# Patient Record
Sex: Female | Born: 1935
Health system: Southern US, Community
[De-identification: ages and names within clinical notes are randomized; demographics above are authoritative.]

## PROBLEM LIST (undated history)

## (undated) DIAGNOSIS — I10 Essential (primary) hypertension: Secondary | ICD-10-CM

## (undated) DIAGNOSIS — H269 Unspecified cataract: Secondary | ICD-10-CM

## (undated) DIAGNOSIS — J449 Chronic obstructive pulmonary disease, unspecified: Secondary | ICD-10-CM

## (undated) DIAGNOSIS — N904 Leukoplakia of vulva: Secondary | ICD-10-CM

## (undated) DIAGNOSIS — I82409 Acute embolism and thrombosis of unspecified deep veins of unspecified lower extremity: Secondary | ICD-10-CM

## (undated) DIAGNOSIS — C801 Malignant (primary) neoplasm, unspecified: Secondary | ICD-10-CM

## (undated) DIAGNOSIS — N183 Chronic kidney disease, stage 3 unspecified: Secondary | ICD-10-CM

## (undated) DIAGNOSIS — M1990 Unspecified osteoarthritis, unspecified site: Secondary | ICD-10-CM

## (undated) DIAGNOSIS — M353 Polymyalgia rheumatica: Secondary | ICD-10-CM

## (undated) DIAGNOSIS — C649 Malignant neoplasm of unspecified kidney, except renal pelvis: Secondary | ICD-10-CM

## (undated) DIAGNOSIS — F32A Depression, unspecified: Secondary | ICD-10-CM

## (undated) DIAGNOSIS — E559 Vitamin D deficiency, unspecified: Secondary | ICD-10-CM

## (undated) DIAGNOSIS — G629 Polyneuropathy, unspecified: Secondary | ICD-10-CM

## (undated) DIAGNOSIS — M8580 Other specified disorders of bone density and structure, unspecified site: Secondary | ICD-10-CM

## (undated) DIAGNOSIS — E785 Hyperlipidemia, unspecified: Secondary | ICD-10-CM

## (undated) DIAGNOSIS — G2581 Restless legs syndrome: Secondary | ICD-10-CM

## (undated) DIAGNOSIS — E119 Type 2 diabetes mellitus without complications: Secondary | ICD-10-CM

## (undated) DIAGNOSIS — F329 Major depressive disorder, single episode, unspecified: Secondary | ICD-10-CM

## (undated) DIAGNOSIS — L508 Other urticaria: Secondary | ICD-10-CM

## (undated) DIAGNOSIS — N903 Dysplasia of vulva, unspecified: Secondary | ICD-10-CM

## (undated) DIAGNOSIS — N289 Disorder of kidney and ureter, unspecified: Secondary | ICD-10-CM

## (undated) HISTORY — PX: NEPHRECTOMY: SHX65

## (undated) HISTORY — PX: ABDOMINAL HYSTERECTOMY: SHX81

## (undated) HISTORY — PX: JOINT REPLACEMENT: SHX530

## (undated) HISTORY — PX: CATARACT EXTRACTION: SUR2

## (undated) HISTORY — PX: LAMINOTOMY: SHX998

## (undated) HISTORY — PX: COLONOSCOPY: SHX174

## (undated) HISTORY — PX: TONSILLECTOMY: SUR1361

## (undated) HISTORY — PX: TEMPORAL ARTERY BIOPSY / LIGATION: SUR132

## (undated) HISTORY — PX: APPENDECTOMY: SHX54

---

## 2004-07-22 ENCOUNTER — Other Ambulatory Visit: Payer: Self-pay

## 2004-09-10 ENCOUNTER — Ambulatory Visit: Payer: Self-pay | Admitting: Oncology

## 2004-09-12 ENCOUNTER — Ambulatory Visit: Payer: Self-pay | Admitting: Oncology

## 2005-01-10 ENCOUNTER — Ambulatory Visit: Payer: Self-pay | Admitting: Internal Medicine

## 2005-03-13 ENCOUNTER — Ambulatory Visit: Payer: Self-pay | Admitting: Oncology

## 2005-06-04 ENCOUNTER — Emergency Department: Payer: Self-pay | Admitting: Internal Medicine

## 2005-09-11 ENCOUNTER — Ambulatory Visit: Payer: Self-pay | Admitting: Oncology

## 2005-09-25 ENCOUNTER — Ambulatory Visit: Payer: Self-pay | Admitting: Oncology

## 2006-09-08 ENCOUNTER — Ambulatory Visit: Payer: Self-pay | Admitting: Oncology

## 2006-09-16 ENCOUNTER — Ambulatory Visit: Payer: Self-pay | Admitting: Unknown Physician Specialty

## 2006-09-25 ENCOUNTER — Ambulatory Visit: Payer: Self-pay | Admitting: Oncology

## 2006-11-27 ENCOUNTER — Ambulatory Visit: Payer: Self-pay | Admitting: Internal Medicine

## 2007-02-17 ENCOUNTER — Ambulatory Visit: Payer: Self-pay | Admitting: Unknown Physician Specialty

## 2007-02-24 ENCOUNTER — Inpatient Hospital Stay: Payer: Self-pay | Admitting: Unknown Physician Specialty

## 2008-01-07 ENCOUNTER — Ambulatory Visit: Payer: Self-pay | Admitting: Internal Medicine

## 2009-01-09 ENCOUNTER — Ambulatory Visit: Payer: Self-pay | Admitting: Internal Medicine

## 2009-07-03 ENCOUNTER — Encounter: Payer: Self-pay | Admitting: Rheumatology

## 2009-07-26 ENCOUNTER — Encounter: Payer: Self-pay | Admitting: Rheumatology

## 2009-10-09 ENCOUNTER — Ambulatory Visit: Payer: Self-pay | Admitting: Unknown Physician Specialty

## 2009-10-12 ENCOUNTER — Ambulatory Visit: Payer: Self-pay | Admitting: Unknown Physician Specialty

## 2010-01-03 ENCOUNTER — Ambulatory Visit: Payer: Self-pay | Admitting: Anesthesiology

## 2010-01-12 ENCOUNTER — Ambulatory Visit: Payer: Self-pay | Admitting: Anesthesiology

## 2010-02-16 ENCOUNTER — Ambulatory Visit: Payer: Self-pay | Admitting: Anesthesiology

## 2010-03-28 ENCOUNTER — Ambulatory Visit: Payer: Self-pay | Admitting: Anesthesiology

## 2010-04-03 ENCOUNTER — Ambulatory Visit: Payer: Self-pay | Admitting: Internal Medicine

## 2010-08-31 ENCOUNTER — Ambulatory Visit: Payer: Self-pay | Admitting: Neurology

## 2010-09-19 ENCOUNTER — Inpatient Hospital Stay: Payer: Self-pay | Admitting: Internal Medicine

## 2010-10-02 ENCOUNTER — Ambulatory Visit: Payer: Self-pay | Admitting: Internal Medicine

## 2010-10-31 ENCOUNTER — Ambulatory Visit: Payer: Self-pay | Admitting: Specialist

## 2010-11-23 ENCOUNTER — Emergency Department: Payer: Self-pay | Admitting: Emergency Medicine

## 2010-11-27 ENCOUNTER — Ambulatory Visit: Payer: Self-pay | Admitting: Internal Medicine

## 2011-01-31 ENCOUNTER — Ambulatory Visit: Payer: Self-pay | Admitting: Internal Medicine

## 2011-06-26 ENCOUNTER — Ambulatory Visit: Payer: Self-pay | Admitting: Oncology

## 2011-07-17 ENCOUNTER — Ambulatory Visit: Payer: Self-pay | Admitting: Oncology

## 2011-07-27 ENCOUNTER — Ambulatory Visit: Payer: Self-pay | Admitting: Oncology

## 2011-08-05 ENCOUNTER — Ambulatory Visit (INDEPENDENT_AMBULATORY_CARE_PROVIDER_SITE_OTHER): Payer: Medicare Other | Admitting: Ophthalmology

## 2011-08-05 DIAGNOSIS — H3530 Unspecified macular degeneration: Secondary | ICD-10-CM

## 2011-08-05 DIAGNOSIS — H43819 Vitreous degeneration, unspecified eye: Secondary | ICD-10-CM

## 2011-08-05 DIAGNOSIS — H2510 Age-related nuclear cataract, unspecified eye: Secondary | ICD-10-CM

## 2011-08-05 DIAGNOSIS — H348392 Tributary (branch) retinal vein occlusion, unspecified eye, stable: Secondary | ICD-10-CM

## 2011-11-25 ENCOUNTER — Ambulatory Visit: Payer: Self-pay | Admitting: Unknown Physician Specialty

## 2011-12-09 ENCOUNTER — Inpatient Hospital Stay: Payer: Self-pay | Admitting: Unknown Physician Specialty

## 2011-12-09 LAB — HEMOGLOBIN: HGB: 11 g/dL — ABNORMAL LOW (ref 12.0–16.0)

## 2011-12-10 LAB — BASIC METABOLIC PANEL
Anion Gap: 9 (ref 7–16)
BUN: 18 mg/dL (ref 7–18)
Chloride: 106 mmol/L (ref 98–107)
Co2: 26 mmol/L (ref 21–32)
Creatinine: 1.23 mg/dL (ref 0.60–1.30)
EGFR (Non-African Amer.): 45 — ABNORMAL LOW
Glucose: 147 mg/dL — ABNORMAL HIGH (ref 65–99)
Potassium: 4.2 mmol/L (ref 3.5–5.1)
Sodium: 141 mmol/L (ref 136–145)

## 2011-12-10 LAB — HEMOGLOBIN: HGB: 9.3 g/dL — ABNORMAL LOW (ref 12.0–16.0)

## 2011-12-11 LAB — PATHOLOGY REPORT

## 2011-12-11 LAB — HEMOGLOBIN: HGB: 8.3 g/dL — ABNORMAL LOW (ref 12.0–16.0)

## 2011-12-11 LAB — PROTIME-INR
INR: 1.1
Prothrombin Time: 15 secs — ABNORMAL HIGH (ref 11.5–14.7)

## 2011-12-12 LAB — CBC WITH DIFFERENTIAL/PLATELET
Basophil #: 0.2 10*3/uL — ABNORMAL HIGH (ref 0.0–0.1)
Eosinophil #: 0.1 10*3/uL (ref 0.0–0.7)
HGB: 8.3 g/dL — ABNORMAL LOW (ref 12.0–16.0)
Lymphocyte #: 2.9 10*3/uL (ref 1.0–3.6)
Lymphocyte %: 20.1 %
MCH: 29.7 pg (ref 26.0–34.0)
MCV: 92 fL (ref 80–100)
Monocyte #: 1.3 10*3/uL — ABNORMAL HIGH (ref 0.0–0.7)
Monocyte %: 8.6 %
Neutrophil #: 10.1 10*3/uL — ABNORMAL HIGH (ref 1.4–6.5)
Neutrophil %: 69.2 %
Platelet: 179 10*3/uL (ref 150–440)
RBC: 2.78 10*6/uL — ABNORMAL LOW (ref 3.80–5.20)
WBC: 14.6 10*3/uL — ABNORMAL HIGH (ref 3.6–11.0)

## 2011-12-12 LAB — URINALYSIS, COMPLETE
Bilirubin,UR: NEGATIVE
Ketone: NEGATIVE
Ph: 5 (ref 4.5–8.0)
RBC,UR: 10 /HPF (ref 0–5)
Specific Gravity: 1.009 (ref 1.003–1.030)
Squamous Epithelial: 3
WBC UR: 1 /HPF (ref 0–5)

## 2011-12-12 LAB — BASIC METABOLIC PANEL
BUN: 23 mg/dL — ABNORMAL HIGH (ref 7–18)
Calcium, Total: 8.7 mg/dL (ref 8.5–10.1)
Chloride: 98 mmol/L (ref 98–107)
Creatinine: 1.24 mg/dL (ref 0.60–1.30)
EGFR (African American): 54 — ABNORMAL LOW
EGFR (Non-African Amer.): 44 — ABNORMAL LOW
Glucose: 120 mg/dL — ABNORMAL HIGH (ref 65–99)
Osmolality: 279 (ref 275–301)
Potassium: 4.5 mmol/L (ref 3.5–5.1)
Sodium: 137 mmol/L (ref 136–145)

## 2011-12-12 LAB — PROTIME-INR: Prothrombin Time: 14 secs (ref 11.5–14.7)

## 2011-12-13 LAB — URINE CULTURE

## 2011-12-13 LAB — PROTIME-INR: Prothrombin Time: 14.1 secs (ref 11.5–14.7)

## 2011-12-14 LAB — BASIC METABOLIC PANEL
Calcium, Total: 9.5 mg/dL (ref 8.5–10.1)
Chloride: 95 mmol/L — ABNORMAL LOW (ref 98–107)
Co2: 35 mmol/L — ABNORMAL HIGH (ref 21–32)
EGFR (Non-African Amer.): 45 — ABNORMAL LOW
Glucose: 125 mg/dL — ABNORMAL HIGH (ref 65–99)
Osmolality: 287 (ref 275–301)
Potassium: 4.1 mmol/L (ref 3.5–5.1)
Sodium: 140 mmol/L (ref 136–145)

## 2011-12-14 LAB — CBC WITH DIFFERENTIAL/PLATELET
Basophil #: 0.1 10*3/uL (ref 0.0–0.1)
Eosinophil #: 0.2 10*3/uL (ref 0.0–0.7)
HCT: 23.8 % — ABNORMAL LOW (ref 35.0–47.0)
HGB: 7.8 g/dL — ABNORMAL LOW (ref 12.0–16.0)
Lymphocyte #: 1.8 10*3/uL (ref 1.0–3.6)
Lymphocyte %: 22.4 %
MCV: 91 fL (ref 80–100)
Monocyte %: 12 %
Neutrophil #: 4.9 10*3/uL (ref 1.4–6.5)
RBC: 2.61 10*6/uL — ABNORMAL LOW (ref 3.80–5.20)
RDW: 15.5 % — ABNORMAL HIGH (ref 11.5–14.5)
WBC: 7.9 10*3/uL (ref 3.6–11.0)

## 2011-12-14 LAB — PROTIME-INR
INR: 1.1
Prothrombin Time: 15 secs — ABNORMAL HIGH (ref 11.5–14.7)

## 2011-12-14 LAB — HEMOGLOBIN: HGB: 9.4 g/dL — ABNORMAL LOW (ref 12.0–16.0)

## 2011-12-16 LAB — BASIC METABOLIC PANEL
Anion Gap: 7 (ref 7–16)
Calcium, Total: 10.4 mg/dL — ABNORMAL HIGH (ref 8.5–10.1)
Co2: 36 mmol/L — ABNORMAL HIGH (ref 21–32)
EGFR (African American): 55 — ABNORMAL LOW
Potassium: 4 mmol/L (ref 3.5–5.1)
Sodium: 139 mmol/L (ref 136–145)

## 2011-12-16 LAB — PROTIME-INR
INR: 1.8
Prothrombin Time: 20.9 secs — ABNORMAL HIGH (ref 11.5–14.7)

## 2011-12-17 LAB — PROTIME-INR
INR: 2
Prothrombin Time: 23 secs — ABNORMAL HIGH (ref 11.5–14.7)

## 2012-01-13 ENCOUNTER — Ambulatory Visit: Payer: Self-pay | Admitting: Internal Medicine

## 2012-02-03 ENCOUNTER — Ambulatory Visit (INDEPENDENT_AMBULATORY_CARE_PROVIDER_SITE_OTHER): Payer: Medicare Other | Admitting: Ophthalmology

## 2013-07-31 ENCOUNTER — Emergency Department: Payer: Self-pay | Admitting: Emergency Medicine

## 2013-07-31 LAB — CBC
MCH: 29.6 pg (ref 26.0–34.0)
MCHC: 32.6 g/dL (ref 32.0–36.0)
MCV: 91 fL (ref 80–100)
Platelet: 267 10*3/uL (ref 150–440)
RDW: 16.4 % — ABNORMAL HIGH (ref 11.5–14.5)
WBC: 12.1 10*3/uL — ABNORMAL HIGH (ref 3.6–11.0)

## 2013-07-31 LAB — URINALYSIS, COMPLETE
Glucose,UR: 50 mg/dL (ref 0–75)
Ketone: NEGATIVE
Leukocyte Esterase: NEGATIVE
Ph: 7 (ref 4.5–8.0)
Protein: 100
RBC,UR: 7854 /HPF (ref 0–5)
Specific Gravity: 1.012 (ref 1.003–1.030)
Squamous Epithelial: NONE SEEN

## 2013-07-31 LAB — COMPREHENSIVE METABOLIC PANEL
Albumin: 3.3 g/dL — ABNORMAL LOW (ref 3.4–5.0)
Alkaline Phosphatase: 93 U/L (ref 50–136)
Anion Gap: 3 — ABNORMAL LOW (ref 7–16)
BUN: 22 mg/dL — ABNORMAL HIGH (ref 7–18)
Calcium, Total: 9.2 mg/dL (ref 8.5–10.1)
Chloride: 98 mmol/L (ref 98–107)
Creatinine: 1.26 mg/dL (ref 0.60–1.30)
EGFR (Non-African Amer.): 40 — ABNORMAL LOW
Glucose: 125 mg/dL — ABNORMAL HIGH (ref 65–99)
Total Protein: 7.3 g/dL (ref 6.4–8.2)

## 2013-08-21 ENCOUNTER — Ambulatory Visit: Payer: Self-pay | Admitting: Neurology

## 2013-10-18 ENCOUNTER — Ambulatory Visit: Payer: Self-pay | Admitting: Internal Medicine

## 2014-02-13 ENCOUNTER — Emergency Department: Payer: Self-pay | Admitting: Emergency Medicine

## 2014-03-24 ENCOUNTER — Ambulatory Visit: Payer: Self-pay | Admitting: Urology

## 2014-09-10 ENCOUNTER — Emergency Department: Payer: Self-pay | Admitting: Emergency Medicine

## 2014-09-10 LAB — BASIC METABOLIC PANEL
Anion Gap: 8 (ref 7–16)
BUN: 23 mg/dL — ABNORMAL HIGH (ref 7–18)
Calcium, Total: 9.2 mg/dL (ref 8.5–10.1)
Chloride: 99 mmol/L (ref 98–107)
Co2: 36 mmol/L — ABNORMAL HIGH (ref 21–32)
Creatinine: 1.38 mg/dL — ABNORMAL HIGH (ref 0.60–1.30)
EGFR (African American): 47 — ABNORMAL LOW
EGFR (Non-African Amer.): 39 — ABNORMAL LOW
GLUCOSE: 101 mg/dL — AB (ref 65–99)
Osmolality: 289 (ref 275–301)
Potassium: 3.6 mmol/L (ref 3.5–5.1)
Sodium: 143 mmol/L (ref 136–145)

## 2014-09-10 LAB — URINALYSIS, COMPLETE
Bilirubin,UR: NEGATIVE
Glucose,UR: NEGATIVE mg/dL (ref 0–75)
KETONE: NEGATIVE
Nitrite: POSITIVE
PH: 5 (ref 4.5–8.0)
PROTEIN: NEGATIVE
RBC,UR: 255 /HPF (ref 0–5)
SPECIFIC GRAVITY: 1.013 (ref 1.003–1.030)

## 2014-09-10 LAB — CBC WITH DIFFERENTIAL/PLATELET
BASOS PCT: 0.3 %
Basophil #: 0.1 10*3/uL (ref 0.0–0.1)
EOS ABS: 0.1 10*3/uL (ref 0.0–0.7)
Eosinophil %: 0.6 %
HCT: 36.7 % (ref 35.0–47.0)
HGB: 11.4 g/dL — AB (ref 12.0–16.0)
Lymphocyte #: 1.2 10*3/uL (ref 1.0–3.6)
Lymphocyte %: 7.8 %
MCH: 28.8 pg (ref 26.0–34.0)
MCHC: 31.1 g/dL — ABNORMAL LOW (ref 32.0–36.0)
MCV: 93 fL (ref 80–100)
MONO ABS: 0.9 x10 3/mm (ref 0.2–0.9)
Monocyte %: 6 %
Neutrophil #: 13.4 10*3/uL — ABNORMAL HIGH (ref 1.4–6.5)
Neutrophil %: 85.3 %
PLATELETS: 331 10*3/uL (ref 150–440)
RBC: 3.97 10*6/uL (ref 3.80–5.20)
RDW: 17.3 % — AB (ref 11.5–14.5)
WBC: 15.7 10*3/uL — AB (ref 3.6–11.0)

## 2014-09-12 LAB — URINE CULTURE

## 2014-12-08 DIAGNOSIS — R700 Elevated erythrocyte sedimentation rate: Secondary | ICD-10-CM | POA: Diagnosis not present

## 2015-01-02 DIAGNOSIS — N183 Chronic kidney disease, stage 3 (moderate): Secondary | ICD-10-CM | POA: Diagnosis not present

## 2015-01-02 DIAGNOSIS — E1122 Type 2 diabetes mellitus with diabetic chronic kidney disease: Secondary | ICD-10-CM | POA: Diagnosis not present

## 2015-01-02 DIAGNOSIS — F341 Dysthymic disorder: Secondary | ICD-10-CM | POA: Diagnosis not present

## 2015-01-02 DIAGNOSIS — I10 Essential (primary) hypertension: Secondary | ICD-10-CM | POA: Diagnosis not present

## 2015-01-12 DIAGNOSIS — Z79899 Other long term (current) drug therapy: Secondary | ICD-10-CM | POA: Diagnosis not present

## 2015-01-12 DIAGNOSIS — M25512 Pain in left shoulder: Secondary | ICD-10-CM | POA: Diagnosis not present

## 2015-01-12 DIAGNOSIS — N183 Chronic kidney disease, stage 3 (moderate): Secondary | ICD-10-CM | POA: Diagnosis not present

## 2015-01-12 DIAGNOSIS — E782 Mixed hyperlipidemia: Secondary | ICD-10-CM | POA: Diagnosis not present

## 2015-01-12 DIAGNOSIS — R700 Elevated erythrocyte sedimentation rate: Secondary | ICD-10-CM | POA: Diagnosis not present

## 2015-01-12 DIAGNOSIS — E559 Vitamin D deficiency, unspecified: Secondary | ICD-10-CM | POA: Diagnosis not present

## 2015-01-12 DIAGNOSIS — M1990 Unspecified osteoarthritis, unspecified site: Secondary | ICD-10-CM | POA: Diagnosis not present

## 2015-01-12 DIAGNOSIS — G8929 Other chronic pain: Secondary | ICD-10-CM | POA: Diagnosis not present

## 2015-01-12 DIAGNOSIS — E1122 Type 2 diabetes mellitus with diabetic chronic kidney disease: Secondary | ICD-10-CM | POA: Diagnosis not present

## 2015-01-17 DIAGNOSIS — M810 Age-related osteoporosis without current pathological fracture: Secondary | ICD-10-CM | POA: Diagnosis not present

## 2015-01-30 DIAGNOSIS — H3561 Retinal hemorrhage, right eye: Secondary | ICD-10-CM | POA: Diagnosis not present

## 2015-01-30 DIAGNOSIS — H3532 Exudative age-related macular degeneration: Secondary | ICD-10-CM | POA: Diagnosis not present

## 2015-01-30 DIAGNOSIS — E119 Type 2 diabetes mellitus without complications: Secondary | ICD-10-CM | POA: Diagnosis not present

## 2015-02-06 DIAGNOSIS — H3532 Exudative age-related macular degeneration: Secondary | ICD-10-CM | POA: Diagnosis not present

## 2015-02-14 DIAGNOSIS — R700 Elevated erythrocyte sedimentation rate: Secondary | ICD-10-CM | POA: Diagnosis not present

## 2015-03-19 NOTE — Consult Note (Signed)
PATIENT NAME:  Tracy Mcmahon, Tracy Mcmahon MR#:  [id number] DATE OF BIRTH:  1929/08/06  DATE OF CONSULTATION:  12/12/2011  REFERRING PHYSICIAN:  Kathrene Alu., MD   CONSULTING PHYSICIAN:  Cashtyn Pouliot A. Posey Pronto, MD  PRIMARY CARE PHYSICIAN: Christena Flake. Raechel Ache, MD    REASON FOR CONSULTATION: Fever and lethargy.   HISTORY OF PRESENT ILLNESS: Tracy Mcmahon is an 81 year old Caucasian female who was admitted on the Orthopedic Service on 12/09/2011 for an elective right hip replacement secondary to severe degenerative joint disease. Today is the patient's postoperative day three. She started having low-grade to high-grade fever since yesterday evening. She has been a little lethargic given her fever. Her chest x-ray is negative. She has been having some cough which is nonproductive. The patient denies any abdominal pain. The patient denies any abdominal pain or chest pain, denies any shortness of breath. She usually wears oxygen at home, suspected from her sleep apnea.   PAST MEDICAL/SURGICAL HISTORY:  1. History of polymyalgia rheumatica.  2. History of kidney cancer, status post left nephrectomy.  3. Hypertension.  4. Left total knee replacement in October 2011.  5. Left hip replacement.  6. Tonsillectomy.  7. Appendectomy. 8. Neck surgery  9. Total hysterectomy in 1974.  10. Hemorrhoidectomy.  11. History of depression.   ALLERGIES: Codeine, morphine, and novocaine.   FAMILY HISTORY: Positive for hypertension.    CURRENT MEDICATIONS:  1. Norco 5/325, 1 p.o. every 3 hours p.r.n.  2. Atenolol 25 mg b.i.d.  3. Citalopram 10 mg daily.  4. Docusate 240 mg daily.  5. Furosemide 40 mg daily.  6. Gabapentin 300 mg t.i.d.  7. Hydrochlorothiazide 12.5 mg p.o. daily.  8. Dilaudid injection 1 to 2 mg IM every 3 hours p.r.n.  9. Zofran 4 mg IV every 6 hours p.r.n.  10. Prednisone 5 mg daily.  11. Calcium vitamin D 200 mg p.o. b.i.d.  12. Warfarin 5 mg at 5:00 p.m.  13. Alprazolam 1 mg t.i.d. p.r.n.   14. Lovenox 30 mg daily.  15. Tylenol Extra Strength 1000 mg every 4 hours p.r.n.  16. Levaquin   .   REVIEW OF SYSTEMS:  CONSTITUTIONAL: Positive for fever, fatigue, and lethargy. EYES: No blurred or double vision. ENT: No tinnitus, ear pain, hearing loss. RESPIRATORY: Positive for cough, nonproductive, with some shortness of breath. CARDIOVASCULAR: No chest pain, no orthopnea or dyspnea on exertion. GI: No nausea, vomiting, diarrhea, or abdominal pain. GU: No dysuria or hematuria. ENDOCRINE: No polyuria or nocturia. HEMATOLOGY: No anemia or easy bruising. SKIN: No acne or rash. MUSCULOSKELETAL: No arthritis. NEUROLOGICAL: No cerebrovascular accident or transient ischemic attack. PSYCHIATRIC: No anxiety or depression. All other systems reviewed and negative.   PHYSICAL EXAMINATION:  GENERAL: The patient is somewhat lethargic although awakens and answers all questions appropriately, morbidly obese.   VITAL SIGNS: She has fever of 102, pulse is 106 to 108, blood pressure is 115/57, saturations are 91% to 96% on 2 liters.   HEENT: Head: Atraumatic, normocephalic. Eyes: Pupils are equal, round, and reactive to light and accommodation. Extraocular movements intact. Oral mucosa is moist.   NECK: Supple. No JVD. No carotid bruit.   RESPIRATORY: Decreased breath sounds in the bases. Coarse breath sounds in the upper airway around the upper part of the chest. No wheezing or use of accessory muscles.   ABDOMEN: Soft, benign, and nontender. No organomegaly. Positive bowel sounds.   CARDIOVASCULAR: Both the heart sounds are normal. Tachycardia present. No murmur heard. PMI is not lateralized.  Chest nontender.   EXTREMITIES: Good pedal pulses, good femoral pulses. I was not able to obtain femoral pulses given the patient's habitus. She is morbidly obese.   LABORATORY, DIAGNOSTIC AND RADIOLOGICAL DATA:   Chest x-ray on 12/12/2011: No acute disease of the chest.  PT-INR is 14 and 1.0.  White count is  14.6, hemoglobin and hematocrit is 8.3 and 25.6, platelet count is 179, MCV is 92.  Glucose is 120, BUN is 23, creatinine is 1.20, sodium is 137, potassium is 4.5, chloride is 98, bicarbonate is 32, calcium is 8.7.  Pathology results of the right femoral head showed degenerative arthritis.   ASSESSMENT/PLAN: Tracy Mcmahon is an 81 year old with:  1. High-grade fever, postoperative day three: The patient's chest x-ray is negative. She has some cough with possible acute bronchitis. There is also the possibility of a urinary tract infection. The patient is prone and has had history of recurrent UTIs in the past. There was no initial urinalysis sent; hence, we will send a urinalysis and urine culture. I will start the patient on empiric Levaquin for now which will cover both respiratory and urinary tract infection bugs, bacterial organisms. Check blood cultures and urine cultures. Tylenol as needed for fever.  2. Shortness of breath: The patient appears stable at present, saturations are 91% to 96% on 2 liters. 3. CT of the chest as ordered by primary team: I agree with it. Saturations appears stable at present. She wars oxygen at home.  4. History of deep venous thrombosis, chronic: Resumed warfarin since yesterday.  5. Right hip elective replacement due to severe degenerative joint disease: This is postoperative day three.   The above plan was discussed with the patient and the patient's daughter, who was present in the room.  We will follow the patient along with you.    Thank you for the consult.   TIME SPENT: 40 minutes.  ____________________________ Hart Rochester Posey Pronto, MD sap:cbb D: 12/12/2011 11:40:49 ET T: 12/12/2011 11:59:08 ET JOB#: [id number]  cc: Jason Frisbee A. Posey Pronto, MD, <Dictator> Christena Flake. Raechel Ache, MD Kathrene Alu., MD Ilda Basset MD ELECTRONICALLY SIGNED 12/20/2011 7:29

## 2015-03-19 NOTE — Op Note (Signed)
PATIENT NAME:  Tracy Mcmahon, Tracy Mcmahon MR#:  [id number] DATE OF BIRTH:  [birth year]  DATE OF PROCEDURE:  12/09/2011  PREOPERATIVE DIAGNOSIS: Severe degenerative arthritis, right hip.   POSTOPERATIVE DIAGNOSIS: Severe degenerative arthritis, right hip.   PROCEDURE: Stryker total hip replacement on the right.   SURGEON: Kathrene Alu., MD  FIRST ASSISTANT: Dr. Skip Estimable  SECOND ASSISTANT: Dorthula Matas, PA-C.   ANESTHESIA: Spinal.   HISTORY: Patient had a long history of severe degenerative arthritis of her right hip. It progressed to the point that she was not functional due to her pain. She was brought in for a total hip replacement on the right. She had had a remote left total replacement which had performed well.   DESCRIPTION OF PROCEDURE: Patient taken to the Operating Room where satisfactory spinal anesthesia was achieved. Following this the patient was turned to the lateral decubitus position with the right hip up. The right hip was prepped and draped in the usual fashion for a procedure about the hip. Patient incidentally was given 2 grams Kefzol IV prior to the start of the procedure.   Next, a slightly curved posterolateral incision made. It was centered over the posterior aspect of the greater trochanter. I went ahead and dissected down through the subcutaneous tissue onto the gluteus maximus fascia and fascia lata. They were divided in line with the incision. A Charnley retractor was inserted into the depths of the wound. Care was taken to protect the sciatic nerve. The piriformis was divided at its attachment to the greater trochanter and the remaining external rotators were divided at their attachment to the greater trochanter and reflected over the sciatic nerve.   The capsule was divided in a T-shaped fashion. A posterosuperior capsulectomy was performed. Next I inserted a Steinmann pin through a puncture wound into the ilium. It was bent at right angles so it could be used  as a leg length measurement tool.   I then dislocated the hip. The femoral head was somewhat foreshortened and ovoid in nature. It was devoid of any normal articular cartilage.   I went ahead and osteotomized the femoral neck a little less than a fingerbreadth above the lesser trochanter. The correct angle for the neck cut was determined by the use of the Accolade 2 neck cutting guide.   Next, I went ahead and exposed the acetabulum. The acetabulum was reamed to accommodate a 54 mm acetabular shell.   The shell was placed into the acetabulum at about 45 degrees of abduction and about 10 to 15 degrees of forward flexion. Two cancellus screws were inserted through the shell to help stabilize it. Trial liner was inserted.   Next, I went ahead and reamed and broached the proximal femur to accommodate a 3.5 Accolade proximal femoral component. With the 3.5 trial in place a trial reduction was performed. We used a 35 mm neck length and a femoral head that was 40 mm in diameter with a -4 mm offset. Once the trial head was reduced in the acetabulum it seemed to move well and seemed to be quite stable.   I did detect some motion in the cup. I dissected out the posterior wall of the acetabulum. It was obvious that a fracture had occurred in the acetabulum. Consequently, Dr. Marry Guan, one of my partners, was notified. He assisted me in placing a 10-hole recon plate over the posterior aspect of the acetabulum. It was anchored into the ilium and the ischial tuberosity. It gave good stabilizing  support to the cup.   Some cancellus bone graft and bone paste was then applied under and around the recon plate.   Next, I went ahead and removed the trial liner and removed the trial femoral component. The permanent 40 mm 0 degree polyethylene insert was impacted into the acetabular shell and then I impacted the 3.5 Accolade proximal femoral component into the proximal femur. The neck angle on the Accolade was 127 degrees.    Next, I impacted the 40 mm femoral head with a -4 mm offset onto the proximal femoral component and reduced it into the acetabulum. Again it moved well and was quite stable.   The wound was irrigated with GU irrigant at this time.   I went ahead and reattached the piriformis back to the greater trochanter through drill holes. I used #2 Orthocord suture. Two Hemovacs were inserted in the depths of the wound. The gluteus maximus fascia and fascia lata were then closed with #1 Ethibond and #1 Vicryl sutures and the subcutaneous was closed with #1, 0 and 2-0 Vicryl and the skin with skin staples. The smooth Steinmann pin was removed and the puncture wound was closed with a 3-0 nylon suture. I then applied Betadine to the puncture wound and skin incision and then a sterile dressing was applied along with four TENS pads. An abduction pillow was placed between the patient's legs and then she was turned supine and awakened. She was transferred to her hospital bed. She was taken to the recovery room in satisfactory condition. Blood loss was about 1600 mL. Two units of blood was transfused during the course of the procedure. Patient also received a gram of Kefzol towards the end of the procedure.   SUMMARY OF IMPLANTS USED: 54 mm acetabular shell, a 40 mm 0 degree polyethylene insert, a 3.5 Accolade stem with 127 degrees neck angle. I also used two 6.5 mm cancellus bone screws along with a 10 hole reconstruction plate. Four cortical screws were used with the recon plate.   ____________________________ Kathrene Alu., MD hbk:cms D: 12/09/2011 22:19:50 ET T: 12/10/2011 11:23:30 ET JOB#: [id number]  cc: Kathrene Alu., MD, <Dictator> Vilinda Flake, JR MD ELECTRONICALLY SIGNED 12/22/2011 15:30

## 2015-03-19 NOTE — Discharge Summary (Signed)
PATIENT NAME:  Tracy Mcmahon, Tracy Mcmahon MR#:  [id number] DATE OF BIRTH:  [birth year]  DATE OF ADMISSION:  12/09/2011 DATE OF DISCHARGE:  12/17/2011  ADMITTING DIAGNOSIS: Severe degenerative arthritis of the right hip.   DISCHARGE DIAGNOSIS: Severe degenerative arthritis of the right hip.  OPERATION: On 12/09/2011 the patient had a Stryker total hip replacement on the right.   SURGEON: Kathrene Alu., MD   FIRST ASSISTANT: Skip Estimable, MD   SECOND ASSISTANT: Dorthula Matas, PA-C    ANESTHESIA: Spinal.   IMPLANTS USED: 54 mm acetabular shell, 40 mm 0 degree polyethylene insert, 3.5 Accolade stem with 127 degree neck angle, also was used two 6.5 mm cancellus bone screws along with a 10 hole reconstruction plate with four cortical screws used on the reconstructive plate.  COMPLICATIONS: There was a posterior wall acetabular fracture that occurred with application of a 10 hole reconstruction plate within the posterior acetabulum anchored with four screws.   ESTIMATED BLOOD LOSS: Blood loss was 1600 mL with 2 units of blood transfused during the course of the procedure.   The patient was given 1 gram of Kefzol prior to the end of the procedure. The patient was stabilized and brought to the recovery room.   HISTORY: The patient is an 81 year old female who presented for persistent right hip discomfort. The patient had severe degenerative arthritis and had increasing difficulty with ambulation activities. The patient was set up for surgery. She was on Coumadin but this was stopped five days before surgery.   PHYSICAL EXAMINATION: GENERAL: Well developed, well nourished female in no apparent distress. Normal affect. Difficulty with transfers and ambulation. HEART: Regular rate and rhythm. LUNGS: Clear to auscultation. MUSCULOSKELETAL: In regard to the right lower extremity, the patient had groin tenderness with discomfort with hip motion. The patient had 90 degrees of flexion with extension very  limited. The patient had abduction and rotation with pain that was limited. X-rays revealed severe degenerative joint space loss involving superior aspect of the hip and acetabulum.   HOSPITAL COURSE: After initial admission on 12/09/2011, the patient was brought to the orthopedic floor. On January 17th,  the patient was evaluated by Medicine because of persistent high fever on postop day three. A chest x-ray was negative. She was placed on p.o. Levaquin which stabilized her fever and she was followed and cleared medically by 12/16/2011. The patient after surgery had a hemoglobin of 9.3 on postop day one that dropped down to 8.3 on postop day two and remained stable there. Then on postop day five, hemoglobin on January 19th was 7.8 where she received 1 unit of transfused blood and increased her hemoglobin to 9.4 and remained stable there before discharge. The patient did have a normal urine culture and urinalysis on January 17th, although she was placed on Levaquin because of fever. The patient also had her prothrombin times evaluated and it increased to 1.8 the day before discharge.   CONDITION AT DISCHARGE: Stable.   DISPOSITION: The patient was sent to skilled nursing.   DISCHARGE INSTRUCTIONS:  1. The patient will follow-up with [facility] on 12/24/2011 with Dr. Jefm Bryant.  2. The patient will work with physical therapy doing toe-touch weight-bear with a walker.  3. The patient will use oxygen per nasal cannula if her sats drop below 92%.  4. Diet is Ensure 240 mL p.o. b.i.d. and a regular diet.  5. The patient will have nursing use a TENS unit to control pain and spasms. Will use total hip  protocol and do a dressing change on a p.r.n. basis.   DISCHARGE MEDICATIONS:  1. Tylenol Extra-Strength 1000 mg p.o. q.4 hours p.r.n. for pain or fever greater than 100.4.  2. Norco 5/325 1 to 2 tablets every three hours p.r.n. for severe pain.  3. Alprazolam 1 mg p.o. q.8 hours p.r.n. for  anxiety. 4. Maalox suspension 50 mL p.o. q.6 hours p.r.n.  5. Atenolol 25 mg p.o. b.i.d.  6. Dulcolax suppository 10 mg PR on a p.r.n. basis for constipation. 7. Os-Cal 500 with Vitamin D 1 tablet p.o. b.i.d.  8. Celexa 10 mg p.o. daily.  9. Surfak 240 mg p.o. daily.  10. Lovenox 30 mg sub-Q daily x21 days and discontinue  11. Lasix 40 mg p.o. daily. 12. Neurontin 300 mg p.o. t.i.d.  13. Hydrochlorothiazide 12.5 mg p.o. daily  14. Levaquin 500 mg p.o. daily for five days and discontinue. 15. Prednisone 5 mg p.o. daily  16. Coumadin 7.5 mg p.o. q.5 p.m.    ____________________________ J. Reche Dixon, [place] jtm:drc D: 12/17/2011 07:15:59 ET T: 12/17/2011 07:36:18 ET JOB#: [id number]  cc: J. Reche Dixon, [place], <Dictator> J Daphyne Miguez [facility] PA ELECTRONICALLY SIGNED 12/18/2011 7:23

## 2015-03-19 NOTE — Consult Note (Signed)
PCp Dr Evette Doffing Fever , high grade, post op day 3negativesome cough possible acute bronchitis vs UTI (ua to be sent)levaquin for nowBC,uc SOBchest ordered by primary teamappears stab;le-wears oxygen at home DVT, chornicwarfarin since y'day right hip elecative repalcement due to severe DJD pt and dtr     Electronic Signatures: Ilda Basset (MD) (Signed on 17-Jan-13 11:31)  Authored   Last Updated: 17-Jan-13 11:40 by Ilda Basset (MD)

## 2015-03-21 DIAGNOSIS — H3532 Exudative age-related macular degeneration: Secondary | ICD-10-CM | POA: Diagnosis not present

## 2015-03-29 ENCOUNTER — Inpatient Hospital Stay
Admission: EM | Admit: 2015-03-29 | Discharge: 2015-03-30 | DRG: 189 | Disposition: A | Discharge status: Home / Self Care | Payer: Commercial Managed Care - HMO | Attending: Internal Medicine | Admitting: Internal Medicine

## 2015-03-29 ENCOUNTER — Encounter: Payer: Self-pay | Admitting: *Deleted

## 2015-03-29 ENCOUNTER — Emergency Department: Payer: Commercial Managed Care - HMO

## 2015-03-29 DIAGNOSIS — Z7952 Long term (current) use of systemic steroids: Secondary | ICD-10-CM

## 2015-03-29 DIAGNOSIS — Z7982 Long term (current) use of aspirin: Secondary | ICD-10-CM

## 2015-03-29 DIAGNOSIS — Z8553 Personal history of malignant neoplasm of renal pelvis: Secondary | ICD-10-CM

## 2015-03-29 DIAGNOSIS — I248 Other forms of acute ischemic heart disease: Secondary | ICD-10-CM | POA: Diagnosis not present

## 2015-03-29 DIAGNOSIS — N183 Chronic kidney disease, stage 3 unspecified: Secondary | ICD-10-CM

## 2015-03-29 DIAGNOSIS — J441 Chronic obstructive pulmonary disease with (acute) exacerbation: Secondary | ICD-10-CM | POA: Diagnosis not present

## 2015-03-29 DIAGNOSIS — N904 Leukoplakia of vulva: Secondary | ICD-10-CM | POA: Diagnosis present

## 2015-03-29 DIAGNOSIS — E559 Vitamin D deficiency, unspecified: Secondary | ICD-10-CM | POA: Diagnosis not present

## 2015-03-29 DIAGNOSIS — G2581 Restless legs syndrome: Secondary | ICD-10-CM | POA: Diagnosis present

## 2015-03-29 DIAGNOSIS — J9601 Acute respiratory failure with hypoxia: Principal | ICD-10-CM | POA: Diagnosis present

## 2015-03-29 DIAGNOSIS — Z515 Encounter for palliative care: Secondary | ICD-10-CM | POA: Diagnosis not present

## 2015-03-29 DIAGNOSIS — Z79899 Other long term (current) drug therapy: Secondary | ICD-10-CM | POA: Diagnosis not present

## 2015-03-29 DIAGNOSIS — G629 Polyneuropathy, unspecified: Secondary | ICD-10-CM | POA: Diagnosis present

## 2015-03-29 DIAGNOSIS — I82409 Acute embolism and thrombosis of unspecified deep veins of unspecified lower extremity: Secondary | ICD-10-CM

## 2015-03-29 DIAGNOSIS — R0602 Shortness of breath: Secondary | ICD-10-CM

## 2015-03-29 DIAGNOSIS — R778 Other specified abnormalities of plasma proteins: Secondary | ICD-10-CM

## 2015-03-29 DIAGNOSIS — M1990 Unspecified osteoarthritis, unspecified site: Secondary | ICD-10-CM | POA: Diagnosis present

## 2015-03-29 DIAGNOSIS — Z794 Long term (current) use of insulin: Secondary | ICD-10-CM | POA: Diagnosis not present

## 2015-03-29 DIAGNOSIS — L509 Urticaria, unspecified: Secondary | ICD-10-CM | POA: Diagnosis present

## 2015-03-29 DIAGNOSIS — E119 Type 2 diabetes mellitus without complications: Secondary | ICD-10-CM

## 2015-03-29 DIAGNOSIS — E785 Hyperlipidemia, unspecified: Secondary | ICD-10-CM

## 2015-03-29 DIAGNOSIS — Z888 Allergy status to other drugs, medicaments and biological substances status: Secondary | ICD-10-CM | POA: Diagnosis not present

## 2015-03-29 DIAGNOSIS — I129 Hypertensive chronic kidney disease with stage 1 through stage 4 chronic kidney disease, or unspecified chronic kidney disease: Secondary | ICD-10-CM | POA: Diagnosis present

## 2015-03-29 DIAGNOSIS — F329 Major depressive disorder, single episode, unspecified: Secondary | ICD-10-CM | POA: Diagnosis present

## 2015-03-29 DIAGNOSIS — R748 Abnormal levels of other serum enzymes: Secondary | ICD-10-CM | POA: Diagnosis not present

## 2015-03-29 DIAGNOSIS — R7989 Other specified abnormal findings of blood chemistry: Secondary | ICD-10-CM

## 2015-03-29 DIAGNOSIS — M353 Polymyalgia rheumatica: Secondary | ICD-10-CM | POA: Diagnosis present

## 2015-03-29 DIAGNOSIS — Z86718 Personal history of other venous thrombosis and embolism: Secondary | ICD-10-CM

## 2015-03-29 DIAGNOSIS — I10 Essential (primary) hypertension: Secondary | ICD-10-CM | POA: Diagnosis not present

## 2015-03-29 DIAGNOSIS — M8580 Other specified disorders of bone density and structure, unspecified site: Secondary | ICD-10-CM | POA: Diagnosis present

## 2015-03-29 DIAGNOSIS — D495 Neoplasm of unspecified behavior of other genitourinary organs: Secondary | ICD-10-CM | POA: Diagnosis present

## 2015-03-29 DIAGNOSIS — J449 Chronic obstructive pulmonary disease, unspecified: Secondary | ICD-10-CM | POA: Diagnosis not present

## 2015-03-29 DIAGNOSIS — F32A Depression, unspecified: Secondary | ICD-10-CM

## 2015-03-29 HISTORY — DX: Vitamin D deficiency, unspecified: E55.9

## 2015-03-29 HISTORY — DX: Chronic kidney disease, stage 3 (moderate): N18.3

## 2015-03-29 HISTORY — DX: Major depressive disorder, single episode, unspecified: F32.9

## 2015-03-29 HISTORY — DX: Other specified disorders of bone density and structure, unspecified site: M85.80

## 2015-03-29 HISTORY — DX: Polyneuropathy, unspecified: G62.9

## 2015-03-29 HISTORY — DX: Acute embolism and thrombosis of unspecified deep veins of unspecified lower extremity: I82.409

## 2015-03-29 HISTORY — DX: Other urticaria: L50.8

## 2015-03-29 HISTORY — DX: Type 2 diabetes mellitus without complications: E11.9

## 2015-03-29 HISTORY — DX: Polymyalgia rheumatica: M35.3

## 2015-03-29 HISTORY — DX: Malignant neoplasm of unspecified kidney, except renal pelvis: C64.9

## 2015-03-29 HISTORY — DX: Essential (primary) hypertension: I10

## 2015-03-29 HISTORY — DX: Unspecified cataract: H26.9

## 2015-03-29 HISTORY — DX: Unspecified osteoarthritis, unspecified site: M19.90

## 2015-03-29 HISTORY — DX: Restless legs syndrome: G25.81

## 2015-03-29 HISTORY — DX: Depression, unspecified: F32.A

## 2015-03-29 HISTORY — DX: Chronic obstructive pulmonary disease, unspecified: J44.9

## 2015-03-29 HISTORY — DX: Leukoplakia of vulva: N90.4

## 2015-03-29 HISTORY — DX: Dysplasia of vulva, unspecified: N90.3

## 2015-03-29 HISTORY — DX: Chronic kidney disease, stage 3 unspecified: N18.30

## 2015-03-29 HISTORY — DX: Hyperlipidemia, unspecified: E78.5

## 2015-03-29 HISTORY — DX: Malignant (primary) neoplasm, unspecified: C80.1

## 2015-03-29 LAB — CBC
HCT: 39.9 % (ref 35.0–47.0)
HCT: 40.6 % (ref 35.0–47.0)
HEMOGLOBIN: 13 g/dL (ref 12.0–16.0)
HEMOGLOBIN: 13.2 g/dL (ref 12.0–16.0)
MCH: 31.8 pg (ref 26.0–34.0)
MCH: 31.5 pg (ref 26.0–34.0)
MCHC: 32.5 g/dL (ref 32.0–36.0)
MCHC: 32.6 g/dL (ref 32.0–36.0)
MCV: 97.6 fL (ref 80.0–100.0)
MCV: 96.5 fL (ref 80.0–100.0)
PLATELETS: 139 10*3/uL — AB (ref 150–440)
Platelets: 124 10*3/uL — ABNORMAL LOW (ref 150–440)
RBC: 4.08 MIL/uL (ref 3.80–5.20)
RBC: 4.21 MIL/uL (ref 3.80–5.20)
RDW: 16.3 % — AB (ref 11.5–14.5)
RDW: 16.4 % — ABNORMAL HIGH (ref 11.5–14.5)
WBC: 10.8 10*3/uL (ref 3.6–11.0)
WBC: 10.7 10*3/uL (ref 3.6–11.0)

## 2015-03-29 LAB — BASIC METABOLIC PANEL
Anion gap: 10 (ref 5–15)
BUN: 24 mg/dL — AB (ref 6–20)
CALCIUM: 9.4 mg/dL (ref 8.9–10.3)
CHLORIDE: 100 mmol/L — AB (ref 101–111)
CO2: 32 mmol/L (ref 22–32)
Creatinine, Ser: 1.35 mg/dL — ABNORMAL HIGH (ref 0.44–1.00)
GFR calc Af Amer: 41 mL/min — ABNORMAL LOW (ref >60)
GFR calc non Af Amer: 35 mL/min — ABNORMAL LOW (ref >60)
GLUCOSE: 171 mg/dL — AB (ref 65–99)
Potassium: 4.7 mmol/L (ref 3.5–5.1)
Sodium: 142 mmol/L (ref 135–145)

## 2015-03-29 LAB — TROPONIN I
Troponin I: 0.07 ng/mL — ABNORMAL HIGH (ref <0.031)
Troponin I: 0.07 ng/mL — ABNORMAL HIGH (ref <0.031)

## 2015-03-29 LAB — GLUCOSE, CAPILLARY
GLUCOSE-CAPILLARY: 192 mg/dL — AB (ref 70–99)
Glucose-Capillary: 171 mg/dL — ABNORMAL HIGH (ref 70–99)

## 2015-03-29 LAB — CREATININE, SERUM
Creatinine, Ser: 1.41 mg/dL — ABNORMAL HIGH (ref 0.44–1.00)
GFR calc non Af Amer: 33 mL/min — ABNORMAL LOW (ref >60)
GFR, EST AFRICAN AMERICAN: 39 mL/min — AB (ref >60)

## 2015-03-29 MED ORDER — METHYLPREDNISOLONE SODIUM SUCC 125 MG IJ SOLR
INTRAMUSCULAR | Status: AC
  Administered 2015-03-29: 125 mg via INTRAVENOUS
  Filled 2015-03-29: qty 2

## 2015-03-29 MED ORDER — IPRATROPIUM-ALBUTEROL 0.5-2.5 (3) MG/3ML IN SOLN
RESPIRATORY_TRACT | Status: AC
  Administered 2015-03-29: 3 mL via RESPIRATORY_TRACT
  Filled 2015-03-29: qty 3

## 2015-03-29 MED ORDER — ASPIRIN EC 325 MG PO TBEC
325.0000 mg | DELAYED_RELEASE_TABLET | Freq: Every day | ORAL | Status: DC
  Administered 2015-03-30: 325 mg via ORAL
  Filled 2015-03-29: qty 1

## 2015-03-29 MED ORDER — ACETAMINOPHEN 650 MG RE SUPP: 650.0000 mg | Freq: Four times a day (QID) | RECTAL | Status: DC | PRN

## 2015-03-29 MED ORDER — GABAPENTIN 300 MG PO CAPS
300.0000 mg | ORAL_CAPSULE | Freq: Two times a day (BID) | ORAL | Status: DC
  Administered 2015-03-30: 300 mg via ORAL
  Filled 2015-03-29: qty 1

## 2015-03-29 MED ORDER — HEPARIN SODIUM (PORCINE) 5000 UNIT/ML IJ SOLN
5000.0000 [IU] | Freq: Three times a day (TID) | INTRAMUSCULAR | Status: DC
  Administered 2015-03-29 – 2015-03-30 (×2): 5000 [IU] via SUBCUTANEOUS
  Filled 2015-03-29 (×2): qty 1

## 2015-03-29 MED ORDER — ALBUTEROL SULFATE (2.5 MG/3ML) 0.083% IN NEBU: 2.5000 mg | INHALATION_SOLUTION | RESPIRATORY_TRACT | Status: DC | PRN

## 2015-03-29 MED ORDER — IPRATROPIUM-ALBUTEROL 0.5-2.5 (3) MG/3ML IN SOLN
3.0000 mL | RESPIRATORY_TRACT | Status: DC
Start: 2015-03-29 — End: 2015-03-30
  Administered 2015-03-30 (×4): 3 mL via RESPIRATORY_TRACT
  Filled 2015-03-29 (×5): qty 3

## 2015-03-29 MED ORDER — SODIUM CHLORIDE 0.9 % IJ SOLN
3.0000 mL | Freq: Two times a day (BID) | INTRAMUSCULAR | Status: DC
  Administered 2015-03-29: 3 mL via INTRAVENOUS

## 2015-03-29 MED ORDER — IPRATROPIUM-ALBUTEROL 0.5-2.5 (3) MG/3ML IN SOLN
3.0000 mL | Freq: Once | RESPIRATORY_TRACT | Status: AC
  Administered 2015-03-29: 3 mL via RESPIRATORY_TRACT

## 2015-03-29 MED ORDER — HYDROCHLOROTHIAZIDE 25 MG PO TABS
12.5000 mg | ORAL_TABLET | Freq: Every day | ORAL | Status: DC
  Administered 2015-03-29: 23:00:00 via ORAL
  Administered 2015-03-30: 12.5 mg via ORAL
  Filled 2015-03-29 (×2): qty 1

## 2015-03-29 MED ORDER — METHYLPREDNISOLONE SODIUM SUCC 125 MG IJ SOLR
125.0000 mg | Freq: Once | INTRAMUSCULAR | Status: AC
  Administered 2015-03-29: 125 mg via INTRAVENOUS

## 2015-03-29 MED ORDER — INSULIN GLARGINE 100 UNIT/ML ~~LOC~~ SOLN
70.0000 [IU] | Freq: Every day | SUBCUTANEOUS | Status: DC
  Administered 2015-03-29: 70 [IU] via SUBCUTANEOUS
  Filled 2015-03-29 (×2): qty 0.7

## 2015-03-29 MED ORDER — LEVOFLOXACIN IN D5W 250 MG/50ML IV SOLN
250.0000 mg | INTRAVENOUS | Status: DC
  Filled 2015-03-29: qty 50

## 2015-03-29 MED ORDER — ASPIRIN 81 MG PO CHEW
CHEWABLE_TABLET | ORAL | Status: AC
  Administered 2015-03-29: 324 mg via ORAL
  Filled 2015-03-29: qty 4

## 2015-03-29 MED ORDER — ATENOLOL 25 MG PO TABS
25.0000 mg | ORAL_TABLET | Freq: Two times a day (BID) | ORAL | Status: DC
  Administered 2015-03-29 – 2015-03-30 (×2): 25 mg via ORAL
  Filled 2015-03-29 (×2): qty 1

## 2015-03-29 MED ORDER — ACETAMINOPHEN 325 MG PO TABS: 650.0000 mg | ORAL_TABLET | Freq: Four times a day (QID) | ORAL | Status: DC | PRN

## 2015-03-29 MED ORDER — GABAPENTIN 300 MG PO CAPS
600.0000 mg | ORAL_CAPSULE | Freq: Every day | ORAL | Status: DC
  Administered 2015-03-29: 600 mg via ORAL
  Filled 2015-03-29: qty 2

## 2015-03-29 MED ORDER — INSULIN ASPART 100 UNIT/ML ~~LOC~~ SOLN
0.0000 [IU] | Freq: Three times a day (TID) | SUBCUTANEOUS | Status: DC
  Administered 2015-03-30: 3 [IU] via SUBCUTANEOUS
  Filled 2015-03-29: qty 3

## 2015-03-29 MED ORDER — ONDANSETRON HCL 4 MG PO TABS: 4.0000 mg | ORAL_TABLET | Freq: Four times a day (QID) | ORAL | Status: DC | PRN

## 2015-03-29 MED ORDER — FLUOXETINE HCL 20 MG PO CAPS
20.0000 mg | ORAL_CAPSULE | Freq: Every day | ORAL | Status: DC
  Administered 2015-03-29 – 2015-03-30 (×2): 20 mg via ORAL
  Filled 2015-03-29 (×2): qty 1

## 2015-03-29 MED ORDER — PRAVASTATIN SODIUM 20 MG PO TABS
20.0000 mg | ORAL_TABLET | Freq: Every day | ORAL | Status: DC
  Administered 2015-03-29 – 2015-03-30 (×2): 20 mg via ORAL
  Filled 2015-03-29 (×2): qty 1

## 2015-03-29 MED ORDER — VITAMIN B-12 1000 MCG PO TABS
1000.0000 ug | ORAL_TABLET | Freq: Every day | ORAL | Status: DC
  Administered 2015-03-29 – 2015-03-30 (×2): 1000 ug via ORAL
  Filled 2015-03-29 (×2): qty 1

## 2015-03-29 MED ORDER — ASPIRIN 81 MG PO CHEW
324.0000 mg | CHEWABLE_TABLET | Freq: Once | ORAL | Status: AC
  Administered 2015-03-29: 324 mg via ORAL

## 2015-03-29 MED ORDER — ONDANSETRON HCL 4 MG/2ML IJ SOLN: 4.0000 mg | Freq: Four times a day (QID) | INTRAMUSCULAR | Status: DC | PRN

## 2015-03-29 MED ORDER — LEVOFLOXACIN IN D5W 500 MG/100ML IV SOLN
500.0000 mg | Freq: Once | INTRAVENOUS | Status: AC
Start: 2015-03-29 — End: 2015-03-30
  Administered 2015-03-30: 500 mg via INTRAVENOUS
  Filled 2015-03-29 (×2): qty 100

## 2015-03-29 MED ORDER — METHYLPREDNISOLONE SODIUM SUCC 125 MG IJ SOLR
60.0000 mg | Freq: Three times a day (TID) | INTRAMUSCULAR | Status: DC
  Administered 2015-03-29: 60 mg via INTRAVENOUS
  Administered 2015-03-30: 05:00:00 via INTRAVENOUS
  Filled 2015-03-29 (×2): qty 2

## 2015-03-29 NOTE — Progress Notes (Signed)
ANTIBIOTIC CONSULT NOTE - INITIAL  Pharmacy Consult for levofloxacin  Indication: COPD Rxacerbation  Allergies  Allergen Reactions  . Fosamax [Alendronate Sodium] Other (See Comments)    Reaction:  Made pt "very sick"  . Wellbutrin [Bupropion] Other (See Comments)    Reaction:  Unknown    Patient Measurements: Height: 5' (152.4 cm) Weight: 202 lb 1.6 oz (91.672 kg) IBW/kg (Calculated) : 45.5 Adjusted Body Weight: 64 kg  Vital Signs: Temp: 98 F (36.7 C) (05/04 2131) Temp Source: Oral (05/04 2131) BP: 143/71 mmHg (05/04 2131) Pulse Rate: 90 (05/04 2131)  Labs:  Recent Labs  03/29/15 1607 03/29/15 2128  WBC 10.8 10.7  HGB 13.0 13.2  PLT 124* 139*  CREATININE 1.35* 1.41*   Estimated Creatinine Clearance: 30.5 mL/min (by C-G formula based on Cr of 1.41).    Microbiology: No results found for this or any previous visit (from the past 720 hour(s)).  Medical History: Past Medical History  Diagnosis Date  . Hypertension   . Cancer   . Diabetes mellitus without complication   . Cataract   . COPD (chronic obstructive pulmonary disease)   . Chronic kidney disease (CKD), stage III (moderate)   . Vitamin D deficiency   . Hyperlipemia   . Osteopenia   . Osteoarthritis   . Polyneuropathy   . Restless leg syndrome   . Renal cancer   . DVT (deep venous thrombosis)     left leg  . Depression   . Polymyalgia rheumatica   . Vulvar intraepithelial neoplasia with lichen sclerosus   . Chronic urticaria       Goal of Therapy:  Appropriate antibiotic dosing for renal function; eradication of infection  Plan:  Will dose levofloxacin 500 mg IV x1 then transition patient to levofloxacin 250 mg IV q24h.   Royetta Asal, PharmD  03/29/2015,11:32 PM

## 2015-03-29 NOTE — H&P (Signed)
[facility] [name]    MR#:  [id number]  DATE OF BIRTH:  11/13/1929  DATE OF ADMISSION:  03/29/2015  PRIMARY CARE PHYSICIAN: No primary care provider on file.   REQUESTING/REFERRING PHYSICIAN: Dr.Goodman  CHIEF COMPLAINT:  Shortness of breath  HISTORY OF PRESENT ILLNESS:  Tracy Mcmahon  is a 81 y.o. female with a known history of COPD, hypertension, diabetes mellitus, chronic kidney disease and hyperlipidemia is presenting to the ED with a chief complaint of shortness of breath since this a.m. Patient was in her usual state of health until last night. Patient uses oxygen at home as needed basis. Denies any chest pain or cough. Denies any fever either. Patient's shortness of breath has been progressively getting worse and patient went to the clinic and received a 1 breathing treatment still has she was hypoxemic with oxygen sats at 70 and she  is brought into the ED. Patient was given more debilitated treatments and salmeterol 1.5 mg IV was given in the ED. She denies any chest pain but initial troponin is elevated to 0.07. She sees Dr. Ubaldo Glassing as an outpatient. Patient's shortness of breath was better after breathing treatments and IV Solu-Medrol  PAST MEDICAL HISTORY:   Past Medical History  Diagnosis Date  . Hypertension   . Cancer   . Diabetes mellitus without complication   . Cataract   . COPD (chronic obstructive pulmonary disease)   . Chronic kidney disease (CKD), stage III (moderate)   . Vitamin D deficiency   . Hyperlipemia   . Osteopenia   . Osteoarthritis   . Polyneuropathy   . Restless leg syndrome   . Renal cancer   . DVT (deep venous thrombosis)     left leg  . Depression   . Polymyalgia rheumatica   . Vulvar intraepithelial neoplasia with lichen sclerosus   . Chronic urticaria     PAST SURGICAL HISTOIRY:   Past Surgical History  Procedure Laterality Date  . Appendectomy    . Tonsillectomy     . Abdominal hysterectomy    . Cataract extraction    . Nephrectomy    . Temporal artery biopsy / ligation    . Joint replacement Bilateral     hip  . Laminotomy    . Colonoscopy      SOCIAL HISTORY:   History  Substance Use Topics  . Smoking status: Former Research scientist (life sciences)  . Smokeless tobacco: Not on file  . Alcohol Use: 0.0 oz/week    0 Standard drinks or equivalent per week    FAMILY HISTORY:   Family History  Problem Relation Age of Onset  . Hypertension    . Diabetes      DRUG ALLERGIES:   Allergies  Allergen Reactions  . Fosamax [Alendronate Sodium] Other (See Comments)    Reaction:  Made pt "very sick"  . Wellbutrin [Bupropion] Other (See Comments)    Reaction:  Unknown    REVIEW OF SYSTEMS:  CONSTITUTIONAL: No fever, fatigue or weakness.  EYES: No blurred or double vision.  EARS, NOSE, AND THROAT: No tinnitus or ear pain.  RESPIRATORY: No cough,  positive shortness of breath and  wheezing , denies hemoptysis.  CARDIOVASCULAR: No chest pain, orthopnea, edema.  GASTROINTESTINAL: No nausea, vomiting, diarrhea or abdominal pain.  GENITOURINARY: No dysuria, hematuria.  ENDOCRINE: No polyuria, nocturia,  HEMATOLOGY: No anemia, easy bruising or bleeding SKIN: No rash or lesion. MUSCULOSKELETAL: No joint pain or arthritis.  NEUROLOGIC: No tingling, numbness, weakness.  PSYCHIATRY: No anxiety or depression.   MEDICATIONS AT HOME:   Prior to Admission medications   Medication Sig Start Date End Date Taking? Authorizing Provider  aspirin EC 81 MG tablet Take 81 mg by mouth daily.   Yes Historical Provider, MD  atenolol (TENORMIN) 25 MG tablet Take 25 mg by mouth 2 (two) times daily.   Yes Historical Provider, MD  Calcium Carb-Cholecalciferol (CALCIUM 600 + D PO) Take 1 tablet by mouth daily.   Yes Historical Provider, MD  FLUoxetine (PROZAC) 20 MG capsule Take 20 mg by mouth daily.   Yes Historical Provider, MD  gabapentin (NEURONTIN) 300 MG capsule Take 300-600 mg  by mouth 3 (three) times daily. Pt takes one in the morning, one in the evening, and two at bedtime.   Yes Historical Provider, MD  hydrochlorothiazide (HYDRODIURIL) 25 MG tablet Take 12.5 mg by mouth daily.   Yes Historical Provider, MD  insulin aspart (NOVOLOG) 100 UNIT/ML injection Inject 0-10 Units into the skin See admin instructions. Pt uses as needed per sliding scale.   Yes Historical Provider, MD  insulin glargine (LANTUS) 100 UNIT/ML injection Inject 70 Units into the skin daily.   Yes Historical Provider, MD  pravastatin (PRAVACHOL) 20 MG tablet Take 20 mg by mouth daily.   Yes Historical Provider, MD  predniSONE (DELTASONE) 5 MG tablet Take 10 mg by mouth daily.   Yes Historical Provider, MD  vitamin B-12 (CYANOCOBALAMIN) 1000 MCG tablet Take 1,000 mcg by mouth daily.   Yes Historical Provider, MD  Vitamin D, Cholecalciferol, 1000 UNITS TABS Take 1 tablet by mouth daily.   Yes Historical Provider, MD  Vitamin D, Ergocalciferol, (DRISDOL) 50000 UNITS CAPS capsule Take 50,000 Units by mouth every 7 (seven) days.   Yes Historical Provider, MD      VITAL SIGNS:  Blood pressure 154/77, pulse 82, temperature 98.4 F (36.9 C), temperature source Oral, resp. rate 20, height 5' (1.524 m), weight 92.9 kg (204 lb 12.9 oz), SpO2 94 %.  PHYSICAL EXAMINATION:  GENERAL:  81 y.o.-year-old patient lying in the bed with no acute distress.  EYES: Pupils equal, round, reactive to light and accommodation. No scleral icterus. Extraocular muscles intact.  HEENT: Head atraumatic, normocephalic. Oropharynx and nasopharynx clear.  NECK:  Supple, no jugular venous distention. No thyroid enlargement, no tenderness.  LUNGS: Moderate eating tea with decreased breath sounds at the bases, end expiratory  wheezing, rales,rhonchi or crepitation. No use of accessory muscles of respiration.  CARDIOVASCULAR: S1, S2 normal. No murmurs, rubs, or gallops.  ABDOMEN: Soft, nontender, nondistended. Bowel sounds present. No  organomegaly or mass.  EXTREMITIES: No pedal edema, cyanosis, or clubbing.  NEUROLOGIC: Cranial nerves II through XII are intact. Muscle strength 5/5 in all extremities. Sensation intact. Gait not checked.  PSYCHIATRIC: The patient is alert and oriented x 3.  SKIN: No obvious rash, lesion, or ulcer.   LABORATORY PANEL:   CBC  Recent Labs Lab 03/29/15 1607  WBC 10.8  HGB 13.0  HCT 39.9  PLT 124*   ------------------------------------------------------------------------------------------------------------------  Chemistries   Recent Labs Lab 03/29/15 1607  NA 142  K 4.7  CL 100*  CO2 32  GLUCOSE 171*  BUN 24*  CREATININE 1.35*  CALCIUM 9.4   ------------------------------------------------------------------------------------------------------------------  Cardiac Enzymes  Recent Labs Lab 03/29/15 1607  TROPONINI 0.07*   12-lead EKG with normal sinus rhythm, left axis deviation. Nonspecific intraventricular block ------------------------------------------------------------------------------------------------------------------  RADIOLOGY:  Dg Chest 2 View  03/29/2015  CLINICAL DATA:  Shortness of breath for 1 day  EXAM: CHEST  2 VIEW  COMPARISON:  09/10/2014  FINDINGS: Cardiac shadow is mildly enlarged. The lungs are well aerated bilaterally. Minimal scarring is noted in the bases bilaterally but stable. Mild hyperinflation is again seen also stable from the prior exam.  IMPRESSION: COPD without acute abnormality.  Mild bibasilar scarring is noted.   Electronically Signed   By: Inez Catalina M.D.   On: 03/29/2015 18:15    EKG:   Orders placed or performed during the hospital encounter of 03/29/15  . ED EKG  (if patient has PMH of COPD)  . ED EKG  (if patient has PMH of COPD)    IMPRESSION AND PLAN:   #1 acute hypoxic respiratory failure secondary to acute exacerbation of COPD-related with the patient to telemetry. Will continue IV Solu-Medrol 60 mg IV every 6  hours. We will provide her with nebulizer treatments with duo nebs and we will provide albuterol neb l treatments 4 hours as needed basis. Will consult Dr. Raul Del as patient sees him as an outpatient. We will provide her IV levofloxacin   #2 elevated troponin probably from demand ischemia. We will cycle cardiac biomarkers. We will put a consult to Dr. Ubaldo Glassing, cardiology. No acute ST-T wave changes were noticed on the EKG   #3 history of chronic kidney disease stage II-will continue close monitoring of the renal function. Avoid nephrotoxins. If the renal function is getting worse will consider nephrology consult. #4 hypertension -continue home medications atenolol and hydrochlorothiazide and up titrate as needed basis #5 insulin requiring diabetes mellitus-continue patient on home medications Lantus 70 units subcutaneously every at bedtime and provide moderate dose sliding scale to cover steroid  induced hyperglycemia #6 history of DVT of left leg-currently not on any anticoagulation #7 History of depression denies any active depression right now   Will provide DVT prophylaxis with heparin subcutaneous The diagnoses and plan of care was discussed in detail with the patient and her 3 daughters at bedside and they all verbalized understanding of the plan     All the records are reviewed and case discussed with ED provider. Management plans discussed with the patient, family and they are in agreement.  CODE STATUS: Full code, younger daughter Ms. Tracy Mcmahon is the healthcare power of attorney  TOTAL TIME TAKING CARE OF THIS PATIENT: 50  minutes.    Nicholes Mango M.D on 03/29/2015 at 8:39 PM  Between 7am to 6pm - Pager - [phone]  After 6pm go to [email or web address] - password [email or web address] [facility]  [facility]  Office  [phone]  CC: Primary care physician; No primary care provider on file.

## 2015-03-29 NOTE — ED Notes (Signed)
Per EMS report, patient was transported here from [facility] in [place]. Pt was seen at the [facility] [place] for shortness of breath. Staff there obtained O2 sats in the 70's which improved to the mid-80's after an Albuterol neb treatment. EMS administered one Duo neb treatment and placed patient on 2L O2 for sats in the 80's. Pt. Is on prn O2 at home and states she did not wear any last night or today. Pt has a hx of COPD.

## 2015-03-29 NOTE — ED Provider Notes (Signed)
[facility] Emergency Department Provider Note    ____________________________________________  Time seen: 1650  I have reviewed the triage vital signs and the nursing notes.   HISTORY  Chief Complaint Shortness of Breath   History limited by: Not Limited   HPI Tracy Mcmahon is a 81 y.o. female Who presents to the emergency department today from [place] [facility] because of shortness of breath. The patient states she has a history of COPD. She has been having increasing gradually worsening shortness of breath that started yesterday. Patient has some associated cough. She states she also has some associated chest tightness. She denies any fevers. She denies any positional change to her shortness of breath. She does have home oxygen that she uses when necessary and started trying to use it right before her appointment today. At the clinic she received 1 DuoNeb treatment. Initial O2 sats were in the 70s at the clinic.     Past Medical History  Diagnosis Date  . Hypertension   . Cancer   . Diabetes mellitus without complication   . Cataract   . COPD (chronic obstructive pulmonary disease)   . Chronic kidney disease (CKD), stage III (moderate)   . Vitamin D deficiency   . Hyperlipemia   . Osteopenia   . Osteoarthritis   . Polyneuropathy   . Restless leg syndrome   . Renal cancer   . DVT (deep venous thrombosis)     left leg  . Depression   . Polymyalgia rheumatica   . Vulvar intraepithelial neoplasia with lichen sclerosus   . Chronic urticaria     There are no active problems to display for this patient.   Past Surgical History  Procedure Laterality Date  . Appendectomy    . Tonsillectomy    . Abdominal hysterectomy    . Cataract extraction    . Nephrectomy    . Temporal artery biopsy / ligation    . Joint replacement Bilateral     hip  . Laminotomy    . Colonoscopy      No current outpatient prescriptions on  file.  Allergies Fosamax and Wellbutrin  No family history on file.  Social History History  Substance Use Topics  . Smoking status: Never Smoker   . Smokeless tobacco: Not on file  . Alcohol Use: Yes    Review of Systems  Constitutional: Negative for fever. Cardiovascular: Negative for chest pain.positive for chest tightness Respiratory: positive for shortness of breath. Gastrointestinal: Negative for abdominal pain, vomiting and diarrhea. Genitourinary: Negative for dysuria. Musculoskeletal: Negative for back pain. Skin: Negative for rash. Neurological: Negative for headaches, focal weakness or numbness.   10-point ROS otherwise negative.  ____________________________________________   PHYSICAL EXAM:  VITAL SIGNS: ED Triage Vitals  Enc Vitals Group     BP 03/29/15 1537 160/78 mmHg     Pulse Rate 03/29/15 1537 88     Resp --      Temp 03/29/15 1537 97.8 F (36.6 C)     Temp Source 03/29/15 1537 Oral     SpO2 03/29/15 1537 94 %     Weight 03/29/15 1537 204 lb 12.9 oz (92.9 kg)     Height 03/29/15 1537 5' (1.524 m)     Head Cir --      Peak Flow --      Pain Score 03/29/15 1540 0     Pain Loc --      Pain Edu? --      Excl. in  GC? --    Constitutional: Alert and oriented. Well appearing and in no distress. Eyes: Conjunctivae are normal. PERRL. Normal extraocular movements. ENT   Head: Normocephalic and atraumatic.   Nose: No congestion/rhinnorhea.   Mouth/Throat: Mucous membranes are moist.   Neck: No stridor. Hematological/Lymphatic/Immunilogical: No cervical lymphadenopathy. Cardiovascular: Normal rate, regular rhythm.  No murmurs, rubs, or gallops. Respiratory: Normal respiratory effort without tachypnea nor retractions.mild diffuse bilateral wheezing. Mild crackles bilateral bases. Gastrointestinal: Soft and nontender. No distention.  Genitourinary: Deferred Musculoskeletal: Normal range of motion in all extremities. No joint effusions.   No lower extremity tenderness nor edema. Neurologic:  Normal speech and language. No gross focal neurologic deficits are appreciated. Speech is normal. No gait instability. Skin:  Skin is warm, dry and intact. No rash noted. Psychiatric: Mood and affect are normal. Speech and behavior are normal. Patient exhibits appropriate insight and judgment.  ____________________________________________    LABS (pertinent positives/negatives)  Labs Reviewed  GLUCOSE, CAPILLARY - Abnormal; Notable for the following:    Glucose-Capillary 171 (*)    All other components within normal limits  BASIC METABOLIC PANEL - Abnormal; Notable for the following:    Chloride 100 (*)    Glucose, Bld 171 (*)    BUN 24 (*)    Creatinine, Ser 1.35 (*)    GFR calc non Af Amer 35 (*)    GFR calc Af Amer 41 (*)    All other components within normal limits  CBC - Abnormal; Notable for the following:    RDW 16.3 (*)    Platelets 124 (*)    All other components within normal limits  TROPONIN I - Abnormal; Notable for the following:    Troponin I 0.07 (*)    All other components within normal limits     ____________________________________________   EKG  EKG Time: 1542 Rate: 86 Rhythm: normal sinus rhythm Axis: left axis deviation Intervals: QTC 490 QRS: nonspecific intraventricular block ST changes: no ST elevation    ____________________________________________    RADIOLOGY   IMPRESSION: COPD without acute abnormality. Mild bibasilar scarring is noted.  ____________________________________________   PROCEDURES  Procedure(s) performed: None  Critical Care performed: No  ____________________________________________   INITIAL IMPRESSION / ASSESSMENT AND PLAN / ED COURSE  Pertinent labs & imaging results that were available during my care of the patient were reviewed by me and considered in my medical decision making (see chart for details).  Asian presented with shortness of  breath for 2 days. On exam she did have some mild wheezing as well as some crackles. Initial O2 sats were in the 70s per report however has improved after neb treatment. Will give more neb treatments and Solu-Medrol.  ----------------------------------------- 6:53 PM on 03/29/2015 -----------------------------------------  Troponin was elevated. Patient denies any history of heart disease. Unfortunately no previous strips are available in the system that I can find. Have ordered aspirin. Will admit for further cardiac workup and continued respiratory treatment.  ____________________________________________   FINAL CLINICAL IMPRESSION(S) / ED DIAGNOSES  Final diagnoses:  None     Nance Pear, MD 03/29/15 [phone]

## 2015-03-30 ENCOUNTER — Encounter: Payer: Self-pay | Admitting: Internal Medicine

## 2015-03-30 LAB — HEMOGLOBIN A1C: Hgb A1c MFr Bld: 7 % — ABNORMAL HIGH (ref 4.0–6.0)

## 2015-03-30 LAB — BASIC METABOLIC PANEL
Anion gap: 11 (ref 5–15)
BUN: 28 mg/dL — AB (ref 6–20)
CO2: 32 mmol/L (ref 22–32)
Calcium: 8.9 mg/dL (ref 8.9–10.3)
Chloride: 98 mmol/L — ABNORMAL LOW (ref 101–111)
Creatinine, Ser: 1.38 mg/dL — ABNORMAL HIGH (ref 0.44–1.00)
GFR calc Af Amer: 40 mL/min — ABNORMAL LOW (ref >60)
GFR, EST NON AFRICAN AMERICAN: 34 mL/min — AB (ref >60)
GLUCOSE: 254 mg/dL — AB (ref 65–99)
POTASSIUM: 4.4 mmol/L (ref 3.5–5.1)
Sodium: 141 mmol/L (ref 135–145)

## 2015-03-30 LAB — CBC
HEMATOCRIT: 38.5 % (ref 35.0–47.0)
HEMOGLOBIN: 12.3 g/dL (ref 12.0–16.0)
MCH: 30.9 pg (ref 26.0–34.0)
MCHC: 32 g/dL (ref 32.0–36.0)
MCV: 96.6 fL (ref 80.0–100.0)
Platelets: 135 10*3/uL — ABNORMAL LOW (ref 150–440)
RBC: 3.98 MIL/uL (ref 3.80–5.20)
RDW: 16.4 % — ABNORMAL HIGH (ref 11.5–14.5)
WBC: 7.5 10*3/uL (ref 3.6–11.0)

## 2015-03-30 LAB — TROPONIN I: Troponin I: 0.06 ng/mL — ABNORMAL HIGH (ref <0.031)

## 2015-03-30 LAB — GLUCOSE, CAPILLARY: Glucose-Capillary: 193 mg/dL — ABNORMAL HIGH (ref 70–99)

## 2015-03-30 MED ORDER — LEVOFLOXACIN 250 MG PO TABS: 250.0000 mg | ORAL_TABLET | Freq: Every day | ORAL | Status: DC

## 2015-03-30 MED ORDER — PREDNISONE 5 MG PO TABS: ORAL_TABLET | ORAL | Status: DC

## 2015-03-30 NOTE — Progress Notes (Signed)
Pt was admitted with c/o SOB, treated in ED with solu-medrol, and breathing treatment.Pt lives at home with children, alert and verbally responsive, denies pain or discomfort. Iv abt therapy initiated for COPD Exacerbation,oxygen via [place] remain in place. Pt resting quietly in bed at this time ,no visible sign of distress noted, call light with reach.

## 2015-03-30 NOTE — Progress Notes (Signed)
patinet seen and examined and note is to follow

## 2015-03-30 NOTE — Discharge Summary (Signed)
Tracy Mcmahon at [place] [name]    MR#:  [id number]  DATE OF BIRTH:  04/09/1929  DATE OF ADMISSION:  03/29/2015 ADMITTING PHYSICIAN: Nicholes Mango, MD  DATE OF DISCHARGE: 03/30/2015  PRIMARY CARE PHYSICIAN: Dr. Dorthula Perfect   ADMISSION DIAGNOSIS:  Shortness of breath [R06.02] Troponin level elevated [R79.89]  DISCHARGE DIAGNOSIS:  Principal Problem:   COPD with acute exacerbation Active Problems:   Hypertension   Diabetes   Hyperlipidemia   Depression   Restless leg syndrome   DVT (deep venous thrombosis)   Chronic kidney disease   SECONDARY DIAGNOSIS:   Past Medical History  Diagnosis Date  . Hypertension   . Cancer   . Diabetes mellitus without complication   . Cataract   . COPD (chronic obstructive pulmonary disease)   . Chronic kidney disease (CKD), stage III (moderate)   . Vitamin D deficiency   . Hyperlipemia   . Osteopenia   . Osteoarthritis   . Polyneuropathy   . Restless leg syndrome   . Renal cancer   . DVT (deep venous thrombosis)     left leg  . Depression   . Polymyalgia rheumatica   . Vulvar intraepithelial neoplasia with lichen sclerosus   . Chronic urticaria     HOSPITAL COURSE:  This is an 81 year old female with a history of COPD, hypertension, diabetes who presents with shortness of breath. For further details please refer to H&P.  #1 acute hypoxic respiratory failure secondary to acute exacerbation of COPD: Patient was admitted to the hospice service and treated for COPD exacerbation with IV Solu-Medrol as well DuoNeb's. Patient is actually doing quite well. On examination at discharge her lungs are clear to AUSCULTATION without any wheezing or rales. She is on her baseline oxygen of 3 L. 2. Elevated troponin in the setting of problem #1 this is demand ischemia and not acute coronary syndrome. Her troponins were flat. She did not require cardiology consultation. 3. History of chronic kidney  disease stage II: Patient's cranial remained stable. 4. Hypertension patient will continue on her outpatient medications including atenolol and HCTZ. 5. Insulin requiring diabetes patient continue on her outpatient medications. She'll need close follow-up with her primary care physician in regards her diabetes. 6. History of DVT of left leg patient's currently not on anti-coagulation. Apparently this was more than 1 year ago.      DISCHARGE CONDITIONS AND DIET:   Stable ADA diet and low sodium  CONSULTS OBTAINED:    DRUG ALLERGIES:   Allergies  Allergen Reactions  . Fosamax [Alendronate Sodium] Other (See Comments)    Reaction:  Made pt "very sick"  . Wellbutrin [Bupropion] Other (See Comments)    Reaction:  Unknown    DISCHARGE MEDICATIONS:   Current Discharge Medication List    START taking these medications   Details  levofloxacin (LEVAQUIN) 250 MG tablet Take 1 tablet (250 mg total) by mouth daily. Qty: 5 tablet, Refills: 0    !! predniSONE (DELTASONE) 5 MG tablet Label  & dispense according to the schedule below. 10 Pills PO for 3 days then, 8 Pills PO for 3 days, 6 Pills PO for 3 days, 4 Pills PO for 3 days, 2 Pills PO daily as previously prescribed Qty: 30 tablet, Refills: 0     !! - Potential duplicate medications found. Please discuss with provider.    CONTINUE these medications which have NOT CHANGED   Details  aspirin EC 81 MG tablet Take  81 mg by mouth daily.    atenolol (TENORMIN) 25 MG tablet Take 25 mg by mouth 2 (two) times daily.    Calcium Carb-Cholecalciferol (CALCIUM 600 + D PO) Take 1 tablet by mouth daily.    FLUoxetine (PROZAC) 20 MG capsule Take 20 mg by mouth daily.    gabapentin (NEURONTIN) 300 MG capsule Take 300-600 mg by mouth 3 (three) times daily. Pt takes one in the morning, one in the evening, and two at bedtime.    hydrochlorothiazide (HYDRODIURIL) 25 MG tablet Take 12.5 mg by mouth daily.    insulin aspart (NOVOLOG) 100 UNIT/ML  injection Inject 0-10 Units into the skin See admin instructions. Pt uses as needed per sliding scale.    insulin glargine (LANTUS) 100 UNIT/ML injection Inject 70 Units into the skin daily.    pravastatin (PRAVACHOL) 20 MG tablet Take 20 mg by mouth daily.    !! predniSONE (DELTASONE) 5 MG tablet Take 10 mg by mouth daily.    vitamin B-12 (CYANOCOBALAMIN) 1000 MCG tablet Take 1,000 mcg by mouth daily.    Vitamin D, Cholecalciferol, 1000 UNITS TABS Take 1 tablet by mouth daily.    Vitamin D, Ergocalciferol, (DRISDOL) 50000 UNITS CAPS capsule Take 50,000 Units by mouth every 7 (seven) days.     !! - Potential duplicate medications found. Please discuss with provider.       DISCHARGE INSTRUCTIONS:    If you experience worsening of your admission symptoms, develop shortness of breath, life threatening emergency, suicidal or homicidal thoughts you must seek medical attention immediately by calling 911 or calling your MD immediately  if symptoms less severe.  You Must read complete instructions/literature along with all the possible adverse reactions/side effects for all the Medicines you take and that have been prescribed to you. Take any new Medicines after you have completely understood and accept all the possible adverse reactions/side effects.   Please note  You were cared for by a hospitalist during your hospital stay. If you have any questions about your discharge medications or the care you received while you were in the hospital after you are discharged, you can call the unit and asked to speak with the hospitalist on call if the hospitalist that took care of you is not available. Once you are discharged, your primary care physician will handle any further medical issues. Please note that NO REFILLS for any discharge medications will be authorized once you are discharged, as it is imperative that you return to your primary care physician (or establish a relationship with a primary  care physician if you do not have one) for your aftercare needs so that they can reassess your need for medications and monitor your lab values.    Today   CHIEF COMPLAINT:  Patient is feeling well today. She is on her baseline oxygen. She is not complaining shortness of breath.   VITAL SIGNS:  Blood pressure 136/84, pulse 78, temperature 98.1 F (36.7 C), temperature source Oral, resp. rate 18, height 5' (1.524 m), weight 91.944 kg (202 lb 11.2 oz), SpO2 91 %.   PHYSICAL EXAMINATION:  GENERAL:  81 y.o.-year-old patient lying in the bed with no acute distress.  NECK:  Supple, no jugular venous distention. No thyroid enlargement, no tenderness.  LUNGS: Normal breath sounds bilaterally, no wheezing, rales,rhonchi  No use of accessory muscles of respiration.  CARDIOVASCULAR: S1, S2 normal. 2/6 systolic ejection murmur ABDOMEN: Soft, non-tender, non-distended. Bowel sounds present. No organomegaly or mass.  EXTREMITIES: No pedal  edema, cyanosis, or clubbing.  PSYCHIATRIC: The patient is alert and oriented x 3.  SKIN: No obvious rash, lesion, or ulcer.   DATA REVIEW:   CBC  Recent Labs Lab 03/30/15 0300  WBC 7.5  HGB 12.3  HCT 38.5  PLT 135*    Chemistries   Recent Labs Lab 03/30/15 0300  NA 141  K 4.4  CL 98*  CO2 32  GLUCOSE 254*  BUN 28*  CREATININE 1.38*  CALCIUM 8.9    Cardiac Enzymes  Recent Labs Lab 03/29/15 1607 03/29/15 2128 03/30/15 0300  TROPONINI 0.07* 0.07* 0.06*    Microbiology Results                          RADIOLOGY:  Dg Chest 2 View  03/29/2015   CLINICAL DATA:  Shortness of breath for 1 day  EXAM: CHEST  2 VIEW  COMPARISON:  09/10/2014  FINDINGS: Cardiac shadow is mildly enlarged. The lungs are well aerated bilaterally. Minimal scarring is noted in the bases bilaterally but stable. Mild hyperinflation is again seen also stable from the prior exam.  IMPRESSION: COPD without acute abnormality.  Mild bibasilar scarring is noted.    Electronically Signed   By: Inez Catalina M.D.   On: 03/29/2015 18:15      Management plans discussed with the patient and she is in agreement. Stable for discharge to home with home health care  CODE STATUS:     Code Status Orders        Start     Ordered   03/29/15 2116  Full code   Continuous     03/29/15 2115    Advance Directive Documentation        Most Recent Value   Type of Advance Directive  Living will   Pre-existing out of facility DNR order (yellow form or pink MOST form)     "MOST" Form in Place?        TOTAL TIME TAKING CARE OF THIS PATIENT: 35 minutes.    Ardit Danh M.D on 03/30/2015 at 12:46 PM  Between 7am to 6pm - Pager - [phone] After 6pm go to [email or web address] - password [email or web address] [email or web address]  [facility]  Office  [phone]  CC: Primary care physician; No primary care provider on file.

## 2015-03-30 NOTE — Evaluation (Signed)
Physical Therapy Evaluation Patient Details [name] MRN: [id number] DOB: 03/12/1929 Today's Date: 03/30/2015   History of Present Illness  presented to ER with progressive SOB with hypoxia; admitted for acute hospitalization secondary to acute hypoxic respiratory failure secondary to COPD exacerbation.  Clinical Impression  Upon evaluation, patient alert and oriented to all information.  Follows all commands and demonstrates fair/good insight and safety awareness.  Strength and ROM grossly WFL for basic transfers and mobility (chronic weakness of L shoulder).  Requires min/mod assist for bed mobility (does not sleep in bed at home); sit/stand, basic transfers and gait (40') with RW, cga.  Additional distance limited by fatigue. Patient noted at 87-88% on 2L at rest beginning of session; required cuing for pursed lip breathing and increase to 3L (per RN) for recovery to >92%.  Brief desat to 88% with activity, but quickly (within 10-15 seconds) recovers to >92% with seated rest period. Educated in [place], energy conservation and activity pacing; patient/family voiced understanding. Would benefit from skilled PT to address above deficits and promote optimal return to PLOF; Recommend transition to [place] upon discharge from acute hospitalization.; Patient/family aware of recommendations and in agreement with plan.     Follow Up Recommendations Home health PT    Equipment Recommendations  Rolling walker with 5" wheels    Recommendations for Other Services       Precautions / Restrictions Precautions Precautions: Fall Precaution Comments: O2 at 2L Restrictions Weight Bearing Restrictions: No      Mobility  Bed Mobility Overal bed mobility: Needs Assistance Bed Mobility: Supine to Sit     Supine to sit: Mod assist;Min assist     General bed mobility comments: assist for truncal elevation.  States she does not sleep in bed at home; stays in recliner/lift chair to avoid  supine  Transfers Overall transfer level: Needs assistance Equipment used: Rolling walker (2 wheeled) Transfers: Sit to/from Stand Sit to Stand: Min guard;Supervision         General transfer comment: cuing for hand placement  Ambulation/Gait Ambulation/Gait assistance: Min guard Ambulation Distance (Feet): 40 Feet Assistive device: Rolling walker (2 wheeled)       General Gait Details: reciprocal stepping with fair step height/length, forward trunk flexion with increased WBing bilat UEs.  Tends to pick up vs. push walker--educated in pushing to maximize energy conservation.  Stairs            Wheelchair Mobility    Modified Rankin (Stroke Patients Only)       Balance Overall balance assessment: Needs assistance Sitting-balance support: No upper extremity supported Sitting balance-Leahy Scale: Normal     Standing balance support: Single extremity supported;No upper extremity supported Standing balance-Leahy Scale: Fair                               Pertinent Vitals/Pain Pain Assessment: No/denies pain    Home Living Family/patient expects to be discharged to:: Private residence Living Arrangements: Children Available Help at Discharge: Family Type of Home: House Home Access: Stairs to enter Entrance Stairs-Rails: Right Entrance Stairs-Number of Steps: 6 Home Layout: One level Home Equipment: Walker - 2 wheels      Prior Function Level of Independence: Needs assistance   Gait / Transfers Assistance Needed: sup/mod indep with limited household mobility using RW.  Sleeps in recliner/lift chair.  ADL's / Homemaking Assistance Needed: assist from daughter for ADLs and household activities.  Comments: Denies fall  history in previous six months     Hand Dominance        Extremity/Trunk Assessment   Upper Extremity Assessment: Generalized weakness (R UE ROM grossly WFL, L UE limited to shoulder height (after previous cervical surgery);  strength at least 3- to 3+/5)           Lower Extremity Assessment: Generalized weakness (hip ROM generally limited by abdominal adiposition, otherwise ROM grossly WFL; strength at least 4+/5 throughout)      Cervical / Trunk Assessment:  (forward head, rounded shoulders)  Communication   Communication: No difficulties  Cognition Arousal/Alertness: Awake/alert Behavior During Therapy: WFL for tasks assessed/performed Overall Cognitive Status: Within Functional Limits for tasks assessed                      General Comments      Exercises Other Exercises Other Exercises: Educated in benefit of RW vs. SW, activity pacing and energy conservation, oxygen management (awareness of tubing)--patient/family voiced understanding of all information.  Seated UE/LE therex, 1x10, for use as HEP: ankle pumps and LAQs; chair press-ups; horiz UE abduct/adduct (10 minutes)      Assessment/Plan    PT Assessment Patient needs continued PT services  PT Diagnosis Generalized weakness;Difficulty walking   PT Problem List Decreased strength;Decreased activity tolerance;Decreased balance;Decreased mobility;Decreased safety awareness;Cardiopulmonary status limiting activity  PT Treatment Interventions DME instruction;Gait training;Stair training;Functional mobility training;Therapeutic activities;Therapeutic exercise;Patient/family education;Cognitive remediation;Balance training   PT Goals (Current goals can be found in the Care Plan section) Acute Rehab PT Goals Patient Stated Goal: to get out of here and go home PT Goal Formulation: With patient/family Time For Goal Achievement: 04/13/15 Potential to Achieve Goals: Good    Frequency Min 2X/week   Barriers to discharge        Co-evaluation               End of Session Equipment Utilized During Treatment: Gait belt Activity Tolerance: Patient limited by fatigue Patient left: in chair;with call bell/phone within reach;with  chair alarm set;with family/visitor present Nurse Communication: Mobility status (O2 response, discharge recs)         Time: [id number] PT Time Calculation (min) (ACUTE ONLY): 35 min   Charges:   PT Evaluation $Initial PT Evaluation Tier I: 1 Procedure PT Treatments $Therapeutic Activity: 8-22 mins   PT G Codes:       Christana Angelica H. Owens Shark, PT, DPT 03/30/2015 10:30 AM [phone]   Ellison Hughs 03/30/2015, 10:26 AM

## 2015-03-30 NOTE — Progress Notes (Signed)
Patient given discharge teaching and paperwork regarding medications, diet, follow-up appointments and activity. Patient understanding verbalized. No complaints at this time. IV and telemetry discontinued. Skin assessment as previously charted and vitals are stable. Patient being discharged to home with home health. Caregiver/family present during discharge teaching. Home O2 portable tank set up by Home Health agency.  Toniann Ket

## 2015-03-30 NOTE — Care Management Note (Signed)
Case Management Note  Patient Details  [name] MRN: [id number] Date of Birth: 11-May-1929  Subjective/Objective:                 Patient presented to ED from PCP office for exac of COPD.  She has home 02 through [facility].  Physical therapy is recommending a rolling walker.  Attending has ordered home health SN PT and aide.  Patient is in agreement  Action/Plan:   Expected Discharge Date:   (unknown)        03/30/2015       Expected Discharge Plan:    Home health  In-House Referral:     Discharge planning Services     Post Acute Care Choice:    Choice offered to:     DME Arranged:   walker DME Agency:   [facility]  HH Arranged:   SN PT Aide [place] Agency:   [facility]  Status of Service:     Medicare Important Message Given:   yes Date Medicare IM Given:   03/30/2015 Medicare IM give by:    Date Additional Medicare IM Given:    Additional Medicare Important Message give by:     If discussed at [place] of Stay Meetings, dates discussed:    Additional Comments:  Katrina Stack, RN 03/30/2015, 11:48 AM

## 2015-04-01 DIAGNOSIS — M353 Polymyalgia rheumatica: Secondary | ICD-10-CM | POA: Diagnosis not present

## 2015-04-01 DIAGNOSIS — M1990 Unspecified osteoarthritis, unspecified site: Secondary | ICD-10-CM | POA: Diagnosis not present

## 2015-04-01 DIAGNOSIS — G2581 Restless legs syndrome: Secondary | ICD-10-CM | POA: Diagnosis not present

## 2015-04-01 DIAGNOSIS — E119 Type 2 diabetes mellitus without complications: Secondary | ICD-10-CM | POA: Diagnosis not present

## 2015-04-01 DIAGNOSIS — J441 Chronic obstructive pulmonary disease with (acute) exacerbation: Secondary | ICD-10-CM | POA: Diagnosis not present

## 2015-04-01 DIAGNOSIS — G629 Polyneuropathy, unspecified: Secondary | ICD-10-CM | POA: Diagnosis not present

## 2015-04-01 DIAGNOSIS — F329 Major depressive disorder, single episode, unspecified: Secondary | ICD-10-CM | POA: Diagnosis not present

## 2015-04-01 DIAGNOSIS — N183 Chronic kidney disease, stage 3 (moderate): Secondary | ICD-10-CM | POA: Diagnosis not present

## 2015-04-01 DIAGNOSIS — I129 Hypertensive chronic kidney disease with stage 1 through stage 4 chronic kidney disease, or unspecified chronic kidney disease: Secondary | ICD-10-CM | POA: Diagnosis not present

## 2015-04-05 DIAGNOSIS — J441 Chronic obstructive pulmonary disease with (acute) exacerbation: Secondary | ICD-10-CM | POA: Diagnosis not present

## 2015-04-05 DIAGNOSIS — E1122 Type 2 diabetes mellitus with diabetic chronic kidney disease: Secondary | ICD-10-CM | POA: Diagnosis not present

## 2015-04-05 DIAGNOSIS — E119 Type 2 diabetes mellitus without complications: Secondary | ICD-10-CM | POA: Diagnosis not present

## 2015-04-05 DIAGNOSIS — G629 Polyneuropathy, unspecified: Secondary | ICD-10-CM | POA: Diagnosis not present

## 2015-04-05 DIAGNOSIS — J410 Simple chronic bronchitis: Secondary | ICD-10-CM | POA: Diagnosis not present

## 2015-04-05 DIAGNOSIS — G2581 Restless legs syndrome: Secondary | ICD-10-CM | POA: Diagnosis not present

## 2015-04-05 DIAGNOSIS — M1990 Unspecified osteoarthritis, unspecified site: Secondary | ICD-10-CM | POA: Diagnosis not present

## 2015-04-05 DIAGNOSIS — N183 Chronic kidney disease, stage 3 (moderate): Secondary | ICD-10-CM | POA: Diagnosis not present

## 2015-04-05 DIAGNOSIS — I129 Hypertensive chronic kidney disease with stage 1 through stage 4 chronic kidney disease, or unspecified chronic kidney disease: Secondary | ICD-10-CM | POA: Diagnosis not present

## 2015-04-05 DIAGNOSIS — R700 Elevated erythrocyte sedimentation rate: Secondary | ICD-10-CM | POA: Diagnosis not present

## 2015-04-05 DIAGNOSIS — F329 Major depressive disorder, single episode, unspecified: Secondary | ICD-10-CM | POA: Diagnosis not present

## 2015-04-05 DIAGNOSIS — M353 Polymyalgia rheumatica: Secondary | ICD-10-CM | POA: Diagnosis not present

## 2015-04-07 DIAGNOSIS — J441 Chronic obstructive pulmonary disease with (acute) exacerbation: Secondary | ICD-10-CM | POA: Diagnosis not present

## 2015-04-07 DIAGNOSIS — G2581 Restless legs syndrome: Secondary | ICD-10-CM | POA: Diagnosis not present

## 2015-04-07 DIAGNOSIS — G629 Polyneuropathy, unspecified: Secondary | ICD-10-CM | POA: Diagnosis not present

## 2015-04-07 DIAGNOSIS — I129 Hypertensive chronic kidney disease with stage 1 through stage 4 chronic kidney disease, or unspecified chronic kidney disease: Secondary | ICD-10-CM | POA: Diagnosis not present

## 2015-04-07 DIAGNOSIS — N183 Chronic kidney disease, stage 3 (moderate): Secondary | ICD-10-CM | POA: Diagnosis not present

## 2015-04-07 DIAGNOSIS — F329 Major depressive disorder, single episode, unspecified: Secondary | ICD-10-CM | POA: Diagnosis not present

## 2015-04-07 DIAGNOSIS — E119 Type 2 diabetes mellitus without complications: Secondary | ICD-10-CM | POA: Diagnosis not present

## 2015-04-07 DIAGNOSIS — M353 Polymyalgia rheumatica: Secondary | ICD-10-CM | POA: Diagnosis not present

## 2015-04-07 DIAGNOSIS — M1990 Unspecified osteoarthritis, unspecified site: Secondary | ICD-10-CM | POA: Diagnosis not present

## 2015-04-11 DIAGNOSIS — E1122 Type 2 diabetes mellitus with diabetic chronic kidney disease: Secondary | ICD-10-CM | POA: Diagnosis not present

## 2015-04-11 DIAGNOSIS — J441 Chronic obstructive pulmonary disease with (acute) exacerbation: Secondary | ICD-10-CM | POA: Diagnosis not present

## 2015-04-11 DIAGNOSIS — I129 Hypertensive chronic kidney disease with stage 1 through stage 4 chronic kidney disease, or unspecified chronic kidney disease: Secondary | ICD-10-CM | POA: Diagnosis not present

## 2015-04-11 DIAGNOSIS — L03113 Cellulitis of right upper limb: Secondary | ICD-10-CM | POA: Diagnosis not present

## 2015-04-11 DIAGNOSIS — M1990 Unspecified osteoarthritis, unspecified site: Secondary | ICD-10-CM | POA: Diagnosis not present

## 2015-04-11 DIAGNOSIS — E119 Type 2 diabetes mellitus without complications: Secondary | ICD-10-CM | POA: Diagnosis not present

## 2015-04-11 DIAGNOSIS — N183 Chronic kidney disease, stage 3 (moderate): Secondary | ICD-10-CM | POA: Diagnosis not present

## 2015-04-11 DIAGNOSIS — F329 Major depressive disorder, single episode, unspecified: Secondary | ICD-10-CM | POA: Diagnosis not present

## 2015-04-11 DIAGNOSIS — G629 Polyneuropathy, unspecified: Secondary | ICD-10-CM | POA: Diagnosis not present

## 2015-04-11 DIAGNOSIS — M353 Polymyalgia rheumatica: Secondary | ICD-10-CM | POA: Diagnosis not present

## 2015-04-11 DIAGNOSIS — G2581 Restless legs syndrome: Secondary | ICD-10-CM | POA: Diagnosis not present

## 2015-04-12 DIAGNOSIS — N183 Chronic kidney disease, stage 3 (moderate): Secondary | ICD-10-CM | POA: Diagnosis not present

## 2015-04-12 DIAGNOSIS — J441 Chronic obstructive pulmonary disease with (acute) exacerbation: Secondary | ICD-10-CM | POA: Diagnosis not present

## 2015-04-12 DIAGNOSIS — E119 Type 2 diabetes mellitus without complications: Secondary | ICD-10-CM | POA: Diagnosis not present

## 2015-04-13 DIAGNOSIS — M353 Polymyalgia rheumatica: Secondary | ICD-10-CM | POA: Diagnosis not present

## 2015-04-13 DIAGNOSIS — J441 Chronic obstructive pulmonary disease with (acute) exacerbation: Secondary | ICD-10-CM | POA: Diagnosis not present

## 2015-04-13 DIAGNOSIS — F329 Major depressive disorder, single episode, unspecified: Secondary | ICD-10-CM | POA: Diagnosis not present

## 2015-04-13 DIAGNOSIS — E119 Type 2 diabetes mellitus without complications: Secondary | ICD-10-CM | POA: Diagnosis not present

## 2015-04-13 DIAGNOSIS — N183 Chronic kidney disease, stage 3 (moderate): Secondary | ICD-10-CM | POA: Diagnosis not present

## 2015-04-13 DIAGNOSIS — G629 Polyneuropathy, unspecified: Secondary | ICD-10-CM | POA: Diagnosis not present

## 2015-04-13 DIAGNOSIS — G2581 Restless legs syndrome: Secondary | ICD-10-CM | POA: Diagnosis not present

## 2015-04-13 DIAGNOSIS — M1990 Unspecified osteoarthritis, unspecified site: Secondary | ICD-10-CM | POA: Diagnosis not present

## 2015-04-13 DIAGNOSIS — I129 Hypertensive chronic kidney disease with stage 1 through stage 4 chronic kidney disease, or unspecified chronic kidney disease: Secondary | ICD-10-CM | POA: Diagnosis not present

## 2015-04-14 DIAGNOSIS — M1990 Unspecified osteoarthritis, unspecified site: Secondary | ICD-10-CM | POA: Diagnosis not present

## 2015-04-14 DIAGNOSIS — J441 Chronic obstructive pulmonary disease with (acute) exacerbation: Secondary | ICD-10-CM | POA: Diagnosis not present

## 2015-04-14 DIAGNOSIS — M353 Polymyalgia rheumatica: Secondary | ICD-10-CM | POA: Diagnosis not present

## 2015-04-14 DIAGNOSIS — F329 Major depressive disorder, single episode, unspecified: Secondary | ICD-10-CM | POA: Diagnosis not present

## 2015-04-14 DIAGNOSIS — I519 Heart disease, unspecified: Secondary | ICD-10-CM | POA: Diagnosis not present

## 2015-04-14 DIAGNOSIS — I129 Hypertensive chronic kidney disease with stage 1 through stage 4 chronic kidney disease, or unspecified chronic kidney disease: Secondary | ICD-10-CM | POA: Diagnosis not present

## 2015-04-14 DIAGNOSIS — J439 Emphysema, unspecified: Secondary | ICD-10-CM | POA: Diagnosis not present

## 2015-04-14 DIAGNOSIS — G2581 Restless legs syndrome: Secondary | ICD-10-CM | POA: Diagnosis not present

## 2015-04-14 DIAGNOSIS — G629 Polyneuropathy, unspecified: Secondary | ICD-10-CM | POA: Diagnosis not present

## 2015-04-14 DIAGNOSIS — E6609 Other obesity due to excess calories: Secondary | ICD-10-CM | POA: Diagnosis not present

## 2015-04-14 DIAGNOSIS — E119 Type 2 diabetes mellitus without complications: Secondary | ICD-10-CM | POA: Diagnosis not present

## 2015-04-14 DIAGNOSIS — N183 Chronic kidney disease, stage 3 (moderate): Secondary | ICD-10-CM | POA: Diagnosis not present

## 2015-04-18 DIAGNOSIS — I129 Hypertensive chronic kidney disease with stage 1 through stage 4 chronic kidney disease, or unspecified chronic kidney disease: Secondary | ICD-10-CM | POA: Diagnosis not present

## 2015-04-18 DIAGNOSIS — M1990 Unspecified osteoarthritis, unspecified site: Secondary | ICD-10-CM | POA: Diagnosis not present

## 2015-04-18 DIAGNOSIS — M353 Polymyalgia rheumatica: Secondary | ICD-10-CM | POA: Diagnosis not present

## 2015-04-18 DIAGNOSIS — E119 Type 2 diabetes mellitus without complications: Secondary | ICD-10-CM | POA: Diagnosis not present

## 2015-04-18 DIAGNOSIS — G2581 Restless legs syndrome: Secondary | ICD-10-CM | POA: Diagnosis not present

## 2015-04-18 DIAGNOSIS — J441 Chronic obstructive pulmonary disease with (acute) exacerbation: Secondary | ICD-10-CM | POA: Diagnosis not present

## 2015-04-18 DIAGNOSIS — N183 Chronic kidney disease, stage 3 (moderate): Secondary | ICD-10-CM | POA: Diagnosis not present

## 2015-04-18 DIAGNOSIS — G629 Polyneuropathy, unspecified: Secondary | ICD-10-CM | POA: Diagnosis not present

## 2015-04-18 DIAGNOSIS — F329 Major depressive disorder, single episode, unspecified: Secondary | ICD-10-CM | POA: Diagnosis not present

## 2015-04-19 DIAGNOSIS — J441 Chronic obstructive pulmonary disease with (acute) exacerbation: Secondary | ICD-10-CM | POA: Diagnosis not present

## 2015-04-19 DIAGNOSIS — G2581 Restless legs syndrome: Secondary | ICD-10-CM | POA: Diagnosis not present

## 2015-04-19 DIAGNOSIS — M1990 Unspecified osteoarthritis, unspecified site: Secondary | ICD-10-CM | POA: Diagnosis not present

## 2015-04-19 DIAGNOSIS — F329 Major depressive disorder, single episode, unspecified: Secondary | ICD-10-CM | POA: Diagnosis not present

## 2015-04-19 DIAGNOSIS — I129 Hypertensive chronic kidney disease with stage 1 through stage 4 chronic kidney disease, or unspecified chronic kidney disease: Secondary | ICD-10-CM | POA: Diagnosis not present

## 2015-04-19 DIAGNOSIS — G629 Polyneuropathy, unspecified: Secondary | ICD-10-CM | POA: Diagnosis not present

## 2015-04-19 DIAGNOSIS — E119 Type 2 diabetes mellitus without complications: Secondary | ICD-10-CM | POA: Diagnosis not present

## 2015-04-19 DIAGNOSIS — N183 Chronic kidney disease, stage 3 (moderate): Secondary | ICD-10-CM | POA: Diagnosis not present

## 2015-04-19 DIAGNOSIS — M353 Polymyalgia rheumatica: Secondary | ICD-10-CM | POA: Diagnosis not present

## 2015-04-21 DIAGNOSIS — G2581 Restless legs syndrome: Secondary | ICD-10-CM | POA: Diagnosis not present

## 2015-04-21 DIAGNOSIS — I129 Hypertensive chronic kidney disease with stage 1 through stage 4 chronic kidney disease, or unspecified chronic kidney disease: Secondary | ICD-10-CM | POA: Diagnosis not present

## 2015-04-21 DIAGNOSIS — F329 Major depressive disorder, single episode, unspecified: Secondary | ICD-10-CM | POA: Diagnosis not present

## 2015-04-21 DIAGNOSIS — E119 Type 2 diabetes mellitus without complications: Secondary | ICD-10-CM | POA: Diagnosis not present

## 2015-04-21 DIAGNOSIS — N183 Chronic kidney disease, stage 3 (moderate): Secondary | ICD-10-CM | POA: Diagnosis not present

## 2015-04-21 DIAGNOSIS — M353 Polymyalgia rheumatica: Secondary | ICD-10-CM | POA: Diagnosis not present

## 2015-04-21 DIAGNOSIS — M1990 Unspecified osteoarthritis, unspecified site: Secondary | ICD-10-CM | POA: Diagnosis not present

## 2015-04-21 DIAGNOSIS — G629 Polyneuropathy, unspecified: Secondary | ICD-10-CM | POA: Diagnosis not present

## 2015-04-21 DIAGNOSIS — J441 Chronic obstructive pulmonary disease with (acute) exacerbation: Secondary | ICD-10-CM | POA: Diagnosis not present

## 2015-04-26 DIAGNOSIS — M353 Polymyalgia rheumatica: Secondary | ICD-10-CM | POA: Diagnosis not present

## 2015-04-26 DIAGNOSIS — N183 Chronic kidney disease, stage 3 (moderate): Secondary | ICD-10-CM | POA: Diagnosis not present

## 2015-04-26 DIAGNOSIS — G629 Polyneuropathy, unspecified: Secondary | ICD-10-CM | POA: Diagnosis not present

## 2015-04-26 DIAGNOSIS — F329 Major depressive disorder, single episode, unspecified: Secondary | ICD-10-CM | POA: Diagnosis not present

## 2015-04-26 DIAGNOSIS — E119 Type 2 diabetes mellitus without complications: Secondary | ICD-10-CM | POA: Diagnosis not present

## 2015-04-26 DIAGNOSIS — M1990 Unspecified osteoarthritis, unspecified site: Secondary | ICD-10-CM | POA: Diagnosis not present

## 2015-04-26 DIAGNOSIS — J441 Chronic obstructive pulmonary disease with (acute) exacerbation: Secondary | ICD-10-CM | POA: Diagnosis not present

## 2015-04-26 DIAGNOSIS — G2581 Restless legs syndrome: Secondary | ICD-10-CM | POA: Diagnosis not present

## 2015-04-26 DIAGNOSIS — I129 Hypertensive chronic kidney disease with stage 1 through stage 4 chronic kidney disease, or unspecified chronic kidney disease: Secondary | ICD-10-CM | POA: Diagnosis not present

## 2015-04-27 DIAGNOSIS — E119 Type 2 diabetes mellitus without complications: Secondary | ICD-10-CM | POA: Diagnosis not present

## 2015-04-27 DIAGNOSIS — G2581 Restless legs syndrome: Secondary | ICD-10-CM | POA: Diagnosis not present

## 2015-04-27 DIAGNOSIS — N183 Chronic kidney disease, stage 3 (moderate): Secondary | ICD-10-CM | POA: Diagnosis not present

## 2015-04-27 DIAGNOSIS — J441 Chronic obstructive pulmonary disease with (acute) exacerbation: Secondary | ICD-10-CM | POA: Diagnosis not present

## 2015-04-27 DIAGNOSIS — M1990 Unspecified osteoarthritis, unspecified site: Secondary | ICD-10-CM | POA: Diagnosis not present

## 2015-04-27 DIAGNOSIS — M353 Polymyalgia rheumatica: Secondary | ICD-10-CM | POA: Diagnosis not present

## 2015-04-27 DIAGNOSIS — G629 Polyneuropathy, unspecified: Secondary | ICD-10-CM | POA: Diagnosis not present

## 2015-04-27 DIAGNOSIS — I129 Hypertensive chronic kidney disease with stage 1 through stage 4 chronic kidney disease, or unspecified chronic kidney disease: Secondary | ICD-10-CM | POA: Diagnosis not present

## 2015-04-27 DIAGNOSIS — F329 Major depressive disorder, single episode, unspecified: Secondary | ICD-10-CM | POA: Diagnosis not present

## 2015-04-28 DIAGNOSIS — H3532 Exudative age-related macular degeneration: Secondary | ICD-10-CM | POA: Diagnosis not present

## 2015-05-01 DIAGNOSIS — E119 Type 2 diabetes mellitus without complications: Secondary | ICD-10-CM | POA: Diagnosis not present

## 2015-05-01 DIAGNOSIS — M353 Polymyalgia rheumatica: Secondary | ICD-10-CM | POA: Diagnosis not present

## 2015-05-01 DIAGNOSIS — G629 Polyneuropathy, unspecified: Secondary | ICD-10-CM | POA: Diagnosis not present

## 2015-05-01 DIAGNOSIS — F329 Major depressive disorder, single episode, unspecified: Secondary | ICD-10-CM | POA: Diagnosis not present

## 2015-05-01 DIAGNOSIS — M1990 Unspecified osteoarthritis, unspecified site: Secondary | ICD-10-CM | POA: Diagnosis not present

## 2015-05-01 DIAGNOSIS — G2581 Restless legs syndrome: Secondary | ICD-10-CM | POA: Diagnosis not present

## 2015-05-01 DIAGNOSIS — I129 Hypertensive chronic kidney disease with stage 1 through stage 4 chronic kidney disease, or unspecified chronic kidney disease: Secondary | ICD-10-CM | POA: Diagnosis not present

## 2015-05-01 DIAGNOSIS — N183 Chronic kidney disease, stage 3 (moderate): Secondary | ICD-10-CM | POA: Diagnosis not present

## 2015-05-01 DIAGNOSIS — J441 Chronic obstructive pulmonary disease with (acute) exacerbation: Secondary | ICD-10-CM | POA: Diagnosis not present

## 2015-05-03 DIAGNOSIS — G2581 Restless legs syndrome: Secondary | ICD-10-CM | POA: Diagnosis not present

## 2015-05-03 DIAGNOSIS — I129 Hypertensive chronic kidney disease with stage 1 through stage 4 chronic kidney disease, or unspecified chronic kidney disease: Secondary | ICD-10-CM | POA: Diagnosis not present

## 2015-05-03 DIAGNOSIS — M1990 Unspecified osteoarthritis, unspecified site: Secondary | ICD-10-CM | POA: Diagnosis not present

## 2015-05-03 DIAGNOSIS — N183 Chronic kidney disease, stage 3 (moderate): Secondary | ICD-10-CM | POA: Diagnosis not present

## 2015-05-03 DIAGNOSIS — M353 Polymyalgia rheumatica: Secondary | ICD-10-CM | POA: Diagnosis not present

## 2015-05-03 DIAGNOSIS — F329 Major depressive disorder, single episode, unspecified: Secondary | ICD-10-CM | POA: Diagnosis not present

## 2015-05-03 DIAGNOSIS — G629 Polyneuropathy, unspecified: Secondary | ICD-10-CM | POA: Diagnosis not present

## 2015-05-03 DIAGNOSIS — E119 Type 2 diabetes mellitus without complications: Secondary | ICD-10-CM | POA: Diagnosis not present

## 2015-05-03 DIAGNOSIS — J441 Chronic obstructive pulmonary disease with (acute) exacerbation: Secondary | ICD-10-CM | POA: Diagnosis not present

## 2015-05-04 DIAGNOSIS — M1990 Unspecified osteoarthritis, unspecified site: Secondary | ICD-10-CM | POA: Diagnosis not present

## 2015-05-04 DIAGNOSIS — G629 Polyneuropathy, unspecified: Secondary | ICD-10-CM | POA: Diagnosis not present

## 2015-05-04 DIAGNOSIS — M353 Polymyalgia rheumatica: Secondary | ICD-10-CM | POA: Diagnosis not present

## 2015-05-04 DIAGNOSIS — J441 Chronic obstructive pulmonary disease with (acute) exacerbation: Secondary | ICD-10-CM | POA: Diagnosis not present

## 2015-05-04 DIAGNOSIS — I129 Hypertensive chronic kidney disease with stage 1 through stage 4 chronic kidney disease, or unspecified chronic kidney disease: Secondary | ICD-10-CM | POA: Diagnosis not present

## 2015-05-04 DIAGNOSIS — N183 Chronic kidney disease, stage 3 (moderate): Secondary | ICD-10-CM | POA: Diagnosis not present

## 2015-05-04 DIAGNOSIS — E119 Type 2 diabetes mellitus without complications: Secondary | ICD-10-CM | POA: Diagnosis not present

## 2015-05-04 DIAGNOSIS — F329 Major depressive disorder, single episode, unspecified: Secondary | ICD-10-CM | POA: Diagnosis not present

## 2015-05-04 DIAGNOSIS — G2581 Restless legs syndrome: Secondary | ICD-10-CM | POA: Diagnosis not present

## 2015-05-08 DIAGNOSIS — G4734 Idiopathic sleep related nonobstructive alveolar hypoventilation: Secondary | ICD-10-CM | POA: Diagnosis not present

## 2015-05-08 DIAGNOSIS — G2581 Restless legs syndrome: Secondary | ICD-10-CM | POA: Diagnosis not present

## 2015-05-08 DIAGNOSIS — L03116 Cellulitis of left lower limb: Secondary | ICD-10-CM | POA: Diagnosis not present

## 2015-05-08 DIAGNOSIS — R700 Elevated erythrocyte sedimentation rate: Secondary | ICD-10-CM | POA: Diagnosis not present

## 2015-05-08 DIAGNOSIS — J439 Emphysema, unspecified: Secondary | ICD-10-CM | POA: Diagnosis not present

## 2015-05-10 DIAGNOSIS — F329 Major depressive disorder, single episode, unspecified: Secondary | ICD-10-CM | POA: Diagnosis not present

## 2015-05-10 DIAGNOSIS — G2581 Restless legs syndrome: Secondary | ICD-10-CM | POA: Diagnosis not present

## 2015-05-10 DIAGNOSIS — M1990 Unspecified osteoarthritis, unspecified site: Secondary | ICD-10-CM | POA: Diagnosis not present

## 2015-05-10 DIAGNOSIS — J441 Chronic obstructive pulmonary disease with (acute) exacerbation: Secondary | ICD-10-CM | POA: Diagnosis not present

## 2015-05-10 DIAGNOSIS — E119 Type 2 diabetes mellitus without complications: Secondary | ICD-10-CM | POA: Diagnosis not present

## 2015-05-10 DIAGNOSIS — I129 Hypertensive chronic kidney disease with stage 1 through stage 4 chronic kidney disease, or unspecified chronic kidney disease: Secondary | ICD-10-CM | POA: Diagnosis not present

## 2015-05-10 DIAGNOSIS — G629 Polyneuropathy, unspecified: Secondary | ICD-10-CM | POA: Diagnosis not present

## 2015-05-10 DIAGNOSIS — N183 Chronic kidney disease, stage 3 (moderate): Secondary | ICD-10-CM | POA: Diagnosis not present

## 2015-05-10 DIAGNOSIS — M353 Polymyalgia rheumatica: Secondary | ICD-10-CM | POA: Diagnosis not present

## 2015-05-11 DIAGNOSIS — F329 Major depressive disorder, single episode, unspecified: Secondary | ICD-10-CM | POA: Diagnosis not present

## 2015-05-11 DIAGNOSIS — M353 Polymyalgia rheumatica: Secondary | ICD-10-CM | POA: Diagnosis not present

## 2015-05-11 DIAGNOSIS — I129 Hypertensive chronic kidney disease with stage 1 through stage 4 chronic kidney disease, or unspecified chronic kidney disease: Secondary | ICD-10-CM | POA: Diagnosis not present

## 2015-05-11 DIAGNOSIS — J441 Chronic obstructive pulmonary disease with (acute) exacerbation: Secondary | ICD-10-CM | POA: Diagnosis not present

## 2015-05-11 DIAGNOSIS — E119 Type 2 diabetes mellitus without complications: Secondary | ICD-10-CM | POA: Diagnosis not present

## 2015-05-11 DIAGNOSIS — G629 Polyneuropathy, unspecified: Secondary | ICD-10-CM | POA: Diagnosis not present

## 2015-05-11 DIAGNOSIS — N183 Chronic kidney disease, stage 3 (moderate): Secondary | ICD-10-CM | POA: Diagnosis not present

## 2015-05-11 DIAGNOSIS — G2581 Restless legs syndrome: Secondary | ICD-10-CM | POA: Diagnosis not present

## 2015-05-11 DIAGNOSIS — M1990 Unspecified osteoarthritis, unspecified site: Secondary | ICD-10-CM | POA: Diagnosis not present

## 2015-05-26 DIAGNOSIS — F329 Major depressive disorder, single episode, unspecified: Secondary | ICD-10-CM | POA: Diagnosis not present

## 2015-05-26 DIAGNOSIS — G2581 Restless legs syndrome: Secondary | ICD-10-CM | POA: Diagnosis not present

## 2015-05-26 DIAGNOSIS — G629 Polyneuropathy, unspecified: Secondary | ICD-10-CM | POA: Diagnosis not present

## 2015-05-26 DIAGNOSIS — J441 Chronic obstructive pulmonary disease with (acute) exacerbation: Secondary | ICD-10-CM | POA: Diagnosis not present

## 2015-05-26 DIAGNOSIS — N183 Chronic kidney disease, stage 3 (moderate): Secondary | ICD-10-CM | POA: Diagnosis not present

## 2015-05-26 DIAGNOSIS — E119 Type 2 diabetes mellitus without complications: Secondary | ICD-10-CM | POA: Diagnosis not present

## 2015-05-26 DIAGNOSIS — M353 Polymyalgia rheumatica: Secondary | ICD-10-CM | POA: Diagnosis not present

## 2015-05-26 DIAGNOSIS — M1990 Unspecified osteoarthritis, unspecified site: Secondary | ICD-10-CM | POA: Diagnosis not present

## 2015-05-26 DIAGNOSIS — I129 Hypertensive chronic kidney disease with stage 1 through stage 4 chronic kidney disease, or unspecified chronic kidney disease: Secondary | ICD-10-CM | POA: Diagnosis not present

## 2015-06-07 DIAGNOSIS — H3532 Exudative age-related macular degeneration: Secondary | ICD-10-CM | POA: Diagnosis not present

## 2015-06-14 DIAGNOSIS — M6080 Other myositis, unspecified site: Secondary | ICD-10-CM | POA: Diagnosis not present

## 2015-06-14 DIAGNOSIS — R0902 Hypoxemia: Secondary | ICD-10-CM | POA: Diagnosis not present

## 2015-06-14 DIAGNOSIS — I10 Essential (primary) hypertension: Secondary | ICD-10-CM | POA: Diagnosis not present

## 2015-07-03 ENCOUNTER — Other Ambulatory Visit: Payer: Self-pay | Admitting: Internal Medicine

## 2015-07-03 DIAGNOSIS — E1122 Type 2 diabetes mellitus with diabetic chronic kidney disease: Secondary | ICD-10-CM | POA: Diagnosis not present

## 2015-07-03 DIAGNOSIS — N183 Chronic kidney disease, stage 3 (moderate): Secondary | ICD-10-CM | POA: Diagnosis not present

## 2015-07-03 DIAGNOSIS — E782 Mixed hyperlipidemia: Secondary | ICD-10-CM | POA: Diagnosis not present

## 2015-07-03 DIAGNOSIS — Z79899 Other long term (current) drug therapy: Secondary | ICD-10-CM | POA: Diagnosis not present

## 2015-07-03 DIAGNOSIS — E559 Vitamin D deficiency, unspecified: Secondary | ICD-10-CM | POA: Diagnosis not present

## 2015-07-03 DIAGNOSIS — R700 Elevated erythrocyte sedimentation rate: Secondary | ICD-10-CM | POA: Diagnosis not present

## 2015-07-03 DIAGNOSIS — Z1231 Encounter for screening mammogram for malignant neoplasm of breast: Secondary | ICD-10-CM

## 2015-07-07 DIAGNOSIS — E559 Vitamin D deficiency, unspecified: Secondary | ICD-10-CM | POA: Diagnosis not present

## 2015-07-07 DIAGNOSIS — E1122 Type 2 diabetes mellitus with diabetic chronic kidney disease: Secondary | ICD-10-CM | POA: Diagnosis not present

## 2015-07-07 DIAGNOSIS — N183 Chronic kidney disease, stage 3 (moderate): Secondary | ICD-10-CM | POA: Diagnosis not present

## 2015-07-07 DIAGNOSIS — Z79899 Other long term (current) drug therapy: Secondary | ICD-10-CM | POA: Diagnosis not present

## 2015-07-07 DIAGNOSIS — E782 Mixed hyperlipidemia: Secondary | ICD-10-CM | POA: Diagnosis not present

## 2015-07-12 DIAGNOSIS — H3532 Exudative age-related macular degeneration: Secondary | ICD-10-CM | POA: Diagnosis not present

## 2015-07-15 DIAGNOSIS — R0902 Hypoxemia: Secondary | ICD-10-CM | POA: Diagnosis not present

## 2015-07-15 DIAGNOSIS — M6080 Other myositis, unspecified site: Secondary | ICD-10-CM | POA: Diagnosis not present

## 2015-07-15 DIAGNOSIS — I10 Essential (primary) hypertension: Secondary | ICD-10-CM | POA: Diagnosis not present

## 2015-07-24 DIAGNOSIS — I8312 Varicose veins of left lower extremity with inflammation: Secondary | ICD-10-CM | POA: Diagnosis not present

## 2015-07-24 DIAGNOSIS — I8311 Varicose veins of right lower extremity with inflammation: Secondary | ICD-10-CM | POA: Diagnosis not present

## 2015-07-24 DIAGNOSIS — I83029 Varicose veins of left lower extremity with ulcer of unspecified site: Secondary | ICD-10-CM | POA: Diagnosis not present

## 2015-07-24 DIAGNOSIS — N183 Chronic kidney disease, stage 3 (moderate): Secondary | ICD-10-CM | POA: Diagnosis not present

## 2015-07-28 DIAGNOSIS — I878 Other specified disorders of veins: Secondary | ICD-10-CM | POA: Diagnosis not present

## 2015-08-10 DIAGNOSIS — I890 Lymphedema, not elsewhere classified: Secondary | ICD-10-CM | POA: Diagnosis not present

## 2015-08-10 DIAGNOSIS — I872 Venous insufficiency (chronic) (peripheral): Secondary | ICD-10-CM | POA: Diagnosis not present

## 2015-08-10 DIAGNOSIS — J449 Chronic obstructive pulmonary disease, unspecified: Secondary | ICD-10-CM | POA: Diagnosis not present

## 2015-08-10 DIAGNOSIS — M79609 Pain in unspecified limb: Secondary | ICD-10-CM | POA: Diagnosis not present

## 2015-08-10 DIAGNOSIS — R269 Unspecified abnormalities of gait and mobility: Secondary | ICD-10-CM | POA: Diagnosis not present

## 2015-08-10 DIAGNOSIS — M1990 Unspecified osteoarthritis, unspecified site: Secondary | ICD-10-CM | POA: Diagnosis not present

## 2015-08-10 DIAGNOSIS — M7989 Other specified soft tissue disorders: Secondary | ICD-10-CM | POA: Diagnosis not present

## 2015-08-15 DIAGNOSIS — R0902 Hypoxemia: Secondary | ICD-10-CM | POA: Diagnosis not present

## 2015-08-15 DIAGNOSIS — I10 Essential (primary) hypertension: Secondary | ICD-10-CM | POA: Diagnosis not present

## 2015-08-15 DIAGNOSIS — M6080 Other myositis, unspecified site: Secondary | ICD-10-CM | POA: Diagnosis not present

## 2015-09-04 DIAGNOSIS — H353211 Exudative age-related macular degeneration, right eye, with active choroidal neovascularization: Secondary | ICD-10-CM | POA: Diagnosis not present

## 2015-09-14 DIAGNOSIS — R0902 Hypoxemia: Secondary | ICD-10-CM | POA: Diagnosis not present

## 2015-09-14 DIAGNOSIS — M6080 Other myositis, unspecified site: Secondary | ICD-10-CM | POA: Diagnosis not present

## 2015-09-14 DIAGNOSIS — I10 Essential (primary) hypertension: Secondary | ICD-10-CM | POA: Diagnosis not present

## 2015-09-18 DIAGNOSIS — R700 Elevated erythrocyte sedimentation rate: Secondary | ICD-10-CM | POA: Diagnosis not present

## 2015-09-18 DIAGNOSIS — M1990 Unspecified osteoarthritis, unspecified site: Secondary | ICD-10-CM | POA: Diagnosis not present

## 2015-09-22 DIAGNOSIS — Z8679 Personal history of other diseases of the circulatory system: Secondary | ICD-10-CM | POA: Diagnosis not present

## 2015-09-22 DIAGNOSIS — J439 Emphysema, unspecified: Secondary | ICD-10-CM | POA: Diagnosis not present

## 2015-09-22 DIAGNOSIS — G4734 Idiopathic sleep related nonobstructive alveolar hypoventilation: Secondary | ICD-10-CM | POA: Diagnosis not present

## 2015-10-15 DIAGNOSIS — M6080 Other myositis, unspecified site: Secondary | ICD-10-CM | POA: Diagnosis not present

## 2015-10-15 DIAGNOSIS — R0902 Hypoxemia: Secondary | ICD-10-CM | POA: Diagnosis not present

## 2015-10-15 DIAGNOSIS — I10 Essential (primary) hypertension: Secondary | ICD-10-CM | POA: Diagnosis not present

## 2015-10-27 ENCOUNTER — Ambulatory Visit
Admission: RE | Admit: 2015-10-27 | Discharge: 2015-10-27 | Disposition: A | Discharge status: Home / Self Care | Payer: Commercial Managed Care - HMO | Source: Ambulatory Visit | Attending: Internal Medicine | Admitting: Internal Medicine

## 2015-10-27 ENCOUNTER — Other Ambulatory Visit: Payer: Self-pay | Admitting: Internal Medicine

## 2015-10-27 DIAGNOSIS — N183 Chronic kidney disease, stage 3 (moderate): Secondary | ICD-10-CM | POA: Diagnosis not present

## 2015-10-27 DIAGNOSIS — Z79899 Other long term (current) drug therapy: Secondary | ICD-10-CM | POA: Diagnosis not present

## 2015-10-27 DIAGNOSIS — J439 Emphysema, unspecified: Secondary | ICD-10-CM | POA: Diagnosis not present

## 2015-10-27 DIAGNOSIS — R600 Localized edema: Secondary | ICD-10-CM | POA: Diagnosis not present

## 2015-10-27 DIAGNOSIS — Z794 Long term (current) use of insulin: Secondary | ICD-10-CM | POA: Diagnosis not present

## 2015-10-27 DIAGNOSIS — E1122 Type 2 diabetes mellitus with diabetic chronic kidney disease: Secondary | ICD-10-CM | POA: Diagnosis not present

## 2015-10-27 DIAGNOSIS — I10 Essential (primary) hypertension: Secondary | ICD-10-CM | POA: Diagnosis not present

## 2015-10-27 DIAGNOSIS — S8992XA Unspecified injury of left lower leg, initial encounter: Secondary | ICD-10-CM | POA: Diagnosis not present

## 2015-10-27 DIAGNOSIS — L539 Erythematous condition, unspecified: Secondary | ICD-10-CM | POA: Diagnosis not present

## 2015-11-08 DIAGNOSIS — H35721 Serous detachment of retinal pigment epithelium, right eye: Secondary | ICD-10-CM | POA: Diagnosis not present

## 2015-11-08 DIAGNOSIS — H353211 Exudative age-related macular degeneration, right eye, with active choroidal neovascularization: Secondary | ICD-10-CM | POA: Diagnosis not present

## 2015-11-08 DIAGNOSIS — H353134 Nonexudative age-related macular degeneration, bilateral, advanced atrophic with subfoveal involvement: Secondary | ICD-10-CM | POA: Diagnosis not present

## 2015-11-14 DIAGNOSIS — I10 Essential (primary) hypertension: Secondary | ICD-10-CM | POA: Diagnosis not present

## 2015-11-14 DIAGNOSIS — M6080 Other myositis, unspecified site: Secondary | ICD-10-CM | POA: Diagnosis not present

## 2015-11-14 DIAGNOSIS — R0902 Hypoxemia: Secondary | ICD-10-CM | POA: Diagnosis not present

## 2015-12-15 DIAGNOSIS — M6080 Other myositis, unspecified site: Secondary | ICD-10-CM | POA: Diagnosis not present

## 2015-12-15 DIAGNOSIS — I10 Essential (primary) hypertension: Secondary | ICD-10-CM | POA: Diagnosis not present

## 2015-12-15 DIAGNOSIS — R0902 Hypoxemia: Secondary | ICD-10-CM | POA: Diagnosis not present

## 2016-01-04 DIAGNOSIS — G629 Polyneuropathy, unspecified: Secondary | ICD-10-CM | POA: Diagnosis not present

## 2016-01-04 DIAGNOSIS — E559 Vitamin D deficiency, unspecified: Secondary | ICD-10-CM | POA: Diagnosis not present

## 2016-01-04 DIAGNOSIS — Z79899 Other long term (current) drug therapy: Secondary | ICD-10-CM | POA: Diagnosis not present

## 2016-01-04 DIAGNOSIS — E1122 Type 2 diabetes mellitus with diabetic chronic kidney disease: Secondary | ICD-10-CM | POA: Diagnosis not present

## 2016-01-04 DIAGNOSIS — Z794 Long term (current) use of insulin: Secondary | ICD-10-CM | POA: Diagnosis not present

## 2016-01-04 DIAGNOSIS — J439 Emphysema, unspecified: Secondary | ICD-10-CM | POA: Diagnosis not present

## 2016-01-04 DIAGNOSIS — D649 Anemia, unspecified: Secondary | ICD-10-CM | POA: Diagnosis not present

## 2016-01-04 DIAGNOSIS — E782 Mixed hyperlipidemia: Secondary | ICD-10-CM | POA: Diagnosis not present

## 2016-01-04 DIAGNOSIS — R700 Elevated erythrocyte sedimentation rate: Secondary | ICD-10-CM | POA: Diagnosis not present

## 2016-01-04 DIAGNOSIS — N183 Chronic kidney disease, stage 3 (moderate): Secondary | ICD-10-CM | POA: Diagnosis not present

## 2016-01-11 DIAGNOSIS — M25512 Pain in left shoulder: Secondary | ICD-10-CM | POA: Diagnosis not present

## 2016-01-11 DIAGNOSIS — E119 Type 2 diabetes mellitus without complications: Secondary | ICD-10-CM | POA: Diagnosis not present

## 2016-01-11 DIAGNOSIS — Z794 Long term (current) use of insulin: Secondary | ICD-10-CM | POA: Diagnosis not present

## 2016-01-11 DIAGNOSIS — R700 Elevated erythrocyte sedimentation rate: Secondary | ICD-10-CM | POA: Diagnosis not present

## 2016-01-11 DIAGNOSIS — G8929 Other chronic pain: Secondary | ICD-10-CM | POA: Diagnosis not present

## 2016-01-11 DIAGNOSIS — R600 Localized edema: Secondary | ICD-10-CM | POA: Diagnosis not present

## 2016-01-11 DIAGNOSIS — J449 Chronic obstructive pulmonary disease, unspecified: Secondary | ICD-10-CM | POA: Diagnosis not present

## 2016-01-15 DIAGNOSIS — Z794 Long term (current) use of insulin: Secondary | ICD-10-CM | POA: Diagnosis not present

## 2016-01-15 DIAGNOSIS — M6080 Other myositis, unspecified site: Secondary | ICD-10-CM | POA: Diagnosis not present

## 2016-01-15 DIAGNOSIS — E1122 Type 2 diabetes mellitus with diabetic chronic kidney disease: Secondary | ICD-10-CM | POA: Diagnosis not present

## 2016-01-15 DIAGNOSIS — N183 Chronic kidney disease, stage 3 (moderate): Secondary | ICD-10-CM | POA: Diagnosis not present

## 2016-01-15 DIAGNOSIS — R0902 Hypoxemia: Secondary | ICD-10-CM | POA: Diagnosis not present

## 2016-01-15 DIAGNOSIS — I10 Essential (primary) hypertension: Secondary | ICD-10-CM | POA: Diagnosis not present

## 2016-01-17 DIAGNOSIS — H353211 Exudative age-related macular degeneration, right eye, with active choroidal neovascularization: Secondary | ICD-10-CM | POA: Diagnosis not present

## 2016-01-17 DIAGNOSIS — H353134 Nonexudative age-related macular degeneration, bilateral, advanced atrophic with subfoveal involvement: Secondary | ICD-10-CM | POA: Diagnosis not present

## 2016-02-12 DIAGNOSIS — M6080 Other myositis, unspecified site: Secondary | ICD-10-CM | POA: Diagnosis not present

## 2016-02-12 DIAGNOSIS — I10 Essential (primary) hypertension: Secondary | ICD-10-CM | POA: Diagnosis not present

## 2016-02-12 DIAGNOSIS — R0902 Hypoxemia: Secondary | ICD-10-CM | POA: Diagnosis not present

## 2016-02-15 DIAGNOSIS — M1902 Primary osteoarthritis, elbow: Secondary | ICD-10-CM | POA: Diagnosis not present

## 2016-02-15 DIAGNOSIS — M7552 Bursitis of left shoulder: Secondary | ICD-10-CM | POA: Diagnosis not present

## 2016-02-15 DIAGNOSIS — M19012 Primary osteoarthritis, left shoulder: Secondary | ICD-10-CM | POA: Diagnosis not present

## 2016-02-15 DIAGNOSIS — M1990 Unspecified osteoarthritis, unspecified site: Secondary | ICD-10-CM | POA: Diagnosis not present

## 2016-03-14 DIAGNOSIS — I10 Essential (primary) hypertension: Secondary | ICD-10-CM | POA: Diagnosis not present

## 2016-03-14 DIAGNOSIS — M6080 Other myositis, unspecified site: Secondary | ICD-10-CM | POA: Diagnosis not present

## 2016-03-14 DIAGNOSIS — R0902 Hypoxemia: Secondary | ICD-10-CM | POA: Diagnosis not present

## 2016-03-27 DIAGNOSIS — R0902 Hypoxemia: Secondary | ICD-10-CM | POA: Diagnosis not present

## 2016-03-27 DIAGNOSIS — R700 Elevated erythrocyte sedimentation rate: Secondary | ICD-10-CM | POA: Diagnosis not present

## 2016-03-27 DIAGNOSIS — J439 Emphysema, unspecified: Secondary | ICD-10-CM | POA: Diagnosis not present

## 2016-04-13 DIAGNOSIS — M6080 Other myositis, unspecified site: Secondary | ICD-10-CM | POA: Diagnosis not present

## 2016-04-13 DIAGNOSIS — I10 Essential (primary) hypertension: Secondary | ICD-10-CM | POA: Diagnosis not present

## 2016-04-13 DIAGNOSIS — R0902 Hypoxemia: Secondary | ICD-10-CM | POA: Diagnosis not present

## 2016-04-17 DIAGNOSIS — H353212 Exudative age-related macular degeneration, right eye, with inactive choroidal neovascularization: Secondary | ICD-10-CM | POA: Diagnosis not present

## 2016-04-17 DIAGNOSIS — H353134 Nonexudative age-related macular degeneration, bilateral, advanced atrophic with subfoveal involvement: Secondary | ICD-10-CM | POA: Diagnosis not present

## 2016-04-17 DIAGNOSIS — E119 Type 2 diabetes mellitus without complications: Secondary | ICD-10-CM | POA: Diagnosis not present

## 2016-04-17 DIAGNOSIS — H35721 Serous detachment of retinal pigment epithelium, right eye: Secondary | ICD-10-CM | POA: Diagnosis not present

## 2016-04-18 DIAGNOSIS — Z794 Long term (current) use of insulin: Secondary | ICD-10-CM | POA: Diagnosis not present

## 2016-04-18 DIAGNOSIS — N183 Chronic kidney disease, stage 3 (moderate): Secondary | ICD-10-CM | POA: Diagnosis not present

## 2016-04-18 DIAGNOSIS — E1122 Type 2 diabetes mellitus with diabetic chronic kidney disease: Secondary | ICD-10-CM | POA: Diagnosis not present

## 2016-05-14 DIAGNOSIS — R0902 Hypoxemia: Secondary | ICD-10-CM | POA: Diagnosis not present

## 2016-05-14 DIAGNOSIS — M6080 Other myositis, unspecified site: Secondary | ICD-10-CM | POA: Diagnosis not present

## 2016-05-14 DIAGNOSIS — I10 Essential (primary) hypertension: Secondary | ICD-10-CM | POA: Diagnosis not present

## 2016-06-13 DIAGNOSIS — I10 Essential (primary) hypertension: Secondary | ICD-10-CM | POA: Diagnosis not present

## 2016-06-13 DIAGNOSIS — M6080 Other myositis, unspecified site: Secondary | ICD-10-CM | POA: Diagnosis not present

## 2016-06-13 DIAGNOSIS — R0902 Hypoxemia: Secondary | ICD-10-CM | POA: Diagnosis not present

## 2016-07-03 DIAGNOSIS — N183 Chronic kidney disease, stage 3 (moderate): Secondary | ICD-10-CM | POA: Diagnosis not present

## 2016-07-03 DIAGNOSIS — R700 Elevated erythrocyte sedimentation rate: Secondary | ICD-10-CM | POA: Diagnosis not present

## 2016-07-03 DIAGNOSIS — E1122 Type 2 diabetes mellitus with diabetic chronic kidney disease: Secondary | ICD-10-CM | POA: Diagnosis not present

## 2016-07-03 DIAGNOSIS — F3342 Major depressive disorder, recurrent, in full remission: Secondary | ICD-10-CM | POA: Diagnosis not present

## 2016-07-03 DIAGNOSIS — Z79899 Other long term (current) drug therapy: Secondary | ICD-10-CM | POA: Diagnosis not present

## 2016-07-03 DIAGNOSIS — Z794 Long term (current) use of insulin: Secondary | ICD-10-CM | POA: Diagnosis not present

## 2016-07-03 DIAGNOSIS — J439 Emphysema, unspecified: Secondary | ICD-10-CM | POA: Diagnosis not present

## 2016-07-03 DIAGNOSIS — I10 Essential (primary) hypertension: Secondary | ICD-10-CM | POA: Diagnosis not present

## 2016-07-03 DIAGNOSIS — E782 Mixed hyperlipidemia: Secondary | ICD-10-CM | POA: Diagnosis not present

## 2016-07-03 DIAGNOSIS — G629 Polyneuropathy, unspecified: Secondary | ICD-10-CM | POA: Diagnosis not present

## 2016-07-03 DIAGNOSIS — M353 Polymyalgia rheumatica: Secondary | ICD-10-CM | POA: Diagnosis not present

## 2016-07-03 DIAGNOSIS — D631 Anemia in chronic kidney disease: Secondary | ICD-10-CM | POA: Diagnosis not present

## 2016-07-10 DIAGNOSIS — I10 Essential (primary) hypertension: Secondary | ICD-10-CM | POA: Diagnosis not present

## 2016-07-10 DIAGNOSIS — Z79899 Other long term (current) drug therapy: Secondary | ICD-10-CM | POA: Diagnosis not present

## 2016-07-10 DIAGNOSIS — E782 Mixed hyperlipidemia: Secondary | ICD-10-CM | POA: Diagnosis not present

## 2016-07-10 DIAGNOSIS — E1122 Type 2 diabetes mellitus with diabetic chronic kidney disease: Secondary | ICD-10-CM | POA: Diagnosis not present

## 2016-07-10 DIAGNOSIS — N183 Chronic kidney disease, stage 3 (moderate): Secondary | ICD-10-CM | POA: Diagnosis not present

## 2016-07-10 DIAGNOSIS — D631 Anemia in chronic kidney disease: Secondary | ICD-10-CM | POA: Diagnosis not present

## 2016-07-10 DIAGNOSIS — Z794 Long term (current) use of insulin: Secondary | ICD-10-CM | POA: Diagnosis not present

## 2016-07-10 DIAGNOSIS — M19012 Primary osteoarthritis, left shoulder: Secondary | ICD-10-CM | POA: Diagnosis not present

## 2016-07-14 DIAGNOSIS — I10 Essential (primary) hypertension: Secondary | ICD-10-CM | POA: Diagnosis not present

## 2016-07-14 DIAGNOSIS — R0902 Hypoxemia: Secondary | ICD-10-CM | POA: Diagnosis not present

## 2016-07-14 DIAGNOSIS — M6080 Other myositis, unspecified site: Secondary | ICD-10-CM | POA: Diagnosis not present

## 2016-07-19 DIAGNOSIS — E1122 Type 2 diabetes mellitus with diabetic chronic kidney disease: Secondary | ICD-10-CM | POA: Diagnosis not present

## 2016-07-19 DIAGNOSIS — E1165 Type 2 diabetes mellitus with hyperglycemia: Secondary | ICD-10-CM | POA: Diagnosis not present

## 2016-07-19 DIAGNOSIS — Z794 Long term (current) use of insulin: Secondary | ICD-10-CM | POA: Diagnosis not present

## 2016-07-19 DIAGNOSIS — N183 Chronic kidney disease, stage 3 (moderate): Secondary | ICD-10-CM | POA: Diagnosis not present

## 2016-07-19 DIAGNOSIS — N390 Urinary tract infection, site not specified: Secondary | ICD-10-CM | POA: Diagnosis not present

## 2016-08-10 ENCOUNTER — Inpatient Hospital Stay
Admission: EM | Admit: 2016-08-10 | Discharge: 2016-08-13 | DRG: 176 | Disposition: A | Discharge status: Home Health | Payer: Commercial Managed Care - HMO | Attending: Internal Medicine | Admitting: Internal Medicine

## 2016-08-10 ENCOUNTER — Emergency Department: Payer: Commercial Managed Care - HMO

## 2016-08-10 ENCOUNTER — Encounter: Payer: Self-pay | Admitting: Emergency Medicine

## 2016-08-10 DIAGNOSIS — F329 Major depressive disorder, single episode, unspecified: Secondary | ICD-10-CM | POA: Diagnosis present

## 2016-08-10 DIAGNOSIS — M8580 Other specified disorders of bone density and structure, unspecified site: Secondary | ICD-10-CM | POA: Diagnosis present

## 2016-08-10 DIAGNOSIS — G2581 Restless legs syndrome: Secondary | ICD-10-CM | POA: Diagnosis present

## 2016-08-10 DIAGNOSIS — Z9981 Dependence on supplemental oxygen: Secondary | ICD-10-CM

## 2016-08-10 DIAGNOSIS — Z8249 Family history of ischemic heart disease and other diseases of the circulatory system: Secondary | ICD-10-CM

## 2016-08-10 DIAGNOSIS — Z7952 Long term (current) use of systemic steroids: Secondary | ICD-10-CM | POA: Diagnosis not present

## 2016-08-10 DIAGNOSIS — M1990 Unspecified osteoarthritis, unspecified site: Secondary | ICD-10-CM | POA: Diagnosis present

## 2016-08-10 DIAGNOSIS — I129 Hypertensive chronic kidney disease with stage 1 through stage 4 chronic kidney disease, or unspecified chronic kidney disease: Secondary | ICD-10-CM | POA: Diagnosis present

## 2016-08-10 DIAGNOSIS — Z79899 Other long term (current) drug therapy: Secondary | ICD-10-CM

## 2016-08-10 DIAGNOSIS — Z85528 Personal history of other malignant neoplasm of kidney: Secondary | ICD-10-CM

## 2016-08-10 DIAGNOSIS — N903 Dysplasia of vulva, unspecified: Secondary | ICD-10-CM | POA: Diagnosis present

## 2016-08-10 DIAGNOSIS — I82412 Acute embolism and thrombosis of left femoral vein: Secondary | ICD-10-CM | POA: Diagnosis not present

## 2016-08-10 DIAGNOSIS — N183 Chronic kidney disease, stage 3 unspecified: Secondary | ICD-10-CM | POA: Diagnosis present

## 2016-08-10 DIAGNOSIS — Z7982 Long term (current) use of aspirin: Secondary | ICD-10-CM

## 2016-08-10 DIAGNOSIS — R0602 Shortness of breath: Secondary | ICD-10-CM | POA: Diagnosis not present

## 2016-08-10 DIAGNOSIS — Z87891 Personal history of nicotine dependence: Secondary | ICD-10-CM

## 2016-08-10 DIAGNOSIS — I10 Essential (primary) hypertension: Secondary | ICD-10-CM | POA: Diagnosis present

## 2016-08-10 DIAGNOSIS — E119 Type 2 diabetes mellitus without complications: Secondary | ICD-10-CM | POA: Diagnosis not present

## 2016-08-10 DIAGNOSIS — M353 Polymyalgia rheumatica: Secondary | ICD-10-CM | POA: Diagnosis present

## 2016-08-10 DIAGNOSIS — I2699 Other pulmonary embolism without acute cor pulmonale: Principal | ICD-10-CM | POA: Diagnosis present

## 2016-08-10 DIAGNOSIS — E785 Hyperlipidemia, unspecified: Secondary | ICD-10-CM | POA: Diagnosis present

## 2016-08-10 DIAGNOSIS — E559 Vitamin D deficiency, unspecified: Secondary | ICD-10-CM | POA: Diagnosis present

## 2016-08-10 DIAGNOSIS — F32A Depression, unspecified: Secondary | ICD-10-CM | POA: Diagnosis present

## 2016-08-10 DIAGNOSIS — Z794 Long term (current) use of insulin: Secondary | ICD-10-CM

## 2016-08-10 DIAGNOSIS — I82409 Acute embolism and thrombosis of unspecified deep veins of unspecified lower extremity: Secondary | ICD-10-CM | POA: Diagnosis not present

## 2016-08-10 DIAGNOSIS — Z888 Allergy status to other drugs, medicaments and biological substances status: Secondary | ICD-10-CM

## 2016-08-10 DIAGNOSIS — E1142 Type 2 diabetes mellitus with diabetic polyneuropathy: Secondary | ICD-10-CM | POA: Diagnosis present

## 2016-08-10 DIAGNOSIS — E1122 Type 2 diabetes mellitus with diabetic chronic kidney disease: Secondary | ICD-10-CM | POA: Diagnosis present

## 2016-08-10 DIAGNOSIS — J449 Chronic obstructive pulmonary disease, unspecified: Secondary | ICD-10-CM | POA: Diagnosis present

## 2016-08-10 DIAGNOSIS — Z96643 Presence of artificial hip joint, bilateral: Secondary | ICD-10-CM | POA: Diagnosis present

## 2016-08-10 DIAGNOSIS — L508 Other urticaria: Secondary | ICD-10-CM | POA: Diagnosis present

## 2016-08-10 DIAGNOSIS — L03116 Cellulitis of left lower limb: Secondary | ICD-10-CM | POA: Diagnosis not present

## 2016-08-10 DIAGNOSIS — M79662 Pain in left lower leg: Secondary | ICD-10-CM | POA: Diagnosis not present

## 2016-08-10 DIAGNOSIS — Z90710 Acquired absence of both cervix and uterus: Secondary | ICD-10-CM

## 2016-08-10 DIAGNOSIS — I82492 Acute embolism and thrombosis of other specified deep vein of left lower extremity: Secondary | ICD-10-CM | POA: Diagnosis not present

## 2016-08-10 DIAGNOSIS — Z905 Acquired absence of kidney: Secondary | ICD-10-CM

## 2016-08-10 DIAGNOSIS — N289 Disorder of kidney and ureter, unspecified: Secondary | ICD-10-CM

## 2016-08-10 DIAGNOSIS — Z7983 Long term (current) use of bisphosphonates: Secondary | ICD-10-CM

## 2016-08-10 DIAGNOSIS — Z23 Encounter for immunization: Secondary | ICD-10-CM

## 2016-08-10 DIAGNOSIS — Z86718 Personal history of other venous thrombosis and embolism: Secondary | ICD-10-CM

## 2016-08-10 DIAGNOSIS — Z9849 Cataract extraction status, unspecified eye: Secondary | ICD-10-CM

## 2016-08-10 DIAGNOSIS — Z833 Family history of diabetes mellitus: Secondary | ICD-10-CM

## 2016-08-10 HISTORY — DX: Disorder of kidney and ureter, unspecified: N28.9

## 2016-08-10 LAB — CBC WITH DIFFERENTIAL/PLATELET
Basophils Absolute: 0 10*3/uL (ref 0–0.1)
Basophils Relative: 0 %
Eosinophils Absolute: 0.2 10*3/uL (ref 0–0.7)
Eosinophils Relative: 1 %
HEMATOCRIT: 34.6 % — AB (ref 35.0–47.0)
HEMOGLOBIN: 11.4 g/dL — AB (ref 12.0–16.0)
LYMPHS ABS: 2.3 10*3/uL (ref 1.0–3.6)
LYMPHS PCT: 18 %
MCH: 29.5 pg (ref 26.0–34.0)
MCHC: 33 g/dL (ref 32.0–36.0)
MCV: 89.2 fL (ref 80.0–100.0)
MONOS PCT: 8 %
Monocytes Absolute: 1 10*3/uL — ABNORMAL HIGH (ref 0.2–0.9)
NEUTROS ABS: 9.2 10*3/uL — AB (ref 1.4–6.5)
NEUTROS PCT: 73 %
Platelets: 190 10*3/uL (ref 150–440)
RBC: 3.88 MIL/uL (ref 3.80–5.20)
RDW: 16.8 % — ABNORMAL HIGH (ref 11.5–14.5)
WBC: 12.7 10*3/uL — AB (ref 3.6–11.0)

## 2016-08-10 LAB — BASIC METABOLIC PANEL
Anion gap: 6 (ref 5–15)
BUN: 29 mg/dL — AB (ref 6–20)
CHLORIDE: 103 mmol/L (ref 101–111)
CO2: 29 mmol/L (ref 22–32)
CREATININE: 1.57 mg/dL — AB (ref 0.44–1.00)
Calcium: 9.1 mg/dL (ref 8.9–10.3)
GFR calc Af Amer: 34 mL/min — ABNORMAL LOW (ref >60)
GFR calc non Af Amer: 29 mL/min — ABNORMAL LOW (ref >60)
Glucose, Bld: 188 mg/dL — ABNORMAL HIGH (ref 65–99)
POTASSIUM: 4.2 mmol/L (ref 3.5–5.1)
Sodium: 138 mmol/L (ref 135–145)

## 2016-08-10 LAB — TROPONIN I

## 2016-08-10 LAB — BRAIN NATRIURETIC PEPTIDE: B NATRIURETIC PEPTIDE 5: 45 pg/mL (ref 0.0–100.0)

## 2016-08-10 LAB — GLUCOSE, CAPILLARY: Glucose-Capillary: 183 mg/dL — ABNORMAL HIGH (ref 65–99)

## 2016-08-10 MED ORDER — TECHNETIUM TC 99M DIETHYLENETRIAME-PENTAACETIC ACID
30.0000 | Freq: Once | INTRAVENOUS | Status: AC | PRN
  Administered 2016-08-10: 32.177 via INTRAVENOUS

## 2016-08-10 MED ORDER — ENOXAPARIN SODIUM 100 MG/ML ~~LOC~~ SOLN
1.0000 mg/kg | Freq: Once | SUBCUTANEOUS | Status: AC
  Administered 2016-08-10: 90 mg via SUBCUTANEOUS
  Filled 2016-08-10: qty 1

## 2016-08-10 MED ORDER — INSULIN ASPART 100 UNIT/ML ~~LOC~~ SOLN
2.0000 [IU] | Freq: Once | SUBCUTANEOUS | Status: AC
  Administered 2016-08-10: 2 [IU] via SUBCUTANEOUS
  Filled 2016-08-10: qty 2

## 2016-08-10 MED ORDER — SODIUM CHLORIDE 0.9 % IV BOLUS (SEPSIS): 500.0000 mL | Freq: Once | INTRAVENOUS | Status: DC

## 2016-08-10 MED ORDER — TECHNETIUM TO 99M ALBUMIN AGGREGATED
4.0000 | Freq: Once | INTRAVENOUS | Status: AC | PRN
  Administered 2016-08-10: 3.802 via INTRAVENOUS

## 2016-08-10 NOTE — ED Notes (Signed)
Pt to nm

## 2016-08-10 NOTE — ED Notes (Signed)
Pt eating food her daughter brought in to her with dr. permission

## 2016-08-10 NOTE — ED Triage Notes (Signed)
Pt has left leg swelling that started today. C/o pain to left leg as well. Daughter reports on fluid pills for this. Per daughter has had problems with swelling in only left leg. Denies SHOB.

## 2016-08-10 NOTE — ED Provider Notes (Signed)
[facility] Emergency Department Provider Note  ____________________________________________  Time seen: Approximately 6:15 PM  I have reviewed the triage vital signs and the nursing notes.   HISTORY  Chief Complaint Leg Swelling   HPI Tracy Mcmahon is a 82 y.o. female h/o CKD, DVT (2015) no longer on anticoagulation, COPD, PMR, hypertension, hyperlipidemia, diabetes who presents for evaluation of left lower extremity pain. Patient reports 2 days of progressively worsening swelling and pain of her left lower extremity. She is on 2 L nasal cannula at home and although she has not required more oxygen she complains of worsening shortness of breath as well. She denies chest pain. Patient does not have an IVC filter and is not anticoagulated. Patient reports that she also feels fatigued for the last week. She recently finished antibiotics for urinary tract infection. She reports that her pain is mild, moderate when she tries to bear weight, located in the left lower extremity from her thigh all the way to the back of her knee, constant, and dull. She denies fever or chills, nausea or vomiting.  Past Medical History:  Diagnosis Date  . Cancer ([place])   . Cataract   . Chronic kidney disease (CKD), stage III (moderate)   . Chronic urticaria   . COPD (chronic obstructive pulmonary disease) ([place])   . Depression   . Diabetes mellitus without complication ([place])   . DVT (deep venous thrombosis) ([facility])    left leg  . Hyperlipemia   . Hypertension   . Osteoarthritis   . Osteopenia   . Polymyalgia rheumatica ([place])   . Polyneuropathy ([place])   . Renal cancer ([place])   . Restless leg syndrome   . Vitamin D deficiency   . Vulvar intraepithelial neoplasia with lichen sclerosus     Patient Active Problem List   Diagnosis Date Noted  . COPD with acute exacerbation ([place]) 03/29/2015  . Hypertension 03/29/2015  . Diabetes ([place]) 03/29/2015  . Hyperlipidemia 03/29/2015  .  Depression 03/29/2015  . Restless leg syndrome 03/29/2015  . DVT (deep venous thrombosis) ([place]) 03/29/2015  . Chronic kidney disease 03/29/2015    Past Surgical History:  Procedure Laterality Date  . ABDOMINAL HYSTERECTOMY    . APPENDECTOMY    . CATARACT EXTRACTION    . COLONOSCOPY    . JOINT REPLACEMENT Bilateral    hip  . LAMINOTOMY    . NEPHRECTOMY    . TEMPORAL ARTERY BIOPSY / LIGATION    . TONSILLECTOMY      Prior to Admission medications   Medication Sig Start Date End Date Taking? Authorizing Provider  aspirin EC 81 MG tablet Take 81 mg by mouth daily.    Historical Provider, MD  atenolol (TENORMIN) 25 MG tablet Take 25 mg by mouth 2 (two) times daily.    Historical Provider, MD  Calcium Carb-Cholecalciferol (CALCIUM 600 + D PO) Take 1 tablet by mouth daily.    Historical Provider, MD  FLUoxetine (PROZAC) 20 MG capsule Take 20 mg by mouth daily.    Historical Provider, MD  gabapentin (NEURONTIN) 300 MG capsule Take 300-600 mg by mouth 3 (three) times daily. Pt takes one in the morning, one in the evening, and two at bedtime.    Historical Provider, MD  hydrochlorothiazide (HYDRODIURIL) 25 MG tablet Take 12.5 mg by mouth daily.    Historical Provider, MD  insulin aspart (NOVOLOG) 100 UNIT/ML injection Inject 0-10 Units into the skin See admin instructions. Pt uses as needed per sliding scale.  Historical Provider, MD  insulin glargine (LANTUS) 100 UNIT/ML injection Inject 70 Units into the skin daily.    Historical Provider, MD  levofloxacin (LEVAQUIN) 250 MG tablet Take 1 tablet (250 mg total) by mouth daily. 03/30/15   Bettey Costa, MD  pravastatin (PRAVACHOL) 20 MG tablet Take 20 mg by mouth daily.    Historical Provider, MD  predniSONE (DELTASONE) 5 MG tablet Take 10 mg by mouth daily.    Historical Provider, MD  predniSONE (DELTASONE) 5 MG tablet Label  & dispense according to the schedule below. 10 Pills PO for 3 days then, 8 Pills PO for 3 days, 6 Pills PO for 3 days, 4  Pills PO for 3 days, 2 Pills PO daily as previously prescribed 03/30/15   Bettey Costa, MD  vitamin B-12 (CYANOCOBALAMIN) 1000 MCG tablet Take 1,000 mcg by mouth daily.    Historical Provider, MD  Vitamin D, Cholecalciferol, 1000 UNITS TABS Take 1 tablet by mouth daily.    Historical Provider, MD  Vitamin D, Ergocalciferol, (DRISDOL) 50000 UNITS CAPS capsule Take 50,000 Units by mouth every 7 (seven) days.    Historical Provider, MD    Allergies Fosamax [alendronate sodium] and Wellbutrin [bupropion]  Family History  Problem Relation Age of Onset  . Hypertension    . Diabetes      Social History Social History  Substance Use Topics  . Smoking status: Former Research scientist (life sciences)  . Smokeless tobacco: Not on file  . Alcohol use 0.0 oz/week    Review of Systems  Constitutional: Negative for fever. Eyes: Negative for visual changes. ENT: Negative for sore throat. Cardiovascular: Negative for chest pain. Respiratory: + shortness of breath. Gastrointestinal: Negative for abdominal pain, vomiting or diarrhea. Genitourinary: Negative for dysuria. Musculoskeletal: Negative for back pain. + LLE pain and swelling Skin: Negative for rash. Neurological: Negative for headaches, weakness or numbness.  ____________________________________________   PHYSICAL EXAM:  VITAL SIGNS: ED Triage Vitals  Enc Vitals Group     BP 08/10/16 1623 (!) 106/57     Pulse Rate 08/10/16 1623 94     Resp 08/10/16 1623 20     Temp 08/10/16 1623 98.2 F (36.8 C)     Temp Source 08/10/16 1623 Oral     SpO2 08/10/16 1623 97 %     Weight 08/10/16 1621 200 lb (90.7 kg)     Height 08/10/16 1621 5' (1.524 m)     Head Circumference --      Peak Flow --      Pain Score 08/10/16 1622 5     Pain Loc --      Pain Edu? --      Excl. in [place]? --     Constitutional: Alert and oriented. Well appearing and in no apparent distress. HEENT:      Head: Normocephalic and atraumatic.         Eyes: Conjunctivae are normal. Sclera is  non-icteric. EOMI. PERRL      Mouth/Throat: Mucous membranes are moist.       Neck: Supple with no signs of meningismus. Cardiovascular: Regular rate and rhythm. No murmurs, gallops, or rubs. 2+ symmetrical distal pulses are present in all extremities. No JVD. Respiratory: Normal respiratory effort. Lungs are clear to auscultation bilaterally. No wheezes, crackles, or rhonchi.  Gastrointestinal: Soft, non tender, and non distended with positive bowel sounds. No rebound or guarding. Musculoskeletal: 1+ pitting edema and enlargement of patient's left leg when compared to the right, strong distal pulses, no warmth or erythema.  Neurologic: Normal speech and language. Face is symmetric. Moving all extremities. No gross focal neurologic deficits are appreciated. Skin: Skin is warm, dry and intact. No rash noted. Psychiatric: Mood and affect are normal. Speech and behavior are normal.  ____________________________________________   LABS (all labs ordered are listed, but only abnormal results are displayed)  Labs Reviewed  CBC WITH DIFFERENTIAL/PLATELET - Abnormal; Notable for the following:       Result Value   WBC 12.7 (*)    Hemoglobin 11.4 (*)    HCT 34.6 (*)    RDW 16.8 (*)    Neutro Abs 9.2 (*)    Monocytes Absolute 1.0 (*)    All other components within normal limits  BASIC METABOLIC PANEL - Abnormal; Notable for the following:    Glucose, Bld 188 (*)    BUN 29 (*)    Creatinine, Ser 1.57 (*)    GFR calc non Af Amer 29 (*)    GFR calc Af Amer 34 (*)    All other components within normal limits  TROPONIN I  BRAIN NATRIURETIC PEPTIDE   ____________________________________________  EKG  ED ECG REPORT I, Rudene Re, the attending physician, personally viewed and interpreted this ECG.  Normal sinus rhythm, rate of 66, left bundle branch block, normal QTC, left axis deviation, no ST elevations or depressions. Unchanged from prior.    ____________________________________________  RADIOLOGY  Doppler LLE:  Evidence of acute nonocclusive thrombus from the common femoral vein to the popliteal vein with extension to the greater saphenous and profunda femoral veins. ____________________________________________   PROCEDURES  Procedure(s) performed: None Procedures Critical Care performed:  None ____________________________________________   INITIAL IMPRESSION / ASSESSMENT AND PLAN / ED COURSE  82 y.o. female h/o CKD, DVT (2015) no longer on anticoagulation, COPD, PMR, hypertension, hyperlipidemia, diabetes who presents for evaluation of left lower extremity pain x 2 days and worsening SOB although no new oxygen demand. The patient with swelling and pitting edema of the left lower extremity, negative Homans sign, vital signs within normal limits. Doppler concerning for acute DVT from the common femoral vein to the popliteal vein extending into the greater saphenous and profundus femoral veins. We'll start patient on Lovenox 1 minute per caked subcutaneous. Patient will need evaluation for a PE as she is complaining of worsening shortness of breath however patient has only one kidney as she lost one to cancer and her kidney function is slightly elevated at 1.5 (1.3 is her baseline). Therefore I do believe patient will benefit from a VQ scan instead of a CTA. We'll admit to the hospitalist at this time.  Clinical Course    Pertinent labs & imaging results that were available during my care of the patient were reviewed by me and considered in my medical decision making (see chart for details).    ____________________________________________   FINAL CLINICAL IMPRESSION(S) / ED DIAGNOSES  Final diagnoses:  Acute deep vein thrombosis (DVT) of femoral vein of left lower extremity ([facility])      NEW MEDICATIONS STARTED DURING THIS VISIT:  New Prescriptions   No medications on file     Note:  This document was prepared  using Dragon voice recognition software and may include unintentional dictation errors.    Rudene Re, MD 08/10/16 1840

## 2016-08-10 NOTE — ED Notes (Signed)
Pt remains in vq scan.

## 2016-08-11 ENCOUNTER — Encounter: Payer: Self-pay | Admitting: Internal Medicine

## 2016-08-11 ENCOUNTER — Inpatient Hospital Stay
Admit: 2016-08-11 | Discharge: 2016-08-11 | Disposition: A | Discharge status: Home / Self Care | Payer: Commercial Managed Care - HMO | Attending: Internal Medicine | Admitting: Internal Medicine

## 2016-08-11 DIAGNOSIS — E559 Vitamin D deficiency, unspecified: Secondary | ICD-10-CM | POA: Diagnosis present

## 2016-08-11 DIAGNOSIS — Z9981 Dependence on supplemental oxygen: Secondary | ICD-10-CM | POA: Diagnosis not present

## 2016-08-11 DIAGNOSIS — E785 Hyperlipidemia, unspecified: Secondary | ICD-10-CM | POA: Diagnosis present

## 2016-08-11 DIAGNOSIS — E1122 Type 2 diabetes mellitus with diabetic chronic kidney disease: Secondary | ICD-10-CM | POA: Diagnosis present

## 2016-08-11 DIAGNOSIS — Z96643 Presence of artificial hip joint, bilateral: Secondary | ICD-10-CM | POA: Diagnosis present

## 2016-08-11 DIAGNOSIS — L03116 Cellulitis of left lower limb: Secondary | ICD-10-CM | POA: Diagnosis present

## 2016-08-11 DIAGNOSIS — Z7952 Long term (current) use of systemic steroids: Secondary | ICD-10-CM | POA: Diagnosis not present

## 2016-08-11 DIAGNOSIS — N183 Chronic kidney disease, stage 3 (moderate): Secondary | ICD-10-CM | POA: Diagnosis present

## 2016-08-11 DIAGNOSIS — M8580 Other specified disorders of bone density and structure, unspecified site: Secondary | ICD-10-CM | POA: Diagnosis present

## 2016-08-11 DIAGNOSIS — F329 Major depressive disorder, single episode, unspecified: Secondary | ICD-10-CM | POA: Diagnosis present

## 2016-08-11 DIAGNOSIS — I2699 Other pulmonary embolism without acute cor pulmonale: Secondary | ICD-10-CM | POA: Diagnosis present

## 2016-08-11 DIAGNOSIS — Z7982 Long term (current) use of aspirin: Secondary | ICD-10-CM | POA: Diagnosis not present

## 2016-08-11 DIAGNOSIS — L508 Other urticaria: Secondary | ICD-10-CM | POA: Diagnosis present

## 2016-08-11 DIAGNOSIS — I82412 Acute embolism and thrombosis of left femoral vein: Secondary | ICD-10-CM | POA: Diagnosis present

## 2016-08-11 DIAGNOSIS — M353 Polymyalgia rheumatica: Secondary | ICD-10-CM | POA: Diagnosis present

## 2016-08-11 DIAGNOSIS — N903 Dysplasia of vulva, unspecified: Secondary | ICD-10-CM | POA: Diagnosis present

## 2016-08-11 DIAGNOSIS — I129 Hypertensive chronic kidney disease with stage 1 through stage 4 chronic kidney disease, or unspecified chronic kidney disease: Secondary | ICD-10-CM | POA: Diagnosis present

## 2016-08-11 DIAGNOSIS — Z7983 Long term (current) use of bisphosphonates: Secondary | ICD-10-CM | POA: Diagnosis not present

## 2016-08-11 DIAGNOSIS — M79662 Pain in left lower leg: Secondary | ICD-10-CM | POA: Diagnosis present

## 2016-08-11 DIAGNOSIS — Z794 Long term (current) use of insulin: Secondary | ICD-10-CM | POA: Diagnosis not present

## 2016-08-11 DIAGNOSIS — E1142 Type 2 diabetes mellitus with diabetic polyneuropathy: Secondary | ICD-10-CM | POA: Diagnosis present

## 2016-08-11 DIAGNOSIS — G2581 Restless legs syndrome: Secondary | ICD-10-CM | POA: Diagnosis present

## 2016-08-11 DIAGNOSIS — Z23 Encounter for immunization: Secondary | ICD-10-CM | POA: Diagnosis not present

## 2016-08-11 DIAGNOSIS — M1990 Unspecified osteoarthritis, unspecified site: Secondary | ICD-10-CM | POA: Diagnosis present

## 2016-08-11 DIAGNOSIS — J449 Chronic obstructive pulmonary disease, unspecified: Secondary | ICD-10-CM | POA: Diagnosis present

## 2016-08-11 LAB — PROTIME-INR
INR: 1.11
PROTHROMBIN TIME: 14.3 s (ref 11.4–15.2)

## 2016-08-11 LAB — APTT: aPTT: 40 seconds — ABNORMAL HIGH (ref 24–36)

## 2016-08-11 LAB — GLUCOSE, CAPILLARY
GLUCOSE-CAPILLARY: 166 mg/dL — AB (ref 65–99)
GLUCOSE-CAPILLARY: 204 mg/dL — AB (ref 65–99)
GLUCOSE-CAPILLARY: 222 mg/dL — AB (ref 65–99)
Glucose-Capillary: 239 mg/dL — ABNORMAL HIGH (ref 65–99)

## 2016-08-11 LAB — CBC
HEMATOCRIT: 32.6 % — AB (ref 35.0–47.0)
HEMOGLOBIN: 10.6 g/dL — AB (ref 12.0–16.0)
MCH: 29.3 pg (ref 26.0–34.0)
MCHC: 32.6 g/dL (ref 32.0–36.0)
MCV: 90 fL (ref 80.0–100.0)
Platelets: 171 10*3/uL (ref 150–440)
RBC: 3.63 MIL/uL — ABNORMAL LOW (ref 3.80–5.20)
RDW: 17 % — AB (ref 11.5–14.5)
WBC: 11.2 10*3/uL — ABNORMAL HIGH (ref 3.6–11.0)

## 2016-08-11 LAB — ECHOCARDIOGRAM COMPLETE
Height: 60 in
Weight: 3104 oz

## 2016-08-11 LAB — BASIC METABOLIC PANEL
ANION GAP: 7 (ref 5–15)
BUN: 31 mg/dL — AB (ref 6–20)
CHLORIDE: 101 mmol/L (ref 101–111)
CO2: 32 mmol/L (ref 22–32)
Calcium: 8.7 mg/dL — ABNORMAL LOW (ref 8.9–10.3)
Creatinine, Ser: 1.56 mg/dL — ABNORMAL HIGH (ref 0.44–1.00)
GFR calc Af Amer: 34 mL/min — ABNORMAL LOW (ref >60)
GFR, EST NON AFRICAN AMERICAN: 29 mL/min — AB (ref >60)
GLUCOSE: 231 mg/dL — AB (ref 65–99)
POTASSIUM: 4.1 mmol/L (ref 3.5–5.1)
Sodium: 140 mmol/L (ref 135–145)

## 2016-08-11 LAB — HEPARIN LEVEL (UNFRACTIONATED): HEPARIN UNFRACTIONATED: 0.89 [IU]/mL — AB (ref 0.30–0.70)

## 2016-08-11 MED ORDER — ACETAMINOPHEN 650 MG RE SUPP
650.0000 mg | Freq: Four times a day (QID) | RECTAL | Status: DC | PRN
Start: 2016-08-11 — End: 2016-08-13

## 2016-08-11 MED ORDER — FLUOXETINE HCL 20 MG PO CAPS
20.0000 mg | ORAL_CAPSULE | Freq: Every day | ORAL | Status: DC
  Administered 2016-08-11 – 2016-08-13 (×3): 20 mg via ORAL
  Filled 2016-08-11 (×3): qty 1

## 2016-08-11 MED ORDER — CEFAZOLIN IN D5W 1 GM/50ML IV SOLN
1.0000 g | Freq: Three times a day (TID) | INTRAVENOUS | Status: DC
Start: 2016-08-11 — End: 2016-08-11
  Administered 2016-08-11: 1 g via INTRAVENOUS
  Filled 2016-08-11 (×2): qty 50

## 2016-08-11 MED ORDER — WARFARIN SODIUM 2.5 MG PO TABS
2.5000 mg | ORAL_TABLET | Freq: Every day | ORAL | Status: DC
  Administered 2016-08-11: 2.5 mg via ORAL
  Filled 2016-08-11: qty 1

## 2016-08-11 MED ORDER — WARFARIN - PHARMACIST DOSING INPATIENT: Freq: Every day | Status: DC

## 2016-08-11 MED ORDER — HEPARIN (PORCINE) IN NACL 100-0.45 UNIT/ML-% IJ SOLN
1100.0000 [IU]/h | INTRAMUSCULAR | Status: AC
  Administered 2016-08-11 – 2016-08-12 (×2): 1100 [IU]/h via INTRAVENOUS
  Filled 2016-08-11 (×3): qty 250

## 2016-08-11 MED ORDER — MORPHINE SULFATE (PF) 2 MG/ML IV SOLN
2.0000 mg | Freq: Once | INTRAVENOUS | Status: AC
  Administered 2016-08-11: 2 mg via INTRAVENOUS
  Filled 2016-08-11: qty 1

## 2016-08-11 MED ORDER — SODIUM CHLORIDE 0.9% FLUSH
3.0000 mL | Freq: Two times a day (BID) | INTRAVENOUS | Status: DC
  Administered 2016-08-11 – 2016-08-13 (×4): 3 mL via INTRAVENOUS

## 2016-08-11 MED ORDER — PRAVASTATIN SODIUM 20 MG PO TABS
20.0000 mg | ORAL_TABLET | Freq: Every day | ORAL | Status: DC
  Administered 2016-08-11 – 2016-08-12 (×2): 20 mg via ORAL
  Filled 2016-08-11 (×2): qty 1

## 2016-08-11 MED ORDER — INSULIN ASPART 100 UNIT/ML ~~LOC~~ SOLN
0.0000 [IU] | Freq: Three times a day (TID) | SUBCUTANEOUS | Status: DC
  Administered 2016-08-11: 2 [IU] via SUBCUTANEOUS
  Administered 2016-08-11 – 2016-08-12 (×4): 3 [IU] via SUBCUTANEOUS
  Administered 2016-08-12 – 2016-08-13 (×2): 2 [IU] via SUBCUTANEOUS
  Administered 2016-08-13: 3 [IU] via SUBCUTANEOUS
  Filled 2016-08-11: qty 2
  Filled 2016-08-11: qty 1
  Filled 2016-08-11 (×4): qty 3
  Filled 2016-08-11 (×2): qty 2

## 2016-08-11 MED ORDER — GABAPENTIN 300 MG PO CAPS
600.0000 mg | ORAL_CAPSULE | Freq: Every day | ORAL | Status: DC
  Administered 2016-08-11 – 2016-08-12 (×2): 600 mg via ORAL
  Filled 2016-08-11 (×2): qty 2

## 2016-08-11 MED ORDER — ATENOLOL 25 MG PO TABS
25.0000 mg | ORAL_TABLET | Freq: Two times a day (BID) | ORAL | Status: DC
Start: 2016-08-11 — End: 2016-08-13
  Administered 2016-08-11 – 2016-08-13 (×5): 25 mg via ORAL
  Filled 2016-08-11 (×7): qty 1

## 2016-08-11 MED ORDER — ASPIRIN EC 81 MG PO TBEC
81.0000 mg | DELAYED_RELEASE_TABLET | Freq: Every day | ORAL | Status: DC
  Administered 2016-08-11 – 2016-08-13 (×3): 81 mg via ORAL
  Filled 2016-08-11 (×3): qty 1

## 2016-08-11 MED ORDER — HEPARIN (PORCINE) IN NACL 100-0.45 UNIT/ML-% IJ SOLN: 1100.0000 [IU]/h | INTRAMUSCULAR | Status: DC

## 2016-08-11 MED ORDER — ACETAMINOPHEN 325 MG PO TABS: 650.0000 mg | ORAL_TABLET | Freq: Four times a day (QID) | ORAL | Status: DC | PRN

## 2016-08-11 MED ORDER — FUROSEMIDE 20 MG PO TABS
20.0000 mg | ORAL_TABLET | Freq: Every day | ORAL | Status: DC
  Administered 2016-08-11 – 2016-08-13 (×3): 20 mg via ORAL
  Filled 2016-08-11 (×3): qty 1

## 2016-08-11 MED ORDER — GABAPENTIN 300 MG PO CAPS: 300.0000 mg | ORAL_CAPSULE | Freq: Two times a day (BID) | ORAL | Status: DC

## 2016-08-11 MED ORDER — INSULIN ASPART 100 UNIT/ML ~~LOC~~ SOLN
0.0000 [IU] | Freq: Every day | SUBCUTANEOUS | Status: DC
  Administered 2016-08-11: 2 [IU] via SUBCUTANEOUS
  Administered 2016-08-12: 3 [IU] via SUBCUTANEOUS
  Filled 2016-08-11: qty 3
  Filled 2016-08-11: qty 2

## 2016-08-11 MED ORDER — GABAPENTIN 300 MG PO CAPS
300.0000 mg | ORAL_CAPSULE | Freq: Every day | ORAL | Status: DC
  Administered 2016-08-11 – 2016-08-13 (×3): 300 mg via ORAL
  Filled 2016-08-11 (×3): qty 1

## 2016-08-11 MED ORDER — OXYCODONE HCL 5 MG PO TABS
5.0000 mg | ORAL_TABLET | ORAL | Status: DC | PRN
  Administered 2016-08-11 – 2016-08-12 (×2): 5 mg via ORAL
  Filled 2016-08-11 (×2): qty 1

## 2016-08-11 MED ORDER — CEFAZOLIN IN D5W 1 GM/50ML IV SOLN
1.0000 g | Freq: Two times a day (BID) | INTRAVENOUS | Status: DC
  Administered 2016-08-11 – 2016-08-12 (×3): 1 g via INTRAVENOUS
  Filled 2016-08-11 (×5): qty 50

## 2016-08-11 MED ORDER — INFLUENZA VAC SPLIT QUAD 0.5 ML IM SUSY
0.5000 mL | PREFILLED_SYRINGE | INTRAMUSCULAR | Status: AC
  Administered 2016-08-12: 0.5 mL via INTRAMUSCULAR
  Filled 2016-08-11: qty 0.5

## 2016-08-11 MED ORDER — PREDNISONE 10 MG PO TABS
5.0000 mg | ORAL_TABLET | Freq: Every day | ORAL | Status: DC
  Administered 2016-08-11 – 2016-08-13 (×3): 5 mg via ORAL
  Filled 2016-08-11 (×2): qty 1
  Filled 2016-08-11: qty 2

## 2016-08-11 NOTE — Progress Notes (Signed)
ANTICOAGULATION CONSULT NOTE - Initial Consult  Pharmacy Consult for heparin drip Indication: pulmonary embolus  Allergies  Allergen Reactions  . Fosamax [Alendronate Sodium] Other (See Comments)    Reaction:  Made pt "very sick"  . Wellbutrin [Bupropion] Other (See Comments)    Reaction:  Unknown    Patient Measurements: Height: 5' (152.4 cm) Weight: 194 lb (88 kg) IBW/kg (Calculated) : 45.5 Heparin Dosing Weight: 66 kg  Vital Signs: Temp: 98.7 F (37.1 C) (09/17 0719) Temp Source: Oral (09/17 0719) BP: 125/47 (09/17 0719) Pulse Rate: 75 (09/17 0719)  Labs:  Recent Labs  08/10/16 1634 08/11/16 0439  HGB 11.4* 10.6*  HCT 34.6* 32.6*  PLT 190 171  APTT  --  40*  LABPROT  --  14.3  INR  --  1.11  HEPARINUNFRC  --  0.89*  CREATININE 1.57* 1.56*  TROPONINI <0.03  --     Estimated Creatinine Clearance: 26 mL/min (by C-G formula based on SCr of 1.56 mg/dL (H)).   Medical History: Past Medical History:  Diagnosis Date  . Cancer ([place])   . Cataract   . Chronic kidney disease (CKD), stage III (moderate)   . Chronic urticaria   . COPD (chronic obstructive pulmonary disease) ([place])   . Depression   . Diabetes mellitus without complication ([place])   . DVT (deep venous thrombosis) ([facility])    left leg  . Hyperlipemia   . Hypertension   . Osteoarthritis   . Osteopenia   . Polymyalgia rheumatica ([place])   . Polyneuropathy ([place])   . Renal cancer ([place])   . Renal insufficiency 08/10/2016   Chart indicates CKD stage 3  . Restless leg syndrome   . Vitamin D deficiency   . Vulvar intraepithelial neoplasia with lichen sclerosus     Assessment: Pharmacy consulted to dose and monitor heparin drip in this 82 year old female diagnosed with a PE. Patient received one treatment dose of enoxaparin 90 mg subcutaneously on 9/16 @ 1819 in the ED. Patient was not taking anticoagulants prior to admission.  Baseline labs were obtained but drawn after enoxaparin dose was given.  Goal  of Therapy:  Heparin level 0.3-0.7 units/ml Monitor platelets by anticoagulation protocol: Yes   Plan:  Enoxaparin dose will provide full anticoagulation for 24 hours. Will start heparin drip @ 1800 this evening with no bolus Give NO units bolus Start heparin infusion at 1100 units/hr Check anti-Xa level in 8 hours and daily while on heparin Continue to monitor H&H and platelets  Lenis Noon, PharmD, BCPS Clinical Pharmacist 08/11/2016,10:14 AM

## 2016-08-11 NOTE — ED Notes (Signed)
Pt returned from vq scan. Pt repositioned in bed for comfort.

## 2016-08-11 NOTE — ED Notes (Signed)
Admitting Dr at bedside

## 2016-08-11 NOTE — H&P (Signed)
[facility] [name]    MR#:  [id number]  DATE OF BIRTH:  [birth year]  DATE OF ADMISSION:  08/10/2016  PRIMARY CARE PHYSICIAN: Tracy Kayser, MD   REQUESTING/REFERRING PHYSICIAN: Dahlia Client, MD  CHIEF COMPLAINT:   Chief Complaint  Patient presents with  . Leg Swelling    HISTORY OF PRESENT ILLNESS:  Tracy Mcmahon  is a 82 y.o. female who presents with Lower extremity pain and swelling as well as some mildly increased shortness of breath for the last 2 days. She denies any fever, chills, or other symptoms of infection. She has COPD and wears 2 L of oxygen at home at baseline, and has not had to increase this, but does report some mildly increased shortness of breath with exertion. Here in the ED she was found to have stable labs, but did have a DVT found in her left lower extremity. VQ scan was performed due to the fact that the patient has only one kidney, and she was found he had intermediate risk for PE in the left lower lobe and the right upper lobe. Hospitalists were called for admission  PAST MEDICAL HISTORY:   Past Medical History:  Diagnosis Date  . Cancer ([place])   . Cataract   . Chronic kidney disease (CKD), stage III (moderate)   . Chronic urticaria   . COPD (chronic obstructive pulmonary disease) ([place])   . Depression   . Diabetes mellitus without complication ([place])   . DVT (deep venous thrombosis) (H[facility])    left leg  . Hyperlipemia   . Hypertension   . Osteoarthritis   . Osteopenia   . Polymyalgia rheumatica ([place])   . Polyneuropathy ([place])   . Renal cancer ([place])   . Renal insufficiency 08/10/2016   Chart indicates CKD stage 3  . Restless leg syndrome   . Vitamin D deficiency   . Vulvar intraepithelial neoplasia with lichen sclerosus     PAST SURGICAL HISTORY:   Past Surgical History:  Procedure Laterality Date  . ABDOMINAL HYSTERECTOMY    . APPENDECTOMY    . CATARACT EXTRACTION    . COLONOSCOPY     . JOINT REPLACEMENT Bilateral    hip  . LAMINOTOMY    . NEPHRECTOMY    . TEMPORAL ARTERY BIOPSY / LIGATION    . TONSILLECTOMY      SOCIAL HISTORY:   Social History  Substance Use Topics  . Smoking status: Former Research scientist (life sciences)  . Smokeless tobacco: Not on file  . Alcohol use 0.0 oz/week    FAMILY HISTORY:   Family History  Problem Relation Age of Onset  . Hypertension    . Diabetes      DRUG ALLERGIES:   Allergies  Allergen Reactions  . Fosamax [Alendronate Sodium] Other (See Comments)    Reaction:  Made pt "very sick"  . Wellbutrin [Bupropion] Other (See Comments)    Reaction:  Unknown    MEDICATIONS AT HOME:   Prior to Admission medications   Medication Sig Start Date End Date Taking? Authorizing Provider  alendronate (FOSAMAX) 35 MG tablet Take 35 mg by mouth every 7 (seven) days. Take with a full glass of water on an empty stomach.   Yes Historical Provider, MD  aspirin EC 81 MG tablet Take 81 mg by mouth daily.   Yes Historical Provider, MD  atenolol (TENORMIN) 25 MG tablet Take 25 mg by mouth 2 (two) times daily.   Yes Historical Provider, MD  FLUoxetine (PROZAC) 20 MG capsule Take 20 mg by mouth daily.   Yes Historical Provider, MD  furosemide (LASIX) 20 MG tablet Take 20 mg by mouth daily.   Yes Historical Provider, MD  gabapentin (NEURONTIN) 300 MG capsule Take 300-600 mg by mouth 3 (three) times daily. 1 capsule by mouth in the morning and 2 at bedtime   Yes Historical Provider, MD  insulin aspart (NOVOLOG) 100 UNIT/ML injection Inject 0-20 Units into the skin See admin instructions. Pt uses as needed per sliding scale. Twice daily   Yes Historical Provider, MD  insulin glargine (LANTUS) 100 UNIT/ML injection Inject 80 Units into the skin daily. 80 units at 1300 given as 40 units in two different sites   Yes Historical Provider, MD  Multiple Minerals-Vitamins (GNP CAL MAG ZINC +D3 PO) Take 1 tablet by mouth daily.   Yes Historical Provider, MD  pravastatin  (PRAVACHOL) 20 MG tablet Take 20 mg by mouth daily.   Yes Historical Provider, MD  predniSONE (DELTASONE) 5 MG tablet Take 5 mg by mouth daily.    Yes Historical Provider, MD  vitamin B-12 (CYANOCOBALAMIN) 1000 MCG tablet Take 1,000 mcg by mouth daily.   Yes Historical Provider, MD  Vitamin D, Ergocalciferol, (DRISDOL) 50000 UNITS CAPS capsule Take 50,000 Units by mouth every 7 (seven) days.   Yes Historical Provider, MD    REVIEW OF SYSTEMS:  Review of Systems  Constitutional: Negative for chills, fever, malaise/fatigue and weight loss.  HENT: Negative for ear pain, hearing loss and tinnitus.   Eyes: Negative for blurred vision, double vision, pain and redness.  Respiratory: Positive for shortness of breath. Negative for cough and hemoptysis.   Cardiovascular: Positive for leg swelling. Negative for chest pain, palpitations and orthopnea.  Gastrointestinal: Negative for abdominal pain, constipation, diarrhea, nausea and vomiting.  Genitourinary: Negative for dysuria, frequency and hematuria.  Musculoskeletal: Negative for back pain, joint pain and neck pain.  Skin:       No acne, rash, or lesions  Neurological: Negative for dizziness, tremors, focal weakness and weakness.  Endo/Heme/Allergies: Negative for polydipsia. Does not bruise/bleed easily.  Psychiatric/Behavioral: Negative for depression. The patient is not nervous/anxious and does not have insomnia.      VITAL SIGNS:   Vitals:   08/10/16 2130 08/10/16 2145 08/11/16 0030 08/11/16 0100  BP: (!) 108/51  (!) 127/58 (!) 123/54  Pulse:  68 66 70  Resp:      Temp:      TempSrc:      SpO2:  97% 100% 99%  Weight:      Height:       Wt Readings from Last 3 Encounters:  08/10/16 90.7 kg (200 lb)  03/30/15 91.9 kg (202 lb 11.2 oz)    PHYSICAL EXAMINATION:  Physical Exam  Vitals reviewed. Constitutional: She is oriented to person, place, and time. She appears well-developed and well-nourished. No distress.  HENT:  Mcmahon:  Normocephalic and atraumatic.  Mouth/Throat: Oropharynx is clear and moist.  Eyes: Conjunctivae and EOM are normal. Pupils are equal, round, and reactive to light. No scleral icterus.  Neck: Normal range of motion. Neck supple. No JVD present. No thyromegaly present.  Cardiovascular: Normal rate, regular rhythm and intact distal pulses.  Exam reveals no gallop and no friction rub.   No murmur heard. Respiratory: Effort normal and breath sounds normal. No respiratory distress. She has no wheezes. She has no rales.  GI: Soft. Bowel sounds are normal. She exhibits no distension. There is no  tenderness.  Musculoskeletal: Normal range of motion. She exhibits edema (Left lower extremity) and tenderness (left lower extremity).  No arthritis, no gout  Lymphadenopathy:    She has no cervical adenopathy.  Neurological: She is alert and oriented to person, place, and time. No cranial nerve deficit.  No dysarthria, no aphasia  Skin: Skin is warm and dry. No rash noted. No erythema.  Psychiatric: She has a normal mood and affect. Her behavior is normal. Judgment and thought content normal.    LABORATORY PANEL:   CBC  Recent Labs Lab 08/10/16 1634  WBC 12.7*  HGB 11.4*  HCT 34.6*  PLT 190   ------------------------------------------------------------------------------------------------------------------  Chemistries   Recent Labs Lab 08/10/16 1634  NA 138  K 4.2  CL 103  CO2 29  GLUCOSE 188*  BUN 29*  CREATININE 1.57*  CALCIUM 9.1   ------------------------------------------------------------------------------------------------------------------  Cardiac Enzymes  Recent Labs Lab 08/10/16 1634  TROPONINI <0.03   ------------------------------------------------------------------------------------------------------------------  RADIOLOGY:  Dg Chest 2 View  Result Date: 08/10/2016 CLINICAL DATA:  LEFT leg edema and pain today, shortness of breath fall all of the time,  history hypertension, diabetes mellitus, COPD, chronic kidney disease, renal cancer EXAM: CHEST  2 VIEW COMPARISON:  03/29/2015 FINDINGS: Enlargement of cardiac silhouette. Atherosclerotic calcification aorta. Mediastinal contours and pulmonary vascularity normal. Bibasilar atelectasis and small LEFT pleural effusion, chronic. Lungs otherwise clear. No pneumothorax. Bones demineralized with RIGHT glenohumeral degenerative changes. IMPRESSION: Enlargement of cardiac silhouette. Bibasilar atelectasis and effusions little changed from prior study. Aortic atherosclerosis. Electronically Signed   By: Lavonia Dana M.D.   On: 08/10/2016 19:38   Nm Pulmonary Perf And Vent  Result Date: 08/10/2016 CLINICAL DATA:  Shortness of breath, DVT, history of renal cell carcinoma post LEFT nephrectomy, diabetes mellitus, hypertension, polymyalgia rheumatica, stage III chronic kidney disease, former smoker EXAM: NUCLEAR MEDICINE VENTILATION - PERFUSION LUNG SCAN TECHNIQUE: Ventilation images were obtained in multiple projections using inhaled aerosol Tc-27m DTPA. Perfusion images were obtained in multiple projections after intravenous injection of Tc-24m MAA. RADIOPHARMACEUTICALS:  32.177 mCi Technetium-32m DTPA aerosol inhalation and 3.802 mCi Technetium-53m MAA IV COMPARISON:  None Correlation: Chest radiograph 08/10/2016 FINDINGS: Ventilation: Central airway deposition of tracer. Small amount swallowed tracer within the stomach. Diminished ventilation throughout LEFT lower lobe and minimally at posterior RIGHT lower lobe. Additional diminished ventilation at base of RIGHT middle lobe. Perfusion: Matching diminished perfusion in LEFT lower lobe. Additional large subsegmental perfusion defect in RIGHT upper lobe laterally with normal ventilation at the site. Additional small matching perfusion defect at base of RIGHT middle lobe. Chest radiograph demonstrates enlargement of cardiac silhouette, bibasilar atelectasis and small LEFT  pleural effusion. IMPRESSION: Large matching ventilation, perfusion and radiographic abnormalities in the LEFT lower lobe. Additional large subsegmental perfusion defect with normal ventilation in RIGHT upper lobe. Findings represent an intermediate probability for pulmonary embolism. Electronically Signed   By: Lavonia Dana M.D.   On: 08/10/2016 23:47   US Venous Img Lower Unilateral Left  Result Date: 08/10/2016 CLINICAL DATA:  Left lower extremity swelling 1 day. EXAM: Left LOWER EXTREMITY VENOUS DOPPLER ULTRASOUND TECHNIQUE: Gray-scale sonography with graded compression, as well as color Doppler and duplex ultrasound were performed to evaluate the lower extremity deep venous systems from the level of the common femoral vein and including the common femoral, femoral, profunda femoral, popliteal and calf veins including the posterior tibial, peroneal and gastrocnemius veins when visible. The superficial great saphenous vein was also interrogated. Spectral Doppler was utilized to evaluate flow at rest  and with distal augmentation maneuvers in the common femoral, femoral and popliteal veins. COMPARISON:  10/27/2015 FINDINGS: Contralateral Common Femoral Vein: Respiratory phasicity is normal and symmetric with the symptomatic side. No evidence of thrombus. Normal compressibility. Common Femoral Vein: Noncompressible occlusive thrombus present with loss of augmentation. Saphenofemoral Junction: Noncompressible occlusive thrombus. Profunda Femoral Vein: Noncompressible occlusive thrombus with loss of augmentation. Femoral Vein: Noncompressible occlusive thrombus present over the proximal mid segment with loss augmentation. Distal segment difficult to visualize. Popliteal Vein: Noncompressible occlusive thrombus with loss augmentation. Calf Veins: No evidence of thrombus. Normal compressibility and flow on color Doppler imaging. Superficial Great Saphenous Vein: No evidence of thrombus. Normal compressibility and  flow on color Doppler imaging. Venous Reflux:  None. Other Findings:  None. IMPRESSION: Evidence of acute nonocclusive thrombus from the common femoral vein to the popliteal vein with extension to the greater saphenous and profunda femoral veins. Electronically Signed   By: Marin Olp M.D.   On: 08/10/2016 18:08    EKG:   Orders placed or performed during the hospital encounter of 03/29/15  . EKG 12-Lead  . EKG 12-Lead  . EKG    IMPRESSION AND PLAN:  Principal Problem:   PE (pulmonary embolism) - patient was started on anticoagulation in the ED with Lovenox. Given that she has only one kidney and her GFR is borderline, we will switch this to heparin drip for now until she can be started on oral anticoagulant Active Problems:   DVT (deep venous thrombosis) (HCC) - anticoagulation as above.   Hypertension - currently stable, continue home meds   Diabetes ([place]) - sliding scale insulin with corresponding glucose checks and heart healthy/carb modified diet   Hyperlipidemia - home dose statin   Depression - home dose antidepressant   Restless leg syndrome - home dose gabapentin   Chronic kidney disease - avoid nephrotoxins, monitor closely, patient is around her baseline renal function  All the records are reviewed and case discussed with ED provider. Management plans discussed with the patient and/or family.  DVT PROPHYLAXIS: Systemic anticoagulation  GI PROPHYLAXIS: None  ADMISSION STATUS: Inpatient  CODE STATUS: Full Code Status History    Date Active Date Inactive Code Status Order ID Comments User Context   03/29/2015  9:15 PM 03/30/2015  3:55 PM Full Code [id number]  Nicholes Mango, MD Inpatient      TOTAL TIME TAKING CARE OF THIS PATIENT: 45 minutes.    Norvil Martensen FIELDING 08/11/2016, 2:02 AM  [facility]  Office  [phone]  CC: Primary care physician; Tracy Kayser, MD

## 2016-08-11 NOTE — Progress Notes (Addendum)
[facility] [place] [name]    MR#:  [id number]  DATE OF BIRTH:  [birth year]  SUBJECTIVE:   Came in with left LE swelling for few days. Sob+ REVIEW OF SYSTEMS:   Review of Systems  Cardiovascular: Positive for leg swelling.  Musculoskeletal: Positive for joint pain.  Skin: Positive for rash.   Tolerating Diet:yes Tolerating PT: pending  DRUG ALLERGIES:   Allergies  Allergen Reactions  . Fosamax [Alendronate Sodium] Other (See Comments)    Reaction:  Made pt "very sick"  . Wellbutrin [Bupropion] Other (See Comments)    Reaction:  Unknown    VITALS:  Blood pressure (!) 124/46, pulse 77, temperature 99.4 F (37.4 C), temperature source Oral, resp. rate 20, height 5' (1.524 m), weight 88 kg (194 lb), SpO2 99 %.  PHYSICAL EXAMINATION:   Physical Exam  GENERAL:  82 y.o.-year-old patient lying in the bed with no acute distress. Chronically ill EYES: Pupils equal, round, reactive to light and accommodation. No scleral icterus. Extraocular muscles intact.  HEENT: Head atraumatic, normocephalic. Oropharynx and nasopharynx clear.  NECK:  Supple, no jugular venous distention. No thyroid enlargement, no tenderness.  LUNGS:coarse breath sounds bilaterally, no wheezing, rales, rhonchi. No use of accessory muscles of respiration.  CARDIOVASCULAR: S1, S2 normal. No murmurs, rubs, or gallops.  ABDOMEN: Soft, nontender, nondistended. Bowel sounds present. No organomegaly or mass.  EXTREMITIES: No cyanosis, clubbing  -left LE swelling with cellulitis upto left knee NEUROLOGIC: Cranial nerves II through XII are intact. No focal Motor or sensory deficits b/l.   PSYCHIATRIC:  patient is alert and oriented x 3.  SKIN: No obvious rash, lesion, or ulcer.   LABORATORY PANEL:  CBC  Recent Labs Lab 08/11/16 0439  WBC 11.2*  HGB 10.6*  HCT 32.6*  PLT 171    Chemistries   Recent Labs Lab 08/11/16 0439  NA 140  K 4.1  CL  101  CO2 32  GLUCOSE 231*  BUN 31*  CREATININE 1.56*  CALCIUM 8.7*   Cardiac Enzymes  Recent Labs Lab 08/10/16 1634  TROPONINI <0.03   RADIOLOGY:  Dg Chest 2 View  Result Date: 08/10/2016 CLINICAL DATA:  LEFT leg edema and pain today, shortness of breath fall all of the time, history hypertension, diabetes mellitus, COPD, chronic kidney disease, renal cancer EXAM: CHEST  2 VIEW COMPARISON:  03/29/2015 FINDINGS: Enlargement of cardiac silhouette. Atherosclerotic calcification aorta. Mediastinal contours and pulmonary vascularity normal. Bibasilar atelectasis and small LEFT pleural effusion, chronic. Lungs otherwise clear. No pneumothorax. Bones demineralized with RIGHT glenohumeral degenerative changes. IMPRESSION: Enlargement of cardiac silhouette. Bibasilar atelectasis and effusions little changed from prior study. Aortic atherosclerosis. Electronically Signed   By: Lavonia Dana M.D.   On: 08/10/2016 19:38   Nm Pulmonary Perf And Vent  Result Date: 08/10/2016 CLINICAL DATA:  Shortness of breath, DVT, history of renal cell carcinoma post LEFT nephrectomy, diabetes mellitus, hypertension, polymyalgia rheumatica, stage III chronic kidney disease, former smoker EXAM: NUCLEAR MEDICINE VENTILATION - PERFUSION LUNG SCAN TECHNIQUE: Ventilation images were obtained in multiple projections using inhaled aerosol Tc-8m DTPA. Perfusion images were obtained in multiple projections after intravenous injection of Tc-54m MAA. RADIOPHARMACEUTICALS:  32.177 mCi Technetium-58m DTPA aerosol inhalation and 3.802 mCi Technetium-85m MAA IV COMPARISON:  None Correlation: Chest radiograph 08/10/2016 FINDINGS: Ventilation: Central airway deposition of tracer. Small amount swallowed tracer within the stomach. Diminished ventilation throughout LEFT lower lobe and minimally at posterior RIGHT lower lobe. Additional diminished ventilation at  base of RIGHT middle lobe. Perfusion: Matching diminished perfusion in LEFT lower  lobe. Additional large subsegmental perfusion defect in RIGHT upper lobe laterally with normal ventilation at the site. Additional small matching perfusion defect at base of RIGHT middle lobe. Chest radiograph demonstrates enlargement of cardiac silhouette, bibasilar atelectasis and small LEFT pleural effusion. IMPRESSION: Large matching ventilation, perfusion and radiographic abnormalities in the LEFT lower lobe. Additional large subsegmental perfusion defect with normal ventilation in RIGHT upper lobe. Findings represent an intermediate probability for pulmonary embolism. Electronically Signed   By: Lavonia Dana M.D.   On: 08/10/2016 23:47   US Venous Img Lower Unilateral Left  Result Date: 08/10/2016 CLINICAL DATA:  Left lower extremity swelling 1 day. EXAM: Left LOWER EXTREMITY VENOUS DOPPLER ULTRASOUND TECHNIQUE: Gray-scale sonography with graded compression, as well as color Doppler and duplex ultrasound were performed to evaluate the lower extremity deep venous systems from the level of the common femoral vein and including the common femoral, femoral, profunda femoral, popliteal and calf veins including the posterior tibial, peroneal and gastrocnemius veins when visible. The superficial great saphenous vein was also interrogated. Spectral Doppler was utilized to evaluate flow at rest and with distal augmentation maneuvers in the common femoral, femoral and popliteal veins. COMPARISON:  10/27/2015 FINDINGS: Contralateral Common Femoral Vein: Respiratory phasicity is normal and symmetric with the symptomatic side. No evidence of thrombus. Normal compressibility. Common Femoral Vein: Noncompressible occlusive thrombus present with loss of augmentation. Saphenofemoral Junction: Noncompressible occlusive thrombus. Profunda Femoral Vein: Noncompressible occlusive thrombus with loss of augmentation. Femoral Vein: Noncompressible occlusive thrombus present over the proximal mid segment with loss augmentation.  Distal segment difficult to visualize. Popliteal Vein: Noncompressible occlusive thrombus with loss augmentation. Calf Veins: No evidence of thrombus. Normal compressibility and flow on color Doppler imaging. Superficial Great Saphenous Vein: No evidence of thrombus. Normal compressibility and flow on color Doppler imaging. Venous Reflux:  None. Other Findings:  None. IMPRESSION: Evidence of acute nonocclusive thrombus from the common femoral vein to the popliteal vein with extension to the greater saphenous and profunda femoral veins. Electronically Signed   By: Marin Olp M.D.   On: 08/10/2016 18:08   ASSESSMENT AND PLAN:  Tracy Mcmahon  is a 82 y.o. female who presents with Lower extremity pain and swelling as well as some mildly increased shortness of breath for the last 2 days. She denies any fever, chills, or other symptoms of infection. She has COPD and wears 2 L of oxygen at home at baseline, and has not had to increase this, but does report some mildly increased shortness of breath with exertion  1.acute bilateral PE (pulmonary embolism) -patient was started on anticoagulation in the ED with Lovenox.  -Given that she has only one kidney and her GFR is borderline, we will switch this to heparin drip for now and can be started on oral anticoagulant-warfarin (she has taken it in the past for DVT) -her anticoagulation will wilifelong due to recurrence of DVT  *left LE acute DVT (deep venous thrombosis) (HCC) - anticoagulation as above.  *left LE cellulitis -IV cefazolin  * Hypertension - currently stable, continue home meds  *  Diabetes (HCC) - sliding scale insulin with corresponding glucose checks and heart healthy/carb modified diet  *  Hyperlipidemia - home dose statin  *  Depression - home dose antidepressant  *  Restless leg syndrome - home dose gabapentin   * Chronic kidney disease - avoid nephrotoxins, monitor closely, patient is around her baseline renal function -pt  is s/p  nephrectomy in the past due to renal cancer  PT to see pt-per dter pt does not ambulate much in the house. [facility] to [facility]  Case discussed with Care Management/Social Worker. Management plans discussed with the patient, family and they are in agreement.  CODE STATUS: FULL  DVT Prophylaxis: heparin TOTAL TIME TAKING CARE OF THIS PATIENT: 75minutes.  >50% time spent on counselling and coordination of care dter,pt and RN  POSSIBLE D/C IN 1-2DAYS, DEPENDING ON CLINICAL CONDITION.  Note: This dictation was prepared with Dragon dictation along with smaller phrase technology. Any transcriptional errors that result from this process are unintentional.  Daleysa Kristiansen M.D on 08/11/2016 at 12:36 PM  Between 7am to 6pm - Pager - [phone]  After 6pm go to [email or web address] - password [email or web address] [facility]  Office  [phone]  CC: Primary care physician; [name], MD

## 2016-08-11 NOTE — Progress Notes (Signed)
[facility] for warfarin dosing Indication: pulmonary embolus  Allergies  Allergen Reactions  . Fosamax [Alendronate Sodium] Other (See Comments)    Reaction:  Made pt "very sick"  . Wellbutrin [Bupropion] Other (See Comments)    Reaction:  Unknown    Patient Measurements: Height: 5' (152.4 cm) Weight: 194 lb (88 kg) IBW/kg (Calculated) : 45.5 Heparin Dosing Weight: 66.2  Vital Signs: Temp: 99.4 F (37.4 C) (09/17 1139) Temp Source: Oral (09/17 1139) BP: 124/46 (09/17 1139) Pulse Rate: 77 (09/17 1139)  Labs:  Recent Labs  08/10/16 1634 08/11/16 0439  HGB 11.4* 10.6*  HCT 34.6* 32.6*  PLT 190 171  APTT  --  40*  LABPROT  --  14.3  INR  --  1.11  HEPARINUNFRC  --  0.89*  CREATININE 1.57* 1.56*  TROPONINI <0.03  --     Estimated Creatinine Clearance: 26 mL/min (by C-G formula based on SCr of 1.56 mg/dL (H)).   Medications:  Infusions:  . heparin      Assessment: Pharmacy consulted to dose and monitor warfarin in this 82 year old female diagnosed with a PE. Patient received one treatment dose of enoxaparin 90 mg subcutaneously on 9/16 @ 1819 in the ED. No anticoagulants prior to admission. Per MD, pt now needs lifetime anticoagulation.   Goal of Therapy:  INR 2-3 Monitor platelets by anticoagulation protocol: Yes   Plan:  Pt is to begin bridging this evening with heparin drip and warfarin 2.5 mg @ 1800. CBC and INR have been ordered with am labs.  Date   Dose (mg) AM INR 9/17  2.5  1.1  Darrow Bussing, PharmD Pharmacy Resident 08/11/2016 2:10 PM

## 2016-08-11 NOTE — Progress Notes (Signed)
Pharmacy Antibiotic Note  Tracy Mcmahon is a 82 y.o. female admitted on 08/10/2016 with PE & cellulitis.  Pharmacy has been consulted for cefazolin dosing.  Plan: Cefazolin 1 g IV q12h  Height: 5' (152.4 cm) Weight: 194 lb (88 kg) IBW/kg (Calculated) : 45.5  Temp (24hrs), Avg:98.7 F (37.1 C), Min:98.2 F (36.8 C), Max:99.4 F (37.4 C)   Recent Labs Lab 08/10/16 1634 08/11/16 0439  WBC 12.7* 11.2*  CREATININE 1.57* 1.56*    Estimated Creatinine Clearance: 26 mL/min (by C-G formula based on SCr of 1.56 mg/dL (H)).    Allergies  Allergen Reactions  . Fosamax [Alendronate Sodium] Other (See Comments)    Reaction:  Made pt "very sick"  . Wellbutrin [Bupropion] Other (See Comments)    Reaction:  Unknown    Antimicrobials this admission: cefazolin 9/17 >>   Dose adjustments this admission:  Microbiology results: None  Thank you for allowing pharmacy to be a part of this patient's care.  Lenis Noon, PharmD, BCPS Clinical Pharmacist 08/11/2016 2:05 PM

## 2016-08-11 NOTE — ED Notes (Signed)
Pt updated on treatment plan. Pt and family verbalize understanding. Po fluids and meal tray provided.

## 2016-08-11 NOTE — Progress Notes (Signed)
ANTICOAGULATION CONSULT NOTE - Initial Consult  Pharmacy Consult for heparin Indication: pulmonary embolus  Allergies  Allergen Reactions  . Fosamax [Alendronate Sodium] Other (See Comments)    Reaction:  Made pt "very sick"  . Wellbutrin [Bupropion] Other (See Comments)    Reaction:  Unknown    Patient Measurements: Height: 5' (152.4 cm) Weight: 200 lb (90.7 kg) IBW/kg (Calculated) : 45.5 Heparin Dosing Weight: 67 kg  Vital Signs: Temp: 98.2 F (36.8 C) (09/16 1623) Temp Source: Oral (09/16 1623) BP: 117/38 (09/17 0300) Pulse Rate: 68 (09/17 0300)  Labs:  Recent Labs  08/10/16 1634  HGB 11.4*  HCT 34.6*  PLT 190  CREATININE 1.57*  TROPONINI <0.03    Estimated Creatinine Clearance: 26.3 mL/min (by C-G formula based on SCr of 1.57 mg/dL (H)).   Medical History: Past Medical History:  Diagnosis Date  . Cancer ([place])   . Cataract   . Chronic kidney disease (CKD), stage III (moderate)   . Chronic urticaria   . COPD (chronic obstructive pulmonary disease) ([place])   . Depression   . Diabetes mellitus without complication ([place])   . DVT (deep venous thrombosis) (H[facility])    left leg  . Hyperlipemia   . Hypertension   . Osteoarthritis   . Osteopenia   . Polymyalgia rheumatica ([place])   . Polyneuropathy ([place])   . Renal cancer ([place])   . Renal insufficiency 08/10/2016   Chart indicates CKD stage 3  . Restless leg syndrome   . Vitamin D deficiency   . Vulvar intraepithelial neoplasia with lichen sclerosus     Medications:  Infusions:  . heparin      Assessment: 87 yof cc leg swelling and SOB x 2 days. DVT in LLE present, VQ showed intermediate risk for PE - contrast CI due to single kidney. Pharmacy consulted to dose UFH for DVT and PE. Patient received enoxaparin 90 mg subcutaneous x 1 08/10/16 at 1819 in ED. CrCl <30 mL/min.  Goal of Therapy:  Heparin level 0.3-0.7 units/ml Monitor platelets by anticoagulation protocol: Yes   Plan: LMWH will be effective  anticoagulation for approximately 24 hours based in creatinine clearance. Check APTT, PT/INR, HL now. Heparin infusion planned to start 08/11/16 at 1200. Pharmacy will continue to follow and adjust as needed.  Start heparin infusion at 1100 units/hr Check anti-Xa level in 8 hours and daily while on heparin Continue to monitor H&H and platelets  Laural Benes, Pharm.D., BCPS Clinical Pharmacist 08/11/2016,3:27 AM

## 2016-08-11 NOTE — ED Provider Notes (Signed)
-----------------------------------------   1:55 AM on 08/11/2016 -----------------------------------------   Blood pressure (!) 123/54, pulse 70, temperature 98.2 F (36.8 C), temperature source Oral, resp. rate (!) 23, height 5' (1.524 m), weight 200 lb (90.7 kg), SpO2 99 %.  Assuming care from Dr. Kelli Hope.  In short, Tracy Mcmahon is a 82 y.o. female with a chief complaint of Leg Swelling .  Refer to the original H&P for additional details.  The current plan of care is to follow up the results of the V/Q scan.  VQ scan:  Large matching ventilation, perfusion and radiographic abnormalities  in the LEFT lower lobe.    Additional large subsegmental perfusion defect with normal  ventilation in RIGHT upper lobe.    Findings represent an intermediate probability for pulmonary  embolism.   Given the patient's V/Q mismatch and intermediate probability for PE I will admit the patient to the hospitalist service. I did contact the hospitalist. The patient received a dose of Lovenox prior. The patient has no further questions. I will give the patient some medicine for pain.    Loney Hering, MD 08/11/16 0157

## 2016-08-12 LAB — CREATININE, SERUM
CREATININE: 1.24 mg/dL — AB (ref 0.44–1.00)
GFR, EST AFRICAN AMERICAN: 45 mL/min — AB (ref >60)
GFR, EST NON AFRICAN AMERICAN: 38 mL/min — AB (ref >60)

## 2016-08-12 LAB — GLUCOSE, CAPILLARY
GLUCOSE-CAPILLARY: 151 mg/dL — AB (ref 65–99)
GLUCOSE-CAPILLARY: 242 mg/dL — AB (ref 65–99)
GLUCOSE-CAPILLARY: 245 mg/dL — AB (ref 65–99)
Glucose-Capillary: 259 mg/dL — ABNORMAL HIGH (ref 65–99)

## 2016-08-12 LAB — HEMOGLOBIN A1C
Hgb A1c MFr Bld: 7.6 % — ABNORMAL HIGH (ref 4.8–5.6)
MEAN PLASMA GLUCOSE: 171 mg/dL

## 2016-08-12 LAB — PROTIME-INR
INR: 1.07
Prothrombin Time: 13.9 seconds (ref 11.4–15.2)

## 2016-08-12 LAB — CBC
HCT: 31 % — ABNORMAL LOW (ref 35.0–47.0)
HEMOGLOBIN: 10.2 g/dL — AB (ref 12.0–16.0)
MCH: 29.6 pg (ref 26.0–34.0)
MCHC: 32.9 g/dL (ref 32.0–36.0)
MCV: 90 fL (ref 80.0–100.0)
Platelets: 169 10*3/uL (ref 150–440)
RBC: 3.45 MIL/uL — AB (ref 3.80–5.20)
RDW: 16.6 % — ABNORMAL HIGH (ref 11.5–14.5)
WBC: 12 10*3/uL — ABNORMAL HIGH (ref 3.6–11.0)

## 2016-08-12 LAB — HEPARIN LEVEL (UNFRACTIONATED)
Heparin Unfractionated: 0.5 IU/mL (ref 0.30–0.70)
Heparin Unfractionated: 0.46 IU/mL (ref 0.30–0.70)

## 2016-08-12 MED ORDER — APIXABAN 5 MG PO TABS
10.0000 mg | ORAL_TABLET | Freq: Two times a day (BID) | ORAL | Status: DC
  Administered 2016-08-12 – 2016-08-13 (×2): 10 mg via ORAL
  Filled 2016-08-12 (×2): qty 2

## 2016-08-12 MED ORDER — INSULIN GLARGINE 100 UNIT/ML ~~LOC~~ SOLN
80.0000 [IU] | Freq: Every day | SUBCUTANEOUS | Status: DC
Start: 2016-08-12 — End: 2016-08-13
  Administered 2016-08-12: 80 [IU] via SUBCUTANEOUS
  Filled 2016-08-12 (×3): qty 0.8

## 2016-08-12 MED ORDER — APIXABAN 5 MG PO TABS: 5.0000 mg | ORAL_TABLET | Freq: Two times a day (BID) | ORAL | Status: DC

## 2016-08-12 NOTE — Care Management Note (Addendum)
Case Management Note  Patient Details  [name] MRN: [id number] Date of Birth: 1929/01/29  Subjective/Objective:   Met with daughter, [name] [phone], to discuss discharge planning. Patient lives at home with her daughter and son in law. Someone is with patient 24/7 unless they have to run out to the store. She uses a walker but spends most of her time in her lift chair. Per the dgt, she is unmotivated to work with PT. PT consult pending.  Admitted with acute bilateral PE, LE DVT and LE cellulitis. Daughter does not feel patient will work with PT but is willing to have a nurse come out. Discussed agency preference. Will refer to [facility] for [facility].  She has currently been placed on coumadin which may be switched to Eliquis. Will monitor chart. PCP is Dr. Dorthula Perfect.              Action/Plan: Referral to [facility] for SN.   Expected Discharge Date:                  Expected Discharge Plan:  [place]  In-House Referral:     Discharge planning Services  CM Consult  Post Acute Care Choice:  Home Health Choice offered to:  Adult Children  DME Arranged:    DME Agency:     HH Arranged:  RN [place] Agency:  [place]  Status of Service:  In process, will continue to follow  If discussed at Long Length of Stay Meetings, dates discussed:    Additional Comments:  Jolly Mango, RN 08/12/2016, 11:34 AM

## 2016-08-12 NOTE — Progress Notes (Signed)
Pharmacy Antibiotic Note  Tracy Mcmahon is a 82 y.o. female admitted on 08/10/2016 with PE & cellulitis.  Pharmacy has been consulted for cefazolin dosing.  Abx Day #2   Plan: Cefazolin 1 g IV q12h  Height: 5' (152.4 cm) Weight: 194 lb (88 kg) IBW/kg (Calculated) : 45.5  Temp (24hrs), Avg:98.7 F (37.1 C), Min:98.3 F (36.8 C), Max:99.4 F (37.4 C)   Recent Labs Lab 08/10/16 1634 08/11/16 0439 08/12/16 0229  WBC 12.7* 11.2* 12.0*  CREATININE 1.57* 1.56*  --     Estimated Creatinine Clearance: 26 mL/min (by C-G formula based on SCr of 1.56 mg/dL (H)).    Allergies  Allergen Reactions  . Fosamax [Alendronate Sodium] Other (See Comments)    Reaction:  Made pt "very sick"  . Wellbutrin [Bupropion] Other (See Comments)    Reaction:  Unknown    Antimicrobials this admission: cefazolin 9/17 >>   Dose adjustments this admission: Cefazolin Q12hrs  Microbiology results: None  Thank you for allowing pharmacy to be a part of this patient's care.  Loree Fee, PharmD 08/12/2016 7:39 AM

## 2016-08-12 NOTE — Progress Notes (Signed)
[place] [facility] [place] [name]    MR#:  [id number]  DATE OF BIRTH:  [birth year]  SUBJECTIVE:   Came in with left LE swelling for few days. Sob+ improved  REVIEW OF SYSTEMS:   Review of Systems  Constitutional: Negative for chills, fever and weight loss.  HENT: Negative for ear discharge, ear pain and nosebleeds.   Eyes: Negative for blurred vision, pain and discharge.  Respiratory: Negative for sputum production, shortness of breath, wheezing and stridor.   Cardiovascular: Positive for leg swelling. Negative for chest pain, palpitations, orthopnea and PND.  Gastrointestinal: Negative for abdominal pain, diarrhea, nausea and vomiting.  Genitourinary: Negative for frequency and urgency.  Musculoskeletal: Positive for joint pain. Negative for back pain.  Skin: Positive for rash.  Neurological: Negative for sensory change, speech change, focal weakness and weakness.  Psychiatric/Behavioral: Negative for depression and hallucinations. The patient is not nervous/anxious.    Tolerating Diet:yes Tolerating PT: pending  DRUG ALLERGIES:   Allergies  Allergen Reactions  . Fosamax [Alendronate Sodium] Other (See Comments)    Reaction:  Made pt "very sick"  . Wellbutrin [Bupropion] Other (See Comments)    Reaction:  Unknown    VITALS:  Blood pressure (!) 131/44, pulse 64, temperature 98.7 F (37.1 C), temperature source Oral, resp. rate 16, height 5' (1.524 m), weight 88 kg (194 lb), SpO2 100 %.  PHYSICAL EXAMINATION:   Physical Exam  GENERAL:  82 y.o.-year-old patient lying in the bed with no acute distress. Chronically ill EYES: Pupils equal, round, reactive to light and accommodation. No scleral icterus. Extraocular muscles intact.  HEENT: Head atraumatic, normocephalic. Oropharynx and nasopharynx clear.  NECK:  Supple, no jugular venous distention. No thyroid enlargement, no tenderness.  LUNGS:coarse breath sounds  bilaterally, no wheezing, rales, rhonchi. No use of accessory muscles of respiration.  CARDIOVASCULAR: S1, S2 normal. No murmurs, rubs, or gallops.  ABDOMEN: Soft, nontender, nondistended. Bowel sounds present. No organomegaly or mass.  EXTREMITIES: No cyanosis, clubbing  -left LE swelling with cellulitis upto left knee-mild improvement NEUROLOGIC: Cranial nerves II through XII are intact. No focal Motor or sensory deficits b/l.   PSYCHIATRIC:  patient is alert and oriented x 3.  SKIN: No obvious rash, lesion, or ulcer.   LABORATORY PANEL:  CBC  Recent Labs Lab 08/12/16 0229  WBC 12.0*  HGB 10.2*  HCT 31.0*  PLT 169    Chemistries   Recent Labs Lab 08/11/16 0439 08/12/16 1057  NA 140  --   K 4.1  --   CL 101  --   CO2 32  --   GLUCOSE 231*  --   BUN 31*  --   CREATININE 1.56* 1.24*  CALCIUM 8.7*  --    Cardiac Enzymes  Recent Labs Lab 08/10/16 1634  TROPONINI <0.03   RADIOLOGY:  Dg Chest 2 View  Result Date: 08/10/2016 CLINICAL DATA:  LEFT leg edema and pain today, shortness of breath fall all of the time, history hypertension, diabetes mellitus, COPD, chronic kidney disease, renal cancer EXAM: CHEST  2 VIEW COMPARISON:  03/29/2015 FINDINGS: Enlargement of cardiac silhouette. Atherosclerotic calcification aorta. Mediastinal contours and pulmonary vascularity normal. Bibasilar atelectasis and small LEFT pleural effusion, chronic. Lungs otherwise clear. No pneumothorax. Bones demineralized with RIGHT glenohumeral degenerative changes. IMPRESSION: Enlargement of cardiac silhouette. Bibasilar atelectasis and effusions little changed from prior study. Aortic atherosclerosis. Electronically Signed   By: Lavonia Dana M.D.   On: 08/10/2016 19:38  Nm Pulmonary Perf And Vent  Result Date: 08/10/2016 CLINICAL DATA:  Shortness of breath, DVT, history of renal cell carcinoma post LEFT nephrectomy, diabetes mellitus, hypertension, polymyalgia rheumatica, stage III chronic kidney  disease, former smoker EXAM: NUCLEAR MEDICINE VENTILATION - PERFUSION LUNG SCAN TECHNIQUE: Ventilation images were obtained in multiple projections using inhaled aerosol Tc-62m DTPA. Perfusion images were obtained in multiple projections after intravenous injection of Tc-16m MAA. RADIOPHARMACEUTICALS:  32.177 mCi Technetium-1m DTPA aerosol inhalation and 3.802 mCi Technetium-42m MAA IV COMPARISON:  None Correlation: Chest radiograph 08/10/2016 FINDINGS: Ventilation: Central airway deposition of tracer. Small amount swallowed tracer within the stomach. Diminished ventilation throughout LEFT lower lobe and minimally at posterior RIGHT lower lobe. Additional diminished ventilation at base of RIGHT middle lobe. Perfusion: Matching diminished perfusion in LEFT lower lobe. Additional large subsegmental perfusion defect in RIGHT upper lobe laterally with normal ventilation at the site. Additional small matching perfusion defect at base of RIGHT middle lobe. Chest radiograph demonstrates enlargement of cardiac silhouette, bibasilar atelectasis and small LEFT pleural effusion. IMPRESSION: Large matching ventilation, perfusion and radiographic abnormalities in the LEFT lower lobe. Additional large subsegmental perfusion defect with normal ventilation in RIGHT upper lobe. Findings represent an intermediate probability for pulmonary embolism. Electronically Signed   By: Lavonia Dana M.D.   On: 08/10/2016 23:47   US Venous Img Lower Unilateral Left  Result Date: 08/10/2016 CLINICAL DATA:  Left lower extremity swelling 1 day. EXAM: Left LOWER EXTREMITY VENOUS DOPPLER ULTRASOUND TECHNIQUE: Gray-scale sonography with graded compression, as well as color Doppler and duplex ultrasound were performed to evaluate the lower extremity deep venous systems from the level of the common femoral vein and including the common femoral, femoral, profunda femoral, popliteal and calf veins including the posterior tibial, peroneal and  gastrocnemius veins when visible. The superficial great saphenous vein was also interrogated. Spectral Doppler was utilized to evaluate flow at rest and with distal augmentation maneuvers in the common femoral, femoral and popliteal veins. COMPARISON:  10/27/2015 FINDINGS: Contralateral Common Femoral Vein: Respiratory phasicity is normal and symmetric with the symptomatic side. No evidence of thrombus. Normal compressibility. Common Femoral Vein: Noncompressible occlusive thrombus present with loss of augmentation. Saphenofemoral Junction: Noncompressible occlusive thrombus. Profunda Femoral Vein: Noncompressible occlusive thrombus with loss of augmentation. Femoral Vein: Noncompressible occlusive thrombus present over the proximal mid segment with loss augmentation. Distal segment difficult to visualize. Popliteal Vein: Noncompressible occlusive thrombus with loss augmentation. Calf Veins: No evidence of thrombus. Normal compressibility and flow on color Doppler imaging. Superficial Great Saphenous Vein: No evidence of thrombus. Normal compressibility and flow on color Doppler imaging. Venous Reflux:  None. Other Findings:  None. IMPRESSION: Evidence of acute nonocclusive thrombus from the common femoral vein to the popliteal vein with extension to the greater saphenous and profunda femoral veins. Electronically Signed   By: Marin Olp M.D.   On: 08/10/2016 18:08   ASSESSMENT AND PLAN:  Tracy Mcmahon  is a 82 y.o. female who presents with Lower extremity pain and swelling as well as some mildly increased shortness of breath for the last 2 days. She denies any fever, chills, or other symptoms of infection. She has COPD and wears 2 L of oxygen at home at baseline, and has not had to increase this, but does report some mildly increased shortness of breath with exertion  1.acute bilateral PE (pulmonary embolism) -patient was started on anticoagulation in the ED with Lovenox.  -Given that she has only one  kidney and her GFR is borderline, we will  switch this to heparin drip for now and can be started on oral anticoagulant-warfarin (she has taken it in the past for DVT) -her anticoagulation will wilifelong due to recurrence of DVT -pt's creatinine/GFR has improved and so will place her on eliquis for rx purpose. This was d/w pharmacy  *left LE acute DVT (deep venous thrombosis) (HCC) - anticoagulation as above.  *left LE cellulitis -IV cefazolin  * Hypertension - currently stable, continue home meds  *  Diabetes (HCC) - sliding scale insulin with corresponding glucose checks and heart healthy/carb modified diet  *  Hyperlipidemia - home dose statin  *  Depression - home dose antidepressant  *  Restless leg syndrome - home dose gabapentin   * Chronic kidney disease - avoid nephrotoxins, monitor closely, patient is around her baseline renal function -pt is s/p nephrectomy in the past due to renal cancer  PT to see pt-per dter pt does not ambulate much in the house. She goes to the [facility] only D/c in am if continues to improve  Case discussed with Care Management/Social Worker. Management plans discussed with the patient, family and they are in agreement.  CODE STATUS: FULL  DVT Prophylaxis: heparin TOTAL TIME TAKING CARE OF THIS PATIENT: 2minutes.  >50% time spent on counselling and coordination of care dter,pt and RN  POSSIBLE D/C IN 1-2DAYS, DEPENDING ON CLINICAL CONDITION.  Note: This dictation was prepared with Dragon dictation along with smaller phrase technology. Any transcriptional errors that result from this process are unintentional.  Berdell Nevitt M.D on 08/12/2016 at 5:06 PM  Between 7am to 6pm - Pager - [phone]  After 6pm go to [email or web address] - password [email or web address] [facility]  Office  [phone]  CC: Primary care physician; [name], MD

## 2016-08-12 NOTE — Progress Notes (Signed)
PT Hold Note  Patient Details [name] MRN: [id number] DOB: 1929/04/14   Cancelled Treatment:    Reason Eval/Treat Not Completed: Medical issues which prohibited therapy. PT order received. Chart reviewed. Pt with bilateral PEs. Per protocol PT will be initiated 48 hours after initiation of therapeutic anticoagulants. First therapeutic dose in chart is 08/10/16 at 1819. PT evaluation will be performed when appropriate after 08/12/16 at 1819.   Lyndel Safe Brinsley Wence PT, DPT   Jai Bear 08/12/2016, 10:20 AM

## 2016-08-12 NOTE — Progress Notes (Signed)
Heparin drip continues,iv antibiotics continues,pain management in progress,continues on 2 liters of oxygen via nasal canula,received flu vaccine and well tolerated.

## 2016-08-12 NOTE — Progress Notes (Signed)
ANTICOAGULATION CONSULT NOTE - Initial Consult  Pharmacy Consult for heparin drip Indication: pulmonary embolus  Allergies  Allergen Reactions  . Fosamax [Alendronate Sodium] Other (See Comments)    Reaction:  Made pt "very sick"  . Wellbutrin [Bupropion] Other (See Comments)    Reaction:  Unknown    Patient Measurements: Height: 5' (152.4 cm) Weight: 194 lb (88 kg) IBW/kg (Calculated) : 45.5 Heparin Dosing Weight: 66 kg  Vital Signs: Temp: 98.4 F (36.9 C) (09/17 2332) Temp Source: Oral (09/17 2332) BP: 152/59 (09/17 2332) Pulse Rate: 81 (09/17 2332)  Labs:  Recent Labs  08/10/16 1634 08/11/16 0439 08/12/16 0229  HGB 11.4* 10.6* 10.2*  HCT 34.6* 32.6* 31.0*  PLT 190 171 169  APTT  --  40*  --   LABPROT  --  14.3 13.9  INR  --  1.11 1.07  HEPARINUNFRC  --  0.89* 0.50  CREATININE 1.57* 1.56*  --   TROPONINI <0.03  --   --     Estimated Creatinine Clearance: 26 mL/min (by C-G formula based on SCr of 1.56 mg/dL (H)).   Medical History: Past Medical History:  Diagnosis Date  . Cancer ([place])   . Cataract   . Chronic kidney disease (CKD), stage III (moderate)   . Chronic urticaria   . COPD (chronic obstructive pulmonary disease) ([place])   . Depression   . Diabetes mellitus without complication ([place])   . DVT (deep venous thrombosis) (HCC)    left leg  . Hyperlipemia   . Hypertension   . Osteoarthritis   . Osteopenia   . Polymyalgia rheumatica ([place])   . Polyneuropathy ([place])   . Renal cancer ([place])   . Renal insufficiency 08/10/2016   Chart indicates CKD stage 3  . Restless leg syndrome   . Vitamin D deficiency   . Vulvar intraepithelial neoplasia with lichen sclerosus     Assessment: Pharmacy consulted to dose and monitor heparin drip in this 82 year old female diagnosed with a PE. Patient received one treatment dose of enoxaparin 90 mg subcutaneously on 9/16 @ 1819 in the ED. Patient was not taking anticoagulants prior to admission.  9/18 0230 Heparin  level resulted at 0.5, heparin infusing at 1100 units/hr.  Goal of Therapy:  Heparin level 0.3-0.7 units/ml Monitor platelets by anticoagulation protocol: Yes   Plan:  Will continue Heparin at current rate and check a confirmation level in 8 hours.  Paulina Fusi, PharmD, BCPS 08/12/2016 3:17 AM

## 2016-08-12 NOTE — Progress Notes (Signed)
Inpatient Diabetes Program Recommendations  AACE/ADA: New Consensus Statement on Inpatient Glycemic Control (2015)  Target Ranges:  Prepandial:   less than 140 mg/dL      Peak postprandial:   less than 180 mg/dL (1-2 hours)      Critically ill patients:  140 - 180 mg/dL   Results for Tracy Mcmahon, Tracy Mcmahon (MRN [id number]) as of 08/12/2016 11:25  Ref. Range 08/11/2016 07:12 08/11/2016 11:41 08/11/2016 17:05 08/11/2016 21:19 08/12/2016 08:07  Glucose-Capillary Latest Ref Range: 65 - 99 mg/dL 166 (H) 204 (H) 222 (H) 239 (H) 151 (H)   Review of Glycemic Control  Diabetes history: DM2 Outpatient Diabetes medications: Lantus 80 units daily at 1pm, Novolog 0-20 units BID (if needed based on correction scale) Current orders for Inpatient glycemic control: Novolog 0-9 units TID with meals, Novolog 0-5 units QHS  Inpatient Diabetes Program Recommendations: Insulin - Meal Coverage: Post prandial glucose is consistently elevated. Please consider ordering Novolog 4 units TID with meals for meal coverage.  Thanks, Barnie Alderman, RN, MSN, CDE Diabetes Coordinator Inpatient Diabetes Program [phone] (Team Pager from [place] to [place]) [phone] ([facility] office) [phone] [facility] office) [phone] [facility] office)

## 2016-08-12 NOTE — Progress Notes (Signed)
ANTICOAGULATION CONSULT NOTE - Initial Consult  Pharmacy Consult for heparin drip Indication: pulmonary embolus  Allergies  Allergen Reactions  . Fosamax [Alendronate Sodium] Other (See Comments)    Reaction:  Made pt "very sick"  . Wellbutrin [Bupropion] Other (See Comments)    Reaction:  Unknown    Patient Measurements: Height: 5' (152.4 cm) Weight: 194 lb (88 kg) IBW/kg (Calculated) : 45.5 Heparin Dosing Weight: 66 kg  Vital Signs: Temp: 98.7 F (37.1 C) (09/18 1100) Temp Source: Oral (09/18 1100) BP: 131/44 (09/18 1100) Pulse Rate: 64 (09/18 1100)  Labs:  Recent Labs  08/10/16 1634 08/11/16 0439 08/12/16 0229 08/12/16 1057  HGB 11.4* 10.6* 10.2*  --   HCT 34.6* 32.6* 31.0*  --   PLT 190 171 169  --   APTT  --  40*  --   --   LABPROT  --  14.3 13.9  --   INR  --  1.11 1.07  --   HEPARINUNFRC  --  0.89* 0.50 0.46  CREATININE 1.57* 1.56*  --   --   TROPONINI <0.03  --   --   --     Estimated Creatinine Clearance: 26 mL/min (by C-G formula based on SCr of 1.56 mg/dL (H)).   Medical History: Past Medical History:  Diagnosis Date  . Cancer ([place])   . Cataract   . Chronic kidney disease (CKD), stage III (moderate)   . Chronic urticaria   . COPD (chronic obstructive pulmonary disease) ([place])   . Depression   . Diabetes mellitus without complication ([place])   . DVT (deep venous thrombosis) (HCC)    left leg  . Hyperlipemia   . Hypertension   . Osteoarthritis   . Osteopenia   . Polymyalgia rheumatica ([place])   . Polyneuropathy ([place])   . Renal cancer ([place])   . Renal insufficiency 08/10/2016   Chart indicates CKD stage 3  . Restless leg syndrome   . Vitamin D deficiency   . Vulvar intraepithelial neoplasia with lichen sclerosus     Assessment: Pharmacy consulted to dose and monitor heparin drip in this 82 year old female diagnosed with a PE. Patient received one treatment dose of enoxaparin 90 mg subcutaneously on 9/16 @ 1819 in the ED. Patient was not  taking anticoagulants prior to admission.  9/18 0230 Heparin level resulted at 0.5, heparin infusing at 1100 units/hr. 9/18 1000 HL resulted at 0.46; confirmatory HL  Goal of Therapy:  Heparin level 0.3-0.7 units/ml Monitor platelets by anticoagulation protocol: Yes   Plan:  Will continue Heparin at current rate, as second HL was within range and continue bridging with warfarin 2.5mg  (day 2 of warfarin). Will check HL/CBC with AM labs.   Carmin Richmond, PharmD 08/12/2016 12:14 PM

## 2016-08-12 NOTE — Progress Notes (Signed)
ANTICOAGULATION CONSULT NOTE - Initial Consult  Pharmacy Consult for apixaban Indication: pulmonary embolus  Allergies  Allergen Reactions  . Fosamax [Alendronate Sodium] Other (See Comments)    Reaction:  Made pt "very sick"  . Wellbutrin [Bupropion] Other (See Comments)    Reaction:  Unknown    Patient Measurements: Height: 5' (152.4 cm) Weight: 194 lb (88 kg) IBW/kg (Calculated) : 45.5  Vital Signs: Temp: 98.7 F (37.1 C) (09/18 1100) Temp Source: Oral (09/18 1100) BP: 131/44 (09/18 1100) Pulse Rate: 64 (09/18 1100)  Labs:  Recent Labs  08/10/16 1634 08/11/16 0439 08/12/16 0229 08/12/16 1057  HGB 11.4* 10.6* 10.2*  --   HCT 34.6* 32.6* 31.0*  --   PLT 190 171 169  --   APTT  --  40*  --   --   LABPROT  --  14.3 13.9  --   INR  --  1.11 1.07  --   HEPARINUNFRC  --  0.89* 0.50 0.46  CREATININE 1.57* 1.56*  --  1.24*  TROPONINI <0.03  --   --   --     Estimated Creatinine Clearance: 32.7 mL/min (by C-G formula based on SCr of 1.24 mg/dL (H)).   Medical History: Past Medical History:  Diagnosis Date  . Cancer ([place])   . Cataract   . Chronic kidney disease (CKD), stage III (moderate)   . Chronic urticaria   . COPD (chronic obstructive pulmonary disease) ([place])   . Depression   . Diabetes mellitus without complication ([place])   . DVT (deep venous thrombosis) ([facility])    left leg  . Hyperlipemia   . Hypertension   . Osteoarthritis   . Osteopenia   . Polymyalgia rheumatica ([place])   . Polyneuropathy ([place])   . Renal cancer ([place])   . Renal insufficiency 08/10/2016   Chart indicates CKD stage 3  . Restless leg syndrome   . Vitamin D deficiency   . Vulvar intraepithelial neoplasia with lichen sclerosus     Assessment: Pharmacy consulted to dose apixaban in this 82 year old female diagnosed with PE. Patient has been on warfarin and heparin drip, but only received one dose of warfarin - MD changing therapy to apixaban for treatment of PE today. Patient has only  received one dose of warfarin and INR remains subtherapeutic.  Plan:  Discontinue warfarin orders Schedule heparin drip to stop at 2200 tonight Will begin apixaban at 2200 tonight - apixaban 10 mg PO BID x 7 days followed by apixaban 5 mg PO BID. Will monitor CBC and SCr.  Lenis Noon, PharmD CLinical Pharmacist 08/12/2016,2:40 PM

## 2016-08-13 DIAGNOSIS — Z23 Encounter for immunization: Secondary | ICD-10-CM | POA: Diagnosis not present

## 2016-08-13 LAB — HEPARIN LEVEL (UNFRACTIONATED): Heparin Unfractionated: >3.6 IU/mL — ABNORMAL HIGH (ref 0.30–0.70)

## 2016-08-13 LAB — GLUCOSE, CAPILLARY
GLUCOSE-CAPILLARY: 152 mg/dL — AB (ref 65–99)
GLUCOSE-CAPILLARY: 253 mg/dL — AB (ref 65–99)
GLUCOSE-CAPILLARY: 262 mg/dL — AB (ref 65–99)

## 2016-08-13 LAB — CBC
HEMATOCRIT: 29.6 % — AB (ref 35.0–47.0)
HEMOGLOBIN: 9.8 g/dL — AB (ref 12.0–16.0)
MCH: 29.4 pg (ref 26.0–34.0)
MCHC: 33 g/dL (ref 32.0–36.0)
MCV: 89.1 fL (ref 80.0–100.0)
PLATELETS: 180 10*3/uL (ref 150–440)
RBC: 3.32 MIL/uL — AB (ref 3.80–5.20)
RDW: 16.7 % — ABNORMAL HIGH (ref 11.5–14.5)
WBC: 10.4 10*3/uL (ref 3.6–11.0)

## 2016-08-13 LAB — PROTIME-INR
INR: 1.47
Prothrombin Time: 18 seconds — ABNORMAL HIGH (ref 11.4–15.2)

## 2016-08-13 MED ORDER — HEPARIN (PORCINE) IN NACL 100-0.45 UNIT/ML-% IJ SOLN: 1100.0000 [IU]/h | INTRAMUSCULAR | Status: DC

## 2016-08-13 MED ORDER — CEPHALEXIN 500 MG PO CAPS
500.0000 mg | ORAL_CAPSULE | Freq: Two times a day (BID) | ORAL | Status: DC
  Administered 2016-08-13: 500 mg via ORAL
  Filled 2016-08-13: qty 1

## 2016-08-13 MED ORDER — CEPHALEXIN 500 MG PO CAPS: 500.0000 mg | ORAL_CAPSULE | Freq: Two times a day (BID) | ORAL | 0 refills | Status: DC

## 2016-08-13 MED ORDER — APIXABAN 5 MG PO TABS: 10.0000 mg | ORAL_TABLET | Freq: Two times a day (BID) | ORAL | 1 refills | Status: DC

## 2016-08-13 NOTE — Care Management Important Message (Signed)
Important Message  Patient Details  [name] MRN: [id number] Date of Birth: Jul 08, 1929   Medicare Important Message Given:  Yes    Jolly Mango, RN 08/13/2016, 1:56 PM

## 2016-08-13 NOTE — Progress Notes (Signed)
Heparin gtt order expired. Spoke with DR. Pyreddy may renew the heparin gtt order.

## 2016-08-13 NOTE — Care Management (Signed)
[facility] notified of discharge. Eliquis coupon given. Checked price with [facility] ( $58.26). Daughter updated.

## 2016-08-13 NOTE — Progress Notes (Signed)
Clinical Education officer, museum (CSW) received verbal consult from PT that recommendation is SNF. Per PT patient's family adamantly declined SNF and are open to home health. RN case manager aware of above. Pleaser reconsult if future social work needs arise. CSW signing off.   [employer], LCSW [phone]

## 2016-08-13 NOTE — Progress Notes (Signed)
Inpatient Diabetes Program Recommendations  AACE/ADA: New Consensus Statement on Inpatient Glycemic Control (2015)  Target Ranges:  Prepandial:   less than 140 mg/dL      Peak postprandial:   less than 180 mg/dL (1-2 hours)      Critically ill patients:  140 - 180 mg/dL   Lab Results  Component Value Date   GLUCAP 152 (H) 08/13/2016   HGBA1C 7.6 (H) 08/11/2016    Review of Glycemic Control  Results for Tracy Mcmahon, Tracy Mcmahon (MRN [id number]) as of 08/13/2016 14:55  Ref. Range 08/12/2016 08:07 08/12/2016 11:47 08/12/2016 15:59 08/12/2016 20:37 08/13/2016 07:34  Glucose-Capillary Latest Ref Range: 65 - 99 mg/dL 151 (H) 242 (H) 245 (H) 259 (H) 152 (H)    Diabetes history: DM2 Outpatient Diabetes medications: Lantus 80 units daily at 1pm, Novolog 0-20 units BID (if needed based on correction scale)  Current orders for Inpatient glycemic control: Novolog 0-9 units TID with meals, Novolog 0-5 units QHS, Lantus 80 units qhs  Inpatient Diabetes Program Recommendations: Post prandial glucose is consistently elevated. Please consider ordering Novolog 4 units TID with meals for meal coverage.   Gentry Fitz, RN, BA, MHA, CDE Diabetes Coordinator Inpatient Diabetes Program  [phone] (Team Pager) [phone] ([place]) 08/13/2016 2:58 PM

## 2016-08-13 NOTE — Evaluation (Signed)
Physical Therapy Evaluation Patient Details [name] MRN: [id number] DOB: [birth year] Today's Date: 08/13/2016   History of Present Illness  Pt. presented to ED with LLE swelling and pain in conjunction with mild SOB, admitted with acute B PE's, LLE DVT and LLE cellulitis.  Clinical Impression  Pt. Awake, alert and oriented in bed upon arrival. Pt. Demonstrates decreased strength in RUE 4/5, unable to formally assess L UE strength due to hx. L shoulder arthritis and inability for pt. To perform AROM > 90deg shoulder flex/abduction grossly 3/5. Pt. Demonstrates B LE strength grossly 4-/5 symmetrical. Prior to activity pt. spO2 93%. Pt. Demonstrated ability to stand from EOB and transfer approx. 5 steps to chair with use of RW and min A, following this activity her spO2 85% but recovered with seated rest break. Pt. Able to ambulate with approx. 30 feet with RW CGA demonstrating slow cadence but equal step length and weight bearing symmetrically, no LOB or evidence of buckling, pt. Did report fatigue with this activity spO2 98% . Overall her activity tolerance is limited due to her gross decrease in strength and endurance as well as pulmonary capacity, requiring seated rest breaks throughout session today. Would benefit from skilled PT to address above deficits and promote optimal return to PLOF Recommend SNF placement upon d/c however, pt. And family refuse but are agreeable to home health PT following d/c.   She requires min A for bed mobility, transfers, and ambulation.     Follow Up Recommendations Supervision/Assistance - 24 hour;SNF (Discussed SNF recommendation with pt. and family present. All refused this option but agreeable to home health PT. Case management  informed/aware)    Equipment Recommendations  Rolling walker with 5" wheels    Recommendations for Other Services       Precautions / Restrictions Precautions Precautions: Fall Restrictions Weight Bearing Restrictions:  No      Mobility  Bed Mobility Overal bed mobility: Needs Assistance Bed Mobility: Supine to Sit     Supine to sit: Min guard     General bed mobility comments: requires B UE assist from bedrails and HOB elevated. Required assist to position fully at EOB  Transfers Overall transfer level: Needs assistance Equipment used: Rolling walker (2 wheeled) Transfers: Sit to/from Stand Sit to Stand: Min guard         General transfer comment: Pt. able to push from sitting surface with one UE, other supported on RW  Ambulation/Gait Ambulation/Gait assistance: Min guard Ambulation Distance (Feet): 30 Feet Assistive device: Rolling walker (2 wheeled)       General Gait Details: Pt. demonstrates slow cadence, step through pattern and equal weight bearing B. decreased step length B.   Stairs            Wheelchair Mobility    Modified Rankin (Stroke Patients Only)       Balance Overall balance assessment: Needs assistance Sitting-balance support: Bilateral upper extremity supported;Feet supported Sitting balance-Leahy Scale: Fair     Standing balance support: Bilateral upper extremity supported Standing balance-Leahy Scale: Fair                               Pertinent Vitals/Pain Pain Assessment: 0-10 Pain Score: 5  Pain Location: LLE  Pain Intervention(s): Monitored during session    Home Living Family/patient expects to be discharged to:: Private residence Living Arrangements: Children Available Help at Discharge: Family Type of Home: House Home Access: Stairs to  enter Entrance Stairs-Rails: Right Entrance Stairs-Number of Steps: 3 platform steps  Home Layout: One level Home Equipment: Walker - 2 wheels      Prior Function Level of Independence: Needs assistance   Gait / Transfers Assistance Needed: Pt. sleeps in lift chair, does not perform bed mobility. assist from family for ADL's            Hand Dominance         Extremity/Trunk Assessment   Upper Extremity Assessment: LUE deficits/detail;Generalized weakness;RUE deficits/detail   RUE:  (R UE grossly 4/5 ) RUE Sensation:  (sensation intact ) LUE Deficits / Details: Unable to formally assess due to Hx. arthritis in L shoulder. Pt. able to raise against gravity up to 90deg. shoulder flexion/abduction    Lower Extremity Assessment: Generalized weakness (B LE strength grossly 4-/5 symmetrical)         Communication   Communication: No difficulties  Cognition Arousal/Alertness: Awake/alert Behavior During Therapy: WFL for tasks assessed/performed Overall Cognitive Status: Within Functional Limits for tasks assessed                      General Comments      Exercises     Assessment/Plan    PT Assessment Patient needs continued PT services  PT Problem List Decreased strength;Decreased range of motion;Decreased mobility;Decreased activity tolerance;Decreased balance          PT Treatment Interventions DME instruction;Gait training;Stair training;Functional mobility training;Balance training;Therapeutic activities;Therapeutic exercise;Patient/family education    PT Goals (Current goals can be found in the Care Plan section)  Acute Rehab PT Goals Patient Stated Goal: Pt. would like to return home  PT Goal Formulation: With patient Time For Goal Achievement: 08/27/16 Potential to Achieve Goals: Fair    Frequency Min 2X/week   Barriers to discharge        Co-evaluation               End of Session Equipment Utilized During Treatment: Gait belt;Oxygen (2L) Activity Tolerance: Patient limited by fatigue (pt. demonstrates decreased endurance and fatigue following short bouts of activity including transfer to EOB, transfer to chair and ambulation within the room) Patient left: in chair;with chair alarm set;with call bell/phone within reach;with family/visitor present (nasal cannula reattached 2L)           Time:  9166-0600 PT Time Calculation (min) (ACUTE ONLY): 31 min   Charges:         PT G Codes:         Melanie Crazier, SPT  08/13/16,4:43 PM

## 2016-08-13 NOTE — Progress Notes (Signed)
Spoke with the pharmacist Rodman Key. Heparin gtt discontinued.

## 2016-08-14 DIAGNOSIS — R0902 Hypoxemia: Secondary | ICD-10-CM | POA: Diagnosis not present

## 2016-08-14 DIAGNOSIS — M6080 Other myositis, unspecified site: Secondary | ICD-10-CM | POA: Diagnosis not present

## 2016-08-14 DIAGNOSIS — I10 Essential (primary) hypertension: Secondary | ICD-10-CM | POA: Diagnosis not present

## 2016-08-17 DIAGNOSIS — L03116 Cellulitis of left lower limb: Secondary | ICD-10-CM | POA: Diagnosis not present

## 2016-08-17 DIAGNOSIS — I129 Hypertensive chronic kidney disease with stage 1 through stage 4 chronic kidney disease, or unspecified chronic kidney disease: Secondary | ICD-10-CM | POA: Diagnosis not present

## 2016-08-17 DIAGNOSIS — E1122 Type 2 diabetes mellitus with diabetic chronic kidney disease: Secondary | ICD-10-CM | POA: Diagnosis not present

## 2016-08-17 DIAGNOSIS — E1142 Type 2 diabetes mellitus with diabetic polyneuropathy: Secondary | ICD-10-CM | POA: Diagnosis not present

## 2016-08-17 DIAGNOSIS — J449 Chronic obstructive pulmonary disease, unspecified: Secondary | ICD-10-CM | POA: Diagnosis not present

## 2016-08-17 DIAGNOSIS — I2699 Other pulmonary embolism without acute cor pulmonale: Secondary | ICD-10-CM | POA: Diagnosis not present

## 2016-08-17 DIAGNOSIS — N183 Chronic kidney disease, stage 3 (moderate): Secondary | ICD-10-CM | POA: Diagnosis not present

## 2016-08-17 DIAGNOSIS — G2581 Restless legs syndrome: Secondary | ICD-10-CM | POA: Diagnosis not present

## 2016-08-17 DIAGNOSIS — I82402 Acute embolism and thrombosis of unspecified deep veins of left lower extremity: Secondary | ICD-10-CM | POA: Diagnosis not present

## 2016-08-21 DIAGNOSIS — N183 Chronic kidney disease, stage 3 (moderate): Secondary | ICD-10-CM | POA: Diagnosis not present

## 2016-08-21 DIAGNOSIS — E1142 Type 2 diabetes mellitus with diabetic polyneuropathy: Secondary | ICD-10-CM | POA: Diagnosis not present

## 2016-08-21 DIAGNOSIS — I82402 Acute embolism and thrombosis of unspecified deep veins of left lower extremity: Secondary | ICD-10-CM | POA: Diagnosis not present

## 2016-08-21 DIAGNOSIS — I129 Hypertensive chronic kidney disease with stage 1 through stage 4 chronic kidney disease, or unspecified chronic kidney disease: Secondary | ICD-10-CM | POA: Diagnosis not present

## 2016-08-21 DIAGNOSIS — I2699 Other pulmonary embolism without acute cor pulmonale: Secondary | ICD-10-CM | POA: Diagnosis not present

## 2016-08-21 DIAGNOSIS — E1122 Type 2 diabetes mellitus with diabetic chronic kidney disease: Secondary | ICD-10-CM | POA: Diagnosis not present

## 2016-08-21 DIAGNOSIS — L03116 Cellulitis of left lower limb: Secondary | ICD-10-CM | POA: Diagnosis not present

## 2016-08-21 DIAGNOSIS — G2581 Restless legs syndrome: Secondary | ICD-10-CM | POA: Diagnosis not present

## 2016-08-21 DIAGNOSIS — J449 Chronic obstructive pulmonary disease, unspecified: Secondary | ICD-10-CM | POA: Diagnosis not present

## 2016-08-27 DIAGNOSIS — I2699 Other pulmonary embolism without acute cor pulmonale: Secondary | ICD-10-CM | POA: Diagnosis not present

## 2016-08-27 DIAGNOSIS — E1142 Type 2 diabetes mellitus with diabetic polyneuropathy: Secondary | ICD-10-CM | POA: Diagnosis not present

## 2016-08-27 DIAGNOSIS — G2581 Restless legs syndrome: Secondary | ICD-10-CM | POA: Diagnosis not present

## 2016-08-27 DIAGNOSIS — L03116 Cellulitis of left lower limb: Secondary | ICD-10-CM | POA: Diagnosis not present

## 2016-08-27 DIAGNOSIS — N183 Chronic kidney disease, stage 3 (moderate): Secondary | ICD-10-CM | POA: Diagnosis not present

## 2016-08-27 DIAGNOSIS — J449 Chronic obstructive pulmonary disease, unspecified: Secondary | ICD-10-CM | POA: Diagnosis not present

## 2016-08-27 DIAGNOSIS — I82402 Acute embolism and thrombosis of unspecified deep veins of left lower extremity: Secondary | ICD-10-CM | POA: Diagnosis not present

## 2016-08-27 DIAGNOSIS — E1122 Type 2 diabetes mellitus with diabetic chronic kidney disease: Secondary | ICD-10-CM | POA: Diagnosis not present

## 2016-08-27 DIAGNOSIS — I129 Hypertensive chronic kidney disease with stage 1 through stage 4 chronic kidney disease, or unspecified chronic kidney disease: Secondary | ICD-10-CM | POA: Diagnosis not present

## 2016-09-10 DIAGNOSIS — E1142 Type 2 diabetes mellitus with diabetic polyneuropathy: Secondary | ICD-10-CM | POA: Diagnosis not present

## 2016-09-10 DIAGNOSIS — N183 Chronic kidney disease, stage 3 (moderate): Secondary | ICD-10-CM | POA: Diagnosis not present

## 2016-09-10 DIAGNOSIS — G2581 Restless legs syndrome: Secondary | ICD-10-CM | POA: Diagnosis not present

## 2016-09-10 DIAGNOSIS — I82402 Acute embolism and thrombosis of unspecified deep veins of left lower extremity: Secondary | ICD-10-CM | POA: Diagnosis not present

## 2016-09-10 DIAGNOSIS — I129 Hypertensive chronic kidney disease with stage 1 through stage 4 chronic kidney disease, or unspecified chronic kidney disease: Secondary | ICD-10-CM | POA: Diagnosis not present

## 2016-09-10 DIAGNOSIS — I2699 Other pulmonary embolism without acute cor pulmonale: Secondary | ICD-10-CM | POA: Diagnosis not present

## 2016-09-10 DIAGNOSIS — J449 Chronic obstructive pulmonary disease, unspecified: Secondary | ICD-10-CM | POA: Diagnosis not present

## 2016-09-10 DIAGNOSIS — E1122 Type 2 diabetes mellitus with diabetic chronic kidney disease: Secondary | ICD-10-CM | POA: Diagnosis not present

## 2016-09-10 DIAGNOSIS — L03116 Cellulitis of left lower limb: Secondary | ICD-10-CM | POA: Diagnosis not present

## 2016-09-10 NOTE — Discharge Summary (Signed)
[facility] [name]    MR#:  [id number]  DATE OF BIRTH:  06/12/1929  DATE OF ADMISSION:  08/10/2016 ADMITTING PHYSICIAN: Tracy Coon, MD  DATE OF DISCHARGE:08/14/18  PRIMARY CARE PHYSICIAN: Tracy Mcmahon, DAVID, MD    ADMISSION DIAGNOSIS:  Shortness of breath [R06.02] Acute deep vein thrombosis (DVT) of femoral vein of left lower extremity (HCC) [I82.412] Other acute pulmonary embolism [I26.99]  DISCHARGE DIAGNOSIS:  Acute bilateral PE and acute DVT left leg now on po eliquis  SECONDARY DIAGNOSIS:   Past Medical History:  Diagnosis Date  . Cancer ([place])   . Cataract   . Chronic kidney disease (CKD), stage III (moderate)   . Chronic urticaria   . COPD (chronic obstructive pulmonary disease) ([place])   . Depression   . Diabetes mellitus without complication ([place])   . DVT (deep venous thrombosis) ([facility])    left leg  . Hyperlipemia   . Hypertension   . Osteoarthritis   . Osteopenia   . Polymyalgia rheumatica ([place])   . Polyneuropathy ([place])   . Renal cancer ([place])   . Renal insufficiency 08/10/2016   Chart indicates CKD stage 3  . Restless leg syndrome   . Vitamin D deficiency   . Vulvar intraepithelial neoplasia with lichen sclerosus     HOSPITAL COURSE:   [name] a 82 y.o.femalewho presents with Lower extremity pain and swelling as well as some mildly increased shortness of breath for the last 2 days. She denies any fever, chills, or other symptoms of infection. She has COPD and wears 2 L of oxygen at home at baseline, and has not had to increase this, but does report some mildly increased shortness of breath with exertion  1.acute bilateral PE (pulmonary embolism) -patient was started on anticoagulation in the ED with Lovenox.  -Given that she has only one kidney and her GFR is borderline, we will switch this to heparin drip for now and can be started on oral anticoagulant-warfarin (she has taken it  in the past for DVT) -her anticoagulation will wilifelong due to recurrence of DVT -pt's creatinine/GFR has improved and so will place her on eliquis for rx purpose. This was d/w pharmacy  *left LE acuteDVT (deep venous thrombosis) (HCC) - anticoagulation as above.  *left LE cellulitis -IV cefazolin--change to po abxs  *Hypertension - currently stable, continue home meds  *Diabetes (HCC) - sliding scale insulin with corresponding glucose checks and heart healthy/carb modified diet  *Hyperlipidemia - home dose statin  *Depression - home dose antidepressant  *Restless leg syndrome - home dose gabapentin  *Chronic kidney disease - avoid nephrotoxins, monitor closely, patient is around her baseline renal function -pt is s/p nephrectomy in the past due to renal cancer  Overall imprving D/c todya  CONSULTS OBTAINED:    DRUG ALLERGIES:   Allergies  Allergen Reactions  . Fosamax [Alendronate Sodium] Other (See Comments)    Reaction:  Made pt "very sick"  . Wellbutrin [Bupropion] Other (See Comments)    Reaction:  Unknown    DISCHARGE MEDICATIONS:   Discharge Medication List as of 08/13/2016  3:10 PM    START taking these medications   Details  apixaban (ELIQUIS) 5 MG TABS tablet Take 2 tablets (10 mg total) by mouth 2 (two) times daily. 2 tabs bid for 7 days then 1 tab bid, Starting Tue 08/13/2016, Normal    cephALEXin (KEFLEX) 500 MG capsule Take 1 capsule (500 mg total) by mouth every  12 (twelve) hours., Starting Tue 08/13/2016, Normal      CONTINUE these medications which have NOT CHANGED   Details  alendronate (FOSAMAX) 35 MG tablet Take 35 mg by mouth every 7 (seven) days. Take with a full glass of water on an empty stomach., Historical Med    aspirin EC 81 MG tablet Take 81 mg by mouth daily., Until Discontinued, Historical Med    atenolol (TENORMIN) 25 MG tablet Take 25 mg by mouth 2 (two) times daily., Until Discontinued, Historical Med     FLUoxetine (PROZAC) 20 MG capsule Take 20 mg by mouth daily., Until Discontinued, Historical Med    furosemide (LASIX) 20 MG tablet Take 20 mg by mouth daily., Historical Med    gabapentin (NEURONTIN) 300 MG capsule Take 300-600 mg by mouth 3 (three) times daily. 1 capsule by mouth in the morning and 2 at bedtime, Historical Med    insulin aspart (NOVOLOG) 100 UNIT/ML injection Inject 0-20 Units into the skin See admin instructions. Pt uses as needed per sliding scale. Twice daily, Historical Med    insulin glargine (LANTUS) 100 UNIT/ML injection Inject 80 Units into the skin daily. 80 units at 1300 given as 40 units in two different sites, Historical Med    Multiple Minerals-Vitamins (GNP CAL MAG ZINC +D3 PO) Take 1 tablet by mouth daily., Historical Med    pravastatin (PRAVACHOL) 20 MG tablet Take 20 mg by mouth daily., Until Discontinued, Historical Med    predniSONE (DELTASONE) 5 MG tablet Take 5 mg by mouth daily. , Historical Med    vitamin B-12 (CYANOCOBALAMIN) 1000 MCG tablet Take 1,000 mcg by mouth daily., Until Discontinued, Historical Med    Vitamin D, Ergocalciferol, (DRISDOL) 50000 UNITS CAPS capsule Take 50,000 Units by mouth every 7 (seven) days., Until Discontinued, Historical Med        If you experience worsening of your admission symptoms, develop shortness of breath, life threatening emergency, suicidal or homicidal thoughts you must seek medical attention immediately by calling 911 or calling your MD immediately  if symptoms less severe.  You Must read complete instructions/literature along with all the possible adverse reactions/side effects for all the Medicines you take and that have been prescribed to you. Take any new Medicines after you have completely understood and accept all the possible adverse reactions/side effects.   Please note  You were cared for by a hospitalist during your hospital stay. If you have any questions about your discharge medications or  the care you received while you were in the hospital after you are discharged, you can call the unit and asked to speak with the hospitalist on call if the hospitalist that took care of you is not available. Once you are discharged, your primary care physician will handle any further medical issues. Please note that NO REFILLS for any discharge medications will be authorized once you are discharged, as it is imperative that you return to your primary care physician (or establish a relationship with a primary care physician if you do not have one) for your aftercare needs so that they can reassess your need for medications and monitor your lab values.  DATA REVIEW:   CBC  No results for input(s): WBC, HGB, HCT, PLT in the last 168 hours.  Chemistries  No results for input(s): NA, K, CL, CO2, GLUCOSE, BUN, CREATININE, CALCIUM, MG, AST, ALT, ALKPHOS, BILITOT in the last 168 hours.  Invalid input(s): [place]  Microbiology Results   No results found for this or  any previous visit (from the past 240 hour(s)).  RADIOLOGY:  No results found.   Management plans discussed with the patient, family and they are in agreement.  CODE STATUS:  Code Status History    Date Active Date Inactive Code Status Order ID Comments User Context   08/11/2016  3:24 AM 08/11/2016  9:37 AM Full Code [id number]  Tracy Coon, MD Inpatient   03/29/2015  9:15 PM 03/30/2015  3:55 PM Full Code [id number]  Nicholes Mango, MD Inpatient    Advance Directive Documentation   Flowsheet Row Most Recent Value  Type of Advance Directive  Healthcare Power of Attorney  Pre-existing out of facility DNR order (yellow form or pink MOST form)  No data  "MOST" Form in Place?  No data      TOTAL TIME TAKING CARE OF THIS PATIENT: 40 minutes.    Reign Dziuba M.D on 09/10/2016 at 1:37 PM  Between 7am to 6pm - Pager - [phone] After 6pm go to [email or web address] - password [email or web address] [facility]  Office   [phone]  CC: Primary care physician; [name], MD

## 2016-09-13 DIAGNOSIS — M6080 Other myositis, unspecified site: Secondary | ICD-10-CM | POA: Diagnosis not present

## 2016-09-13 DIAGNOSIS — R0902 Hypoxemia: Secondary | ICD-10-CM | POA: Diagnosis not present

## 2016-09-13 DIAGNOSIS — I10 Essential (primary) hypertension: Secondary | ICD-10-CM | POA: Diagnosis not present

## 2016-09-17 DIAGNOSIS — Z86711 Personal history of pulmonary embolism: Secondary | ICD-10-CM | POA: Diagnosis not present

## 2016-09-17 DIAGNOSIS — J069 Acute upper respiratory infection, unspecified: Secondary | ICD-10-CM | POA: Diagnosis not present

## 2016-09-17 DIAGNOSIS — E1122 Type 2 diabetes mellitus with diabetic chronic kidney disease: Secondary | ICD-10-CM | POA: Diagnosis not present

## 2016-09-17 DIAGNOSIS — I10 Essential (primary) hypertension: Secondary | ICD-10-CM | POA: Diagnosis not present

## 2016-09-17 DIAGNOSIS — B9789 Other viral agents as the cause of diseases classified elsewhere: Secondary | ICD-10-CM | POA: Diagnosis not present

## 2016-09-17 DIAGNOSIS — D631 Anemia in chronic kidney disease: Secondary | ICD-10-CM | POA: Diagnosis not present

## 2016-09-17 DIAGNOSIS — M19012 Primary osteoarthritis, left shoulder: Secondary | ICD-10-CM | POA: Diagnosis not present

## 2016-09-17 DIAGNOSIS — N183 Chronic kidney disease, stage 3 (moderate): Secondary | ICD-10-CM | POA: Diagnosis not present

## 2016-09-17 DIAGNOSIS — M353 Polymyalgia rheumatica: Secondary | ICD-10-CM | POA: Diagnosis not present

## 2016-09-24 DIAGNOSIS — E1142 Type 2 diabetes mellitus with diabetic polyneuropathy: Secondary | ICD-10-CM | POA: Diagnosis not present

## 2016-09-24 DIAGNOSIS — J449 Chronic obstructive pulmonary disease, unspecified: Secondary | ICD-10-CM | POA: Diagnosis not present

## 2016-09-24 DIAGNOSIS — E1122 Type 2 diabetes mellitus with diabetic chronic kidney disease: Secondary | ICD-10-CM | POA: Diagnosis not present

## 2016-09-24 DIAGNOSIS — N183 Chronic kidney disease, stage 3 (moderate): Secondary | ICD-10-CM | POA: Diagnosis not present

## 2016-09-24 DIAGNOSIS — G2581 Restless legs syndrome: Secondary | ICD-10-CM | POA: Diagnosis not present

## 2016-09-24 DIAGNOSIS — I82402 Acute embolism and thrombosis of unspecified deep veins of left lower extremity: Secondary | ICD-10-CM | POA: Diagnosis not present

## 2016-09-24 DIAGNOSIS — I2699 Other pulmonary embolism without acute cor pulmonale: Secondary | ICD-10-CM | POA: Diagnosis not present

## 2016-09-24 DIAGNOSIS — I129 Hypertensive chronic kidney disease with stage 1 through stage 4 chronic kidney disease, or unspecified chronic kidney disease: Secondary | ICD-10-CM | POA: Diagnosis not present

## 2016-09-24 DIAGNOSIS — L03116 Cellulitis of left lower limb: Secondary | ICD-10-CM | POA: Diagnosis not present

## 2016-10-02 DIAGNOSIS — M25512 Pain in left shoulder: Secondary | ICD-10-CM | POA: Diagnosis not present

## 2016-10-02 DIAGNOSIS — R700 Elevated erythrocyte sedimentation rate: Secondary | ICD-10-CM | POA: Diagnosis not present

## 2016-10-02 DIAGNOSIS — G8929 Other chronic pain: Secondary | ICD-10-CM | POA: Diagnosis not present

## 2016-10-02 DIAGNOSIS — J439 Emphysema, unspecified: Secondary | ICD-10-CM | POA: Diagnosis not present

## 2016-10-09 DIAGNOSIS — E1122 Type 2 diabetes mellitus with diabetic chronic kidney disease: Secondary | ICD-10-CM | POA: Diagnosis not present

## 2016-10-09 DIAGNOSIS — I2699 Other pulmonary embolism without acute cor pulmonale: Secondary | ICD-10-CM | POA: Diagnosis not present

## 2016-10-09 DIAGNOSIS — I82402 Acute embolism and thrombosis of unspecified deep veins of left lower extremity: Secondary | ICD-10-CM | POA: Diagnosis not present

## 2016-10-09 DIAGNOSIS — L03116 Cellulitis of left lower limb: Secondary | ICD-10-CM | POA: Diagnosis not present

## 2016-10-11 DIAGNOSIS — L03116 Cellulitis of left lower limb: Secondary | ICD-10-CM | POA: Diagnosis not present

## 2016-10-11 DIAGNOSIS — G2581 Restless legs syndrome: Secondary | ICD-10-CM | POA: Diagnosis not present

## 2016-10-11 DIAGNOSIS — E1122 Type 2 diabetes mellitus with diabetic chronic kidney disease: Secondary | ICD-10-CM | POA: Diagnosis not present

## 2016-10-11 DIAGNOSIS — N183 Chronic kidney disease, stage 3 (moderate): Secondary | ICD-10-CM | POA: Diagnosis not present

## 2016-10-11 DIAGNOSIS — I2699 Other pulmonary embolism without acute cor pulmonale: Secondary | ICD-10-CM | POA: Diagnosis not present

## 2016-10-11 DIAGNOSIS — I129 Hypertensive chronic kidney disease with stage 1 through stage 4 chronic kidney disease, or unspecified chronic kidney disease: Secondary | ICD-10-CM | POA: Diagnosis not present

## 2016-10-11 DIAGNOSIS — E1142 Type 2 diabetes mellitus with diabetic polyneuropathy: Secondary | ICD-10-CM | POA: Diagnosis not present

## 2016-10-11 DIAGNOSIS — I82402 Acute embolism and thrombosis of unspecified deep veins of left lower extremity: Secondary | ICD-10-CM | POA: Diagnosis not present

## 2016-10-11 DIAGNOSIS — J449 Chronic obstructive pulmonary disease, unspecified: Secondary | ICD-10-CM | POA: Diagnosis not present

## 2016-10-14 DIAGNOSIS — R0902 Hypoxemia: Secondary | ICD-10-CM | POA: Diagnosis not present

## 2016-10-14 DIAGNOSIS — I10 Essential (primary) hypertension: Secondary | ICD-10-CM | POA: Diagnosis not present

## 2016-10-14 DIAGNOSIS — M6080 Other myositis, unspecified site: Secondary | ICD-10-CM | POA: Diagnosis not present

## 2016-10-21 DIAGNOSIS — E119 Type 2 diabetes mellitus without complications: Secondary | ICD-10-CM | POA: Diagnosis not present

## 2016-10-21 DIAGNOSIS — H353134 Nonexudative age-related macular degeneration, bilateral, advanced atrophic with subfoveal involvement: Secondary | ICD-10-CM | POA: Diagnosis not present

## 2016-10-21 DIAGNOSIS — H35721 Serous detachment of retinal pigment epithelium, right eye: Secondary | ICD-10-CM | POA: Diagnosis not present

## 2016-10-21 DIAGNOSIS — H35372 Puckering of macula, left eye: Secondary | ICD-10-CM | POA: Diagnosis not present

## 2016-10-21 DIAGNOSIS — H353212 Exudative age-related macular degeneration, right eye, with inactive choroidal neovascularization: Secondary | ICD-10-CM | POA: Diagnosis not present

## 2016-11-13 DIAGNOSIS — R0902 Hypoxemia: Secondary | ICD-10-CM | POA: Diagnosis not present

## 2016-11-13 DIAGNOSIS — M6080 Other myositis, unspecified site: Secondary | ICD-10-CM | POA: Diagnosis not present

## 2016-11-13 DIAGNOSIS — I10 Essential (primary) hypertension: Secondary | ICD-10-CM | POA: Diagnosis not present

## 2016-12-14 DIAGNOSIS — I10 Essential (primary) hypertension: Secondary | ICD-10-CM | POA: Diagnosis not present

## 2016-12-14 DIAGNOSIS — M6080 Other myositis, unspecified site: Secondary | ICD-10-CM | POA: Diagnosis not present

## 2016-12-14 DIAGNOSIS — R0902 Hypoxemia: Secondary | ICD-10-CM | POA: Diagnosis not present

## 2016-12-16 ENCOUNTER — Emergency Department: Payer: Medicare HMO

## 2016-12-16 ENCOUNTER — Inpatient Hospital Stay
Admission: EM | Admit: 2016-12-16 | Discharge: 2016-12-19 | DRG: 871 | Disposition: A | Discharge status: Home / Self Care | Payer: Medicare HMO | Attending: Internal Medicine | Admitting: Internal Medicine

## 2016-12-16 ENCOUNTER — Encounter: Payer: Self-pay | Admitting: Emergency Medicine

## 2016-12-16 DIAGNOSIS — R262 Difficulty in walking, not elsewhere classified: Secondary | ICD-10-CM

## 2016-12-16 DIAGNOSIS — I129 Hypertensive chronic kidney disease with stage 1 through stage 4 chronic kidney disease, or unspecified chronic kidney disease: Secondary | ICD-10-CM | POA: Diagnosis present

## 2016-12-16 DIAGNOSIS — E872 Acidosis, unspecified: Secondary | ICD-10-CM

## 2016-12-16 DIAGNOSIS — M353 Polymyalgia rheumatica: Secondary | ICD-10-CM | POA: Diagnosis present

## 2016-12-16 DIAGNOSIS — Z7982 Long term (current) use of aspirin: Secondary | ICD-10-CM

## 2016-12-16 DIAGNOSIS — E1122 Type 2 diabetes mellitus with diabetic chronic kidney disease: Secondary | ICD-10-CM | POA: Diagnosis present

## 2016-12-16 DIAGNOSIS — J441 Chronic obstructive pulmonary disease with (acute) exacerbation: Secondary | ICD-10-CM

## 2016-12-16 DIAGNOSIS — M6281 Muscle weakness (generalized): Secondary | ICD-10-CM

## 2016-12-16 DIAGNOSIS — Z86711 Personal history of pulmonary embolism: Secondary | ICD-10-CM

## 2016-12-16 DIAGNOSIS — J189 Pneumonia, unspecified organism: Secondary | ICD-10-CM | POA: Diagnosis not present

## 2016-12-16 DIAGNOSIS — G9340 Encephalopathy, unspecified: Secondary | ICD-10-CM

## 2016-12-16 DIAGNOSIS — G2581 Restless legs syndrome: Secondary | ICD-10-CM | POA: Diagnosis present

## 2016-12-16 DIAGNOSIS — E785 Hyperlipidemia, unspecified: Secondary | ICD-10-CM | POA: Diagnosis present

## 2016-12-16 DIAGNOSIS — R6520 Severe sepsis without septic shock: Secondary | ICD-10-CM | POA: Diagnosis present

## 2016-12-16 DIAGNOSIS — Z9849 Cataract extraction status, unspecified eye: Secondary | ICD-10-CM | POA: Diagnosis not present

## 2016-12-16 DIAGNOSIS — Z79899 Other long term (current) drug therapy: Secondary | ICD-10-CM

## 2016-12-16 DIAGNOSIS — A419 Sepsis, unspecified organism: Principal | ICD-10-CM | POA: Diagnosis present

## 2016-12-16 DIAGNOSIS — Z87412 Personal history of vulvar dysplasia: Secondary | ICD-10-CM

## 2016-12-16 DIAGNOSIS — J9622 Acute and chronic respiratory failure with hypercapnia: Secondary | ICD-10-CM | POA: Diagnosis not present

## 2016-12-16 DIAGNOSIS — J440 Chronic obstructive pulmonary disease with acute lower respiratory infection: Secondary | ICD-10-CM | POA: Diagnosis not present

## 2016-12-16 DIAGNOSIS — M8580 Other specified disorders of bone density and structure, unspecified site: Secondary | ICD-10-CM | POA: Diagnosis present

## 2016-12-16 DIAGNOSIS — Z888 Allergy status to other drugs, medicaments and biological substances status: Secondary | ICD-10-CM

## 2016-12-16 DIAGNOSIS — J9621 Acute and chronic respiratory failure with hypoxia: Secondary | ICD-10-CM

## 2016-12-16 DIAGNOSIS — Z794 Long term (current) use of insulin: Secondary | ICD-10-CM

## 2016-12-16 DIAGNOSIS — G9341 Metabolic encephalopathy: Secondary | ICD-10-CM | POA: Diagnosis not present

## 2016-12-16 DIAGNOSIS — Z833 Family history of diabetes mellitus: Secondary | ICD-10-CM

## 2016-12-16 DIAGNOSIS — Z96643 Presence of artificial hip joint, bilateral: Secondary | ICD-10-CM | POA: Diagnosis present

## 2016-12-16 DIAGNOSIS — M1990 Unspecified osteoarthritis, unspecified site: Secondary | ICD-10-CM | POA: Diagnosis present

## 2016-12-16 DIAGNOSIS — I2510 Atherosclerotic heart disease of native coronary artery without angina pectoris: Secondary | ICD-10-CM | POA: Diagnosis present

## 2016-12-16 DIAGNOSIS — N183 Chronic kidney disease, stage 3 (moderate): Secondary | ICD-10-CM | POA: Diagnosis not present

## 2016-12-16 DIAGNOSIS — Z86718 Personal history of other venous thrombosis and embolism: Secondary | ICD-10-CM

## 2016-12-16 DIAGNOSIS — Z85528 Personal history of other malignant neoplasm of kidney: Secondary | ICD-10-CM

## 2016-12-16 DIAGNOSIS — Z9981 Dependence on supplemental oxygen: Secondary | ICD-10-CM

## 2016-12-16 DIAGNOSIS — Z905 Acquired absence of kidney: Secondary | ICD-10-CM | POA: Diagnosis not present

## 2016-12-16 DIAGNOSIS — Z90710 Acquired absence of both cervix and uterus: Secondary | ICD-10-CM

## 2016-12-16 DIAGNOSIS — D649 Anemia, unspecified: Secondary | ICD-10-CM | POA: Diagnosis present

## 2016-12-16 DIAGNOSIS — R05 Cough: Secondary | ICD-10-CM | POA: Diagnosis not present

## 2016-12-16 DIAGNOSIS — R531 Weakness: Secondary | ICD-10-CM

## 2016-12-16 DIAGNOSIS — F329 Major depressive disorder, single episode, unspecified: Secondary | ICD-10-CM | POA: Diagnosis present

## 2016-12-16 DIAGNOSIS — Z8249 Family history of ischemic heart disease and other diseases of the circulatory system: Secondary | ICD-10-CM

## 2016-12-16 DIAGNOSIS — Z9049 Acquired absence of other specified parts of digestive tract: Secondary | ICD-10-CM

## 2016-12-16 LAB — CBC WITH DIFFERENTIAL/PLATELET
Basophils Absolute: 0 10*3/uL (ref 0–0.1)
Basophils Relative: 0 %
EOS PCT: 0 %
Eosinophils Absolute: 0 10*3/uL (ref 0–0.7)
HEMATOCRIT: 30.4 % — AB (ref 35.0–47.0)
Hemoglobin: 9.6 g/dL — ABNORMAL LOW (ref 12.0–16.0)
LYMPHS ABS: 1.5 10*3/uL (ref 1.0–3.6)
LYMPHS PCT: 8 %
MCH: 27.3 pg (ref 26.0–34.0)
MCHC: 31.6 g/dL — ABNORMAL LOW (ref 32.0–36.0)
MCV: 86.4 fL (ref 80.0–100.0)
MONO ABS: 1.7 10*3/uL — AB (ref 0.2–0.9)
Monocytes Relative: 8 %
Neutro Abs: 16.4 10*3/uL — ABNORMAL HIGH (ref 1.4–6.5)
Neutrophils Relative %: 84 %
PLATELETS: 334 10*3/uL (ref 150–440)
RBC: 3.52 MIL/uL — ABNORMAL LOW (ref 3.80–5.20)
RDW: 16.8 % — AB (ref 11.5–14.5)
WBC: 19.6 10*3/uL — ABNORMAL HIGH (ref 3.6–11.0)

## 2016-12-16 LAB — BASIC METABOLIC PANEL
ANION GAP: 10 (ref 5–15)
BUN: 22 mg/dL — AB (ref 6–20)
CALCIUM: 8.9 mg/dL (ref 8.9–10.3)
CO2: 33 mmol/L — ABNORMAL HIGH (ref 22–32)
Chloride: 92 mmol/L — ABNORMAL LOW (ref 101–111)
Creatinine, Ser: 1.32 mg/dL — ABNORMAL HIGH (ref 0.44–1.00)
GFR calc Af Amer: 41 mL/min — ABNORMAL LOW (ref >60)
GFR, EST NON AFRICAN AMERICAN: 36 mL/min — AB (ref >60)
GLUCOSE: 280 mg/dL — AB (ref 65–99)
POTASSIUM: 4.6 mmol/L (ref 3.5–5.1)
Sodium: 135 mmol/L (ref 135–145)

## 2016-12-16 LAB — BLOOD GAS, ARTERIAL
ACID-BASE EXCESS: 11.7 mmol/L — AB (ref 0.0–2.0)
Bicarbonate: 37 mmol/L — ABNORMAL HIGH (ref 20.0–28.0)
FIO2: 0.28
O2 SAT: 93 %
PCO2 ART: 52 mmHg — AB (ref 32.0–48.0)
Patient temperature: 37
pH, Arterial: 7.46 — ABNORMAL HIGH (ref 7.350–7.450)
pO2, Arterial: 63 mmHg — ABNORMAL LOW (ref 83.0–108.0)

## 2016-12-16 LAB — LACTIC ACID, PLASMA
Lactic Acid, Venous: 2.1 mmol/L (ref 0.5–1.9)
Lactic Acid, Venous: 1.1 mmol/L (ref 0.5–1.9)

## 2016-12-16 LAB — INFLUENZA PANEL BY PCR (TYPE A & B)
INFLAPCR: NEGATIVE
INFLBPCR: NEGATIVE

## 2016-12-16 MED ORDER — DEXTROSE 5 % IV SOLN: 1.0000 g | Freq: Once | INTRAVENOUS | Status: DC

## 2016-12-16 MED ORDER — CEFTRIAXONE SODIUM-DEXTROSE 1-3.74 GM-% IV SOLR
INTRAVENOUS | Status: AC
  Filled 2016-12-16: qty 50

## 2016-12-16 MED ORDER — CEFTRIAXONE SODIUM-DEXTROSE 1-3.74 GM-% IV SOLR
1.0000 g | Freq: Once | INTRAVENOUS | Status: AC
  Administered 2016-12-16: 1 g via INTRAVENOUS

## 2016-12-16 NOTE — ED Notes (Signed)
Reviewed lab/CXR findings; charge nurse notified of such

## 2016-12-16 NOTE — H&P (Addendum)
[facility] [name]    MR#:  [id number]  DATE OF BIRTH:  Aug 23, 1929  DATE OF ADMISSION:  12/16/2016  PRIMARY CARE PHYSICIAN: [name], MD   REQUESTING/REFERRING PHYSICIAN:   CHIEF COMPLAINT:   Chief Complaint  Patient presents with  . Cough  . Fever    HISTORY OF PRESENT ILLNESS: Tracy Mcmahon  is a 83 y.o. female with a known history of Multiple medical problems including chronic respiratory failure, on 2 L of oxygen to home. 24 7, COPD, CAD stage III, 1 kidney, diabetes, hypertension, hyperlipidemia, renal cancer, recent diagnosis of left lower extremity DVT and pulmonary embolism, now on a liquids anticoagulation, who presents to the hospital with complaints of cough for the past 1-2 days, fever and feeling weak. She was seen by Dr. Dorthula Perfect earlier today and was sent to emergency room for further evaluation and treatment. In emergency room, x-ray revealed pneumonia and hospitalist services were contacted for admission. Patient was noted to be febrile on admission, had elevated white blood cell count, lactic acid level.  PAST MEDICAL HISTORY:   Past Medical History:  Diagnosis Date  . Cancer ([place])   . Cataract   . Chronic kidney disease (CKD), stage III (moderate)   . Chronic urticaria   . COPD (chronic obstructive pulmonary disease) ([place])   . Depression   . Diabetes mellitus without complication ([place])   . DVT (deep venous thrombosis) (HCC)    left leg  . Hyperlipemia   . Hypertension   . Osteoarthritis   . Osteopenia   . Polymyalgia rheumatica ([place])   . Polyneuropathy ([place])   . Renal cancer ([place])   . Renal insufficiency 08/10/2016   Chart indicates CKD stage 3  . Restless leg syndrome   . Vitamin D deficiency   . Vulvar intraepithelial neoplasia with lichen sclerosus     PAST SURGICAL HISTORY: Past Surgical History:  Procedure Laterality Date  . ABDOMINAL HYSTERECTOMY    . APPENDECTOMY    .  CATARACT EXTRACTION    . COLONOSCOPY    . JOINT REPLACEMENT Bilateral    hip  . LAMINOTOMY    . NEPHRECTOMY    . TEMPORAL ARTERY BIOPSY / LIGATION    . TONSILLECTOMY      SOCIAL HISTORY:  Social History  Substance Use Topics  . Smoking status: Never Smoker  . Smokeless tobacco: Never Used  . Alcohol use No    FAMILY HISTORY:  Family History  Problem Relation Age of Onset  . Hypertension    . Diabetes      DRUG ALLERGIES:  Allergies  Allergen Reactions  . Fosamax [Alendronate Sodium] Other (See Comments)    Reaction:  Made pt "very sick"  . Wellbutrin [Bupropion] Other (See Comments)    Reaction:  Unknown    Review of Systems  Unable to perform ROS: Mental acuity  Constitutional: Positive for chills, fever and malaise/fatigue. Negative for weight loss.  HENT: Negative for congestion.   Eyes: Negative for blurred vision and double vision.  Respiratory: Positive for cough and shortness of breath. Negative for sputum production and wheezing.   Cardiovascular: Negative for chest pain, palpitations, orthopnea, leg swelling and PND.  Gastrointestinal: Negative for abdominal pain, blood in stool, constipation, diarrhea, nausea and vomiting.  Genitourinary: Negative for dysuria, frequency, hematuria and urgency.  Musculoskeletal: Positive for myalgias. Negative for falls.  Neurological: Negative for dizziness, tremors, focal weakness and headaches.  Endo/Heme/Allergies: Does  not bruise/bleed easily.  Psychiatric/Behavioral: Negative for depression. The patient does not have insomnia.     MEDICATIONS AT HOME:  Prior to Admission medications   Medication Sig Start Date End Date Taking? Authorizing Provider  apixaban (ELIQUIS) 5 MG TABS tablet Take 2 tablets (10 mg total) by mouth 2 (two) times daily. 2 tabs bid for 7 days then 1 tab bid Patient taking differently: Take 5 mg by mouth 2 (two) times daily. 2 tabs bid for 7 days then 1 tab bid 08/13/16  Yes Fritzi Mandes, MD   aspirin EC 81 MG tablet Take 81 mg by mouth daily.   Yes Historical Provider, MD  atenolol (TENORMIN) 25 MG tablet Take 25 mg by mouth 2 (two) times daily.   Yes Historical Provider, MD  FLUoxetine (PROZAC) 20 MG capsule Take 20 mg by mouth daily.   Yes Historical Provider, MD  furosemide (LASIX) 20 MG tablet Take 20 mg by mouth daily.   Yes Historical Provider, MD  gabapentin (NEURONTIN) 300 MG capsule Take 300-600 mg by mouth 3 (three) times daily. 1 capsule by mouth in the morning and 2 at bedtime   Yes Historical Provider, MD  insulin aspart (NOVOLOG) 100 UNIT/ML injection Inject 0-20 Units into the skin See admin instructions. Pt uses as needed per sliding scale. Twice daily   Yes Historical Provider, MD  insulin glargine (LANTUS) 100 UNIT/ML injection Inject 80 Units into the skin daily. 80 units at 1300 given as 40 units in two different sites   Yes Historical Provider, MD  Multiple Minerals-Vitamins (GNP CAL MAG ZINC +D3 PO) Take 1 tablet by mouth daily.   Yes Historical Provider, MD  pravastatin (PRAVACHOL) 20 MG tablet Take 20 mg by mouth daily.   Yes Historical Provider, MD  predniSONE (DELTASONE) 5 MG tablet Take 7.5 mg by mouth daily.    Yes Historical Provider, MD  vitamin B-12 (CYANOCOBALAMIN) 1000 MCG tablet Take 1,000 mcg by mouth daily.   Yes Historical Provider, MD  Vitamin D, Ergocalciferol, (DRISDOL) 50000 UNITS CAPS capsule Take 50,000 Units by mouth every 7 (seven) days. monday   Yes Historical Provider, MD  alendronate (FOSAMAX) 35 MG tablet Take 35 mg by mouth every 7 (seven) days. Take with a full glass of water on an empty stomach.    Historical Provider, MD  cephALEXin (KEFLEX) 500 MG capsule Take 1 capsule (500 mg total) by mouth every 12 (twelve) hours. Patient not taking: Reported on 12/16/2016 08/13/16   Fritzi Mandes, MD      PHYSICAL EXAMINATION:   VITAL SIGNS: Blood pressure (!) 130/59, pulse 95, temperature (!) 100.4 F (38 C), temperature source Oral, resp. rate  20, height 5' (1.524 m), weight 90.7 kg (200 lb), SpO2 91 %.  GENERAL:  83 y.o.-year-old patient lying in the bed In moderate distress, somnolent, although arousable, opens eyes and able to briefly converses, than drifts back to sleep.  EYES: Pupils equal, round, reactive to light and accommodation. No scleral icterus. Extraocular muscles intact.  HEENT: Head atraumatic, normocephalic. Oropharynx and nasopharynx clear.  NECK:  Supple, no jugular venous distention. No thyroid enlargement, no tenderness.  LUNGS: Diminished breath sounds bilaterally, no wheezing, few scattered rales,rhonchi and crepitations anteriorly and posteriorly. Intermittent use of accessory muscles of respiration.  CARDIOVASCULAR: S1, S2 normal. No murmurs, rubs, or gallops, distant.  ABDOMEN: Soft, nontender, nondistended. Bowel sounds present. No organomegaly or mass.  EXTREMITIES: Trace to 1+ lower extremity and pedal edema bilaterally, no calf tenderness, no cyanosis, or  clubbing.  NEUROLOGIC: Cranial nerves II through XII are intact. Muscle strength 5/5 in all extremities. Sensation intact. Gait not checked.  PSYCHIATRIC: The patient is somnolent and oriented x 3.  SKIN: No obvious rash, lesion, or ulcer. After discoloration of bilateral lower extremities due to venous stasis  LABORATORY PANEL:   CBC  Recent Labs Lab 12/16/16 1628  WBC 19.6*  HGB 9.6*  HCT 30.4*  PLT 334  MCV 86.4  MCH 27.3  MCHC 31.6*  RDW 16.8*  LYMPHSABS 1.5  MONOABS 1.7*  EOSABS 0.0  BASOSABS 0.0   ------------------------------------------------------------------------------------------------------------------  Chemistries   Recent Labs Lab 12/16/16 1628  NA 135  K 4.6  CL 92*  CO2 33*  GLUCOSE 280*  BUN 22*  CREATININE 1.32*  CALCIUM 8.9   ------------------------------------------------------------------------------------------------------------------  Cardiac Enzymes No results for input(s): TROPONINI in the last  168 hours. ------------------------------------------------------------------------------------------------------------------  RADIOLOGY: Dg Chest 2 View  Result Date: 12/16/2016 CLINICAL DATA:  Cough, fever EXAM: CHEST  2 VIEW COMPARISON:  08/10/2016 FINDINGS: Area consolidation noted in the right upper lobe as well as both lower lobes concerning for pneumonia. Small bilateral pleural effusions. Mild cardiomegaly. No acute bony abnormality. IMPRESSION: Multifocal pneumonia. Small bilateral effusions. Electronically Signed   By: Rolm Baptise M.D.   On: 12/16/2016 16:58    EKG: Orders placed or performed during the hospital encounter of 03/29/15  . EKG 12-Lead  . EKG 12-Lead  . EKG    IMPRESSION AND PLAN:  Active Problems:   Acute on chronic respiratory failure with hypoxia and hypercapnia (HCC)   COPD exacerbation ([place])   Community acquired pneumonia   Lactic acidosis   Sepsis ([place])  #1. Acute on chronic respiratory failure with hypoxia and hypercapnia, get ABGs, initiate patient on BiPAP if needed, continue oxygen therapy to keep pulse oximeter at around 88-94% #2. COPD exacerbation, initiate steroids, nebulizing therapy, inhalers, follow clinically #3. Pneumonia, get sputum cultures if possible, start Rocephin and Zithromax intravenously #4. Lactic acidosis, hold Lasix, IV fluids, follow lactic acid level #5. Sepsis due to above, blood cultures, sputum cultures, adjust antibiotics according to culture results, continue broad-spectrum antibiotic therapy #6. Metabolic encephalopathy, get ABGs to rule out CO2 retention   All the records are reviewed and case discussed with ED provider. Management plans discussed with the patient, family and they are in agreement.  CODE STATUS: Code Status History    Date Active Date Inactive Code Status Order ID Comments User Context   08/11/2016  3:24 AM 08/11/2016  9:37 AM Full Code [id number]  Lance Coon, MD Inpatient   03/29/2015  9:15 PM  03/30/2015  3:55 PM Full Code [id number]  Nicholes Mango, MD Inpatient       TOTAL Critical care TIME TAKING CARE OF THIS PATIENT: 55  minutes.    Theodoro Grist M.D on 12/16/2016 at 9:42 PM  Between 7am to 6pm - Pager - [phone] After 6pm go to [email or web address] - password [email or web address] [facility]  Office  [phone]  CC: Primary care physician; [name], MD

## 2016-12-16 NOTE — ED Provider Notes (Signed)
Time Seen: Approximately1947 I have reviewed the triage notes  Chief Complaint: Cough and Fever   History of Present Illness: Tracy Mcmahon is a 83 y.o. female *who is had a low-grade fever for the last 4-5 days with a dry nonproductive cough. Cough recently started to sound wet. No high fevers at home she denies any chest pain, nausea, vomiting. She denies any hemoptysis or wheezing. Patient was seen by her primary physician was referred here for possible pneumonia.   Past Medical History:  Diagnosis Date  . Cancer ([place])   . Cataract   . Chronic kidney disease (CKD), stage III (moderate)   . Chronic urticaria   . COPD (chronic obstructive pulmonary disease) ([place])   . Depression   . Diabetes mellitus without complication ([place])   . DVT (deep venous thrombosis) (HCC)    left leg  . Hyperlipemia   . Hypertension   . Osteoarthritis   . Osteopenia   . Polymyalgia rheumatica ([place])   . Polyneuropathy ([place])   . Renal cancer ([place])   . Renal insufficiency 08/10/2016   Chart indicates CKD stage 3  . Restless leg syndrome   . Vitamin D deficiency   . Vulvar intraepithelial neoplasia with lichen sclerosus     Patient Active Problem List   Diagnosis Date Noted  . PE (pulmonary embolism) 08/11/2016  . COPD (chronic obstructive pulmonary disease) ([place]) 03/29/2015  . Hypertension 03/29/2015  . Diabetes ([place]) 03/29/2015  . Hyperlipidemia 03/29/2015  . Depression 03/29/2015  . Restless leg syndrome 03/29/2015  . DVT (deep venous thrombosis) ([place]) 03/29/2015  . Chronic kidney disease 03/29/2015    Past Surgical History:  Procedure Laterality Date  . ABDOMINAL HYSTERECTOMY    . APPENDECTOMY    . CATARACT EXTRACTION    . COLONOSCOPY    . JOINT REPLACEMENT Bilateral    hip  . LAMINOTOMY    . NEPHRECTOMY    . TEMPORAL ARTERY BIOPSY / LIGATION    . TONSILLECTOMY      Past Surgical History:  Procedure Laterality Date  . ABDOMINAL HYSTERECTOMY    . APPENDECTOMY    . CATARACT  EXTRACTION    . COLONOSCOPY    . JOINT REPLACEMENT Bilateral    hip  . LAMINOTOMY    . NEPHRECTOMY    . TEMPORAL ARTERY BIOPSY / LIGATION    . TONSILLECTOMY      Current Outpatient Rx  . Order #: [id number] Class: Historical Med  . Order #: [id number] Class: Normal  . Order #: [id number] Class: Historical Med  . Order #: [id number] Class: Historical Med  . Order #: [id number] Class: Normal  . Order #: [id number] Class: Historical Med  . Order #: [id number] Class: Historical Med  . Order #: [id number] Class: Historical Med  . Order #: [id number] Class: Historical Med  . Order #: [id number] Class: Historical Med  . Order #: [id number] Class: Historical Med  . Order #: [id number] Class: Historical Med  . Order #: [id number] Class: Historical Med  . Order #: [id number] Class: Historical Med  . Order #: [id number] Class: Historical Med    Allergies:  Fosamax [alendronate sodium] and Wellbutrin [bupropion]  Family History: Family History  Problem Relation Age of Onset  . Hypertension    . Diabetes      Social History: Social History  Substance Use Topics  . Smoking status: Never Smoker  . Smokeless tobacco: Never Used  . Alcohol use No     Review of Systems:   10 point review of systems was performed and was otherwise negative:  Constitutional:Maximum  temperature at home was 101 Patient was found to be 101.6 at the physician's office Eyes: No visual disturbances ENT: No sore throat, ear pain Cardiac: No chest pain Respiratory: Mild shortness of breath and the patient's old anterior nasal cannula at home as baseline no wheezing or stridor is been noted Abdomen: No abdominal pain, no vomiting, No diarrhea Endocrine: No weight loss, No night sweats Extremities: No peripheral edema, cyanosis Skin: No rashes, easy bruising Neurologic: No focal weakness, trouble with speech or swollowing Urologic: No dysuria, Hematuria, or urinary frequency   Physical Exam:  ED Triage Vitals  Enc Vitals  Group     BP 12/16/16 1625 139/69     Pulse Rate 12/16/16 1625 95     Resp 12/16/16 1625 18     Temp 12/16/16 1625 99.4 F (37.4 C)     Temp Source 12/16/16 1625 Oral     SpO2 12/16/16 1625 91 %     Weight 12/16/16 1627 200 lb (90.7 kg)     Height 12/16/16 1627 5' (1.524 m)     Head Circumference --      Peak Flow --      Pain Score 12/16/16 1628 0     Pain Loc --      Pain Edu? --      Excl. in [place]? --     General: Awake , Alert , and Oriented times 3; GCS 15 No signs of respiratory distress Head: Normal cephalic , atraumatic Eyes: Pupils equal , round, reactive to light Nose/Throat: No nasal drainage, patent upper airway without erythema or exudate.  Neck: Supple, Full range of motion, No anterior adenopathy or palpable thyroid masses Lungs: Coarse breath sounds auscultated bilaterally without any wheezes. Heart: Regular rate, regular rhythm without murmurs , gallops , or rubs Abdomen: Soft, non tender without rebound, guarding , or rigidity; bowel sounds positive and symmetric in all 4 quadrants. No organomegaly .        Extremities: 2 plus symmetric pulses. No edema, clubbing or cyanosis Neurologic: normal ambulation, Motor symmetric without deficits, sensory intact Skin: warm, dry, no rashes   Labs:   All laboratory work was reviewed including any pertinent negatives or positives listed below:  Labs Reviewed  CBC WITH DIFFERENTIAL/PLATELET - Abnormal; Notable for the following:       Result Value   WBC 19.6 (*)    RBC 3.52 (*)    Hemoglobin 9.6 (*)    HCT 30.4 (*)    MCHC 31.6 (*)    RDW 16.8 (*)    Neutro Abs 16.4 (*)    Monocytes Absolute 1.7 (*)    All other components within normal limits  BASIC METABOLIC PANEL - Abnormal; Notable for the following:    Chloride 92 (*)    CO2 33 (*)    Glucose, Bld 280 (*)    BUN 22 (*)    Creatinine, Ser 1.32 (*)    GFR calc non Af Amer 36 (*)    GFR calc Af Amer 41 (*)    All other components within normal limits   CULTURE, BLOOD (ROUTINE X 2)  CULTURE, BLOOD (ROUTINE X 2)  LACTIC ACID, PLASMA  LACTIC ACID, PLASMA  INFLUENZA PANEL BY PCR (TYPE A & B)    Radiology: * "Dg Chest 2 View  Result Date: 12/16/2016 CLINICAL DATA:  Cough, fever EXAM: CHEST  2 VIEW COMPARISON:  08/10/2016 FINDINGS: Area consolidation noted in the right upper lobe as well as both lower lobes  concerning for pneumonia. Small bilateral pleural effusions. Mild cardiomegaly. No acute bony abnormality. IMPRESSION: Multifocal pneumonia. Small bilateral effusions. Electronically Signed   By: Rolm Baptise M.D.   On: 12/16/2016 16:58  "  I personally reviewed the radiologic studies    ED Course:  Patient's stay here was positive for uneventful. She was initiated on treatment for community-acquired pneumonia. Influenza screen is pending. Chest x-ray shows multi focal pneumonia without any focal effusions. Patient blood cultures 2 obtained along with initiation of IV antibiotics including Rocephin.     Assessment: ** Community-acquired pneumonia History of COPD Home oxygen dependent     Plan: * Inpatient           Daymon Larsen, MD 12/16/16 2110

## 2016-12-16 NOTE — ED Triage Notes (Signed)
Seen by Dr. Raechel Ache today for 4-5 day history of cough and fever.  Fever today of 101.6 at office, not treated, and CXR done.  Patient sent to ED for evaluation and possible admission.

## 2016-12-17 LAB — GLUCOSE, CAPILLARY
GLUCOSE-CAPILLARY: 167 mg/dL — AB (ref 65–99)
GLUCOSE-CAPILLARY: 218 mg/dL — AB (ref 65–99)
Glucose-Capillary: 85 mg/dL (ref 65–99)
Glucose-Capillary: 316 mg/dL — ABNORMAL HIGH (ref 65–99)
Glucose-Capillary: 267 mg/dL — ABNORMAL HIGH (ref 65–99)

## 2016-12-17 LAB — BASIC METABOLIC PANEL
Anion gap: 11 (ref 5–15)
BUN: 23 mg/dL — ABNORMAL HIGH (ref 6–20)
CO2: 33 mmol/L — ABNORMAL HIGH (ref 22–32)
Calcium: 8.7 mg/dL — ABNORMAL LOW (ref 8.9–10.3)
Chloride: 94 mmol/L — ABNORMAL LOW (ref 101–111)
Creatinine, Ser: 1.17 mg/dL — ABNORMAL HIGH (ref 0.44–1.00)
GFR, EST AFRICAN AMERICAN: 48 mL/min — AB (ref >60)
GFR, EST NON AFRICAN AMERICAN: 41 mL/min — AB (ref >60)
GLUCOSE: 86 mg/dL (ref 65–99)
POTASSIUM: 3.8 mmol/L (ref 3.5–5.1)
Sodium: 138 mmol/L (ref 135–145)

## 2016-12-17 LAB — CBC
HEMATOCRIT: 27.9 % — AB (ref 35.0–47.0)
HEMOGLOBIN: 9.1 g/dL — AB (ref 12.0–16.0)
MCH: 28.2 pg (ref 26.0–34.0)
MCHC: 32.7 g/dL (ref 32.0–36.0)
MCV: 86.5 fL (ref 80.0–100.0)
Platelets: 332 10*3/uL (ref 150–440)
RBC: 3.22 MIL/uL — ABNORMAL LOW (ref 3.80–5.20)
RDW: 16.9 % — ABNORMAL HIGH (ref 11.5–14.5)
WBC: 17.6 10*3/uL — AB (ref 3.6–11.0)

## 2016-12-17 LAB — TROPONIN I: TROPONIN I: 0.03 ng/mL — AB (ref <0.03)

## 2016-12-17 MED ORDER — INSULIN ASPART 100 UNIT/ML ~~LOC~~ SOLN
0.0000 [IU] | Freq: Three times a day (TID) | SUBCUTANEOUS | Status: DC
  Administered 2016-12-17: 7 [IU] via SUBCUTANEOUS
  Administered 2016-12-17: 4 [IU] via SUBCUTANEOUS
  Administered 2016-12-17: 15 [IU] via SUBCUTANEOUS
  Administered 2016-12-18: 11 [IU] via SUBCUTANEOUS
  Administered 2016-12-18: 20 [IU] via SUBCUTANEOUS
  Administered 2016-12-18: 7 [IU] via SUBCUTANEOUS
  Administered 2016-12-19: 20 [IU] via SUBCUTANEOUS
  Administered 2016-12-19: 4 [IU] via SUBCUTANEOUS
  Filled 2016-12-17 (×2): qty 7
  Filled 2016-12-17: qty 20
  Filled 2016-12-17 (×2): qty 4
  Filled 2016-12-17: qty 15
  Filled 2016-12-17: qty 11

## 2016-12-17 MED ORDER — ONDANSETRON HCL 4 MG PO TABS: 4.0000 mg | ORAL_TABLET | Freq: Four times a day (QID) | ORAL | Status: DC | PRN

## 2016-12-17 MED ORDER — ALBUTEROL SULFATE (2.5 MG/3ML) 0.083% IN NEBU
2.5000 mg | INHALATION_SOLUTION | RESPIRATORY_TRACT | Status: DC
  Administered 2016-12-17 – 2016-12-18 (×7): 2.5 mg via RESPIRATORY_TRACT
  Filled 2016-12-17 (×9): qty 3

## 2016-12-17 MED ORDER — BUDESONIDE 0.25 MG/2ML IN SUSP
0.2500 mg | Freq: Two times a day (BID) | RESPIRATORY_TRACT | Status: DC
  Administered 2016-12-17 – 2016-12-19 (×4): 0.25 mg via RESPIRATORY_TRACT
  Filled 2016-12-17 (×4): qty 2

## 2016-12-17 MED ORDER — ATENOLOL 25 MG PO TABS
25.0000 mg | ORAL_TABLET | Freq: Two times a day (BID) | ORAL | Status: DC
  Administered 2016-12-17 – 2016-12-19 (×6): 25 mg via ORAL
  Filled 2016-12-17 (×6): qty 1

## 2016-12-17 MED ORDER — ACETAMINOPHEN 650 MG RE SUPP: 650.0000 mg | Freq: Four times a day (QID) | RECTAL | Status: DC | PRN

## 2016-12-17 MED ORDER — CEFTRIAXONE SODIUM-DEXTROSE 1-3.74 GM-% IV SOLR
1.0000 g | INTRAVENOUS | Status: DC
  Administered 2016-12-17 – 2016-12-19 (×3): 1 g via INTRAVENOUS
  Filled 2016-12-17 (×3): qty 50

## 2016-12-17 MED ORDER — INSULIN GLARGINE 100 UNIT/ML ~~LOC~~ SOLN
80.0000 [IU] | Freq: Every day | SUBCUTANEOUS | Status: DC
  Administered 2016-12-17 – 2016-12-19 (×3): 80 [IU] via SUBCUTANEOUS
  Filled 2016-12-17 (×3): qty 0.8

## 2016-12-17 MED ORDER — TIOTROPIUM BROMIDE MONOHYDRATE 18 MCG IN CAPS
18.0000 ug | ORAL_CAPSULE | Freq: Every day | RESPIRATORY_TRACT | Status: DC
  Administered 2016-12-17 – 2016-12-19 (×3): 18 ug via RESPIRATORY_TRACT
  Filled 2016-12-17: qty 5

## 2016-12-17 MED ORDER — INSULIN ASPART 100 UNIT/ML ~~LOC~~ SOLN
0.0000 [IU] | Freq: Every day | SUBCUTANEOUS | Status: DC
  Administered 2016-12-17: 3 [IU] via SUBCUTANEOUS
  Administered 2016-12-18: 2 [IU] via SUBCUTANEOUS
  Filled 2016-12-17: qty 3
  Filled 2016-12-17: qty 2

## 2016-12-17 MED ORDER — PNEUMOCOCCAL VAC POLYVALENT 25 MCG/0.5ML IJ INJ
0.5000 mL | INJECTION | INTRAMUSCULAR | Status: DC
  Filled 2016-12-17: qty 0.5

## 2016-12-17 MED ORDER — PRAVASTATIN SODIUM 20 MG PO TABS
20.0000 mg | ORAL_TABLET | Freq: Every day | ORAL | Status: DC
  Administered 2016-12-17 – 2016-12-18 (×2): 20 mg via ORAL
  Filled 2016-12-17 (×2): qty 1

## 2016-12-17 MED ORDER — INSULIN ASPART 100 UNIT/ML ~~LOC~~ SOLN
6.0000 [IU] | Freq: Three times a day (TID) | SUBCUTANEOUS | Status: DC
  Administered 2016-12-17 – 2016-12-19 (×8): 6 [IU] via SUBCUTANEOUS
  Filled 2016-12-17 (×7): qty 6

## 2016-12-17 MED ORDER — DEXTROSE 5 % IV SOLN
500.0000 mg | INTRAVENOUS | Status: DC
  Administered 2016-12-17 – 2016-12-19 (×3): 500 mg via INTRAVENOUS
  Filled 2016-12-17 (×4): qty 500

## 2016-12-17 MED ORDER — ACETAMINOPHEN 325 MG PO TABS: 650.0000 mg | ORAL_TABLET | Freq: Four times a day (QID) | ORAL | Status: DC | PRN

## 2016-12-17 MED ORDER — DEXTROSE 5 % IV SOLN: 1.0000 g | INTRAVENOUS | Status: DC

## 2016-12-17 MED ORDER — APIXABAN 5 MG PO TABS
5.0000 mg | ORAL_TABLET | Freq: Two times a day (BID) | ORAL | Status: DC
  Administered 2016-12-17 – 2016-12-19 (×6): 5 mg via ORAL
  Filled 2016-12-17 (×6): qty 1

## 2016-12-17 MED ORDER — VITAMIN B-12 1000 MCG PO TABS
1000.0000 ug | ORAL_TABLET | Freq: Every day | ORAL | Status: DC
  Administered 2016-12-17 – 2016-12-19 (×3): 1000 ug via ORAL
  Filled 2016-12-17 (×3): qty 1

## 2016-12-17 MED ORDER — METHYLPREDNISOLONE SODIUM SUCC 125 MG IJ SOLR
60.0000 mg | INTRAMUSCULAR | Status: DC
Start: 2016-12-17 — End: 2016-12-19
  Administered 2016-12-17 – 2016-12-19 (×3): 60 mg via INTRAVENOUS
  Filled 2016-12-17 (×3): qty 2

## 2016-12-17 MED ORDER — ONDANSETRON HCL 4 MG/2ML IJ SOLN: 4.0000 mg | Freq: Four times a day (QID) | INTRAMUSCULAR | Status: DC | PRN

## 2016-12-17 MED ORDER — FLUOXETINE HCL 20 MG PO CAPS
20.0000 mg | ORAL_CAPSULE | Freq: Every day | ORAL | Status: DC
  Administered 2016-12-17 – 2016-12-19 (×3): 20 mg via ORAL
  Filled 2016-12-17 (×3): qty 1

## 2016-12-17 MED ORDER — SODIUM CHLORIDE 0.9 % IV SOLN
INTRAVENOUS | Status: DC
  Administered 2016-12-17: 02:00:00 via INTRAVENOUS

## 2016-12-17 MED ORDER — ASPIRIN EC 81 MG PO TBEC
81.0000 mg | DELAYED_RELEASE_TABLET | Freq: Every day | ORAL | Status: DC
  Administered 2016-12-17 – 2016-12-19 (×3): 81 mg via ORAL
  Filled 2016-12-17 (×3): qty 1

## 2016-12-17 MED ORDER — GABAPENTIN 300 MG PO CAPS
300.0000 mg | ORAL_CAPSULE | Freq: Three times a day (TID) | ORAL | Status: DC
  Administered 2016-12-17 – 2016-12-19 (×7): 300 mg via ORAL
  Filled 2016-12-17 (×7): qty 1

## 2016-12-17 MED ORDER — SODIUM CHLORIDE 0.9% FLUSH
3.0000 mL | Freq: Two times a day (BID) | INTRAVENOUS | Status: DC
  Administered 2016-12-17 – 2016-12-19 (×5): 3 mL via INTRAVENOUS

## 2016-12-17 MED ORDER — GUAIFENESIN ER 600 MG PO TB12
600.0000 mg | ORAL_TABLET | Freq: Two times a day (BID) | ORAL | Status: DC
  Administered 2016-12-17 – 2016-12-19 (×6): 600 mg via ORAL
  Filled 2016-12-17 (×6): qty 1

## 2016-12-17 NOTE — Progress Notes (Signed)
Initial Nutrition Assessment  DOCUMENTATION CODES:   Obesity unspecified  INTERVENTION:  -Patient likely does not need ONS at this time. Monitor for PO intake.  NUTRITION DIAGNOSIS:   Inadequate oral intake related to poor appetite as evidenced by per patient/family report.  GOAL:   Patient will meet greater than or equal to 90% of their needs  MONITOR:   PO intake, I & O's, Labs, Weight trends, Supplement acceptance  REASON FOR ASSESSMENT:   Malnutrition Screening Tool    ASSESSMENT:   Tracy Mcmahon  is a 83 y.o. female with a known history of Multiple medical problems including chronic respiratory failure, on 2 L of oxygen to home. 24 7, COPD, CAD stage III, 1 kidney, diabetes, hypertension, hyperlipidemia, renal cancer, recent diagnosis of left lower extremity DVT and pulmonary embolism  Spoke with one of patient's daughters at bedside. Apparently patient lives with other daughter, history limited. Patient consumes "whatever she wants" at home, as long as she takes enough insulin to cover it. Weight is down an insignificant 11#/5.5% over 5 months. Daughter states patient ate some grits this morning - but patient's sleep patterns are from 4am-1130am/12pm - due to the shift the daughter she lives with works. So that may impact her PO intake of breakfast on her schedule here. She is not very active. L LE is also very swollen from recent DVT, R LE swollen but not as bad.  Nutrition-Focused physical exam completed. Findings are no fat depletion, no muscle depletion, and moderate edema.   Labs and medications reviewed: CBGs: 167-316 Solumedrol, B12  Diet Order:  Diet heart healthy/carb modified Room service appropriate? Yes; Fluid consistency: Thin  Skin:  Reviewed, no issues  Last BM:  12/16/2016  Height:   Ht Readings from Last 1 Encounters:  12/16/16 5' (1.524 m)    Weight:   Wt Readings from Last 1 Encounters:  12/17/16 183 lb 9.6 oz (83.3 kg)    Ideal Body  Weight:  45.45 kg  BMI:  Body mass index is 35.86 kg/m.  Estimated Nutritional Needs:   Kcal:  1450-1550 calories (MSJ x1.2-1.3)  Protein:  83-100 gm  Fluid:  >/= 1.5L  EDUCATION NEEDS:   No education needs identified at this time  Satira Anis. Gilberto Streck, MS, RD LDN Inpatient Clinical Dietitian Pager [phone]

## 2016-12-17 NOTE — ED Notes (Signed)
[name] - [phone]; daughter's number to call with any changes.

## 2016-12-17 NOTE — Care Management (Signed)
Patient was recently at [facility] in Sept. 2017 and discharged home with  daughter [name] [phone]. At baseline patient ambulates with a walker. RNCM will continue to follow. O2 chronic [facility]. No home health services at this time. Per daughter she plans to bring her home and would like to use [facility].

## 2016-12-17 NOTE — Progress Notes (Addendum)
Patient ID: Tracy Mcmahon, female   DOB: 09-19-1929, 83 y.o.   MRN: [id number]  [facility] PROGRESS NOTE  Tracy Mcmahon [id number]:[id number] DOB: Dec 28, 1928 DOA: 12/16/2016 PCP: [name], MD  HPI/Subjective: Patient feels okay. A little bit short of breath and a little cough which is dry. She was seen by her physician yesterday and sent over to the emergency room for pneumonia.  Objective: Vitals:   12/17/16 0751 12/17/16 1512  BP: (!) 145/63 (!) 124/55  Pulse: 81 65  Resp:  16  Temp: 98.9 F (37.2 C) 98.3 F (36.8 C)    Filed Weights   12/16/16 1627 12/17/16 0156  Weight: 90.7 kg (200 lb) 83.3 kg (183 lb 9.6 oz)    ROS: Review of Systems  Constitutional: Negative for chills and fever.  Eyes: Negative for blurred vision.  Respiratory: Positive for cough, shortness of breath and wheezing.   Cardiovascular: Negative for chest pain.  Gastrointestinal: Negative for abdominal pain, constipation, diarrhea, nausea and vomiting.  Genitourinary: Negative for dysuria.  Musculoskeletal: Negative for joint pain.  Neurological: Negative for dizziness and headaches.   Exam: Physical Exam  Constitutional: She is oriented to person, place, and time.  HENT:  Nose: No mucosal edema.  Mouth/Throat: No oropharyngeal exudate or posterior oropharyngeal edema.  Eyes: Conjunctivae, EOM and lids are normal. Pupils are equal, round, and reactive to light.  Neck: No JVD present. Carotid bruit is not present. No edema present. No thyroid mass and no thyromegaly present.  Cardiovascular: S1 normal and S2 normal.  Exam reveals no gallop.   No murmur heard. Pulses:      Dorsalis pedis pulses are 2+ on the right side, and 2+ on the left side.  Respiratory: No respiratory distress. She has decreased breath sounds in the right upper field, the right middle field, the right lower field, the left upper field, the left middle field and the left lower field. She has wheezes in the right upper field, the  right middle field, the left upper field and the left middle field. She has no rhonchi. She has no rales.  GI: Soft. Bowel sounds are normal. There is no tenderness.  Musculoskeletal:       Right ankle: She exhibits swelling.       Left ankle: She exhibits swelling.  Lymphadenopathy:    She has no cervical adenopathy.  Neurological: She is alert and oriented to person, place, and time. No cranial nerve deficit.  Skin: Skin is warm. No rash noted. Nails show no clubbing.  Psychiatric: She has a normal mood and affect.      Data Reviewed: Basic Metabolic Panel:  Recent Labs Lab 12/16/16 1628 12/17/16 0405  NA 135 138  K 4.6 3.8  CL 92* 94*  CO2 33* 33*  GLUCOSE 280* 86  BUN 22* 23*  CREATININE 1.32* 1.17*  CALCIUM 8.9 8.7*   CBC:  Recent Labs Lab 12/16/16 1628 12/17/16 0405  WBC 19.6* 17.6*  NEUTROABS 16.4*  --   HGB 9.6* 9.1*  HCT 30.4* 27.9*  MCV 86.4 86.5  PLT 334 332   Cardiac Enzymes:  Recent Labs Lab 12/17/16 0405 12/17/16 1322  TROPONINI 0.03* <0.03   BNP (last 3 results)  Recent Labs  08/10/16 1634  BNP 45.0     CBG:  Recent Labs Lab 12/17/16 0131 12/17/16 0748 12/17/16 1117  GLUCAP 85 167* 316*    Recent Results (from the past 240 hour(s))  Culture, blood (Routine X 2) w Reflex to  ID Panel     Status: None (Preliminary result)   Collection Time: 12/16/16  8:27 PM  Result Value Ref Range Status   Specimen Description BLOOD RIGHT ARM  Final   Special Requests   Final    BOTTLES DRAWN AEROBIC AND ANAEROBIC AER 7ML ANA 11ML   Culture NO GROWTH < 24 HOURS  Final   Report Status PENDING  Incomplete  Culture, blood (Routine X 2) w Reflex to ID Panel     Status: None (Preliminary result)   Collection Time: 12/16/16  8:27 PM  Result Value Ref Range Status   Specimen Description BLOOD LEFT FA  Final   Special Requests   Final    BOTTLES DRAWN AEROBIC AND ANAEROBIC AER 5ML ANA 8ML   Culture NO GROWTH < 24 HOURS  Final   Report Status  PENDING  Incomplete     Studies: Dg Chest 2 View  Result Date: 12/16/2016 CLINICAL DATA:  Cough, fever EXAM: CHEST  2 VIEW COMPARISON:  08/10/2016 FINDINGS: Area consolidation noted in the right upper lobe as well as both lower lobes concerning for pneumonia. Small bilateral pleural effusions. Mild cardiomegaly. No acute bony abnormality. IMPRESSION: Multifocal pneumonia. Small bilateral effusions. Electronically Signed   By: Rolm Baptise M.D.   On: 12/16/2016 16:58    Scheduled Meds: . albuterol  2.5 mg Nebulization Q4H  . apixaban  5 mg Oral BID  . aspirin EC  81 mg Oral Daily  . atenolol  25 mg Oral BID  . azithromycin  500 mg Intravenous Q24H  . budesonide (PULMICORT) nebulizer solution  0.25 mg Nebulization BID  . cefTRIAXone  1 g Intravenous Q24H  . FLUoxetine  20 mg Oral Daily  . gabapentin  300-600 mg Oral TID  . guaiFENesin  600 mg Oral BID  . insulin aspart  0-20 Units Subcutaneous TID WC  . insulin aspart  0-5 Units Subcutaneous QHS  . insulin aspart  6 Units Subcutaneous TID WC  . insulin glargine  80 Units Subcutaneous Daily  . methylPREDNISolone (SOLU-MEDROL) injection  60 mg Intravenous Q24H  . [START ON 12/18/2016] pneumococcal 23 valent vaccine  0.5 mL Intramuscular Tomorrow-1000  . pravastatin  20 mg Oral Daily  . sodium chloride flush  3 mL Intravenous Q12H  . tiotropium  18 mcg Inhalation Daily  . vitamin B-12  1,000 mcg Oral Daily    Assessment/Plan:  1. Clinical sepsis, lactic acidosis and bilateral pneumonia. Continue Rocephin and Zithromax. Follow-up cultures. 2. Acute on chronic hypoxic and hypercarbic respiratory failure. Continue oxygen supplementation 2 L nasal cannula continuous. 3. COPD exacerbation. Continue Solu-Medrol 60 mg IV daily add budesonide nebulizers and continue DuoNeb nebulizer 4. Acute encephalopathy secondary to sepsis. This seems improved. 5. History of DVT on Eliquis 6. Essential hypertension on atenolol 7. Hyperlipidemia  unspecified on pravastatin 8. Impression on fluoxetine 9. History of polymyalgia rheumatica 10. Weakness. Physical therapy evaluation 11. Chronic anemia continue to follow 12. Chronic kidney disease stage III continue to monitor while here in the hospital 13. Type 2 diabetes mellitus. Watch closely on large dose of Lantus 80 units given this morning.  Code Status:     Code Status Orders        Start     Ordered   12/17/16 0126  Full code  Continuous     12/17/16 0125    Code Status History    Date Active Date Inactive Code Status Order ID Comments User Context   08/11/2016  3:24 AM  08/11/2016  9:37 AM Full Code [id number]  Lance Coon, MD Inpatient   03/29/2015  9:15 PM 03/30/2015  3:55 PM Full Code [id number]  Nicholes Mango, MD Inpatient    Advance Directive Documentation   [place] Most Recent Value  Type of Advance Directive  Living will  Pre-existing out of facility DNR order (yellow form or pink MOST form)  No data  "MOST" Form in Place?  No data     Family Communication: Family at bedside Disposition Plan: Needs to breathe better prior to disposition  Antibiotics:  Rocephin  Zithromax  Time spent: 28 minutes  [place], [place]

## 2016-12-17 NOTE — Progress Notes (Signed)
Shift assessment completed at 0815. Pt drowsy but easily roused, o2 on at [facility], lungs are decreased throughout, pt has infrequent dry cough. Pt respirations do not appear labored, but pt acknowledged feeling sob. Telebox intact, Sr noted. Abdomen is soft, bs heard. Pt is wearing incontinence brief, ppp, no edema noted. PIV #20 intact to l arm, ns infusing, site free of redness and swelling. Since assessment, multiple family members have been in to visit, pt has been assisted by family with meals, pt remains alert and oriented, stating she feels better. Dr. Earleen Newport rounded on pt at approx 1215 and answered family member's questions. IVF dc'd per md order. At this time, pt remains resting in bed, in no distress. Call bell in reach.

## 2016-12-17 NOTE — Plan of Care (Signed)
Problem: Activity: Goal: Risk for activity intolerance will decrease Outcome: Progressing Pt is progressing toward goals.

## 2016-12-18 LAB — GLUCOSE, CAPILLARY
GLUCOSE-CAPILLARY: 398 mg/dL — AB (ref 65–99)
Glucose-Capillary: 272 mg/dL — ABNORMAL HIGH (ref 65–99)
Glucose-Capillary: 231 mg/dL — ABNORMAL HIGH (ref 65–99)
Glucose-Capillary: 237 mg/dL — ABNORMAL HIGH (ref 65–99)

## 2016-12-18 LAB — HEMOGLOBIN A1C
Hgb A1c MFr Bld: 8.3 % — ABNORMAL HIGH (ref 4.8–5.6)
MEAN PLASMA GLUCOSE: 192 mg/dL

## 2016-12-18 MED ORDER — ALBUTEROL SULFATE (2.5 MG/3ML) 0.083% IN NEBU
2.5000 mg | INHALATION_SOLUTION | Freq: Three times a day (TID) | RESPIRATORY_TRACT | Status: DC
Start: 2016-12-19 — End: 2016-12-19
  Administered 2016-12-19 (×2): 2.5 mg via RESPIRATORY_TRACT
  Filled 2016-12-18 (×2): qty 3

## 2016-12-18 MED ORDER — ALBUTEROL SULFATE (2.5 MG/3ML) 0.083% IN NEBU
2.5000 mg | INHALATION_SOLUTION | Freq: Four times a day (QID) | RESPIRATORY_TRACT | Status: DC
  Administered 2016-12-18 (×2): 2.5 mg via RESPIRATORY_TRACT
  Filled 2016-12-18 (×2): qty 3

## 2016-12-18 NOTE — Evaluation (Signed)
Physical Therapy Evaluation Patient Details [name] MRN: [id number] DOB: September 28, 1929 Today's Date: 12/18/2016   History of Present Illness  Pt admitted for acute on chronic respiratory failure with hypoxia. Pt with history of CRF, COPD on 2L of home O2, DM, HTN, L LE DVT and PE.   Clinical Impression  Pt is a pleasant 83 year old female who was admitted for acute on chronic respiratory failure with hypoxia. Pt performs bed mobility with min assist, transfers with cga, and ambulation with cga and RW. Pt demonstrates deficits with strength/mobility/endurance. Pt with limited baseline functioning, very close to baseline level. All mobility performed on 2L of O2 with sats between 91-95%. Would benefit from skilled PT to address above deficits and promote optimal return to PLOF. Recommend transition to [place] upon discharge from acute hospitalization. Patient and family also interested in OT consult for UE strengthening.       Follow Up Recommendations Home health PT;Supervision for mobility/OOB    Equipment Recommendations       Recommendations for Other Services       Precautions / Restrictions Precautions Precautions: Fall Restrictions Weight Bearing Restrictions: No      Mobility  Bed Mobility Overal bed mobility: Needs Assistance Bed Mobility: Supine to Sit     Supine to sit: Min assist     General bed mobility comments: assist for sliding B LE off bed. Cues for bringing upper body to midline. Once seated, able to scoot towards EOB with cga.   Transfers Overall transfer level: Needs assistance Equipment used: Rolling walker (2 wheeled) Transfers: Sit to/from Stand Sit to Stand: Min guard         General transfer comment: Cues for pushing from seated position. Once standing, pt able to demonstrate upright posture. All mobility performed on 2L of O2.  Ambulation/Gait Ambulation/Gait assistance: Min guard Ambulation Distance (Feet): 3 Feet Assistive device:  Rolling walker (2 wheeled) Gait Pattern/deviations: Step-to pattern     General Gait Details: ambulated using RW with step to gait pattern to recliner.   Stairs            Wheelchair Mobility    Modified Rankin (Stroke Patients Only)       Balance Overall balance assessment: Needs assistance Sitting-balance support: Feet supported Sitting balance-Leahy Scale: Good     Standing balance support: Bilateral upper extremity supported Standing balance-Leahy Scale: Good                               Pertinent Vitals/Pain Pain Assessment: No/denies pain    Home Living Family/patient expects to be discharged to:: Private residence Living Arrangements: Children Available Help at Discharge: Family Type of Home: House Home Access: Stairs to enter Entrance Stairs-Rails: None Entrance Stairs-Number of Steps: 3 platform steps  Home Layout: One level Home Equipment: Environmental consultant - 2 wheels      Prior Function Level of Independence: Needs assistance   Gait / Transfers Assistance Needed: Pt. sleeps in lift chair, does not perform bed mobility. assist from family for ADL's      Comments: limited household mobility, ambulates from chair to bathroom     Hand Dominance        Extremity/Trunk Assessment   Upper Extremity Assessment Upper Extremity Assessment: Generalized weakness (B UE grossly 3+/5)    Lower Extremity Assessment Lower Extremity Assessment: Generalized weakness (B LE grossly 4/5)       Communication   Communication: No  difficulties  Cognition Arousal/Alertness: Awake/alert Behavior During Therapy: WFL for tasks assessed/performed Overall Cognitive Status: History of cognitive impairments - at baseline                      General Comments      Exercises Other Exercises Other Exercises: Seated therex performed including B LE ankle pumps, hip add squeezes, hip abd/add, and SLRs. All ther-ex performed x 10 reps with cga. Increased  effort needed for L LE.   Assessment/Plan    PT Assessment Patient needs continued PT services  PT Problem List Decreased strength;Decreased activity tolerance;Decreased balance;Decreased mobility          PT Treatment Interventions Gait training;DME instruction;Therapeutic exercise    PT Goals (Current goals can be found in the Care Plan section)  Acute Rehab PT Goals Patient Stated Goal: to get stronger PT Goal Formulation: With patient Time For Goal Achievement: 01/01/17 Potential to Achieve Goals: Good    Frequency Min 2X/week   Barriers to discharge        Co-evaluation               End of Session Equipment Utilized During Treatment: Gait belt;Oxygen Activity Tolerance: Patient tolerated treatment well Patient left: in chair;with chair alarm set;with family/visitor present Nurse Communication: Mobility status         Time: 8592-7639 PT Time Calculation (min) (ACUTE ONLY): 32 min   Charges:   PT Evaluation $PT Eval Moderate Complexity: 1 Procedure PT Treatments $Therapeutic Exercise: 8-22 mins   PT G Codes:        Sherlonda Flater 01/12/17, 5:25 PM  Greggory Stallion, PT, DPT [phone]

## 2016-12-18 NOTE — Plan of Care (Signed)
Problem: Safety: Goal: Ability to remain free from injury will improve Outcome: Progressing Free from falls during the night  Problem: Pain Managment: Goal: General experience of comfort will improve Outcome: Completed/Met Date Met: 12/18/16 No complaints of pain during the night.

## 2016-12-18 NOTE — Progress Notes (Signed)
Patient ID: Tracy Mcmahon, female   DOB: 1929/11/24, 83 y.o.   MRN: [id number]  [facility] PROGRESS NOTE  Tracy Mcmahon YHC:[id number] DOB: [birth year] DOA: 12/16/2016 PCP: [name], MD  HPI/Subjective: Patient feels better overall, alert, smiling, communicating. A little bit short of breath and a little cough which is dry. She was seen by her physician yesterday and sent over to the emergency room for pneumonia.  Objective: Vitals:   12/18/16 1143 12/18/16 1525  BP: (!) 131/47 (!) 142/52  Pulse: 68 71  Resp: 17 20  Temp: 98.2 F (36.8 C) 98.3 F (36.8 C)    Filed Weights   12/16/16 1627 12/17/16 0156  Weight: 90.7 kg (200 lb) 83.3 kg (183 lb 9.6 oz)    ROS: Review of Systems  Constitutional: Negative for chills and fever.  Eyes: Negative for blurred vision.  Respiratory: Positive for cough, shortness of breath and wheezing.   Cardiovascular: Negative for chest pain.  Gastrointestinal: Negative for abdominal pain, constipation, diarrhea, nausea and vomiting.  Genitourinary: Negative for dysuria.  Musculoskeletal: Negative for joint pain.  Neurological: Negative for dizziness and headaches.   Exam: Physical Exam  Constitutional: She is oriented to person, place, and time.  HENT:  Nose: No mucosal edema.  Mouth/Throat: No oropharyngeal exudate or posterior oropharyngeal edema.  Eyes: Conjunctivae, EOM and lids are normal. Pupils are equal, round, and reactive to light.  Neck: No JVD present. Carotid bruit is not present. No edema present. No thyroid mass and no thyromegaly present.  Cardiovascular: S1 normal and S2 normal.  Exam reveals no gallop.   No murmur heard. Pulses:      Dorsalis pedis pulses are 2+ on the right side, and 2+ on the left side.  Respiratory: No respiratory distress. She has decreased breath sounds in the right upper field, the right middle field, the right lower field, the left upper field, the left middle field and the left lower field. She  has wheezes in the right upper field, the right middle field, the left upper field and the left middle field. She has no rhonchi. She has no rales.  GI: Soft. Bowel sounds are normal. There is no tenderness.  Musculoskeletal:       Right ankle: She exhibits swelling.       Left ankle: She exhibits swelling.  Lymphadenopathy:    She has no cervical adenopathy.  Neurological: She is alert and oriented to person, place, and time. No cranial nerve deficit.  Skin: Skin is warm. No rash noted. Nails show no clubbing.  Psychiatric: She has a normal mood and affect.      Data Reviewed: Basic Metabolic Panel:  Recent Labs Lab 12/16/16 1628 12/17/16 0405  NA 135 138  K 4.6 3.8  CL 92* 94*  CO2 33* 33*  GLUCOSE 280* 86  BUN 22* 23*  CREATININE 1.32* 1.17*  CALCIUM 8.9 8.7*   CBC:  Recent Labs Lab 12/16/16 1628 12/17/16 0405  WBC 19.6* 17.6*  NEUTROABS 16.4*  --   HGB 9.6* 9.1*  HCT 30.4* 27.9*  MCV 86.4 86.5  PLT 334 332   Cardiac Enzymes:  Recent Labs Lab 12/17/16 0405 12/17/16 1322  TROPONINI 0.03* <0.03   BNP (last 3 results)  Recent Labs  08/10/16 1634  BNP 45.0     CBG:  Recent Labs Lab 12/17/16 1624 12/17/16 2127 12/18/16 0741 12/18/16 1137 12/18/16 1715  GLUCAP 218* 267* 272* 398* 231*    Recent Results (from the past 240  hour(s))  Culture, blood (Routine X 2) w Reflex to ID Panel     Status: None (Preliminary result)   Collection Time: 12/16/16  8:27 PM  Result Value Ref Range Status   Specimen Description BLOOD RIGHT ARM  Final   Special Requests   Final    BOTTLES DRAWN AEROBIC AND ANAEROBIC AER 7ML ANA 11ML   Culture NO GROWTH 2 DAYS  Final   Report Status PENDING  Incomplete  Culture, blood (Routine X 2) w Reflex to ID Panel     Status: None (Preliminary result)   Collection Time: 12/16/16  8:27 PM  Result Value Ref Range Status   Specimen Description BLOOD LEFT FA  Final   Special Requests   Final    BOTTLES DRAWN AEROBIC AND  ANAEROBIC AER 5ML ANA 8ML   Culture NO GROWTH 2 DAYS  Final   Report Status PENDING  Incomplete     Studies: No results found.  Scheduled Meds: . albuterol  2.5 mg Nebulization Q6H  . apixaban  5 mg Oral BID  . aspirin EC  81 mg Oral Daily  . atenolol  25 mg Oral BID  . azithromycin  500 mg Intravenous Q24H  . budesonide (PULMICORT) nebulizer solution  0.25 mg Nebulization BID  . cefTRIAXone  1 g Intravenous Q24H  . FLUoxetine  20 mg Oral Daily  . gabapentin  300-600 mg Oral TID  . guaiFENesin  600 mg Oral BID  . insulin aspart  0-20 Units Subcutaneous TID WC  . insulin aspart  0-5 Units Subcutaneous QHS  . insulin aspart  6 Units Subcutaneous TID WC  . insulin glargine  80 Units Subcutaneous Daily  . methylPREDNISolone (SOLU-MEDROL) injection  60 mg Intravenous Q24H  . pneumococcal 23 valent vaccine  0.5 mL Intramuscular Tomorrow-1000  . pravastatin  20 mg Oral Daily  . sodium chloride flush  3 mL Intravenous Q12H  . tiotropium  18 mcg Inhalation Daily  . vitamin B-12  1,000 mcg Oral Daily    Assessment/Plan:  1. Clinical sepsis, lactic acidosis and bilateral pneumonia. Continue Rocephin and Zithromax. Blood cultures are negative so far, sputum culture is pending. 2. Acute on chronic hypoxic and hypercarbic respiratory failure. Continue oxygen supplementation 2.5 L nasal cannula , O2 sat is good at 98%. 3. COPD exacerbation. Continue Solu-Medrol 60 mg IV daily , budesonide and DuoNeb nebulizers every improved clinically 4. Acute metabolic encephalopathy secondary to sepsis,  improved. 5. History of DVT on Eliquis 6. Essential hypertension on atenolol advance medications as needed 7. Hyperlipidemia unspecified on pravastatin 8. Depression, fluoxetine 9. History of polymyalgia rheumatica 10. Weakness. Physical therapy evaluation is completed, home health services are recommended upon discharge 11. Chronic anemia continue to follow 12. Chronic kidney disease stage III  continue to monitor while here in the hospital 13. Type 2 diabetes mellitus. Watch closely on large dose of Lantus 80 units given this morning. Blood glucose is ranging between 85-400  Code Status:     Code Status Orders        Start     Ordered   12/17/16 0126  Full code  Continuous     12/17/16 0125    Code Status History    Date Active Date Inactive Code Status Order ID Comments User Context   08/11/2016  3:24 AM 08/11/2016  9:37 AM Full Code [id number]  Lance Coon, MD Inpatient   03/29/2015  9:15 PM 03/30/2015  3:55 PM Full Code [id number]  Nicholes Mango, MD Inpatient  Advance Directive Documentation   [place] Most Recent Value  Type of Advance Directive  Living will  Pre-existing out of facility DNR order (yellow form or pink MOST form)  No data  "MOST" Form in Place?  No data     Family Communication: Family at bedside Disposition Plan: Needs to breathe better prior to disposition  Antibiotics:  Rocephin  Zithromax  Time spent: 35 minutes  [employer]

## 2016-12-18 NOTE — Progress Notes (Signed)
Inpatient Diabetes Program Recommendations  AACE/ADA: New Consensus Statement on Inpatient Glycemic Control (2015)  Target Ranges:  Prepandial:   less than 140 mg/dL      Peak postprandial:   less than 180 mg/dL (1-2 hours)      Critically ill patients:  140 - 180 mg/dL   Results for DODY, SMARTT (MRN [id number]) as of 12/18/2016 13:30  Ref. Range 12/17/2016 07:48 12/17/2016 11:17 12/17/2016 16:24 12/17/2016 21:27 12/18/2016 07:41 12/18/2016 11:37  Glucose-Capillary Latest Ref Range: 65 - 99 mg/dL 167 (H) 316 (H) 218 (H) 267 (H) 272 (H) 398 (H)    Review of Glycemic Control  Current orders for Inpatient glycemic control: Lantus 80 units daily, Novolog 0-20 units TID with meals, Novolog 0-5 units QHS, Novolog 6 units TID with meals  Inpatient Diabetes Program Recommendations: Insulin - Basal: If steroids are continued as ordered, please consider increasing Lantus to 85 units daily. Insulin - Meal Coverage: If steroids are continued as ordered, please consider increasing meal coverage to Novolog 12 units TID with meals.  Thanks, Barnie Alderman, RN, MSN, CDE Diabetes Coordinator Inpatient Diabetes Program [phone] (Team Pager from 8am to 5pm)

## 2016-12-19 DIAGNOSIS — R531 Weakness: Secondary | ICD-10-CM

## 2016-12-19 DIAGNOSIS — G9340 Encephalopathy, unspecified: Secondary | ICD-10-CM

## 2016-12-19 LAB — BASIC METABOLIC PANEL
Anion gap: 8 (ref 5–15)
BUN: 47 mg/dL — ABNORMAL HIGH (ref 6–20)
CALCIUM: 8.4 mg/dL — AB (ref 8.9–10.3)
CHLORIDE: 97 mmol/L — AB (ref 101–111)
CO2: 33 mmol/L — AB (ref 22–32)
CREATININE: 1.13 mg/dL — AB (ref 0.44–1.00)
GFR calc Af Amer: 50 mL/min — ABNORMAL LOW (ref >60)
GFR calc non Af Amer: 43 mL/min — ABNORMAL LOW (ref >60)
GLUCOSE: 176 mg/dL — AB (ref 65–99)
Potassium: 4.3 mmol/L (ref 3.5–5.1)
Sodium: 138 mmol/L (ref 135–145)

## 2016-12-19 LAB — GLUCOSE, CAPILLARY
Glucose-Capillary: 188 mg/dL — ABNORMAL HIGH (ref 65–99)
Glucose-Capillary: 401 mg/dL — ABNORMAL HIGH (ref 65–99)
Glucose-Capillary: 273 mg/dL — ABNORMAL HIGH (ref 65–99)

## 2016-12-19 LAB — CBC
HEMATOCRIT: 27.3 % — AB (ref 35.0–47.0)
Hemoglobin: 9 g/dL — ABNORMAL LOW (ref 12.0–16.0)
MCH: 28.3 pg (ref 26.0–34.0)
MCHC: 33.1 g/dL (ref 32.0–36.0)
MCV: 85.4 fL (ref 80.0–100.0)
Platelets: 366 10*3/uL (ref 150–440)
RBC: 3.19 MIL/uL — ABNORMAL LOW (ref 3.80–5.20)
RDW: 16.2 % — AB (ref 11.5–14.5)
WBC: 15.4 10*3/uL — ABNORMAL HIGH (ref 3.6–11.0)

## 2016-12-19 MED ORDER — AZITHROMYCIN 500 MG PO TABS: 500.0000 mg | ORAL_TABLET | Freq: Every day | ORAL | Status: DC

## 2016-12-19 MED ORDER — BUDESONIDE 0.25 MG/2ML IN SUSP: 0.2500 mg | Freq: Two times a day (BID) | RESPIRATORY_TRACT | 12 refills | Status: DC

## 2016-12-19 MED ORDER — TIOTROPIUM BROMIDE MONOHYDRATE 18 MCG IN CAPS: 18.0000 ug | ORAL_CAPSULE | Freq: Every day | RESPIRATORY_TRACT | 12 refills | Status: DC

## 2016-12-19 MED ORDER — ALBUTEROL SULFATE (2.5 MG/3ML) 0.083% IN NEBU: 2.5000 mg | INHALATION_SOLUTION | RESPIRATORY_TRACT | 6 refills | Status: DC

## 2016-12-19 MED ORDER — AZITHROMYCIN 500 MG PO TABS: 500.0000 mg | ORAL_TABLET | Freq: Every day | ORAL | 0 refills | Status: DC

## 2016-12-19 MED ORDER — PREDNISONE 10 MG (21) PO TBPK: 10.0000 mg | ORAL_TABLET | Freq: Every day | ORAL | 0 refills | Status: DC

## 2016-12-19 MED ORDER — GUAIFENESIN ER 600 MG PO TB12: 600.0000 mg | ORAL_TABLET | Freq: Two times a day (BID) | ORAL | 0 refills | Status: DC

## 2016-12-19 NOTE — Progress Notes (Signed)
Pts BG 401, MD [place] notifed per MD give scheduled 6 units and scheduled sliding scale 20 units.

## 2016-12-19 NOTE — Progress Notes (Signed)
Pts BP 177/64, MD [place] notified, scheduled atenolol given. No new orders at this time.

## 2016-12-19 NOTE — Progress Notes (Signed)
DISCHARGE NOTE:  Pt given discharge instructions and prescriptions. Pt verbalized understanding. Pt wheeled to car by staff.

## 2016-12-19 NOTE — Care Management Important Message (Signed)
Important Message  Patient Details  [name] MRN: [id number] Date of Birth: 1929-11-05   Medicare Important Message Given:  Yes    Marshell Garfinkel, RN 12/19/2016, 10:59 AM

## 2016-12-19 NOTE — Care Management (Signed)
Will need home health order for PT, RN, and CNA.  [facility] per daughter [name]'s request.

## 2016-12-19 NOTE — Discharge Summary (Signed)
[facility] [name]    MR#:  [id number]  DATE OF BIRTH:  20-Sep-1929  DATE OF ADMISSION:  12/16/2016 ADMITTING PHYSICIAN: Theodoro Grist, MD  DATE OF DISCHARGE: 12/19/2016  3:56 PM  PRIMARY CARE PHYSICIAN: THIES, DAVID, MD     ADMISSION DIAGNOSIS:  Community acquired pneumonia, unspecified laterality [J18.9]  DISCHARGE DIAGNOSIS:  Active Problems:   Acute on chronic respiratory failure with hypoxia and hypercapnia (HCC)   Sepsis (HCC)   COPD exacerbation ([place])   Community acquired pneumonia   Lactic acidosis   Metabolic encephalopathy   Generalized weakness   SECONDARY DIAGNOSIS:   Past Medical History:  Diagnosis Date  . Cancer ([place])   . Cataract   . Chronic kidney disease (CKD), stage III (moderate)   . Chronic urticaria   . COPD (chronic obstructive pulmonary disease) ([place])   . Depression   . Diabetes mellitus without complication ([place])   . DVT (deep venous thrombosis) (HCC)    left leg  . Hyperlipemia   . Hypertension   . Osteoarthritis   . Osteopenia   . Polymyalgia rheumatica ([place])   . Polyneuropathy ([place])   . Renal cancer ([place])   . Renal insufficiency 08/10/2016   Chart indicates CKD stage 3  . Restless leg syndrome   . Vitamin D deficiency   . Vulvar intraepithelial neoplasia with lichen sclerosus     .pro HOSPITAL COURSE:  The patient is a 83 year old Caucasian female with past medical history significant for history of multiple medical problems including depression, diabetes, hyperlipidemia, hypertension, chronic renal insufficiency stage III, restless leg syndrome, who presents to the hospital with complaints of cough going on for the past 1-2 days, fever, weakness, read on arrival to emergency room, she was noted to have pneumonia and hospice services were contacted for admission. She also was noted to have elevated white blood cell count and lactic acid level. Chest x-ray revealed  multifocal pneumonia, small bilateral pleural effusions. Patient was admitted to the hospital for further evaluation and treatment, was initiated on Rocephin and Zithromax, supportive therapy with steroids, inhalation therapy, nebulizers and her condition improved. Her oxygenation improved to 97, 98% on 2 L of oxygen through nasal cannula and her mental status also improved, her appetite was good and she was able to eat about 90% of offered meals. She was felt to be stable to be discharged home. She was evaluated by physical therapist and recommended home health services which are going to be arranged for her upon discharge. Discussion by problem: 1. Sepsis with  lactic acidosis, leukocytosis and chest x-ray evidence of multifocal pneumonia. Continue  Zithromax at home for 4 more days to complete course. Blood cultures are negative , sputum culture is pending at the time of discharge. White blood cell count has improved.  2. Acute on chronic hypoxic and hypercarbic respiratory failure. Continue oxygen supplementation, now on  2 L nasal cannula , O2 sat is good at 98%. 3. COPD exacerbation. Continue  budesonide and DuoNeb nebulizers, Mucinex,  , prednisone taper, patient has improved clinically 4. Acute metabolic encephalopathy secondary to sepsis,  , resolved. 5. History of DVT on Eliquis 6. Essential hypertension on atenolol, patient's blood pressure was elevated while in the hospital, it is recommended to recheck her blood pressure as outpatient and make decisions about advancement of medications if needed 7. Hyperlipidemia unspecified on pravastatin 8. Depression, fluoxetine 9. History of polymyalgia rheumatica 10. Weakness. Physical therapy evaluation  is completed, home health services are recommended upon discharge 11. Chronic anemia continue to follow 12. Chronic kidney disease stage III continue to monitor while here in the hospital, stable 13. Type 2 diabetes mellitus. She has blood glucose  levels were markedly elevated while on steroids in the hospital, she is to taper steroids, continue Lantus at 80 units at home   DISCHARGE CONDITIONS:   Stable  CONSULTS OBTAINED:    DRUG ALLERGIES:   Allergies  Allergen Reactions  . Fosamax [Alendronate Sodium] Other (See Comments)    Reaction:  Made pt "very sick"  . Wellbutrin [Bupropion] Other (See Comments)    Reaction:  Unknown    DISCHARGE MEDICATIONS:   Discharge Medication List as of 12/19/2016  2:10 PM    START taking these medications   Details  albuterol (PROVENTIL) (2.5 MG/3ML) 0.083% nebulizer solution Take 3 mLs (2.5 mg total) by nebulization every 4 (four) hours., Starting Thu 12/19/2016, Normal    azithromycin (ZITHROMAX) 500 MG tablet Take 1 tablet (500 mg total) by mouth daily., Starting Fri 12/20/2016, Normal    budesonide (PULMICORT) 0.25 MG/2ML nebulizer solution Take 2 mLs (0.25 mg total) by nebulization 2 (two) times daily., Starting Thu 12/19/2016, Normal    guaiFENesin (MUCINEX) 600 MG 12 hr tablet Take 1 tablet (600 mg total) by mouth 2 (two) times daily., Starting Thu 12/19/2016, Normal    predniSONE (STERAPRED UNI-PAK 21 TAB) 10 MG (21) TBPK tablet Take 1 tablet (10 mg total) by mouth daily. Please take 6 pills in the morning on the days 1 and 2, then taper by one pill every 2 days until finished, thank you, Starting Thu 12/19/2016, Normal    tiotropium (SPIRIVA) 18 MCG inhalation capsule Place 1 capsule (18 mcg total) into inhaler and inhale daily., Starting Fri 12/20/2016, Normal      CONTINUE these medications which have NOT CHANGED   Details  apixaban (ELIQUIS) 5 MG TABS tablet Take 2 tablets (10 mg total) by mouth 2 (two) times daily. 2 tabs bid for 7 days then 1 tab bid, Starting Tue 08/13/2016, Normal    aspirin EC 81 MG tablet Take 81 mg by mouth daily., Until Discontinued, Historical Med    atenolol (TENORMIN) 25 MG tablet Take 25 mg by mouth 2 (two) times daily., Until Discontinued,  Historical Med    FLUoxetine (PROZAC) 20 MG capsule Take 20 mg by mouth daily., Until Discontinued, Historical Med    furosemide (LASIX) 20 MG tablet Take 20 mg by mouth daily., Historical Med    gabapentin (NEURONTIN) 300 MG capsule Take 300-600 mg by mouth 3 (three) times daily. 1 capsule by mouth in the morning and 2 at bedtime, Historical Med    insulin aspart (NOVOLOG) 100 UNIT/ML injection Inject 0-20 Units into the skin See admin instructions. Pt uses as needed per sliding scale. Twice daily, Historical Med    insulin glargine (LANTUS) 100 UNIT/ML injection Inject 80 Units into the skin daily. 80 units at 1300 given as 40 units in two different sites, Historical Med    Multiple Minerals-Vitamins (GNP CAL MAG ZINC +D3 PO) Take 1 tablet by mouth daily., Historical Med    pravastatin (PRAVACHOL) 20 MG tablet Take 20 mg by mouth daily., Until Discontinued, Historical Med    predniSONE (DELTASONE) 5 MG tablet Take 7.5 mg by mouth daily. , Historical Med    vitamin B-12 (CYANOCOBALAMIN) 1000 MCG tablet Take 1,000 mcg by mouth daily., Until Discontinued, Historical Med    Vitamin D, Ergocalciferol, (  DRISDOL) 50000 UNITS CAPS capsule Take 50,000 Units by mouth every 7 (seven) days. monday, Historical Med    alendronate (FOSAMAX) 35 MG tablet Take 35 mg by mouth every 7 (seven) days. Take with a full glass of water on an empty stomach., Historical Med      STOP taking these medications     cephALEXin (KEFLEX) 500 MG capsule          DISCHARGE INSTRUCTIONS:    The patient is to follow-up with primary care physician within one week after discharge  If you experience worsening of your admission symptoms, develop shortness of breath, life threatening emergency, suicidal or homicidal thoughts you must seek medical attention immediately by calling 911 or calling your MD immediately  if symptoms less severe.  You Must read complete instructions/literature along with all the possible  adverse reactions/side effects for all the Medicines you take and that have been prescribed to you. Take any new Medicines after you have completely understood and accept all the possible adverse reactions/side effects.   Please note  You were cared for by a hospitalist during your hospital stay. If you have any questions about your discharge medications or the care you received while you were in the hospital after you are discharged, you can call the unit and asked to speak with the hospitalist on call if the hospitalist that took care of you is not available. Once you are discharged, your primary care physician will handle any further medical issues. Please note that NO REFILLS for any discharge medications will be authorized once you are discharged, as it is imperative that you return to your primary care physician (or establish a relationship with a primary care physician if you do not have one) for your aftercare needs so that they can reassess your need for medications and monitor your lab values.    Today   CHIEF COMPLAINT:   Chief Complaint  Patient presents with  . Cough  . Fever    HISTORY OF PRESENT ILLNESS:  Tracy Mcmahon  is a 83 y.o. female with a known history of multiple medical problems including depression, diabetes, hyperlipidemia, hypertension, chronic renal insufficiency stage III, restless leg syndrome, who presents to the hospital with complaints of cough going on for the past 1-2 days, fever, weakness, read on arrival to emergency room, she was noted to have pneumonia and hospice services were contacted for admission. She also was noted to have elevated white blood cell count and lactic acid level. Chest x-ray revealed multifocal pneumonia, small bilateral pleural effusions. Patient was admitted to the hospital for further evaluation and treatment, was initiated on Rocephin and Zithromax, supportive therapy with steroids, inhalation therapy, nebulizers and her condition  improved. Her oxygenation improved to 97, 98% on 2 L of oxygen through nasal cannula and her mental status also improved, her appetite was good and she was able to eat about 90% of offered meals. She was felt to be stable to be discharged home. She was evaluated by physical therapist and recommended home health services which are going to be arranged for her upon discharge. Discussion by problem: 14. Sepsis with  lactic acidosis, leukocytosis and chest x-ray evidence of multifocal pneumonia. Continue  Zithromax at home for 4 more days to complete course. Blood cultures are negative , sputum culture is pending at the time of discharge. White blood cell count has improved.  15. Acute on chronic hypoxic and hypercarbic respiratory failure. Continue oxygen supplementation, now on  2 L nasal  cannula , O2 sat is good at 98%. 16. COPD exacerbation. Continue  budesonide and DuoNeb nebulizers, Mucinex,  , prednisone taper, patient has improved clinically 17. Acute metabolic encephalopathy secondary to sepsis,  , resolved. 18. History of DVT on Eliquis 19. Essential hypertension on atenolol, patient's blood pressure was elevated while in the hospital, it is recommended to recheck her blood pressure as outpatient and make decisions about advancement of medications if needed 20. Hyperlipidemia unspecified on pravastatin 21. Depression, fluoxetine 22. History of polymyalgia rheumatica 23. Weakness. Physical therapy evaluation is completed, home health services are recommended upon discharge 24. Chronic anemia continue to follow 25. Chronic kidney disease stage III continue to monitor while here in the hospital, stable Type 2 diabetes mellitus. She has blood glucose levels were markedly elevated while on steroids in the hospital, she is to taper steroids, continue Lantus at 80 units at home   VITAL SIGNS:  Blood pressure (!) 163/56, pulse 73, temperature 97.9 F (36.6 C), temperature source Oral, resp. rate 18,  height 5' (1.524 m), weight 83.3 kg (183 lb 9.6 oz), SpO2 97 %.  I/O:   Intake/Output Summary (Last 24 hours) at 12/19/16 1751 Last data filed at 12/18/16 1833  Gross per 24 hour  Intake                0 ml  Output              200 ml  Net             -200 ml    PHYSICAL EXAMINATION:  GENERAL:  83 y.o.-year-old patient lying in the bed with no acute distress.  EYES: Pupils equal, round, reactive to light and accommodation. No scleral icterus. Extraocular muscles intact.  HEENT: Head atraumatic, normocephalic. Oropharynx and nasopharynx clear.  NECK:  Supple, no jugular venous distention. No thyroid enlargement, no tenderness.  LUNGS: Normal breath sounds bilaterally, no wheezing, rales,rhonchi or crepitation. No use of accessory muscles of respiration.  CARDIOVASCULAR: S1, S2 normal. No murmurs, rubs, or gallops.  ABDOMEN: Soft, non-tender, non-distended. Bowel sounds present. No organomegaly or mass.  EXTREMITIES: No pedal edema, cyanosis, or clubbing.  NEUROLOGIC: Cranial nerves II through XII are intact. Muscle strength 5/5 in all extremities. Sensation intact. Gait not checked.  PSYCHIATRIC: The patient is alert and oriented x 3.  SKIN: No obvious rash, lesion, or ulcer.   DATA REVIEW:   CBC  Recent Labs Lab 12/19/16 0523  WBC 15.4*  HGB 9.0*  HCT 27.3*  PLT 366    Chemistries   Recent Labs Lab 12/19/16 0523  NA 138  K 4.3  CL 97*  CO2 33*  GLUCOSE 176*  BUN 47*  CREATININE 1.13*  CALCIUM 8.4*    Cardiac Enzymes  Recent Labs Lab 12/17/16 1322  TROPONINI <0.03    Microbiology Results  Results for orders placed or performed during the hospital encounter of 12/16/16  Culture, blood (Routine X 2) w Reflex to ID Panel     Status: None (Preliminary result)   Collection Time: 12/16/16  8:27 PM  Result Value Ref Range Status   Specimen Description BLOOD RIGHT ARM  Final   Special Requests   Final    BOTTLES DRAWN AEROBIC AND ANAEROBIC AER 7ML ANA 11ML    Culture NO GROWTH 3 DAYS  Final   Report Status PENDING  Incomplete  Culture, blood (Routine X 2) w Reflex to ID Panel     Status: None (Preliminary result)   Collection  Time: 12/16/16  8:27 PM  Result Value Ref Range Status   Specimen Description BLOOD LEFT FA  Final   Special Requests   Final    BOTTLES DRAWN AEROBIC AND ANAEROBIC AER 5ML ANA 8ML   Culture NO GROWTH 3 DAYS  Final   Report Status PENDING  Incomplete    RADIOLOGY:  No results found.  EKG:   Orders placed or performed during the hospital encounter of 03/29/15  . EKG 12-Lead  . EKG 12-Lead  . EKG      Management plans discussed with the patient, family and they are in agreement.  CODE STATUS:     Code Status Orders        Start     Ordered   12/17/16 0126  Full code  Continuous     12/17/16 0125    Code Status History    Date Active Date Inactive Code Status Order ID Comments User Context   08/11/2016  3:24 AM 08/11/2016  9:37 AM Full Code [id number]  Lance Coon, MD Inpatient   03/29/2015  9:15 PM 03/30/2015  3:55 PM Full Code [id number]  Nicholes Mango, MD Inpatient    Advance Directive Documentation   Flowsheet Row Most Recent Value  Type of Advance Directive  Living will  Pre-existing out of facility DNR order (yellow form or pink MOST form)  No data  "MOST" Form in Place?  No data      TOTAL TIME TAKING CARE OF THIS PATIENT: 40 minutes.  Discussed this patient's daughter  Theodoro Grist M.D on 12/19/2016 at 5:51 PM  Between 7am to 6pm - Pager - [phone]  After 6pm go to [email or web address] - password [email or web address] [facility] [facility]  Office  [phone]  CC: Primary care physician; [name], MD

## 2016-12-19 NOTE — Progress Notes (Signed)
Inpatient Diabetes Program Recommendations  AACE/ADA: New Consensus Statement on Inpatient Glycemic Control (2015)  Target Ranges:  Prepandial:   less than 140 mg/dL      Peak postprandial:   less than 180 mg/dL (1-2 hours)      Critically ill patients:  140 - 180 mg/dL  Results for Tracy Mcmahon, Tracy Mcmahon (MRN [id number]) as of 12/19/2016 10:16  Ref. Range 12/18/2016 07:41 12/18/2016 11:37 12/18/2016 17:15 12/18/2016 21:55 12/19/2016 08:16  Glucose-Capillary Latest Ref Range: 65 - 99 mg/dL 272 (H) 398 (H) 231 (H) 237 (H) 188 (H)    Review of Glycemic Control  Current orders for Inpatient glycemic control: Lantus 80 units daily, Novolog 0-20 units TID with meals, Novolog 0-5 units QHS, Novolog 6 units TID with meals  Inpatient Diabetes Program Recommendations: Insulin - Meal Coverage: If steroids are continued as ordered, please consider increasing meal coverage to Novolog 12 units TID with meals.  Thanks, Barnie Alderman, RN, MSN, CDE Diabetes Coordinator Inpatient Diabetes Program [phone] (Team Pager from 8am to 5pm)

## 2016-12-19 NOTE — Progress Notes (Signed)
PHARMACIST - PHYSICIAN COMMUNICATION DR:   Ether Griffins CONCERNING: Antibiotic IV to Oral Route Change Policy  RECOMMENDATION: This patient is receiving azithromycin 500 mg by the intravenous route.  Based on criteria approved by the Pharmacy and Therapeutics Committee, the antibiotic(s) is/are being converted to the equivalent oral dose form(s).   DESCRIPTION: These criteria include:  Patient being treated for a respiratory tract infection, urinary tract infection, cellulitis or clostridium difficile associated diarrhea if on metronidazole  The patient is not neutropenic and does not exhibit a GI malabsorption state  The patient is eating (either orally or via tube) and/or has been taking other orally administered medications for a least 24 hours  The patient is improving clinically and has a Tmax < 100.5  If you have questions about this conversion, please contact the [place], PharmD Pharmacy Resident 12/19/2016 11:52 AM

## 2016-12-21 DIAGNOSIS — J440 Chronic obstructive pulmonary disease with acute lower respiratory infection: Secondary | ICD-10-CM | POA: Diagnosis not present

## 2016-12-21 DIAGNOSIS — J9621 Acute and chronic respiratory failure with hypoxia: Secondary | ICD-10-CM | POA: Diagnosis not present

## 2016-12-21 DIAGNOSIS — J189 Pneumonia, unspecified organism: Secondary | ICD-10-CM | POA: Diagnosis not present

## 2016-12-21 DIAGNOSIS — N183 Chronic kidney disease, stage 3 (moderate): Secondary | ICD-10-CM | POA: Diagnosis not present

## 2016-12-21 DIAGNOSIS — E1142 Type 2 diabetes mellitus with diabetic polyneuropathy: Secondary | ICD-10-CM | POA: Diagnosis not present

## 2016-12-21 DIAGNOSIS — E1122 Type 2 diabetes mellitus with diabetic chronic kidney disease: Secondary | ICD-10-CM | POA: Diagnosis not present

## 2016-12-21 DIAGNOSIS — I129 Hypertensive chronic kidney disease with stage 1 through stage 4 chronic kidney disease, or unspecified chronic kidney disease: Secondary | ICD-10-CM | POA: Diagnosis not present

## 2016-12-21 DIAGNOSIS — J441 Chronic obstructive pulmonary disease with (acute) exacerbation: Secondary | ICD-10-CM | POA: Diagnosis not present

## 2016-12-21 DIAGNOSIS — J9622 Acute and chronic respiratory failure with hypercapnia: Secondary | ICD-10-CM | POA: Diagnosis not present

## 2016-12-21 LAB — CULTURE, BLOOD (ROUTINE X 2)
CULTURE: NO GROWTH
CULTURE: NO GROWTH

## 2016-12-24 DIAGNOSIS — E1122 Type 2 diabetes mellitus with diabetic chronic kidney disease: Secondary | ICD-10-CM | POA: Diagnosis not present

## 2016-12-24 DIAGNOSIS — J9621 Acute and chronic respiratory failure with hypoxia: Secondary | ICD-10-CM | POA: Diagnosis not present

## 2016-12-24 DIAGNOSIS — J189 Pneumonia, unspecified organism: Secondary | ICD-10-CM | POA: Diagnosis not present

## 2016-12-24 DIAGNOSIS — J9622 Acute and chronic respiratory failure with hypercapnia: Secondary | ICD-10-CM | POA: Diagnosis not present

## 2016-12-24 DIAGNOSIS — I129 Hypertensive chronic kidney disease with stage 1 through stage 4 chronic kidney disease, or unspecified chronic kidney disease: Secondary | ICD-10-CM | POA: Diagnosis not present

## 2016-12-24 DIAGNOSIS — E1142 Type 2 diabetes mellitus with diabetic polyneuropathy: Secondary | ICD-10-CM | POA: Diagnosis not present

## 2016-12-24 DIAGNOSIS — J440 Chronic obstructive pulmonary disease with acute lower respiratory infection: Secondary | ICD-10-CM | POA: Diagnosis not present

## 2016-12-24 DIAGNOSIS — N183 Chronic kidney disease, stage 3 (moderate): Secondary | ICD-10-CM | POA: Diagnosis not present

## 2016-12-24 DIAGNOSIS — J441 Chronic obstructive pulmonary disease with (acute) exacerbation: Secondary | ICD-10-CM | POA: Diagnosis not present

## 2016-12-25 DIAGNOSIS — E1122 Type 2 diabetes mellitus with diabetic chronic kidney disease: Secondary | ICD-10-CM | POA: Diagnosis not present

## 2016-12-25 DIAGNOSIS — J441 Chronic obstructive pulmonary disease with (acute) exacerbation: Secondary | ICD-10-CM | POA: Diagnosis not present

## 2016-12-25 DIAGNOSIS — I129 Hypertensive chronic kidney disease with stage 1 through stage 4 chronic kidney disease, or unspecified chronic kidney disease: Secondary | ICD-10-CM | POA: Diagnosis not present

## 2016-12-25 DIAGNOSIS — N183 Chronic kidney disease, stage 3 (moderate): Secondary | ICD-10-CM | POA: Diagnosis not present

## 2016-12-25 DIAGNOSIS — J9621 Acute and chronic respiratory failure with hypoxia: Secondary | ICD-10-CM | POA: Diagnosis not present

## 2016-12-25 DIAGNOSIS — E1142 Type 2 diabetes mellitus with diabetic polyneuropathy: Secondary | ICD-10-CM | POA: Diagnosis not present

## 2016-12-25 DIAGNOSIS — J189 Pneumonia, unspecified organism: Secondary | ICD-10-CM | POA: Diagnosis not present

## 2016-12-25 DIAGNOSIS — J440 Chronic obstructive pulmonary disease with acute lower respiratory infection: Secondary | ICD-10-CM | POA: Diagnosis not present

## 2016-12-25 DIAGNOSIS — J9622 Acute and chronic respiratory failure with hypercapnia: Secondary | ICD-10-CM | POA: Diagnosis not present

## 2016-12-26 DIAGNOSIS — J9622 Acute and chronic respiratory failure with hypercapnia: Secondary | ICD-10-CM | POA: Diagnosis not present

## 2016-12-26 DIAGNOSIS — J441 Chronic obstructive pulmonary disease with (acute) exacerbation: Secondary | ICD-10-CM | POA: Diagnosis not present

## 2016-12-26 DIAGNOSIS — J440 Chronic obstructive pulmonary disease with acute lower respiratory infection: Secondary | ICD-10-CM | POA: Diagnosis not present

## 2016-12-26 DIAGNOSIS — J9621 Acute and chronic respiratory failure with hypoxia: Secondary | ICD-10-CM | POA: Diagnosis not present

## 2016-12-26 DIAGNOSIS — E1122 Type 2 diabetes mellitus with diabetic chronic kidney disease: Secondary | ICD-10-CM | POA: Diagnosis not present

## 2016-12-26 DIAGNOSIS — J189 Pneumonia, unspecified organism: Secondary | ICD-10-CM | POA: Diagnosis not present

## 2016-12-26 DIAGNOSIS — I129 Hypertensive chronic kidney disease with stage 1 through stage 4 chronic kidney disease, or unspecified chronic kidney disease: Secondary | ICD-10-CM | POA: Diagnosis not present

## 2016-12-26 DIAGNOSIS — E1142 Type 2 diabetes mellitus with diabetic polyneuropathy: Secondary | ICD-10-CM | POA: Diagnosis not present

## 2016-12-26 DIAGNOSIS — N183 Chronic kidney disease, stage 3 (moderate): Secondary | ICD-10-CM | POA: Diagnosis not present

## 2016-12-27 DIAGNOSIS — R0902 Hypoxemia: Secondary | ICD-10-CM | POA: Diagnosis not present

## 2016-12-27 DIAGNOSIS — J449 Chronic obstructive pulmonary disease, unspecified: Secondary | ICD-10-CM | POA: Diagnosis not present

## 2016-12-27 DIAGNOSIS — J189 Pneumonia, unspecified organism: Secondary | ICD-10-CM | POA: Diagnosis not present

## 2016-12-27 DIAGNOSIS — R0609 Other forms of dyspnea: Secondary | ICD-10-CM | POA: Diagnosis not present

## 2016-12-27 DIAGNOSIS — J439 Emphysema, unspecified: Secondary | ICD-10-CM | POA: Diagnosis not present

## 2016-12-30 DIAGNOSIS — N183 Chronic kidney disease, stage 3 (moderate): Secondary | ICD-10-CM | POA: Diagnosis not present

## 2016-12-30 DIAGNOSIS — J440 Chronic obstructive pulmonary disease with acute lower respiratory infection: Secondary | ICD-10-CM | POA: Diagnosis not present

## 2016-12-30 DIAGNOSIS — J9621 Acute and chronic respiratory failure with hypoxia: Secondary | ICD-10-CM | POA: Diagnosis not present

## 2016-12-30 DIAGNOSIS — J189 Pneumonia, unspecified organism: Secondary | ICD-10-CM | POA: Diagnosis not present

## 2016-12-30 DIAGNOSIS — E1122 Type 2 diabetes mellitus with diabetic chronic kidney disease: Secondary | ICD-10-CM | POA: Diagnosis not present

## 2016-12-30 DIAGNOSIS — J441 Chronic obstructive pulmonary disease with (acute) exacerbation: Secondary | ICD-10-CM | POA: Diagnosis not present

## 2016-12-30 DIAGNOSIS — E1142 Type 2 diabetes mellitus with diabetic polyneuropathy: Secondary | ICD-10-CM | POA: Diagnosis not present

## 2016-12-30 DIAGNOSIS — J9622 Acute and chronic respiratory failure with hypercapnia: Secondary | ICD-10-CM | POA: Diagnosis not present

## 2016-12-30 DIAGNOSIS — I129 Hypertensive chronic kidney disease with stage 1 through stage 4 chronic kidney disease, or unspecified chronic kidney disease: Secondary | ICD-10-CM | POA: Diagnosis not present

## 2017-01-01 DIAGNOSIS — J9621 Acute and chronic respiratory failure with hypoxia: Secondary | ICD-10-CM | POA: Diagnosis not present

## 2017-01-01 DIAGNOSIS — E1142 Type 2 diabetes mellitus with diabetic polyneuropathy: Secondary | ICD-10-CM | POA: Diagnosis not present

## 2017-01-01 DIAGNOSIS — J440 Chronic obstructive pulmonary disease with acute lower respiratory infection: Secondary | ICD-10-CM | POA: Diagnosis not present

## 2017-01-01 DIAGNOSIS — I129 Hypertensive chronic kidney disease with stage 1 through stage 4 chronic kidney disease, or unspecified chronic kidney disease: Secondary | ICD-10-CM | POA: Diagnosis not present

## 2017-01-01 DIAGNOSIS — N183 Chronic kidney disease, stage 3 (moderate): Secondary | ICD-10-CM | POA: Diagnosis not present

## 2017-01-01 DIAGNOSIS — J441 Chronic obstructive pulmonary disease with (acute) exacerbation: Secondary | ICD-10-CM | POA: Diagnosis not present

## 2017-01-01 DIAGNOSIS — J189 Pneumonia, unspecified organism: Secondary | ICD-10-CM | POA: Diagnosis not present

## 2017-01-01 DIAGNOSIS — J9622 Acute and chronic respiratory failure with hypercapnia: Secondary | ICD-10-CM | POA: Diagnosis not present

## 2017-01-01 DIAGNOSIS — E1122 Type 2 diabetes mellitus with diabetic chronic kidney disease: Secondary | ICD-10-CM | POA: Diagnosis not present

## 2017-01-02 DIAGNOSIS — E1122 Type 2 diabetes mellitus with diabetic chronic kidney disease: Secondary | ICD-10-CM | POA: Diagnosis not present

## 2017-01-02 DIAGNOSIS — J440 Chronic obstructive pulmonary disease with acute lower respiratory infection: Secondary | ICD-10-CM | POA: Diagnosis not present

## 2017-01-02 DIAGNOSIS — N183 Chronic kidney disease, stage 3 (moderate): Secondary | ICD-10-CM | POA: Diagnosis not present

## 2017-01-02 DIAGNOSIS — J441 Chronic obstructive pulmonary disease with (acute) exacerbation: Secondary | ICD-10-CM | POA: Diagnosis not present

## 2017-01-02 DIAGNOSIS — E1142 Type 2 diabetes mellitus with diabetic polyneuropathy: Secondary | ICD-10-CM | POA: Diagnosis not present

## 2017-01-02 DIAGNOSIS — J9621 Acute and chronic respiratory failure with hypoxia: Secondary | ICD-10-CM | POA: Diagnosis not present

## 2017-01-02 DIAGNOSIS — J9622 Acute and chronic respiratory failure with hypercapnia: Secondary | ICD-10-CM | POA: Diagnosis not present

## 2017-01-02 DIAGNOSIS — I129 Hypertensive chronic kidney disease with stage 1 through stage 4 chronic kidney disease, or unspecified chronic kidney disease: Secondary | ICD-10-CM | POA: Diagnosis not present

## 2017-01-02 DIAGNOSIS — J189 Pneumonia, unspecified organism: Secondary | ICD-10-CM | POA: Diagnosis not present

## 2017-01-06 DIAGNOSIS — J440 Chronic obstructive pulmonary disease with acute lower respiratory infection: Secondary | ICD-10-CM | POA: Diagnosis not present

## 2017-01-06 DIAGNOSIS — E1142 Type 2 diabetes mellitus with diabetic polyneuropathy: Secondary | ICD-10-CM | POA: Diagnosis not present

## 2017-01-06 DIAGNOSIS — I129 Hypertensive chronic kidney disease with stage 1 through stage 4 chronic kidney disease, or unspecified chronic kidney disease: Secondary | ICD-10-CM | POA: Diagnosis not present

## 2017-01-06 DIAGNOSIS — J441 Chronic obstructive pulmonary disease with (acute) exacerbation: Secondary | ICD-10-CM | POA: Diagnosis not present

## 2017-01-06 DIAGNOSIS — E1122 Type 2 diabetes mellitus with diabetic chronic kidney disease: Secondary | ICD-10-CM | POA: Diagnosis not present

## 2017-01-06 DIAGNOSIS — J9622 Acute and chronic respiratory failure with hypercapnia: Secondary | ICD-10-CM | POA: Diagnosis not present

## 2017-01-06 DIAGNOSIS — N183 Chronic kidney disease, stage 3 (moderate): Secondary | ICD-10-CM | POA: Diagnosis not present

## 2017-01-06 DIAGNOSIS — J9621 Acute and chronic respiratory failure with hypoxia: Secondary | ICD-10-CM | POA: Diagnosis not present

## 2017-01-06 DIAGNOSIS — J189 Pneumonia, unspecified organism: Secondary | ICD-10-CM | POA: Diagnosis not present

## 2017-01-07 DIAGNOSIS — D631 Anemia in chronic kidney disease: Secondary | ICD-10-CM | POA: Diagnosis not present

## 2017-01-07 DIAGNOSIS — Z79899 Other long term (current) drug therapy: Secondary | ICD-10-CM | POA: Diagnosis not present

## 2017-01-07 DIAGNOSIS — E782 Mixed hyperlipidemia: Secondary | ICD-10-CM | POA: Diagnosis not present

## 2017-01-07 DIAGNOSIS — E559 Vitamin D deficiency, unspecified: Secondary | ICD-10-CM | POA: Diagnosis not present

## 2017-01-07 DIAGNOSIS — M353 Polymyalgia rheumatica: Secondary | ICD-10-CM | POA: Diagnosis not present

## 2017-01-07 DIAGNOSIS — R350 Frequency of micturition: Secondary | ICD-10-CM | POA: Diagnosis not present

## 2017-01-07 DIAGNOSIS — N183 Chronic kidney disease, stage 3 (moderate): Secondary | ICD-10-CM | POA: Diagnosis not present

## 2017-01-07 DIAGNOSIS — Z794 Long term (current) use of insulin: Secondary | ICD-10-CM | POA: Diagnosis not present

## 2017-01-07 DIAGNOSIS — J90 Pleural effusion, not elsewhere classified: Secondary | ICD-10-CM | POA: Diagnosis not present

## 2017-01-07 DIAGNOSIS — E1122 Type 2 diabetes mellitus with diabetic chronic kidney disease: Secondary | ICD-10-CM | POA: Diagnosis not present

## 2017-01-07 DIAGNOSIS — I10 Essential (primary) hypertension: Secondary | ICD-10-CM | POA: Diagnosis not present

## 2017-01-07 DIAGNOSIS — Z8701 Personal history of pneumonia (recurrent): Secondary | ICD-10-CM | POA: Diagnosis not present

## 2017-01-07 DIAGNOSIS — N390 Urinary tract infection, site not specified: Secondary | ICD-10-CM | POA: Diagnosis not present

## 2017-01-08 DIAGNOSIS — J189 Pneumonia, unspecified organism: Secondary | ICD-10-CM | POA: Diagnosis not present

## 2017-01-08 DIAGNOSIS — J9621 Acute and chronic respiratory failure with hypoxia: Secondary | ICD-10-CM | POA: Diagnosis not present

## 2017-01-08 DIAGNOSIS — J440 Chronic obstructive pulmonary disease with acute lower respiratory infection: Secondary | ICD-10-CM | POA: Diagnosis not present

## 2017-01-08 DIAGNOSIS — J441 Chronic obstructive pulmonary disease with (acute) exacerbation: Secondary | ICD-10-CM | POA: Diagnosis not present

## 2017-01-08 DIAGNOSIS — E1142 Type 2 diabetes mellitus with diabetic polyneuropathy: Secondary | ICD-10-CM | POA: Diagnosis not present

## 2017-01-08 DIAGNOSIS — N183 Chronic kidney disease, stage 3 (moderate): Secondary | ICD-10-CM | POA: Diagnosis not present

## 2017-01-08 DIAGNOSIS — I129 Hypertensive chronic kidney disease with stage 1 through stage 4 chronic kidney disease, or unspecified chronic kidney disease: Secondary | ICD-10-CM | POA: Diagnosis not present

## 2017-01-08 DIAGNOSIS — J9622 Acute and chronic respiratory failure with hypercapnia: Secondary | ICD-10-CM | POA: Diagnosis not present

## 2017-01-08 DIAGNOSIS — E1122 Type 2 diabetes mellitus with diabetic chronic kidney disease: Secondary | ICD-10-CM | POA: Diagnosis not present

## 2017-01-13 DIAGNOSIS — J189 Pneumonia, unspecified organism: Secondary | ICD-10-CM | POA: Diagnosis not present

## 2017-01-13 DIAGNOSIS — N183 Chronic kidney disease, stage 3 (moderate): Secondary | ICD-10-CM | POA: Diagnosis not present

## 2017-01-13 DIAGNOSIS — J441 Chronic obstructive pulmonary disease with (acute) exacerbation: Secondary | ICD-10-CM | POA: Diagnosis not present

## 2017-01-13 DIAGNOSIS — J440 Chronic obstructive pulmonary disease with acute lower respiratory infection: Secondary | ICD-10-CM | POA: Diagnosis not present

## 2017-01-13 DIAGNOSIS — E1122 Type 2 diabetes mellitus with diabetic chronic kidney disease: Secondary | ICD-10-CM | POA: Diagnosis not present

## 2017-01-13 DIAGNOSIS — J9621 Acute and chronic respiratory failure with hypoxia: Secondary | ICD-10-CM | POA: Diagnosis not present

## 2017-01-13 DIAGNOSIS — J9622 Acute and chronic respiratory failure with hypercapnia: Secondary | ICD-10-CM | POA: Diagnosis not present

## 2017-01-13 DIAGNOSIS — I129 Hypertensive chronic kidney disease with stage 1 through stage 4 chronic kidney disease, or unspecified chronic kidney disease: Secondary | ICD-10-CM | POA: Diagnosis not present

## 2017-01-13 DIAGNOSIS — E1142 Type 2 diabetes mellitus with diabetic polyneuropathy: Secondary | ICD-10-CM | POA: Diagnosis not present

## 2017-01-14 DIAGNOSIS — R0902 Hypoxemia: Secondary | ICD-10-CM | POA: Diagnosis not present

## 2017-01-14 DIAGNOSIS — M6080 Other myositis, unspecified site: Secondary | ICD-10-CM | POA: Diagnosis not present

## 2017-01-14 DIAGNOSIS — I10 Essential (primary) hypertension: Secondary | ICD-10-CM | POA: Diagnosis not present

## 2017-01-15 DIAGNOSIS — J440 Chronic obstructive pulmonary disease with acute lower respiratory infection: Secondary | ICD-10-CM | POA: Diagnosis not present

## 2017-01-15 DIAGNOSIS — I129 Hypertensive chronic kidney disease with stage 1 through stage 4 chronic kidney disease, or unspecified chronic kidney disease: Secondary | ICD-10-CM | POA: Diagnosis not present

## 2017-01-15 DIAGNOSIS — E1122 Type 2 diabetes mellitus with diabetic chronic kidney disease: Secondary | ICD-10-CM | POA: Diagnosis not present

## 2017-01-15 DIAGNOSIS — J9621 Acute and chronic respiratory failure with hypoxia: Secondary | ICD-10-CM | POA: Diagnosis not present

## 2017-01-15 DIAGNOSIS — E1142 Type 2 diabetes mellitus with diabetic polyneuropathy: Secondary | ICD-10-CM | POA: Diagnosis not present

## 2017-01-15 DIAGNOSIS — N183 Chronic kidney disease, stage 3 (moderate): Secondary | ICD-10-CM | POA: Diagnosis not present

## 2017-01-15 DIAGNOSIS — J441 Chronic obstructive pulmonary disease with (acute) exacerbation: Secondary | ICD-10-CM | POA: Diagnosis not present

## 2017-01-15 DIAGNOSIS — J9622 Acute and chronic respiratory failure with hypercapnia: Secondary | ICD-10-CM | POA: Diagnosis not present

## 2017-01-15 DIAGNOSIS — J189 Pneumonia, unspecified organism: Secondary | ICD-10-CM | POA: Diagnosis not present

## 2017-01-16 DIAGNOSIS — I129 Hypertensive chronic kidney disease with stage 1 through stage 4 chronic kidney disease, or unspecified chronic kidney disease: Secondary | ICD-10-CM | POA: Diagnosis not present

## 2017-01-16 DIAGNOSIS — J440 Chronic obstructive pulmonary disease with acute lower respiratory infection: Secondary | ICD-10-CM | POA: Diagnosis not present

## 2017-01-16 DIAGNOSIS — E1122 Type 2 diabetes mellitus with diabetic chronic kidney disease: Secondary | ICD-10-CM | POA: Diagnosis not present

## 2017-01-16 DIAGNOSIS — J189 Pneumonia, unspecified organism: Secondary | ICD-10-CM | POA: Diagnosis not present

## 2017-01-16 DIAGNOSIS — J9621 Acute and chronic respiratory failure with hypoxia: Secondary | ICD-10-CM | POA: Diagnosis not present

## 2017-01-16 DIAGNOSIS — E1142 Type 2 diabetes mellitus with diabetic polyneuropathy: Secondary | ICD-10-CM | POA: Diagnosis not present

## 2017-01-16 DIAGNOSIS — J441 Chronic obstructive pulmonary disease with (acute) exacerbation: Secondary | ICD-10-CM | POA: Diagnosis not present

## 2017-01-16 DIAGNOSIS — N183 Chronic kidney disease, stage 3 (moderate): Secondary | ICD-10-CM | POA: Diagnosis not present

## 2017-01-16 DIAGNOSIS — J9622 Acute and chronic respiratory failure with hypercapnia: Secondary | ICD-10-CM | POA: Diagnosis not present

## 2017-01-23 DIAGNOSIS — J440 Chronic obstructive pulmonary disease with acute lower respiratory infection: Secondary | ICD-10-CM | POA: Diagnosis not present

## 2017-01-23 DIAGNOSIS — I129 Hypertensive chronic kidney disease with stage 1 through stage 4 chronic kidney disease, or unspecified chronic kidney disease: Secondary | ICD-10-CM | POA: Diagnosis not present

## 2017-01-23 DIAGNOSIS — E1122 Type 2 diabetes mellitus with diabetic chronic kidney disease: Secondary | ICD-10-CM | POA: Diagnosis not present

## 2017-01-23 DIAGNOSIS — E1142 Type 2 diabetes mellitus with diabetic polyneuropathy: Secondary | ICD-10-CM | POA: Diagnosis not present

## 2017-01-23 DIAGNOSIS — N183 Chronic kidney disease, stage 3 (moderate): Secondary | ICD-10-CM | POA: Diagnosis not present

## 2017-01-23 DIAGNOSIS — J9622 Acute and chronic respiratory failure with hypercapnia: Secondary | ICD-10-CM | POA: Diagnosis not present

## 2017-01-23 DIAGNOSIS — J9621 Acute and chronic respiratory failure with hypoxia: Secondary | ICD-10-CM | POA: Diagnosis not present

## 2017-01-23 DIAGNOSIS — J189 Pneumonia, unspecified organism: Secondary | ICD-10-CM | POA: Diagnosis not present

## 2017-01-23 DIAGNOSIS — J441 Chronic obstructive pulmonary disease with (acute) exacerbation: Secondary | ICD-10-CM | POA: Diagnosis not present

## 2017-01-27 DIAGNOSIS — J9622 Acute and chronic respiratory failure with hypercapnia: Secondary | ICD-10-CM | POA: Diagnosis not present

## 2017-01-27 DIAGNOSIS — N183 Chronic kidney disease, stage 3 (moderate): Secondary | ICD-10-CM | POA: Diagnosis not present

## 2017-01-27 DIAGNOSIS — J9621 Acute and chronic respiratory failure with hypoxia: Secondary | ICD-10-CM | POA: Diagnosis not present

## 2017-01-27 DIAGNOSIS — J441 Chronic obstructive pulmonary disease with (acute) exacerbation: Secondary | ICD-10-CM | POA: Diagnosis not present

## 2017-01-27 DIAGNOSIS — E1142 Type 2 diabetes mellitus with diabetic polyneuropathy: Secondary | ICD-10-CM | POA: Diagnosis not present

## 2017-01-27 DIAGNOSIS — J440 Chronic obstructive pulmonary disease with acute lower respiratory infection: Secondary | ICD-10-CM | POA: Diagnosis not present

## 2017-01-27 DIAGNOSIS — J189 Pneumonia, unspecified organism: Secondary | ICD-10-CM | POA: Diagnosis not present

## 2017-01-27 DIAGNOSIS — I129 Hypertensive chronic kidney disease with stage 1 through stage 4 chronic kidney disease, or unspecified chronic kidney disease: Secondary | ICD-10-CM | POA: Diagnosis not present

## 2017-01-27 DIAGNOSIS — E1122 Type 2 diabetes mellitus with diabetic chronic kidney disease: Secondary | ICD-10-CM | POA: Diagnosis not present

## 2017-02-11 DIAGNOSIS — R0902 Hypoxemia: Secondary | ICD-10-CM | POA: Diagnosis not present

## 2017-02-11 DIAGNOSIS — I10 Essential (primary) hypertension: Secondary | ICD-10-CM | POA: Diagnosis not present

## 2017-02-11 DIAGNOSIS — M6080 Other myositis, unspecified site: Secondary | ICD-10-CM | POA: Diagnosis not present

## 2017-02-13 DIAGNOSIS — N183 Chronic kidney disease, stage 3 (moderate): Secondary | ICD-10-CM | POA: Diagnosis not present

## 2017-02-13 DIAGNOSIS — J440 Chronic obstructive pulmonary disease with acute lower respiratory infection: Secondary | ICD-10-CM | POA: Diagnosis not present

## 2017-02-13 DIAGNOSIS — J9621 Acute and chronic respiratory failure with hypoxia: Secondary | ICD-10-CM | POA: Diagnosis not present

## 2017-02-13 DIAGNOSIS — J9622 Acute and chronic respiratory failure with hypercapnia: Secondary | ICD-10-CM | POA: Diagnosis not present

## 2017-02-13 DIAGNOSIS — E1142 Type 2 diabetes mellitus with diabetic polyneuropathy: Secondary | ICD-10-CM | POA: Diagnosis not present

## 2017-02-13 DIAGNOSIS — J441 Chronic obstructive pulmonary disease with (acute) exacerbation: Secondary | ICD-10-CM | POA: Diagnosis not present

## 2017-02-13 DIAGNOSIS — E1122 Type 2 diabetes mellitus with diabetic chronic kidney disease: Secondary | ICD-10-CM | POA: Diagnosis not present

## 2017-02-13 DIAGNOSIS — J189 Pneumonia, unspecified organism: Secondary | ICD-10-CM | POA: Diagnosis not present

## 2017-02-13 DIAGNOSIS — I129 Hypertensive chronic kidney disease with stage 1 through stage 4 chronic kidney disease, or unspecified chronic kidney disease: Secondary | ICD-10-CM | POA: Diagnosis not present

## 2017-03-14 DIAGNOSIS — M6080 Other myositis, unspecified site: Secondary | ICD-10-CM | POA: Diagnosis not present

## 2017-03-14 DIAGNOSIS — R0902 Hypoxemia: Secondary | ICD-10-CM | POA: Diagnosis not present

## 2017-03-14 DIAGNOSIS — I10 Essential (primary) hypertension: Secondary | ICD-10-CM | POA: Diagnosis not present

## 2017-03-31 DIAGNOSIS — N183 Chronic kidney disease, stage 3 (moderate): Secondary | ICD-10-CM | POA: Diagnosis not present

## 2017-03-31 DIAGNOSIS — E1122 Type 2 diabetes mellitus with diabetic chronic kidney disease: Secondary | ICD-10-CM | POA: Diagnosis not present

## 2017-03-31 DIAGNOSIS — M19012 Primary osteoarthritis, left shoulder: Secondary | ICD-10-CM | POA: Diagnosis not present

## 2017-03-31 DIAGNOSIS — Z794 Long term (current) use of insulin: Secondary | ICD-10-CM | POA: Diagnosis not present

## 2017-03-31 DIAGNOSIS — Z6841 Body Mass Index (BMI) 40.0 and over, adult: Secondary | ICD-10-CM | POA: Diagnosis not present

## 2017-04-09 DIAGNOSIS — J439 Emphysema, unspecified: Secondary | ICD-10-CM | POA: Diagnosis not present

## 2017-04-09 DIAGNOSIS — R918 Other nonspecific abnormal finding of lung field: Secondary | ICD-10-CM | POA: Diagnosis not present

## 2017-04-09 DIAGNOSIS — R0609 Other forms of dyspnea: Secondary | ICD-10-CM | POA: Diagnosis not present

## 2017-04-09 DIAGNOSIS — J90 Pleural effusion, not elsewhere classified: Secondary | ICD-10-CM | POA: Diagnosis not present

## 2017-04-09 DIAGNOSIS — Z9981 Dependence on supplemental oxygen: Secondary | ICD-10-CM | POA: Diagnosis not present

## 2017-04-13 DIAGNOSIS — R0902 Hypoxemia: Secondary | ICD-10-CM | POA: Diagnosis not present

## 2017-04-13 DIAGNOSIS — M6080 Other myositis, unspecified site: Secondary | ICD-10-CM | POA: Diagnosis not present

## 2017-04-13 DIAGNOSIS — I10 Essential (primary) hypertension: Secondary | ICD-10-CM | POA: Diagnosis not present

## 2017-04-16 DIAGNOSIS — H353114 Nonexudative age-related macular degeneration, right eye, advanced atrophic with subfoveal involvement: Secondary | ICD-10-CM | POA: Diagnosis not present

## 2017-04-16 DIAGNOSIS — H35372 Puckering of macula, left eye: Secondary | ICD-10-CM | POA: Diagnosis not present

## 2017-04-16 DIAGNOSIS — H353124 Nonexudative age-related macular degeneration, left eye, advanced atrophic with subfoveal involvement: Secondary | ICD-10-CM | POA: Diagnosis not present

## 2017-04-16 DIAGNOSIS — T1510XA Foreign body in conjunctival sac, unspecified eye, initial encounter: Secondary | ICD-10-CM | POA: Diagnosis not present

## 2017-04-16 DIAGNOSIS — E119 Type 2 diabetes mellitus without complications: Secondary | ICD-10-CM | POA: Diagnosis not present

## 2017-04-16 DIAGNOSIS — H353134 Nonexudative age-related macular degeneration, bilateral, advanced atrophic with subfoveal involvement: Secondary | ICD-10-CM | POA: Diagnosis not present

## 2017-04-16 DIAGNOSIS — H353212 Exudative age-related macular degeneration, right eye, with inactive choroidal neovascularization: Secondary | ICD-10-CM | POA: Diagnosis not present

## 2017-05-14 DIAGNOSIS — M6080 Other myositis, unspecified site: Secondary | ICD-10-CM | POA: Diagnosis not present

## 2017-05-14 DIAGNOSIS — I10 Essential (primary) hypertension: Secondary | ICD-10-CM | POA: Diagnosis not present

## 2017-05-14 DIAGNOSIS — R0902 Hypoxemia: Secondary | ICD-10-CM | POA: Diagnosis not present

## 2017-06-09 DIAGNOSIS — Z794 Long term (current) use of insulin: Secondary | ICD-10-CM | POA: Diagnosis not present

## 2017-06-09 DIAGNOSIS — E669 Obesity, unspecified: Secondary | ICD-10-CM | POA: Diagnosis not present

## 2017-06-09 DIAGNOSIS — M19012 Primary osteoarthritis, left shoulder: Secondary | ICD-10-CM | POA: Diagnosis not present

## 2017-06-09 DIAGNOSIS — N183 Chronic kidney disease, stage 3 (moderate): Secondary | ICD-10-CM | POA: Diagnosis not present

## 2017-06-09 DIAGNOSIS — E1122 Type 2 diabetes mellitus with diabetic chronic kidney disease: Secondary | ICD-10-CM | POA: Diagnosis not present

## 2017-06-13 DIAGNOSIS — R0902 Hypoxemia: Secondary | ICD-10-CM | POA: Diagnosis not present

## 2017-06-13 DIAGNOSIS — I10 Essential (primary) hypertension: Secondary | ICD-10-CM | POA: Diagnosis not present

## 2017-06-13 DIAGNOSIS — M6080 Other myositis, unspecified site: Secondary | ICD-10-CM | POA: Diagnosis not present

## 2017-06-20 DIAGNOSIS — J90 Pleural effusion, not elsewhere classified: Secondary | ICD-10-CM | POA: Diagnosis not present

## 2017-06-23 DIAGNOSIS — J90 Pleural effusion, not elsewhere classified: Secondary | ICD-10-CM | POA: Diagnosis not present

## 2017-07-04 DIAGNOSIS — Z79899 Other long term (current) drug therapy: Secondary | ICD-10-CM | POA: Diagnosis not present

## 2017-07-04 DIAGNOSIS — I10 Essential (primary) hypertension: Secondary | ICD-10-CM | POA: Diagnosis not present

## 2017-07-04 DIAGNOSIS — E1122 Type 2 diabetes mellitus with diabetic chronic kidney disease: Secondary | ICD-10-CM | POA: Diagnosis not present

## 2017-07-04 DIAGNOSIS — E782 Mixed hyperlipidemia: Secondary | ICD-10-CM | POA: Diagnosis not present

## 2017-07-04 DIAGNOSIS — E559 Vitamin D deficiency, unspecified: Secondary | ICD-10-CM | POA: Diagnosis not present

## 2017-07-04 DIAGNOSIS — N183 Chronic kidney disease, stage 3 (moderate): Secondary | ICD-10-CM | POA: Diagnosis not present

## 2017-07-04 DIAGNOSIS — J439 Emphysema, unspecified: Secondary | ICD-10-CM | POA: Diagnosis not present

## 2017-07-04 DIAGNOSIS — N390 Urinary tract infection, site not specified: Secondary | ICD-10-CM | POA: Diagnosis not present

## 2017-07-04 DIAGNOSIS — D631 Anemia in chronic kidney disease: Secondary | ICD-10-CM | POA: Diagnosis not present

## 2017-07-04 DIAGNOSIS — Z794 Long term (current) use of insulin: Secondary | ICD-10-CM | POA: Diagnosis not present

## 2017-07-04 DIAGNOSIS — G629 Polyneuropathy, unspecified: Secondary | ICD-10-CM | POA: Diagnosis not present

## 2017-07-07 DIAGNOSIS — N390 Urinary tract infection, site not specified: Secondary | ICD-10-CM | POA: Diagnosis not present

## 2017-07-07 DIAGNOSIS — A499 Bacterial infection, unspecified: Secondary | ICD-10-CM | POA: Diagnosis not present

## 2017-07-07 DIAGNOSIS — N183 Chronic kidney disease, stage 3 (moderate): Secondary | ICD-10-CM | POA: Diagnosis not present

## 2017-07-07 DIAGNOSIS — Z794 Long term (current) use of insulin: Secondary | ICD-10-CM | POA: Diagnosis not present

## 2017-07-07 DIAGNOSIS — E1122 Type 2 diabetes mellitus with diabetic chronic kidney disease: Secondary | ICD-10-CM | POA: Diagnosis not present

## 2017-07-14 DIAGNOSIS — R0902 Hypoxemia: Secondary | ICD-10-CM | POA: Diagnosis not present

## 2017-07-14 DIAGNOSIS — I10 Essential (primary) hypertension: Secondary | ICD-10-CM | POA: Diagnosis not present

## 2017-07-14 DIAGNOSIS — M6080 Other myositis, unspecified site: Secondary | ICD-10-CM | POA: Diagnosis not present

## 2017-08-14 DIAGNOSIS — R0902 Hypoxemia: Secondary | ICD-10-CM | POA: Diagnosis not present

## 2017-08-14 DIAGNOSIS — M6080 Other myositis, unspecified site: Secondary | ICD-10-CM | POA: Diagnosis not present

## 2017-08-14 DIAGNOSIS — I10 Essential (primary) hypertension: Secondary | ICD-10-CM | POA: Diagnosis not present

## 2017-08-21 DIAGNOSIS — J90 Pleural effusion, not elsewhere classified: Secondary | ICD-10-CM | POA: Diagnosis not present

## 2017-08-21 DIAGNOSIS — R0609 Other forms of dyspnea: Secondary | ICD-10-CM | POA: Diagnosis not present

## 2017-08-21 DIAGNOSIS — J439 Emphysema, unspecified: Secondary | ICD-10-CM | POA: Diagnosis not present

## 2017-08-21 DIAGNOSIS — R05 Cough: Secondary | ICD-10-CM | POA: Diagnosis not present

## 2017-09-13 DIAGNOSIS — M6080 Other myositis, unspecified site: Secondary | ICD-10-CM | POA: Diagnosis not present

## 2017-09-13 DIAGNOSIS — I10 Essential (primary) hypertension: Secondary | ICD-10-CM | POA: Diagnosis not present

## 2017-09-13 DIAGNOSIS — R0902 Hypoxemia: Secondary | ICD-10-CM | POA: Diagnosis not present

## 2017-10-07 DIAGNOSIS — Z79899 Other long term (current) drug therapy: Secondary | ICD-10-CM | POA: Diagnosis not present

## 2017-10-07 DIAGNOSIS — N183 Chronic kidney disease, stage 3 (moderate): Secondary | ICD-10-CM | POA: Diagnosis not present

## 2017-10-07 DIAGNOSIS — Z794 Long term (current) use of insulin: Secondary | ICD-10-CM | POA: Diagnosis not present

## 2017-10-07 DIAGNOSIS — E1122 Type 2 diabetes mellitus with diabetic chronic kidney disease: Secondary | ICD-10-CM | POA: Diagnosis not present

## 2017-10-07 DIAGNOSIS — E1165 Type 2 diabetes mellitus with hyperglycemia: Secondary | ICD-10-CM | POA: Diagnosis not present

## 2017-10-08 DIAGNOSIS — E669 Obesity, unspecified: Secondary | ICD-10-CM | POA: Diagnosis not present

## 2017-10-08 DIAGNOSIS — Z794 Long term (current) use of insulin: Secondary | ICD-10-CM | POA: Diagnosis not present

## 2017-10-08 DIAGNOSIS — M19012 Primary osteoarthritis, left shoulder: Secondary | ICD-10-CM | POA: Diagnosis not present

## 2017-10-08 DIAGNOSIS — E1122 Type 2 diabetes mellitus with diabetic chronic kidney disease: Secondary | ICD-10-CM | POA: Diagnosis not present

## 2017-10-08 DIAGNOSIS — N183 Chronic kidney disease, stage 3 (moderate): Secondary | ICD-10-CM | POA: Diagnosis not present

## 2017-10-14 DIAGNOSIS — M6080 Other myositis, unspecified site: Secondary | ICD-10-CM | POA: Diagnosis not present

## 2017-10-14 DIAGNOSIS — R0902 Hypoxemia: Secondary | ICD-10-CM | POA: Diagnosis not present

## 2017-10-14 DIAGNOSIS — I10 Essential (primary) hypertension: Secondary | ICD-10-CM | POA: Diagnosis not present

## 2017-11-13 DIAGNOSIS — M6080 Other myositis, unspecified site: Secondary | ICD-10-CM | POA: Diagnosis not present

## 2017-11-13 DIAGNOSIS — I10 Essential (primary) hypertension: Secondary | ICD-10-CM | POA: Diagnosis not present

## 2017-11-13 DIAGNOSIS — R0902 Hypoxemia: Secondary | ICD-10-CM | POA: Diagnosis not present

## 2017-12-02 DIAGNOSIS — J449 Chronic obstructive pulmonary disease, unspecified: Secondary | ICD-10-CM | POA: Diagnosis not present

## 2017-12-02 DIAGNOSIS — J9611 Chronic respiratory failure with hypoxia: Secondary | ICD-10-CM | POA: Diagnosis not present

## 2017-12-02 DIAGNOSIS — R0609 Other forms of dyspnea: Secondary | ICD-10-CM | POA: Diagnosis not present

## 2017-12-02 DIAGNOSIS — Z9981 Dependence on supplemental oxygen: Secondary | ICD-10-CM | POA: Diagnosis not present

## 2017-12-02 DIAGNOSIS — R05 Cough: Secondary | ICD-10-CM | POA: Diagnosis not present

## 2017-12-02 DIAGNOSIS — J439 Emphysema, unspecified: Secondary | ICD-10-CM | POA: Diagnosis not present

## 2017-12-14 DIAGNOSIS — I10 Essential (primary) hypertension: Secondary | ICD-10-CM | POA: Diagnosis not present

## 2017-12-14 DIAGNOSIS — M6080 Other myositis, unspecified site: Secondary | ICD-10-CM | POA: Diagnosis not present

## 2017-12-14 DIAGNOSIS — R0902 Hypoxemia: Secondary | ICD-10-CM | POA: Diagnosis not present

## 2017-12-30 ENCOUNTER — Other Ambulatory Visit: Payer: Self-pay

## 2017-12-30 ENCOUNTER — Emergency Department
Admission: EM | Admit: 2017-12-30 | Discharge: 2017-12-30 | Disposition: A | Discharge status: Home / Self Care | Payer: Medicare HMO | Attending: Emergency Medicine | Admitting: Emergency Medicine

## 2017-12-30 ENCOUNTER — Encounter: Payer: Self-pay | Admitting: Emergency Medicine

## 2017-12-30 DIAGNOSIS — F329 Major depressive disorder, single episode, unspecified: Secondary | ICD-10-CM | POA: Insufficient documentation

## 2017-12-30 DIAGNOSIS — Y92002 Bathroom of unspecified non-institutional (private) residence single-family (private) house as the place of occurrence of the external cause: Secondary | ICD-10-CM | POA: Insufficient documentation

## 2017-12-30 DIAGNOSIS — Y998 Other external cause status: Secondary | ICD-10-CM | POA: Diagnosis not present

## 2017-12-30 DIAGNOSIS — I129 Hypertensive chronic kidney disease with stage 1 through stage 4 chronic kidney disease, or unspecified chronic kidney disease: Secondary | ICD-10-CM | POA: Insufficient documentation

## 2017-12-30 DIAGNOSIS — Z85528 Personal history of other malignant neoplasm of kidney: Secondary | ICD-10-CM | POA: Insufficient documentation

## 2017-12-30 DIAGNOSIS — Z96643 Presence of artificial hip joint, bilateral: Secondary | ICD-10-CM | POA: Insufficient documentation

## 2017-12-30 DIAGNOSIS — T162XXA Foreign body in left ear, initial encounter: Secondary | ICD-10-CM | POA: Diagnosis present

## 2017-12-30 DIAGNOSIS — J449 Chronic obstructive pulmonary disease, unspecified: Secondary | ICD-10-CM | POA: Insufficient documentation

## 2017-12-30 DIAGNOSIS — Z7901 Long term (current) use of anticoagulants: Secondary | ICD-10-CM | POA: Diagnosis not present

## 2017-12-30 DIAGNOSIS — Z7982 Long term (current) use of aspirin: Secondary | ICD-10-CM | POA: Insufficient documentation

## 2017-12-30 DIAGNOSIS — H6122 Impacted cerumen, left ear: Secondary | ICD-10-CM | POA: Diagnosis not present

## 2017-12-30 DIAGNOSIS — E1122 Type 2 diabetes mellitus with diabetic chronic kidney disease: Secondary | ICD-10-CM | POA: Insufficient documentation

## 2017-12-30 DIAGNOSIS — Z794 Long term (current) use of insulin: Secondary | ICD-10-CM | POA: Diagnosis not present

## 2017-12-30 DIAGNOSIS — Z79899 Other long term (current) drug therapy: Secondary | ICD-10-CM | POA: Insufficient documentation

## 2017-12-30 DIAGNOSIS — Y9389 Activity, other specified: Secondary | ICD-10-CM | POA: Diagnosis not present

## 2017-12-30 DIAGNOSIS — X58XXXA Exposure to other specified factors, initial encounter: Secondary | ICD-10-CM | POA: Insufficient documentation

## 2017-12-30 DIAGNOSIS — N183 Chronic kidney disease, stage 3 (moderate): Secondary | ICD-10-CM | POA: Insufficient documentation

## 2017-12-30 DIAGNOSIS — H73892 Other specified disorders of tympanic membrane, left ear: Secondary | ICD-10-CM | POA: Diagnosis not present

## 2017-12-30 NOTE — ED Provider Notes (Signed)
[facility] Emergency Department Provider Note ____________________________________________  Time seen: Approximately 10:14 PM  I have reviewed the triage vital signs and the nursing notes.   HISTORY  Chief Complaint Foreign Body in [place]    HPI Tracy Mcmahon is a 84 y.o. female who presents to the emergency department for removal of the tip of a cotton swab in her left ear.  She states that after getting out of the shower tonight, she was cleaning her ears and when she pulled the cotton swab out, only the stick remained.  She denies ear pain or muffled hearing.  She denies any discharge or drainage from the ear.  Past Medical History:  Diagnosis Date  . Cancer ([place])   . Cataract   . Chronic kidney disease (CKD), stage III (moderate) (HCC)   . Chronic urticaria   . COPD (chronic obstructive pulmonary disease) ([place])   . Depression   . Diabetes mellitus without complication ([place])   . DVT (deep venous thrombosis) (HCC)    left leg  . Hyperlipemia   . Hypertension   . Osteoarthritis   . Osteopenia   . Polymyalgia rheumatica ([place])   . Polyneuropathy   . Renal cancer ([place])   . Renal insufficiency 08/10/2016   Chart indicates CKD stage 3  . Restless leg syndrome   . Vitamin D deficiency   . Vulvar intraepithelial neoplasia with lichen sclerosus     Patient Active Problem List   Diagnosis Date Noted  . Metabolic encephalopathy 09/98/3382  . Generalized weakness 12/19/2016  . Acute on chronic respiratory failure with hypoxia and hypercapnia ([place]) 12/16/2016  . COPD exacerbation ([place]) 12/16/2016  . Community acquired pneumonia 12/16/2016  . Lactic acidosis 12/16/2016  . Sepsis ([place]) 12/16/2016  . PE (pulmonary embolism) 08/11/2016  . COPD (chronic obstructive pulmonary disease) ([place]) 03/29/2015  . Hypertension 03/29/2015  . Diabetes ([place]) 03/29/2015  . Hyperlipidemia 03/29/2015  . Depression 03/29/2015  . Restless leg syndrome 03/29/2015  . DVT  (deep venous thrombosis) ([place]) 03/29/2015  . Chronic kidney disease 03/29/2015    Past Surgical History:  Procedure Laterality Date  . ABDOMINAL HYSTERECTOMY    . APPENDECTOMY    . CATARACT EXTRACTION    . COLONOSCOPY    . JOINT REPLACEMENT Bilateral    hip  . LAMINOTOMY    . NEPHRECTOMY    . TEMPORAL ARTERY BIOPSY / LIGATION    . TONSILLECTOMY      Prior to Admission medications   Medication Sig Start Date End Date Taking? Authorizing Provider  albuterol (PROVENTIL) (2.5 MG/3ML) 0.083% nebulizer solution Take 3 mLs (2.5 mg total) by nebulization every 4 (four) hours. 12/19/16   Theodoro Grist, MD  alendronate (FOSAMAX) 35 MG tablet Take 35 mg by mouth every 7 (seven) days. Take with a full glass of water on an empty stomach.    [provider]  apixaban (ELIQUIS) 5 MG TABS tablet Take 2 tablets (10 mg total) by mouth 2 (two) times daily. 2 tabs bid for 7 days then 1 tab bid Patient taking differently: Take 5 mg by mouth 2 (two) times daily. 2 tabs bid for 7 days then 1 tab bid 08/13/16   Fritzi Mandes, MD  aspirin EC 81 MG tablet Take 81 mg by mouth daily.    [provider]  atenolol (TENORMIN) 25 MG tablet Take 25 mg by mouth 2 (two) times daily.    [provider]  azithromycin (ZITHROMAX) 500 MG tablet Take 1 tablet (  500 mg total) by mouth daily. 12/20/16   Theodoro Grist, MD  budesonide (PULMICORT) 0.25 MG/2ML nebulizer solution Take 2 mLs (0.25 mg total) by nebulization 2 (two) times daily. 12/19/16   Theodoro Grist, MD  FLUoxetine (PROZAC) 20 MG capsule Take 20 mg by mouth daily.    [provider]  furosemide (LASIX) 20 MG tablet Take 20 mg by mouth daily.    [provider]  gabapentin (NEURONTIN) 300 MG capsule Take 300-600 mg by mouth 3 (three) times daily. 1 capsule by mouth in the morning and 2 at bedtime    [provider]  guaiFENesin (MUCINEX) 600 MG 12 hr tablet Take 1 tablet (600 mg total) by mouth 2 (two) times daily.  12/19/16   Theodoro Grist, MD  insulin aspart (NOVOLOG) 100 UNIT/ML injection Inject 0-20 Units into the skin See admin instructions. Pt uses as needed per sliding scale. Twice daily    [provider]  insulin glargine (LANTUS) 100 UNIT/ML injection Inject 80 Units into the skin daily. 80 units at 1300 given as 40 units in two different sites    [provider]  Multiple Minerals-Vitamins (GNP CAL MAG ZINC +D3 PO) Take 1 tablet by mouth daily.    [provider]  pravastatin (PRAVACHOL) 20 MG tablet Take 20 mg by mouth daily.    [provider]  predniSONE (DELTASONE) 5 MG tablet Take 7.5 mg by mouth daily.     [provider]  predniSONE (STERAPRED UNI-PAK 21 TAB) 10 MG (21) TBPK tablet Take 1 tablet (10 mg total) by mouth daily. Please take 6 pills in the morning on the days 1 and 2, then taper by one pill every 2 days until finished, thank you 12/19/16   Theodoro Grist, MD  tiotropium (SPIRIVA) 18 MCG inhalation capsule Place 1 capsule (18 mcg total) into inhaler and inhale daily. 12/20/16   Theodoro Grist, MD  vitamin B-12 (CYANOCOBALAMIN) 1000 MCG tablet Take 1,000 mcg by mouth daily.    [provider]  Vitamin D, Ergocalciferol, (DRISDOL) 50000 UNITS CAPS capsule Take 50,000 Units by mouth every 7 (seven) days. monday    [provider]    Allergies Fosamax [alendronate sodium] and Wellbutrin [bupropion]  Family History  Problem Relation Age of Onset  . Hypertension Unknown   . Diabetes Unknown     Social History Social History   Tobacco Use  . Smoking status: Never Smoker  . Smokeless tobacco: Never Used  Substance Use Topics  . Alcohol use: No    Alcohol/week: 0.0 oz  . Drug use: No    Review of Systems Constitutional: Negative for fever.  Negative for decreased ability to hear from right or left ear(s). Eyes: Negative for discharge or drainage. ENT:       Negative for otalgia in right or left ear(s).       Negative for rhinorrhea or congestion.      Negative for sore throat.      Positive for foreign body sensation in the left ear. Gastrointestinal: Negative for nausea, vomiting, or diarrhea. Musculoskeletal: Negative for new onset myalgias. Neurological: Negative for dizziness  ____________________________________________   PHYSICAL EXAM:  VITAL SIGNS: ED Triage Vitals  Enc Vitals Group     BP 12/30/17 2131 (!) 174/70     Pulse Rate 12/30/17 2131 79     Resp 12/30/17 2131 20     Temp 12/30/17 2131 98.4 F (36.9 C)     Temp Source 12/30/17 2131 Oral  SpO2 12/30/17 2131 97 %     Weight 12/30/17 2130 185 lb (83.9 kg)     Height 12/30/17 2130 5' (1.524 m)     Head Circumference --      Peak Flow --      Pain Score --      Pain Loc --      Pain Edu? --      Excl. in [place]? --     Constitutional: Chronically ill appearing. Eyes: Conjunctivae are clear without discharge or drainage. Ears:       Right TM appears clear.      Left TM appears partially obstructed by cerumen, no cotton or other foreign body identified on exam. Head: Atraumatic. Nose: No rhinorrhea or sinus pain on percussion. Respiratory: Breath sounds are clear throughout to auscultation.  Neurologic:  Alert and oriented x 4. Skin: Intact and without rash on exposed skin surfaces. ____________________________________________   LABS (all labs ordered are listed, but only abnormal results are displayed)  Labs Reviewed - No data to display ____________________________________________   RADIOLOGY  Not indicated ____________________________________________   PROCEDURES  Procedure(s) performed: Small amount of cerumen removed from the left canal with curette  ____________________________________________   INITIAL IMPRESSION / ASSESSMENT AND PLAN / ED COURSE  84 year old female presenting to the emergency department for what she believed to have been a piece of cotton that remained in her ear after using  a Q-tip tonight.  After removing cerumen, I was able to visualize the entire canal and tympanic membrane.  There was no foreign body.  Reassurance was provided to the patient and her daughter.  She was discharged home and advised to follow-up with her primary care provider for any concerns.  Pertinent labs & imaging results that were available during my care of the patient were reviewed by me and considered in my medical decision making (see chart for details). ____________________________________________   FINAL CLINICAL IMPRESSION(S) / ED DIAGNOSES  Final diagnoses:  Cerumen debris on tympanic membrane of left ear    ED Discharge Orders    None      If controlled substance prescribed during this visit, 12 month history viewed on the [place] prior to issuing an initial prescription for Schedule II or III opiod.   Note:  This document was prepared using Dragon voice recognition software and may include unintentional dictation errors.     Victorino Dike, FNP 12/30/17 [place], [place], MD 12/31/17 [phone]

## 2017-12-30 NOTE — ED Triage Notes (Signed)
Pt to triage via w/c with no distress noted, home O2 in place; st q-tip came off in left ear PTA; denies pain

## 2018-01-14 DIAGNOSIS — R0902 Hypoxemia: Secondary | ICD-10-CM | POA: Diagnosis not present

## 2018-01-14 DIAGNOSIS — M6080 Other myositis, unspecified site: Secondary | ICD-10-CM | POA: Diagnosis not present

## 2018-01-14 DIAGNOSIS — I10 Essential (primary) hypertension: Secondary | ICD-10-CM | POA: Diagnosis not present

## 2018-02-11 DIAGNOSIS — I10 Essential (primary) hypertension: Secondary | ICD-10-CM | POA: Diagnosis not present

## 2018-02-11 DIAGNOSIS — R0902 Hypoxemia: Secondary | ICD-10-CM | POA: Diagnosis not present

## 2018-02-11 DIAGNOSIS — M6080 Other myositis, unspecified site: Secondary | ICD-10-CM | POA: Diagnosis not present

## 2018-02-12 DIAGNOSIS — Z79899 Other long term (current) drug therapy: Secondary | ICD-10-CM | POA: Diagnosis not present

## 2018-02-12 DIAGNOSIS — J439 Emphysema, unspecified: Secondary | ICD-10-CM | POA: Diagnosis not present

## 2018-02-12 DIAGNOSIS — I10 Essential (primary) hypertension: Secondary | ICD-10-CM | POA: Diagnosis not present

## 2018-02-12 DIAGNOSIS — Z794 Long term (current) use of insulin: Secondary | ICD-10-CM | POA: Diagnosis not present

## 2018-02-12 DIAGNOSIS — N183 Chronic kidney disease, stage 3 (moderate): Secondary | ICD-10-CM | POA: Diagnosis not present

## 2018-02-12 DIAGNOSIS — E782 Mixed hyperlipidemia: Secondary | ICD-10-CM | POA: Diagnosis not present

## 2018-02-12 DIAGNOSIS — J9611 Chronic respiratory failure with hypoxia: Secondary | ICD-10-CM | POA: Diagnosis not present

## 2018-02-12 DIAGNOSIS — D631 Anemia in chronic kidney disease: Secondary | ICD-10-CM | POA: Diagnosis not present

## 2018-02-12 DIAGNOSIS — G629 Polyneuropathy, unspecified: Secondary | ICD-10-CM | POA: Diagnosis not present

## 2018-02-12 DIAGNOSIS — E559 Vitamin D deficiency, unspecified: Secondary | ICD-10-CM | POA: Diagnosis not present

## 2018-02-12 DIAGNOSIS — Z0000 Encounter for general adult medical examination without abnormal findings: Secondary | ICD-10-CM | POA: Diagnosis not present

## 2018-02-12 DIAGNOSIS — E1122 Type 2 diabetes mellitus with diabetic chronic kidney disease: Secondary | ICD-10-CM | POA: Diagnosis not present

## 2018-03-09 DIAGNOSIS — E1122 Type 2 diabetes mellitus with diabetic chronic kidney disease: Secondary | ICD-10-CM | POA: Diagnosis not present

## 2018-03-09 DIAGNOSIS — Z794 Long term (current) use of insulin: Secondary | ICD-10-CM | POA: Diagnosis not present

## 2018-03-09 DIAGNOSIS — Z79899 Other long term (current) drug therapy: Secondary | ICD-10-CM | POA: Diagnosis not present

## 2018-03-09 DIAGNOSIS — N183 Chronic kidney disease, stage 3 (moderate): Secondary | ICD-10-CM | POA: Diagnosis not present

## 2018-03-14 DIAGNOSIS — I10 Essential (primary) hypertension: Secondary | ICD-10-CM | POA: Diagnosis not present

## 2018-03-14 DIAGNOSIS — R0902 Hypoxemia: Secondary | ICD-10-CM | POA: Diagnosis not present

## 2018-03-14 DIAGNOSIS — M6080 Other myositis, unspecified site: Secondary | ICD-10-CM | POA: Diagnosis not present

## 2018-03-15 ENCOUNTER — Encounter: Payer: Self-pay | Admitting: Emergency Medicine

## 2018-03-15 ENCOUNTER — Observation Stay
Admission: EM | Admit: 2018-03-15 | Discharge: 2018-03-17 | Disposition: A | Discharge status: Home / Self Care | Payer: Medicare HMO | Attending: Specialist | Admitting: Specialist

## 2018-03-15 ENCOUNTER — Other Ambulatory Visit: Payer: Self-pay

## 2018-03-15 DIAGNOSIS — Z7901 Long term (current) use of anticoagulants: Secondary | ICD-10-CM | POA: Diagnosis not present

## 2018-03-15 DIAGNOSIS — Z7952 Long term (current) use of systemic steroids: Secondary | ICD-10-CM | POA: Diagnosis not present

## 2018-03-15 DIAGNOSIS — E1122 Type 2 diabetes mellitus with diabetic chronic kidney disease: Secondary | ICD-10-CM | POA: Insufficient documentation

## 2018-03-15 DIAGNOSIS — I129 Hypertensive chronic kidney disease with stage 1 through stage 4 chronic kidney disease, or unspecified chronic kidney disease: Secondary | ICD-10-CM | POA: Insufficient documentation

## 2018-03-15 DIAGNOSIS — E11649 Type 2 diabetes mellitus with hypoglycemia without coma: Secondary | ICD-10-CM | POA: Insufficient documentation

## 2018-03-15 DIAGNOSIS — Z86718 Personal history of other venous thrombosis and embolism: Secondary | ICD-10-CM | POA: Diagnosis not present

## 2018-03-15 DIAGNOSIS — M353 Polymyalgia rheumatica: Secondary | ICD-10-CM | POA: Diagnosis not present

## 2018-03-15 DIAGNOSIS — N390 Urinary tract infection, site not specified: Secondary | ICD-10-CM | POA: Diagnosis not present

## 2018-03-15 DIAGNOSIS — Z7982 Long term (current) use of aspirin: Secondary | ICD-10-CM | POA: Insufficient documentation

## 2018-03-15 DIAGNOSIS — G9340 Encephalopathy, unspecified: Secondary | ICD-10-CM | POA: Diagnosis not present

## 2018-03-15 DIAGNOSIS — Z85528 Personal history of other malignant neoplasm of kidney: Secondary | ICD-10-CM | POA: Diagnosis not present

## 2018-03-15 DIAGNOSIS — Z794 Long term (current) use of insulin: Secondary | ICD-10-CM | POA: Diagnosis not present

## 2018-03-15 DIAGNOSIS — Z905 Acquired absence of kidney: Secondary | ICD-10-CM | POA: Insufficient documentation

## 2018-03-15 DIAGNOSIS — Z79899 Other long term (current) drug therapy: Secondary | ICD-10-CM | POA: Diagnosis not present

## 2018-03-15 DIAGNOSIS — N183 Chronic kidney disease, stage 3 unspecified: Secondary | ICD-10-CM | POA: Diagnosis present

## 2018-03-15 DIAGNOSIS — Z7951 Long term (current) use of inhaled steroids: Secondary | ICD-10-CM | POA: Diagnosis not present

## 2018-03-15 DIAGNOSIS — F329 Major depressive disorder, single episode, unspecified: Secondary | ICD-10-CM | POA: Insufficient documentation

## 2018-03-15 DIAGNOSIS — E1140 Type 2 diabetes mellitus with diabetic neuropathy, unspecified: Secondary | ICD-10-CM | POA: Diagnosis not present

## 2018-03-15 DIAGNOSIS — I10 Essential (primary) hypertension: Secondary | ICD-10-CM | POA: Diagnosis not present

## 2018-03-15 DIAGNOSIS — G9341 Metabolic encephalopathy: Secondary | ICD-10-CM | POA: Insufficient documentation

## 2018-03-15 DIAGNOSIS — E785 Hyperlipidemia, unspecified: Secondary | ICD-10-CM | POA: Diagnosis not present

## 2018-03-15 DIAGNOSIS — Z7983 Long term (current) use of bisphosphonates: Secondary | ICD-10-CM | POA: Diagnosis not present

## 2018-03-15 DIAGNOSIS — E119 Type 2 diabetes mellitus without complications: Secondary | ICD-10-CM

## 2018-03-15 DIAGNOSIS — R4182 Altered mental status, unspecified: Secondary | ICD-10-CM | POA: Diagnosis present

## 2018-03-15 DIAGNOSIS — E162 Hypoglycemia, unspecified: Secondary | ICD-10-CM | POA: Diagnosis not present

## 2018-03-15 DIAGNOSIS — J449 Chronic obstructive pulmonary disease, unspecified: Secondary | ICD-10-CM | POA: Diagnosis not present

## 2018-03-15 DIAGNOSIS — G2581 Restless legs syndrome: Secondary | ICD-10-CM | POA: Diagnosis present

## 2018-03-15 DIAGNOSIS — E559 Vitamin D deficiency, unspecified: Secondary | ICD-10-CM | POA: Diagnosis not present

## 2018-03-15 DIAGNOSIS — Z96643 Presence of artificial hip joint, bilateral: Secondary | ICD-10-CM | POA: Diagnosis not present

## 2018-03-15 LAB — CBC WITH DIFFERENTIAL/PLATELET
BASOS ABS: 0 10*3/uL (ref 0–0.1)
BASOS PCT: 0 %
Eosinophils Absolute: 0.1 10*3/uL (ref 0–0.7)
Eosinophils Relative: 1 %
HCT: 34.6 % — ABNORMAL LOW (ref 35.0–47.0)
HEMOGLOBIN: 11.2 g/dL — AB (ref 12.0–16.0)
Lymphocytes Relative: 8 %
Lymphs Abs: 0.9 10*3/uL — ABNORMAL LOW (ref 1.0–3.6)
MCH: 28.4 pg (ref 26.0–34.0)
MCHC: 32.3 g/dL (ref 32.0–36.0)
MCV: 88 fL (ref 80.0–100.0)
Monocytes Absolute: 0.7 10*3/uL (ref 0.2–0.9)
Monocytes Relative: 6 %
NEUTROS ABS: 9.7 10*3/uL — AB (ref 1.4–6.5)
NEUTROS PCT: 85 %
Platelets: 377 10*3/uL (ref 150–440)
RBC: 3.93 MIL/uL (ref 3.80–5.20)
RDW: 16.2 % — ABNORMAL HIGH (ref 11.5–14.5)
WBC: 11.4 10*3/uL — AB (ref 3.6–11.0)

## 2018-03-15 LAB — URINALYSIS, COMPLETE (UACMP) WITH MICROSCOPIC
BILIRUBIN URINE: NEGATIVE
Glucose, UA: NEGATIVE mg/dL
HGB URINE DIPSTICK: NEGATIVE
KETONES UR: NEGATIVE mg/dL
Leukocytes, UA: NEGATIVE
NITRITE: POSITIVE — AB
PH: 7 (ref 5.0–8.0)
Protein, ur: NEGATIVE mg/dL
Specific Gravity, Urine: 1.006 (ref 1.005–1.030)

## 2018-03-15 LAB — BASIC METABOLIC PANEL
ANION GAP: 9 (ref 5–15)
BUN: 19 mg/dL (ref 6–20)
CALCIUM: 9.4 mg/dL (ref 8.9–10.3)
CO2: 37 mmol/L — AB (ref 22–32)
Chloride: 91 mmol/L — ABNORMAL LOW (ref 101–111)
Creatinine, Ser: 1.08 mg/dL — ABNORMAL HIGH (ref 0.44–1.00)
GFR calc non Af Amer: 45 mL/min — ABNORMAL LOW (ref >60)
GFR, EST AFRICAN AMERICAN: 52 mL/min — AB (ref >60)
Glucose, Bld: 147 mg/dL — ABNORMAL HIGH (ref 65–99)
Potassium: 3.3 mmol/L — ABNORMAL LOW (ref 3.5–5.1)
SODIUM: 137 mmol/L (ref 135–145)

## 2018-03-15 LAB — GLUCOSE, CAPILLARY
GLUCOSE-CAPILLARY: 98 mg/dL (ref 65–99)
GLUCOSE-CAPILLARY: 87 mg/dL (ref 65–99)
GLUCOSE-CAPILLARY: 159 mg/dL — AB (ref 65–99)
Glucose-Capillary: 93 mg/dL (ref 65–99)

## 2018-03-15 MED ORDER — PRAVASTATIN SODIUM 20 MG PO TABS
20.0000 mg | ORAL_TABLET | Freq: Every day | ORAL | Status: DC
  Administered 2018-03-16 – 2018-03-17 (×2): 20 mg via ORAL
  Filled 2018-03-15 (×2): qty 1

## 2018-03-15 MED ORDER — TIOTROPIUM BROMIDE MONOHYDRATE 18 MCG IN CAPS
18.0000 ug | ORAL_CAPSULE | Freq: Every day | RESPIRATORY_TRACT | Status: DC
  Administered 2018-03-16 – 2018-03-17 (×2): 18 ug via RESPIRATORY_TRACT
  Filled 2018-03-15: qty 5

## 2018-03-15 MED ORDER — ONDANSETRON HCL 4 MG PO TABS: 4.0000 mg | ORAL_TABLET | Freq: Four times a day (QID) | ORAL | Status: DC | PRN

## 2018-03-15 MED ORDER — DEXTROSE 5 % IV SOLN
Freq: Once | INTRAVENOUS | Status: AC
  Administered 2018-03-15: 16:00:00 via INTRAVENOUS

## 2018-03-15 MED ORDER — ONDANSETRON HCL 4 MG/2ML IJ SOLN: 4.0000 mg | Freq: Four times a day (QID) | INTRAMUSCULAR | Status: DC | PRN

## 2018-03-15 MED ORDER — ASPIRIN EC 81 MG PO TBEC
81.0000 mg | DELAYED_RELEASE_TABLET | Freq: Every day | ORAL | Status: DC
  Administered 2018-03-16 – 2018-03-17 (×2): 81 mg via ORAL
  Filled 2018-03-15 (×2): qty 1

## 2018-03-15 MED ORDER — ACETAMINOPHEN 650 MG RE SUPP: 650.0000 mg | Freq: Four times a day (QID) | RECTAL | Status: DC | PRN

## 2018-03-15 MED ORDER — ATENOLOL 25 MG PO TABS
25.0000 mg | ORAL_TABLET | Freq: Two times a day (BID) | ORAL | Status: DC
  Administered 2018-03-16 – 2018-03-17 (×3): 25 mg via ORAL
  Filled 2018-03-15 (×5): qty 1

## 2018-03-15 MED ORDER — BUDESONIDE 0.25 MG/2ML IN SUSP
0.2500 mg | Freq: Two times a day (BID) | RESPIRATORY_TRACT | Status: DC
  Administered 2018-03-16 – 2018-03-17 (×3): 0.25 mg via RESPIRATORY_TRACT
  Filled 2018-03-15 (×3): qty 2

## 2018-03-15 MED ORDER — SODIUM CHLORIDE 0.9 % IV SOLN
1.0000 g | Freq: Once | INTRAVENOUS | Status: AC
  Administered 2018-03-15: 1 g via INTRAVENOUS
  Filled 2018-03-15: qty 10

## 2018-03-15 MED ORDER — ACETAMINOPHEN 325 MG PO TABS: 650.0000 mg | ORAL_TABLET | Freq: Four times a day (QID) | ORAL | Status: DC | PRN

## 2018-03-15 MED ORDER — POLYVINYL ALCOHOL 1.4 % OP SOLN
1.0000 [drp] | OPHTHALMIC | Status: DC | PRN
  Administered 2018-03-16: 1 [drp] via OPHTHALMIC
  Filled 2018-03-15: qty 15

## 2018-03-15 MED ORDER — PREDNISONE 5 MG PO TABS
7.5000 mg | ORAL_TABLET | Freq: Every day | ORAL | Status: DC
  Administered 2018-03-16 – 2018-03-17 (×2): 7.5 mg via ORAL
  Filled 2018-03-15 (×3): qty 1

## 2018-03-15 MED ORDER — FLUOXETINE HCL 20 MG PO CAPS
20.0000 mg | ORAL_CAPSULE | Freq: Every day | ORAL | Status: DC
  Administered 2018-03-16 – 2018-03-17 (×2): 20 mg via ORAL
  Filled 2018-03-15 (×3): qty 1

## 2018-03-15 MED ORDER — FUROSEMIDE 20 MG PO TABS
20.0000 mg | ORAL_TABLET | Freq: Every day | ORAL | Status: DC
  Administered 2018-03-16 – 2018-03-17 (×2): 20 mg via ORAL
  Filled 2018-03-15 (×2): qty 1

## 2018-03-15 MED ORDER — INSULIN ASPART 100 UNIT/ML ~~LOC~~ SOLN
0.0000 [IU] | Freq: Every day | SUBCUTANEOUS | Status: DC
Start: 2018-03-15 — End: 2018-03-17
  Administered 2018-03-16: 2 [IU] via SUBCUTANEOUS
  Filled 2018-03-15: qty 1

## 2018-03-15 MED ORDER — SODIUM CHLORIDE 0.9 % IV SOLN
1.0000 g | INTRAVENOUS | Status: DC
  Administered 2018-03-16: 1 g via INTRAVENOUS
  Filled 2018-03-15 (×2): qty 10

## 2018-03-15 MED ORDER — DEXTROSE 5 % IV SOLN
Freq: Once | INTRAVENOUS | Status: AC
  Administered 2018-03-15: 20:00:00 via INTRAVENOUS

## 2018-03-15 MED ORDER — INSULIN ASPART 100 UNIT/ML ~~LOC~~ SOLN
0.0000 [IU] | Freq: Three times a day (TID) | SUBCUTANEOUS | Status: DC
  Administered 2018-03-16: 2 [IU] via SUBCUTANEOUS
  Administered 2018-03-16: 5 [IU] via SUBCUTANEOUS
  Administered 2018-03-16 – 2018-03-17 (×2): 2 [IU] via SUBCUTANEOUS
  Filled 2018-03-15 (×4): qty 1

## 2018-03-15 MED ORDER — APIXABAN 5 MG PO TABS
5.0000 mg | ORAL_TABLET | Freq: Two times a day (BID) | ORAL | Status: DC
  Administered 2018-03-15 – 2018-03-17 (×4): 5 mg via ORAL
  Filled 2018-03-15 (×4): qty 1

## 2018-03-15 NOTE — ED Notes (Signed)
Rainbow tubes and lactic drawn from pt and sent with a save label.

## 2018-03-15 NOTE — ED Provider Notes (Signed)
[facility] Emergency Department Provider Note  ____________________________________   I have reviewed the triage vital signs and the nursing notes.   HISTORY  Chief Complaint Decreased mental status  History limited by: Poor historian, some history obtained from family   HPI Tracy Mcmahon is a 84 y.o. female who presents to the emergency department today because of concern for decreased responsiveness.  Apparently the patient was found today in a less responsive state than normal.  She was sitting in her chair and was tracking family but was not responding verbally to them.  Family does not think she ate or drink anything.  She does have a history of diabetes and they are unsure if she got her insulin shots.  Patient has not been complaining of any pain recently.  No fevers.  Per medical record review patient has a history of DM.  Past Medical History:  Diagnosis Date  . Cancer ([place])   . Cataract   . Chronic kidney disease (CKD), stage III (moderate) (HCC)   . Chronic urticaria   . COPD (chronic obstructive pulmonary disease) ([place])   . Depression   . Diabetes mellitus without complication ([place])   . DVT (deep venous thrombosis) (HCC)    left leg  . Hyperlipemia   . Hypertension   . Osteoarthritis   . Osteopenia   . Polymyalgia rheumatica ([place])   . Polyneuropathy   . Renal cancer ([place])   . Renal insufficiency 08/10/2016   Chart indicates CKD stage 3  . Restless leg syndrome   . Vitamin D deficiency   . Vulvar intraepithelial neoplasia with lichen sclerosus     Patient Active Problem List   Diagnosis Date Noted  . Metabolic encephalopathy 92/11/69  . Generalized weakness 12/19/2016  . Acute on chronic respiratory failure with hypoxia and hypercapnia ([place]) 12/16/2016  . COPD exacerbation ([place]) 12/16/2016  . Community acquired pneumonia 12/16/2016  . Lactic acidosis 12/16/2016  . Sepsis ([place]) 12/16/2016  . PE (pulmonary embolism) 08/11/2016   . COPD (chronic obstructive pulmonary disease) ([place]) 03/29/2015  . Hypertension 03/29/2015  . Diabetes ([place]) 03/29/2015  . Hyperlipidemia 03/29/2015  . Depression 03/29/2015  . Restless leg syndrome 03/29/2015  . DVT (deep venous thrombosis) ([place]) 03/29/2015  . Chronic kidney disease 03/29/2015    Past Surgical History:  Procedure Laterality Date  . ABDOMINAL HYSTERECTOMY    . APPENDECTOMY    . CATARACT EXTRACTION    . COLONOSCOPY    . JOINT REPLACEMENT Bilateral    hip  . LAMINOTOMY    . NEPHRECTOMY    . TEMPORAL ARTERY BIOPSY / LIGATION    . TONSILLECTOMY      Prior to Admission medications   Medication Sig Start Date End Date Taking? Authorizing Provider  albuterol (PROVENTIL) (2.5 MG/3ML) 0.083% nebulizer solution Take 3 mLs (2.5 mg total) by nebulization every 4 (four) hours. 12/19/16   Theodoro Grist, MD  alendronate (FOSAMAX) 35 MG tablet Take 35 mg by mouth every 7 (seven) days. Take with a full glass of water on an empty stomach.    [provider]  apixaban (ELIQUIS) 5 MG TABS tablet Take 2 tablets (10 mg total) by mouth 2 (two) times daily. 2 tabs bid for 7 days then 1 tab bid Patient taking differently: Take 5 mg by mouth 2 (two) times daily. 2 tabs bid for 7 days then 1 tab bid 08/13/16   Fritzi Mandes, MD  aspirin EC 81 MG tablet Take 81 mg by mouth  daily.    [provider]  atenolol (TENORMIN) 25 MG tablet Take 25 mg by mouth 2 (two) times daily.    [provider]  azithromycin (ZITHROMAX) 500 MG tablet Take 1 tablet (500 mg total) by mouth daily. 12/20/16   Theodoro Grist, MD  budesonide (PULMICORT) 0.25 MG/2ML nebulizer solution Take 2 mLs (0.25 mg total) by nebulization 2 (two) times daily. 12/19/16   Theodoro Grist, MD  FLUoxetine (PROZAC) 20 MG capsule Take 20 mg by mouth daily.    [provider]  furosemide (LASIX) 20 MG tablet Take 20 mg by mouth daily.    [provider]  gabapentin (NEURONTIN) 300 MG capsule Take  300-600 mg by mouth 3 (three) times daily. 1 capsule by mouth in the morning and 2 at bedtime    [provider]  guaiFENesin (MUCINEX) 600 MG 12 hr tablet Take 1 tablet (600 mg total) by mouth 2 (two) times daily. 12/19/16   Theodoro Grist, MD  insulin aspart (NOVOLOG) 100 UNIT/ML injection Inject 0-20 Units into the skin See admin instructions. Pt uses as needed per sliding scale. Twice daily    [provider]  insulin glargine (LANTUS) 100 UNIT/ML injection Inject 80 Units into the skin daily. 80 units at 1300 given as 40 units in two different sites    [provider]  Multiple Minerals-Vitamins (GNP CAL MAG ZINC +D3 PO) Take 1 tablet by mouth daily.    [provider]  pravastatin (PRAVACHOL) 20 MG tablet Take 20 mg by mouth daily.    [provider]  predniSONE (DELTASONE) 5 MG tablet Take 7.5 mg by mouth daily.     [provider]  predniSONE (STERAPRED UNI-PAK 21 TAB) 10 MG (21) TBPK tablet Take 1 tablet (10 mg total) by mouth daily. Please take 6 pills in the morning on the days 1 and 2, then taper by one pill every 2 days until finished, thank you 12/19/16   Theodoro Grist, MD  tiotropium (SPIRIVA) 18 MCG inhalation capsule Place 1 capsule (18 mcg total) into inhaler and inhale daily. 12/20/16   Theodoro Grist, MD  vitamin B-12 (CYANOCOBALAMIN) 1000 MCG tablet Take 1,000 mcg by mouth daily.    [provider]  Vitamin D, Ergocalciferol, (DRISDOL) 50000 UNITS CAPS capsule Take 50,000 Units by mouth every 7 (seven) days. monday    [provider]    Allergies Fosamax [alendronate sodium] and Wellbutrin [bupropion]  Family History  Problem Relation Age of Onset  . Hypertension Unknown   . Diabetes Unknown     Social History Social History   Tobacco Use  . Smoking status: Never Smoker  . Smokeless tobacco: Never Used  Substance Use Topics  . Alcohol use: No    Alcohol/week: 0.0 oz  . Drug use: No    Review  of Systems Constitutional: No fever/chills Eyes: No visual changes. ENT: No sore throat. Cardiovascular: Denies chest pain. Respiratory: Denies shortness of breath. Gastrointestinal: No abdominal pain.  No nausea, no vomiting.  No diarrhea.   Genitourinary: Negative for dysuria. Musculoskeletal: Negative for back pain. Skin: Negative for rash. Neurological: Negative for headaches, focal weakness or numbness.  ____________________________________________   PHYSICAL EXAM:  VITAL SIGNS: ED Triage Vitals  Enc Vitals Group     BP --      Pulse Rate 03/15/18 1402 (!) 59     Resp 03/15/18 1402 18     Temp 03/15/18 1402 (!) 97.3 F (36.3 C)  Temp Source 03/15/18 1402 Oral     SpO2 03/15/18 1402 100 %     Weight 03/15/18 1403 200 lb (90.7 kg)     Height 03/15/18 1403 5' (1.524 m)     Head Circumference --      Peak Flow --      Pain Score 03/15/18 1403 0    Constitutional: Awake and alert. Not completely oriented. Eyes: Conjunctivae are normal.  ENT   Head: Normocephalic and atraumatic.   Nose: No congestion/rhinnorhea.   Mouth/Throat: Mucous membranes are moist.   Neck: No stridor. Hematological/Lymphatic/Immunilogical: No cervical lymphadenopathy. Cardiovascular: Normal rate, regular rhythm.  No murmurs, rubs, or gallops.  Respiratory: Normal respiratory effort without tachypnea nor retractions. Breath sounds are clear and equal bilaterally. No wheezes/rales/rhonchi. Gastrointestinal: Soft and non tender. No rebound. No guarding.  Genitourinary: Deferred Musculoskeletal: Normal range of motion in all extremities. No lower extremity edema. Neurologic:  Normal speech and language. No gross focal neurologic deficits are appreciated.  Skin:  Skin is warm, dry and intact. No rash noted. Psychiatric: Mood and affect are normal. Speech and behavior are normal. Patient exhibits appropriate insight and judgment.  ____________________________________________     LABS (pertinent positives/negatives)  CBC wbc 11.4, hgb 11.2, plt 377 UA positive nitrite, many bacteria BMP na 137, k 3.3, glu 147, cr 1.08  ____________________________________________   EKG  None  ____________________________________________    RADIOLOGY  None  ____________________________________________   PROCEDURES  Procedures  ____________________________________________   INITIAL IMPRESSION / ASSESSMENT AND PLAN / ED COURSE  Pertinent labs & imaging results that were available during my care of the patient were reviewed by me and considered in my medical decision making (see chart for details).  Patient presented to the emergency department today because of concerns for decreased responsiveness in the setting of low blood sugar.  Etiology of the low blood sugar could be due to the fact that she has not eaten or drinking anything today and is unclear if she took her insulin.  Additionally would have concerns for possible infection.  Urine is positive for nitrate and many bacteria.  At this time think this likely is the source of the infection.  Will plan on treating with IV antibiotics here in the emergency department.  Patient was given D5 as well as allowed to eat and drink however when D5 was DC'd the patient continued to have relatively low blood sugar for her.  Will plan on admission for further work-up and management.   ____________________________________________   FINAL CLINICAL IMPRESSION(S) / ED DIAGNOSES  Final diagnoses:  Lower urinary tract infectious disease  Hypoglycemia     Note: This dictation was prepared with Dragon dictation. Any transcriptional errors that result from this process are unintentional     Nance Pear, MD 03/15/18 2049

## 2018-03-15 NOTE — Progress Notes (Signed)
Pharmacy Antibiotic Note  Tracy Mcmahon is a 84 y.o. female admitted on 03/15/2018 with UTI.  Pharmacy has been consulted for ceftriaxone dosing.  Plan: ceftriaxone 1gm iv q24h   Height: 5' (152.4 cm) Weight: 200 lb (90.7 kg) IBW/kg (Calculated) : 45.5  Temp (24hrs), Avg:97.3 F (36.3 C), Min:97.3 F (36.3 C), Max:97.3 F (36.3 C)  Recent Labs  Lab 03/15/18 1413  WBC 11.4*  CREATININE 1.08*    Estimated Creatinine Clearance: 37.5 mL/min (A) (by C-G formula based on SCr of 1.08 mg/dL (H)).    Allergies  Allergen Reactions  . Fosamax [Alendronate Sodium] Other (See Comments)    Reaction:  Made pt "very sick"  . Wellbutrin [Bupropion] Other (See Comments)    Reaction:  Unknown    Antimicrobials this admission: Anti-infectives (From admission, onward)   Start     Dose/Rate Route Frequency Ordered Stop   03/16/18 2000  cefTRIAXone (ROCEPHIN) 1 g in sodium chloride 0.9 % 100 mL IVPB     1 g 200 mL/hr over 30 Minutes Intravenous Every 24 hours 03/15/18 2119     03/15/18 1815  cefTRIAXone (ROCEPHIN) 1 g in sodium chloride 0.9 % 100 mL IVPB     1 g 200 mL/hr over 30 Minutes Intravenous  Once 03/15/18 1811 03/15/18 1927       Microbiology results: No results found for this or any previous visit (from the past 240 hour(s)).  Thank you for allowing pharmacy to be a part of this patient's care.  Donna Christen Jabre Heo 03/15/2018 9:19 PM

## 2018-03-15 NOTE — H&P (Signed)
[facility] [name]    MR#:  [id number]  DATE OF BIRTH:  [birth year]  DATE OF ADMISSION:  03/15/2018  PRIMARY CARE PHYSICIAN: [name], MD   REQUESTING/REFERRING PHYSICIAN: Archie Balboa, MD  CHIEF COMPLAINT:   Chief Complaint  Patient presents with  . Dysuria    HISTORY OF PRESENT ILLNESS:  Tracy Mcmahon  is a 84 y.o. female who presents with alteration in mental status.  Patient's family states that she had an episode today where she was less responsive, and seemed confused.  Here in the ED work-up showed initially nitrite positive UA indicative of UTI.  Patient's other work-up is largely stable or within normal limits.  Hospitalist called for admission  PAST MEDICAL HISTORY:   Past Medical History:  Diagnosis Date  . Cancer ([place])   . Cataract   . Chronic kidney disease (CKD), stage III (moderate) (HCC)   . Chronic urticaria   . COPD (chronic obstructive pulmonary disease) ([place])   . Depression   . Diabetes mellitus without complication ([place])   . DVT (deep venous thrombosis) (HCC)    left leg  . Hyperlipemia   . Hypertension   . Osteoarthritis   . Osteopenia   . Polymyalgia rheumatica ([place])   . Polyneuropathy   . Renal cancer ([place])   . Renal insufficiency 08/10/2016   Chart indicates CKD stage 3  . Restless leg syndrome   . Vitamin D deficiency   . Vulvar intraepithelial neoplasia with lichen sclerosus      PAST SURGICAL HISTORY:   Past Surgical History:  Procedure Laterality Date  . ABDOMINAL HYSTERECTOMY    . APPENDECTOMY    . CATARACT EXTRACTION    . COLONOSCOPY    . JOINT REPLACEMENT Bilateral    hip  . LAMINOTOMY    . NEPHRECTOMY    . TEMPORAL ARTERY BIOPSY / LIGATION    . TONSILLECTOMY       SOCIAL HISTORY:   Social History   Tobacco Use  . Smoking status: Never Smoker  . Smokeless tobacco: Never Used  Substance Use Topics  . Alcohol use: No    Alcohol/week: 0.0 oz      FAMILY HISTORY:   Family History  Problem Relation Age of Onset  . Hypertension Unknown   . Diabetes Unknown      DRUG ALLERGIES:   Allergies  Allergen Reactions  . Fosamax [Alendronate Sodium] Other (See Comments)    Reaction:  Made pt "very sick"  . Wellbutrin [Bupropion] Other (See Comments)    Reaction:  Unknown    MEDICATIONS AT HOME:   Prior to Admission medications   Medication Sig Start Date End Date Taking? Authorizing Provider  albuterol (PROVENTIL) (2.5 MG/3ML) 0.083% nebulizer solution Take 3 mLs (2.5 mg total) by nebulization every 4 (four) hours. 12/19/16   Theodoro Grist, MD  alendronate (FOSAMAX) 35 MG tablet Take 35 mg by mouth every 7 (seven) days. Take with a full glass of water on an empty stomach.    [provider]  apixaban (ELIQUIS) 5 MG TABS tablet Take 2 tablets (10 mg total) by mouth 2 (two) times daily. 2 tabs bid for 7 days then 1 tab bid Patient taking differently: Take 5 mg by mouth 2 (two) times daily. 2 tabs bid for 7 days then 1 tab bid 08/13/16   Fritzi Mandes, MD  aspirin EC 81 MG tablet Take 81 mg by mouth daily.  [provider]  atenolol (TENORMIN) 25 MG tablet Take 25 mg by mouth 2 (two) times daily.    [provider]  azithromycin (ZITHROMAX) 500 MG tablet Take 1 tablet (500 mg total) by mouth daily. 12/20/16   Theodoro Grist, MD  budesonide (PULMICORT) 0.25 MG/2ML nebulizer solution Take 2 mLs (0.25 mg total) by nebulization 2 (two) times daily. 12/19/16   Theodoro Grist, MD  FLUoxetine (PROZAC) 20 MG capsule Take 20 mg by mouth daily.    [provider]  furosemide (LASIX) 20 MG tablet Take 20 mg by mouth daily.    [provider]  gabapentin (NEURONTIN) 300 MG capsule Take 300-600 mg by mouth 3 (three) times daily. 1 capsule by mouth in the morning and 2 at bedtime    [provider]  guaiFENesin (MUCINEX) 600 MG 12 hr tablet Take 1 tablet (600 mg total) by mouth 2 (two) times  daily. 12/19/16   Theodoro Grist, MD  insulin aspart (NOVOLOG) 100 UNIT/ML injection Inject 0-20 Units into the skin See admin instructions. Pt uses as needed per sliding scale. Twice daily    [provider]  insulin glargine (LANTUS) 100 UNIT/ML injection Inject 80 Units into the skin daily. 80 units at 1300 given as 40 units in two different sites    [provider]  Multiple Minerals-Vitamins (GNP CAL MAG ZINC +D3 PO) Take 1 tablet by mouth daily.    [provider]  pravastatin (PRAVACHOL) 20 MG tablet Take 20 mg by mouth daily.    [provider]  predniSONE (DELTASONE) 5 MG tablet Take 7.5 mg by mouth daily.     [provider]  predniSONE (STERAPRED UNI-PAK 21 TAB) 10 MG (21) TBPK tablet Take 1 tablet (10 mg total) by mouth daily. Please take 6 pills in the morning on the days 1 and 2, then taper by one pill every 2 days until finished, thank you 12/19/16   Theodoro Grist, MD  tiotropium (SPIRIVA) 18 MCG inhalation capsule Place 1 capsule (18 mcg total) into inhaler and inhale daily. 12/20/16   Theodoro Grist, MD  vitamin B-12 (CYANOCOBALAMIN) 1000 MCG tablet Take 1,000 mcg by mouth daily.    [provider]  Vitamin D, Ergocalciferol, (DRISDOL) 50000 UNITS CAPS capsule Take 50,000 Units by mouth every 7 (seven) days. monday    [provider]    REVIEW OF SYSTEMS:  Review of Systems  Constitutional: Negative for chills, fever, malaise/fatigue and weight loss.  HENT: Negative for ear pain, hearing loss and tinnitus.   Eyes: Negative for blurred vision, double vision, pain and redness.  Respiratory: Negative for cough, hemoptysis and shortness of breath.   Cardiovascular: Negative for chest pain, palpitations, orthopnea and leg swelling.  Gastrointestinal: Negative for abdominal pain, constipation, diarrhea, nausea and vomiting.  Genitourinary: Positive for dysuria. Negative for frequency and hematuria.  Musculoskeletal: Negative  for back pain, joint pain and neck pain.  Skin:       No acne, rash, or lesions  Neurological: Negative for dizziness, tremors, focal weakness and weakness.       Confusion  Endo/Heme/Allergies: Negative for polydipsia. Does not bruise/bleed easily.  Psychiatric/Behavioral: Negative for depression. The patient is not nervous/anxious and does not have insomnia.      VITAL SIGNS:   Vitals:   03/15/18 1800 03/15/18 1830 03/15/18 1900 03/15/18 1929  BP: (!) 154/61 (!) 151/59 (!) 140/49 (!) 140/49  Pulse: 60 61 65 75  Resp: 18  18 18   Temp:  TempSrc:      SpO2: 99% 100% 99% 99%  Weight:      Height:       Wt Readings from Last 3 Encounters:  03/15/18 90.7 kg (200 lb)  12/30/17 83.9 kg (185 lb)  12/17/16 83.3 kg (183 lb 9.6 oz)    PHYSICAL EXAMINATION:  Physical Exam  Vitals reviewed. Constitutional: She is oriented to person, place, and time. She appears well-developed and well-nourished. No distress.  HENT:  Head: Normocephalic and atraumatic.  Mouth/Throat: Oropharynx is clear and moist.  Eyes: Pupils are equal, round, and reactive to light. Conjunctivae and EOM are normal. No scleral icterus.  Neck: Normal range of motion. Neck supple. No JVD present. No thyromegaly present.  Cardiovascular: Normal rate, regular rhythm and intact distal pulses. Exam reveals no gallop and no friction rub.  No murmur heard. Respiratory: Effort normal and breath sounds normal. No respiratory distress. She has no wheezes. She has no rales.  GI: Soft. Bowel sounds are normal. She exhibits no distension. There is no tenderness.  Musculoskeletal: Normal range of motion. She exhibits no edema.  No arthritis, no gout  Lymphadenopathy:    She has no cervical adenopathy.  Neurological: She is alert and oriented to person, place, and time. No cranial nerve deficit.  No dysarthria, no aphasia  Skin: Skin is warm and dry. No rash noted. No erythema.  Psychiatric: She has a normal mood and affect.  Her behavior is normal. Judgment and thought content normal.    LABORATORY PANEL:   CBC Recent Labs  Lab 03/15/18 1413  WBC 11.4*  HGB 11.2*  HCT 34.6*  PLT 377   ------------------------------------------------------------------------------------------------------------------  Chemistries  Recent Labs  Lab 03/15/18 1413  NA 137  K 3.3*  CL 91*  CO2 37*  GLUCOSE 147*  BUN 19  CREATININE 1.08*  CALCIUM 9.4   ------------------------------------------------------------------------------------------------------------------  Cardiac Enzymes No results for input(s): TROPONINI in the last 168 hours. ------------------------------------------------------------------------------------------------------------------  RADIOLOGY:  No results found.  EKG:   Orders placed or performed during the hospital encounter of 03/29/15  . EKG 12-Lead  . EKG 12-Lead  . EKG    IMPRESSION AND PLAN:  Principal Problem:   UTI (urinary tract infection) -nitrite positive UA, antibiotics started, culture sent Active Problems:   Acute encephalopathy -due to UTI as above, monitor for improvement with resolution of infection   COPD (chronic obstructive pulmonary disease) (HCC) -home dose inhalers   Hypertension -stable, continue home dose antihypertensives   Diabetes (HCC) -sliding scale insulin with corresponding glucose checks   Hyperlipidemia -Home dose antilipid   CKD (chronic kidney disease), stage III (HCC) -at baseline, avoid nephrotoxins and monitor  Chart review performed and case discussed with ED provider. Labs, imaging and/or ECG reviewed by provider and discussed with patient/family. Management plans discussed with the patient and/or family.  DVT PROPHYLAXIS: Systemic anticoagulation  GI PROPHYLAXIS: None  ADMISSION STATUS: Inpatient  CODE STATUS: Full Code Status History    Date Active Date Inactive Code Status Order ID Comments User Context   12/17/2016 0125 12/19/2016  1856 Full Code [id number]  Theodoro Grist, MD Inpatient   08/11/2016 0324 08/11/2016 0937 Full Code [id number]  Lance Coon, MD Inpatient   03/29/2015 2115 03/30/2015 1555 Full Code [id number]  Nicholes Mango, MD Inpatient    Advance Directive Documentation     Most Recent Value  Type of Advance Directive  Healthcare Power of Attorney  Pre-existing out of facility DNR order (yellow form or pink MOST form)  -  "  MOST" Form in Place?  -      TOTAL TIME TAKING CARE OF THIS PATIENT: 45 minutes.   Bethany Cumming [place] 03/15/2018, 8:47 PM  [facility]  Office  [phone]  CC: Primary care physician; [name], MD  Note:  This document was prepared using Dragon voice recognition software and may include unintentional dictation errors.

## 2018-03-15 NOTE — ED Triage Notes (Signed)
Pt arrives via [facility] from home. Pt was alert but unresponsive in her chair at home with a blood sugar of 37. EMS promptly started a line and administered an amp of D50. Sugar increased to 250 and was 290 right before leaving the ambulance to come to ED. Pt c/o vaginal pain and burning when she urinates. Per EMS, strong urine odor in the house. Pt alert and oriented to self, location. Disoriented to situation. Strong smell of odor present at this time.

## 2018-03-15 NOTE — ED Notes (Signed)
Family giving pt crackers; reports CBG 160s -300s

## 2018-03-16 DIAGNOSIS — J449 Chronic obstructive pulmonary disease, unspecified: Secondary | ICD-10-CM | POA: Diagnosis not present

## 2018-03-16 DIAGNOSIS — G9340 Encephalopathy, unspecified: Secondary | ICD-10-CM | POA: Diagnosis not present

## 2018-03-16 DIAGNOSIS — N183 Chronic kidney disease, stage 3 (moderate): Secondary | ICD-10-CM | POA: Diagnosis not present

## 2018-03-16 DIAGNOSIS — N390 Urinary tract infection, site not specified: Secondary | ICD-10-CM | POA: Diagnosis not present

## 2018-03-16 LAB — CBC
HCT: 32 % — ABNORMAL LOW (ref 35.0–47.0)
HEMOGLOBIN: 10.4 g/dL — AB (ref 12.0–16.0)
MCH: 28.5 pg (ref 26.0–34.0)
MCHC: 32.4 g/dL (ref 32.0–36.0)
MCV: 88 fL (ref 80.0–100.0)
Platelets: 349 10*3/uL (ref 150–440)
RBC: 3.64 MIL/uL — AB (ref 3.80–5.20)
RDW: 16.7 % — ABNORMAL HIGH (ref 11.5–14.5)
WBC: 12.8 10*3/uL — AB (ref 3.6–11.0)

## 2018-03-16 LAB — BASIC METABOLIC PANEL
ANION GAP: 9 (ref 5–15)
BUN: 21 mg/dL — ABNORMAL HIGH (ref 6–20)
CALCIUM: 8.6 mg/dL — AB (ref 8.9–10.3)
CO2: 34 mmol/L — ABNORMAL HIGH (ref 22–32)
Chloride: 91 mmol/L — ABNORMAL LOW (ref 101–111)
Creatinine, Ser: 1.21 mg/dL — ABNORMAL HIGH (ref 0.44–1.00)
GFR, EST AFRICAN AMERICAN: 46 mL/min — AB (ref >60)
GFR, EST NON AFRICAN AMERICAN: 39 mL/min — AB (ref >60)
Glucose, Bld: 251 mg/dL — ABNORMAL HIGH (ref 65–99)
Potassium: 3.4 mmol/L — ABNORMAL LOW (ref 3.5–5.1)
SODIUM: 134 mmol/L — AB (ref 135–145)

## 2018-03-16 LAB — GLUCOSE, CAPILLARY
GLUCOSE-CAPILLARY: 266 mg/dL — AB (ref 65–99)
GLUCOSE-CAPILLARY: 160 mg/dL — AB (ref 65–99)
GLUCOSE-CAPILLARY: 256 mg/dL — AB (ref 65–99)
GLUCOSE-CAPILLARY: 233 mg/dL — AB (ref 65–99)
Glucose-Capillary: 193 mg/dL — ABNORMAL HIGH (ref 65–99)

## 2018-03-16 MED ORDER — INSULIN GLARGINE 100 UNIT/ML ~~LOC~~ SOLN
40.0000 [IU] | Freq: Every day | SUBCUTANEOUS | Status: DC
  Administered 2018-03-16: 40 [IU] via SUBCUTANEOUS
  Filled 2018-03-16 (×2): qty 0.4

## 2018-03-16 MED ORDER — SODIUM CHLORIDE 0.9% FLUSH
3.0000 mL | Freq: Three times a day (TID) | INTRAVENOUS | Status: DC
  Administered 2018-03-16: 3 mL via INTRAVENOUS

## 2018-03-16 MED ORDER — SODIUM CHLORIDE 0.9 % IV SOLN
INTRAVENOUS | Status: DC
  Administered 2018-03-16 (×2): via INTRAVENOUS

## 2018-03-16 MED ORDER — ORAL CARE MOUTH RINSE
15.0000 mL | Freq: Two times a day (BID) | OROMUCOSAL | Status: DC
  Administered 2018-03-16: 15 mL via OROMUCOSAL

## 2018-03-16 NOTE — Progress Notes (Signed)
Dr. Marcille Blanco notified of expired IVF order; requested a CBG at this time and saline lock if elevated; acknowledged; new orders written. Barbaraann Faster, RN 4:08 AM 03/16/2018

## 2018-03-16 NOTE — Progress Notes (Signed)
Patient OOB with physical therapy. Patient seated in chair. C/o feeling lightheaded. VSS. No signs of distress. ELQ

## 2018-03-16 NOTE — Evaluation (Signed)
Physical Therapy Evaluation Patient Details [name] MRN: [id number] DOB: 07-05-1929 Today's Date: 03/16/2018   History of Present Illness  Pt is an88 y.o.femalewho presented with alteration in mental status. Patient's family states that she had an episode where she was less responsive and seemed confused. ED work-up showed nitrite positive UA indicative of UTI. Patient's other work-up was largely stable or within normal limits. Hospitalist called for admission.  Assessment includes: Altered mental status/metabolic encephalopathy secondary to UTI, UTI, essential HTN, h/o plymyalgia rheumatica, HLD, DM II, and COPD without acute exacerbation.      Clinical Impression  Pt presents with minor deficits in strength, transfers, mobility, gait, and balance, and with significant deficits in activity tolerance.  Pt was able to perform sit to/from stand transfers from a recliner with good stability and control with some cues for proper sequencing required.  Pt was able to amb 1 x 3' and 1 x 6' with good stability with a RW with SpO2 and HR WNL on 2LO2/min.  Overall pt appears to be near baseline functionally but based on pt's extremely limited activity level at home that consists of only getting up for toileting pt is at a very high risk for further functional decline.  Pt would likely benefit from a trial of HHPT to address the above deficits and to assist with establishing an appropriate HEP/routine for decreased caregiver assistance and decreased risk of further decline.      Follow Up Recommendations Home health PT    Equipment Recommendations  None recommended by PT    Recommendations for Other Services       Precautions / Restrictions Precautions Precautions: Fall Restrictions Weight Bearing Restrictions: No      Mobility  Bed Mobility               General bed mobility comments: NT, pt up in recliner  Transfers Overall transfer level: Needs assistance Equipment  used: Rolling walker (2 wheeled) Transfers: Sit to/from Stand Sit to Stand: Min guard         General transfer comment: Good concentric and eccentric control with transfers with mod verbal cues for hand placement  Ambulation/Gait Ambulation/Gait assistance: Min guard Ambulation Distance (Feet): 6 Feet Assistive device: Rolling walker (2 wheeled) Gait Pattern/deviations: Step-through pattern;Decreased step length - right;Decreased step length - left;Trunk flexed Gait velocity: Reduced   General Gait Details: Pt steady with amb with a RW with SpO2 and HR WNL on 2LO2/min  Stairs            Wheelchair Mobility    Modified Rankin (Stroke Patients Only)       Balance Overall balance assessment: Needs assistance Sitting-balance support: Feet supported;No upper extremity supported Sitting balance-Leahy Scale: Good     Standing balance support: Bilateral upper extremity supported Standing balance-Leahy Scale: Good                               Pertinent Vitals/Pain Pain Assessment: No/denies pain    Home Living Family/patient expects to be discharged to:: Private residence Living Arrangements: Children(Daughter) Available Help at Discharge: Family;Available 24 hours/day Type of Home: House Home Access: Stairs to enter Entrance Stairs-Rails: Left Entrance Stairs-Number of Steps: 2(And then 1 small step/threshold without rails) Home Layout: One level Home Equipment: [place] - 2 wheels;Wheelchair - [employer];Tub bench      Prior Function Level of Independence: Needs assistance   Gait / Transfers Assistance Needed: Pt Mod  Ind with amb in home only and only for short funtional distances like toileting, sits most of the day; uses a w/c in the community  ADL's / Homemaking Assistance Needed: Daughter assists with ADLs  Comments: Pt on chronic 2LO2/min 24/7     Hand Dominance        Extremity/Trunk Assessment   Upper Extremity  Assessment Upper Extremity Assessment: Overall WFL for tasks assessed    Lower Extremity Assessment Lower Extremity Assessment: Generalized weakness       Communication   Communication: No difficulties  Cognition Arousal/Alertness: Awake/alert Behavior During Therapy: WFL for tasks assessed/performed Overall Cognitive Status: Within Functional Limits for tasks assessed                                        General Comments      Exercises Total Joint Exercises Ankle Circles/Pumps: AROM;Both;10 reps Quad Sets: Strengthening;Both;10 reps Gluteal Sets: Strengthening;Both;10 reps Long Arc Quad: AROM;Both;10 reps Knee Flexion: AROM;Both;10 reps Marching in Standing: AROM;Both;5 reps   Assessment/Plan    PT Assessment Patient needs continued PT services  PT Problem List Decreased strength;Decreased activity tolerance       PT Treatment Interventions      PT Goals (Current goals can be found in the Care Plan section)  Acute Rehab PT Goals Patient Stated Goal: To walk better PT Goal Formulation: With patient Time For Goal Achievement: 03/29/18 Potential to Achieve Goals: Good    Frequency Min 2X/week   Barriers to discharge        Co-evaluation               AM-PAC PT "6 Clicks" Daily Activity  Outcome Measure Difficulty turning over in bed (including adjusting bedclothes, sheets and blankets)?: A Little Difficulty moving from lying on back to sitting on the side of the bed? : A Little Difficulty sitting down on and standing up from a chair with arms (e.g., wheelchair, bedside commode, etc,.)?: Unable Help needed moving to and from a bed to chair (including a wheelchair)?: None Help needed walking in hospital room?: A Little Help needed climbing 3-5 steps with a railing? : A Little 6 Click Score: 17    End of Session Equipment Utilized During Treatment: Gait belt;Oxygen Activity Tolerance: Patient tolerated treatment well Patient left:  in chair;with family/visitor present;with call bell/phone within reach;with chair alarm set Nurse Communication: Mobility status PT Visit Diagnosis: Muscle weakness (generalized) (M62.81);Difficulty in walking, not elsewhere classified (R26.2)    Time: 1610-9604 PT Time Calculation (min) (ACUTE ONLY): 31 min   Charges:   PT Evaluation $PT Eval Low Complexity: 1 Low PT Treatments $Therapeutic Exercise: 8-22 mins   PT G Codes:        DRoyetta Asal PT, DPT 03/16/18, 3:33 PM

## 2018-03-16 NOTE — Progress Notes (Signed)
[facility] [place] [facility] [name]    MR#:  [id number]  DATE OF BIRTH:  Jul 25, 1929  SUBJECTIVE:   Patient here due to altered mental status and noted to have a urinary tract infection.  Mental status improved since yesterday.  Still feels somewhat weak.   REVIEW OF SYSTEMS:    Review of Systems  Constitutional: Negative for chills and fever.  HENT: Negative for congestion and tinnitus.   Eyes: Negative for blurred vision and double vision.  Respiratory: Negative for cough, shortness of breath and wheezing.   Cardiovascular: Negative for chest pain, orthopnea and PND.  Gastrointestinal: Negative for abdominal pain, diarrhea, nausea and vomiting.  Genitourinary: Negative for dysuria and hematuria.  Neurological: Positive for weakness (generalized. ). Negative for dizziness, sensory change and focal weakness.  All other systems reviewed and are negative.   Nutrition: Heart healthy/Carb control Tolerating Diet: Yes Tolerating PT: Await Eval.   DRUG ALLERGIES:   Allergies  Allergen Reactions  . Fosamax [Alendronate Sodium] Other (See Comments)    Reaction:  Made pt "very sick"  . Wellbutrin [Bupropion] Other (See Comments)    Reaction:  Unknown    VITALS:  Blood pressure 137/71, pulse 73, temperature 98.4 F (36.9 C), temperature source Oral, resp. rate 18, height 5' (1.524 m), weight 90.7 kg (200 lb), SpO2 97 %.  PHYSICAL EXAMINATION:   Physical Exam  GENERAL:  84 y.o.-year-old obese patient lying in bed in no acute distress.  EYES: Pupils equal, round, reactive to light and accommodation. No scleral icterus. Extraocular muscles intact.  HEENT: Head atraumatic, normocephalic. Oropharynx and nasopharynx clear.  NECK:  Supple, no jugular venous distention. No thyroid enlargement, no tenderness.  LUNGS: Normal breath sounds bilaterally, no wheezing, rales, rhonchi. No use of accessory muscles of respiration.  CARDIOVASCULAR:  S1, S2 normal. No murmurs, rubs, or gallops.  ABDOMEN: Soft, nontender, nondistended. Bowel sounds present. No organomegaly or mass.  EXTREMITIES: No cyanosis, clubbing or edema b/l.    NEUROLOGIC: Cranial nerves II through XII are intact. No focal Motor or sensory deficits b/l. Globally weak.   PSYCHIATRIC: The patient is alert and oriented x 2. SKIN: No obvious rash, lesion, or ulcer.    LABORATORY PANEL:   CBC Recent Labs  Lab 03/16/18 0417  WBC 12.8*  HGB 10.4*  HCT 32.0*  PLT 349   ------------------------------------------------------------------------------------------------------------------  Chemistries  Recent Labs  Lab 03/16/18 0417  NA 134*  K 3.4*  CL 91*  CO2 34*  GLUCOSE 251*  BUN 21*  CREATININE 1.21*  CALCIUM 8.6*   ------------------------------------------------------------------------------------------------------------------  Cardiac Enzymes No results for input(s): TROPONINI in the last 168 hours. ------------------------------------------------------------------------------------------------------------------  RADIOLOGY:  No results found.   ASSESSMENT AND PLAN:   84 year old female with past medical history of diabetes, COPD, previous history of DVT, polymyalgia rheumatica, restless leg syndrome, chronic kidney disease stage III, essential hypertension, hyperlipidemia who presented to the hospital due to lethargy, altered mental status and noted to have urinary tract infection.  1.  Altered mental status-this is metabolic encephalopathy secondary to UTI. -Mental status much improved with IV fluids and IV ceftriaxone for the UTI.  We will continue to follow mental status.  2.  Urinary tract infection-this is based off a urinalysis on admission.  Continue IV ceftriaxone, follow urine cultures.  Mental status improved.  3.  Essential hypertension-continue atenolol  4.  History of previous DVT-continue Eliquis.    5.  History of polymyalgia  rheumatica-continue maintenance  prednisone.  6.  Hyperlipidemia-continue Pravachol.    7.  Diabetes type 2 without complication-continue sliding scale insulin.  Follow blood sugars.  8. Depression - cont. Prozac.   9. COPD - no acute exacerbation.  - cont. Pulmicort nebs, Spiriva.   Await Pt eval.      All the records are reviewed and case discussed with Care Management/Social Worker. Management plans discussed with the patient, family and they are in agreement.  CODE STATUS: full code  DVT Prophylaxis: Eliquis  TOTAL TIME TAKING CARE OF THIS PATIENT: 30 minutes.   POSSIBLE D/C IN 1-2 DAYS, DEPENDING ON CLINICAL CONDITION.   Henreitta Leber M.D on 03/16/2018 at 12:21 PM  Between 7am to 6pm - Pager - [phone]  After 6pm go to [email or web address] - [employer] [facility]  Office  [phone]  CC: Primary care physician; [name], MD

## 2018-03-17 DIAGNOSIS — N183 Chronic kidney disease, stage 3 (moderate): Secondary | ICD-10-CM | POA: Diagnosis not present

## 2018-03-17 DIAGNOSIS — G9340 Encephalopathy, unspecified: Secondary | ICD-10-CM | POA: Diagnosis not present

## 2018-03-17 DIAGNOSIS — N390 Urinary tract infection, site not specified: Secondary | ICD-10-CM | POA: Diagnosis not present

## 2018-03-17 DIAGNOSIS — J449 Chronic obstructive pulmonary disease, unspecified: Secondary | ICD-10-CM | POA: Diagnosis not present

## 2018-03-17 LAB — CBC
HCT: 31.4 % — ABNORMAL LOW (ref 35.0–47.0)
Hemoglobin: 9.9 g/dL — ABNORMAL LOW (ref 12.0–16.0)
MCH: 27.9 pg (ref 26.0–34.0)
MCHC: 31.6 g/dL — ABNORMAL LOW (ref 32.0–36.0)
MCV: 88.3 fL (ref 80.0–100.0)
PLATELETS: 340 10*3/uL (ref 150–440)
RBC: 3.55 MIL/uL — ABNORMAL LOW (ref 3.80–5.20)
RDW: 16.7 % — AB (ref 11.5–14.5)
WBC: 10 10*3/uL (ref 3.6–11.0)

## 2018-03-17 LAB — BASIC METABOLIC PANEL
Anion gap: 6 (ref 5–15)
BUN: 21 mg/dL — AB (ref 6–20)
CHLORIDE: 102 mmol/L (ref 101–111)
CO2: 34 mmol/L — AB (ref 22–32)
CREATININE: 1.25 mg/dL — AB (ref 0.44–1.00)
Calcium: 8.2 mg/dL — ABNORMAL LOW (ref 8.9–10.3)
GFR calc Af Amer: 44 mL/min — ABNORMAL LOW (ref >60)
GFR calc non Af Amer: 38 mL/min — ABNORMAL LOW (ref >60)
Glucose, Bld: 110 mg/dL — ABNORMAL HIGH (ref 65–99)
Potassium: 3.4 mmol/L — ABNORMAL LOW (ref 3.5–5.1)
SODIUM: 142 mmol/L (ref 135–145)

## 2018-03-17 LAB — URINE CULTURE

## 2018-03-17 LAB — GLUCOSE, CAPILLARY
GLUCOSE-CAPILLARY: 119 mg/dL — AB (ref 65–99)
GLUCOSE-CAPILLARY: 189 mg/dL — AB (ref 65–99)

## 2018-03-17 MED ORDER — POLYETHYLENE GLYCOL 3350 17 G PO PACK: 17.0000 g | PACK | Freq: Every day | ORAL | Status: DC | PRN

## 2018-03-17 MED ORDER — CEFUROXIME AXETIL 250 MG PO TABS: 250.0000 mg | ORAL_TABLET | Freq: Two times a day (BID) | ORAL | 0 refills | Status: AC

## 2018-03-17 MED ORDER — DOCUSATE SODIUM 100 MG PO CAPS
100.0000 mg | ORAL_CAPSULE | Freq: Two times a day (BID) | ORAL | Status: DC | PRN
  Administered 2018-03-17: 100 mg via ORAL
  Filled 2018-03-17 (×2): qty 1

## 2018-03-17 NOTE — Care Management (Signed)
Patient admitted with AMS.  Patient lives at home with daughter and son in [place].  PCP Theis.  PT has assessed patient and recommends home health PT.  Patient initially agreeable to home health services, and has used [facility] in the past.  Once daughters were at bedside patient decline home health services.  Daughters state that she does not participate with PT at home, and there is no use "of wasting their time".  Corene Cornea with with [facility] notified to cancel referral.  RNCM signing off.

## 2018-03-17 NOTE — Discharge Summary (Signed)
[facility] [name]    MR#:  [id number]  DATE OF BIRTH:  05-Apr-1929  DATE OF ADMISSION:  03/15/2018 ADMITTING PHYSICIAN: Lance Coon, MD  DATE OF DISCHARGE: 03/17/2018  PRIMARY CARE PHYSICIAN: [name], MD    ADMISSION DIAGNOSIS:  Lower urinary tract infectious disease [N39.0] Hypoglycemia [E16.2]  DISCHARGE DIAGNOSIS:  Principal Problem:   UTI (urinary tract infection) Active Problems:   COPD (chronic obstructive pulmonary disease) (HCC)   Hypertension   Diabetes (HCC)   Hyperlipidemia   Restless leg syndrome   CKD (chronic kidney disease), stage III (HCC)   Acute encephalopathy   SECONDARY DIAGNOSIS:   Past Medical History:  Diagnosis Date  . Cancer ([place])   . Cataract   . Chronic kidney disease (CKD), stage III (moderate) (HCC)   . Chronic urticaria   . COPD (chronic obstructive pulmonary disease) ([place])   . Depression   . Diabetes mellitus without complication ([place])   . DVT (deep venous thrombosis) (HCC)    left leg  . Hyperlipemia   . Hypertension   . Osteoarthritis   . Osteopenia   . Polymyalgia rheumatica ([place])   . Polyneuropathy   . Renal cancer ([place])   . Renal insufficiency 08/10/2016   Chart indicates CKD stage 3  . Restless leg syndrome   . Vitamin D deficiency   . Vulvar intraepithelial neoplasia with lichen sclerosus     HOSPITAL COURSE:   84 year old female with past medical history of diabetes, COPD, previous history of DVT, polymyalgia rheumatica, restless leg syndrome, chronic kidney disease stage III, essential hypertension, hyperlipidemia who presented to the hospital due to lethargy, altered mental status and noted to have urinary tract infection.  1.  Altered mental status-this was metabolic encephalopathy secondary to UTI. -Mental status much improved with IV fluids and IV ceftriaxone for the UTI.  MS back to baseline now.   2.  Urinary tract infection-this is based  off a urinalysis on admission.   - pt. Was treated with IV Ceftriaxone and urine cultures are showing mixed colonies. Will finish treatment with 2 more days of Oral Ceftin.   3.  Essential hypertension- she will continue atenolol  4.  History of previous DVT- she will continue Eliquis.    5.  History of polymyalgia rheumatica- she will continue her maintenance prednisone.  6.  Hyperlipidemia- she will continue Pravachol.    7.  Diabetes type 2 without complication- while in the hospital patient was on sliding scale insulin, she will resume her Lantus, NovoLog with meals upon discharge.  8. Depression - she will cont. Prozac.   9. COPD - no acute exacerbation.  - she will cont. Her Spiriva and O2 supplementation.      DISCHARGE CONDITIONS:   Stable  CONSULTS OBTAINED:    DRUG ALLERGIES:   Allergies  Allergen Reactions  . Fosamax [Alendronate Sodium] Other (See Comments)    Reaction:  Made pt "very sick"  . Wellbutrin [Bupropion] Other (See Comments)    Reaction:  Unknown    DISCHARGE MEDICATIONS:   Allergies as of 03/17/2018      Reactions   Fosamax [alendronate Sodium] Other (See Comments)   Reaction:  Made pt "very sick"   Wellbutrin [bupropion] Other (See Comments)   Reaction:  Unknown      Medication List    STOP taking these medications   budesonide 0.25 MG/2ML nebulizer solution Commonly known as:  PULMICORT   guaiFENesin 600  MG 12 hr tablet Commonly known as:  MUCINEX     TAKE these medications   albuterol (2.5 MG/3ML) 0.083% nebulizer solution Commonly known as:  PROVENTIL Take 3 mLs (2.5 mg total) by nebulization every 4 (four) hours.   alendronate 35 MG tablet Commonly known as:  FOSAMAX Take 35 mg by mouth every 7 (seven) days. Take with a full glass of water on an empty stomach.   apixaban 5 MG Tabs tablet Commonly known as:  ELIQUIS Take 2 tablets (10 mg total) by mouth 2 (two) times daily. 2 tabs bid for 7 days then 1 tab  bid What changed:    how much to take  additional instructions   aspirin EC 81 MG tablet Take 81 mg by mouth daily.   atenolol 25 MG tablet Commonly known as:  TENORMIN Take 25 mg by mouth 2 (two) times daily.   cefUROXime 250 MG tablet Commonly known as:  CEFTIN Take 1 tablet (250 mg total) by mouth 2 (two) times daily with a meal for 3 days.   diphenhydramine-acetaminophen 25-500 MG Tabs tablet Commonly known as:  TYLENOL PM Take 1 tablet by mouth at bedtime as needed (sleep or pain).   FLUoxetine 20 MG capsule Commonly known as:  PROZAC Take 20 mg by mouth daily.   furosemide 20 MG tablet Commonly known as:  LASIX Take 20 mg by mouth daily.   gabapentin 300 MG capsule Commonly known as:  NEURONTIN Take 1 capsule (300MG ) by mouth every morning, 1 capsule (300MG ) every evening and 3 capsules (900MG ) at bedtime   GNP CAL MAG ZINC +D3 PO Take 1 tablet by mouth daily.   insulin aspart 100 UNIT/ML injection Commonly known as:  novoLOG Inject 20u with full meals twice to three times daily as directed   insulin glargine 100 UNIT/ML injection Commonly known as:  LANTUS Inject 50u under the skin in two doses every morning (100u total)   lidocaine 5 % Commonly known as:  LIDODERM Place 1 patch onto the skin daily. Remove & Discard patch within 12 hours or as directed by MD   pravastatin 20 MG tablet Commonly known as:  PRAVACHOL Take 20 mg by mouth every evening.   predniSONE 5 MG tablet Commonly known as:  DELTASONE Take 7.5 mg by mouth daily.   tiotropium 18 MCG inhalation capsule Commonly known as:  SPIRIVA Place 1 capsule (18 mcg total) into inhaler and inhale daily.   vitamin B-12 1000 MCG tablet Commonly known as:  CYANOCOBALAMIN Take 1,000 mcg by mouth daily.   Vitamin D (Ergocalciferol) 50000 units Caps capsule Commonly known as:  DRISDOL Take 50,000 Units by mouth every Monday.         DISCHARGE INSTRUCTIONS:   DIET:  Cardiac diet and  Diabetic diet  DISCHARGE CONDITION:  Stable  ACTIVITY:  Activity as tolerated  OXYGEN:  Home Oxygen: No.   Oxygen Delivery: room air  DISCHARGE LOCATION:  Home with Health PT.     If you experience worsening of your admission symptoms, develop shortness of breath, life threatening emergency, suicidal or homicidal thoughts you must seek medical attention immediately by calling 911 or calling your MD immediately  if symptoms less severe.  You Must read complete instructions/literature along with all the possible adverse reactions/side effects for all the Medicines you take and that have been prescribed to you. Take any new Medicines after you have completely understood and accpet all the possible adverse reactions/side effects.   Please note  You were cared for by a hospitalist during your hospital stay. If you have any questions about your discharge medications or the care you received while you were in the hospital after you are discharged, you can call the unit and asked to speak with the hospitalist on call if the hospitalist that took care of you is not available. Once you are discharged, your primary care physician will handle any further medical issues. Please note that NO REFILLS for any discharge medications will be authorized once you are discharged, as it is imperative that you return to your primary care physician (or establish a relationship with a primary care physician if you do not have one) for your aftercare needs so that they can reassess your need for medications and monitor your lab values.     Today   No acute events overnight. MS back to baseline.  Will d/c home with Home Health today. Daughters in agreement.   VITAL SIGNS:  Blood pressure (!) 193/59, pulse 70, temperature 98.1 F (36.7 C), resp. rate 17, height 5' (1.524 m), weight 90.7 kg (200 lb), SpO2 100 %.  I/O:    Intake/Output Summary (Last 24 hours) at 03/17/2018 1406 Last data filed at 03/17/2018  1020 Gross per 24 hour  Intake 2547 ml  Output 500 ml  Net 2047 ml    PHYSICAL EXAMINATION:  GENERAL:  84 y.o.-year-old patient lying in the bed in no acute distress.  EYES: Pupils equal, round, reactive to light and accommodation. No scleral icterus. Extraocular muscles intact.  HEENT: Head atraumatic, normocephalic. Oropharynx and nasopharynx clear.  NECK:  Supple, no jugular venous distention. No thyroid enlargement, no tenderness.  LUNGS: Normal breath sounds bilaterally, no wheezing, rales,rhonchi. No use of accessory muscles of respiration.  CARDIOVASCULAR: S1, S2 normal. No murmurs, rubs, or gallops.  ABDOMEN: Soft, non-tender, non-distended. Bowel sounds present. No organomegaly or mass.  EXTREMITIES: No pedal edema, cyanosis, or clubbing.  NEUROLOGIC: Cranial nerves II through XII are intact. No focal motor or sensory defecits b/l. Globally weak. PSYCHIATRIC: The patient is alert and oriented x 3.  SKIN: No obvious rash, lesion, or ulcer.   DATA REVIEW:   CBC Recent Labs  Lab 03/17/18 0423  WBC 10.0  HGB 9.9*  HCT 31.4*  PLT 340    Chemistries  Recent Labs  Lab 03/17/18 0423  NA 142  K 3.4*  CL 102  CO2 34*  GLUCOSE 110*  BUN 21*  CREATININE 1.25*  CALCIUM 8.2*    Cardiac Enzymes No results for input(s): TROPONINI in the last 168 hours.  Microbiology Results  Results for orders placed or performed during the hospital encounter of 03/15/18  Urine culture     Status: Abnormal   Collection Time: 03/15/18  4:20 PM  Result Value Ref Range Status   Specimen Description   Final    URINE, RANDOM Performed at [facility], [address] [place], [place] [address]    Special Requests   Final    NONE Performed at [facility], [address] [place], [place] [address]    Culture MULTIPLE ORGANISMS PRESENT, NONE PREDOMINANT (A)  Final   Report Status 03/17/2018 FINAL  Final    RADIOLOGY:  No results found.    Management plans  discussed with the patient, family and they are in agreement.  CODE STATUS:     Code Status Orders  (From admission, onward)        Start     Ordered  03/15/18 2153  Full code  Continuous     03/15/18 2153    TOTAL TIME TAKING CARE OF THIS PATIENT: 40 minutes.    Henreitta Leber M.D on 03/17/2018 at 2:06 PM  Between 7am to 6pm - Pager - [phone]  After 6pm go to [email or web address] - [employer] [facility]  Office  [phone]  CC: Primary care physician; [name], MD

## 2018-03-17 NOTE — Care Management Obs Status (Signed)
[facility] NOTIFICATION   Patient Details  [name] MRN: [id number] Date of Birth: 03-Mar-1929   Medicare Observation Status Notification Given:  Yes    Beverly Sessions, RN 03/17/2018, 12:31 PM

## 2018-03-17 NOTE — Progress Notes (Signed)
03/17/2018   [place] to be D/C'd Home per MD order.  Discussed prescriptions and follow up appointments with the patient. Prescriptions given to patient, medication list explained in detail. Pt verbalized understanding.  Allergies as of 03/17/2018      Reactions   Fosamax [alendronate Sodium] Other (See Comments)   Reaction:  Made pt "very sick"   Wellbutrin [bupropion] Other (See Comments)   Reaction:  Unknown      Medication List    STOP taking these medications   budesonide 0.25 MG/2ML nebulizer solution Commonly known as:  PULMICORT   guaiFENesin 600 MG 12 hr tablet Commonly known as:  MUCINEX     TAKE these medications   albuterol (2.5 MG/3ML) 0.083% nebulizer solution Commonly known as:  PROVENTIL Take 3 mLs (2.5 mg total) by nebulization every 4 (four) hours.   alendronate 35 MG tablet Commonly known as:  FOSAMAX Take 35 mg by mouth every 7 (seven) days. Take with a full glass of water on an empty stomach.   apixaban 5 MG Tabs tablet Commonly known as:  ELIQUIS Take 2 tablets (10 mg total) by mouth 2 (two) times daily. 2 tabs bid for 7 days then 1 tab bid What changed:    how much to take  additional instructions   aspirin EC 81 MG tablet Take 81 mg by mouth daily.   atenolol 25 MG tablet Commonly known as:  TENORMIN Take 25 mg by mouth 2 (two) times daily.   cefUROXime 250 MG tablet Commonly known as:  CEFTIN Take 1 tablet (250 mg total) by mouth 2 (two) times daily with a meal for 3 days.   diphenhydramine-acetaminophen 25-500 MG Tabs tablet Commonly known as:  TYLENOL PM Take 1 tablet by mouth at bedtime as needed (sleep or pain).   FLUoxetine 20 MG capsule Commonly known as:  PROZAC Take 20 mg by mouth daily.   furosemide 20 MG tablet Commonly known as:  LASIX Take 20 mg by mouth daily.   gabapentin 300 MG capsule Commonly known as:  NEURONTIN Take 1 capsule (300MG ) by mouth every morning, 1 capsule (300MG ) every evening and 3  capsules (900MG ) at bedtime   GNP CAL MAG ZINC +D3 PO Take 1 tablet by mouth daily.   insulin aspart 100 UNIT/ML injection Commonly known as:  novoLOG Inject 20u with full meals twice to three times daily as directed   insulin glargine 100 UNIT/ML injection Commonly known as:  LANTUS Inject 50u under the skin in two doses every morning (100u total)   lidocaine 5 % Commonly known as:  LIDODERM Place 1 patch onto the skin daily. Remove & Discard patch within 12 hours or as directed by MD   pravastatin 20 MG tablet Commonly known as:  PRAVACHOL Take 20 mg by mouth every evening.   predniSONE 5 MG tablet Commonly known as:  DELTASONE Take 7.5 mg by mouth daily.   tiotropium 18 MCG inhalation capsule Commonly known as:  SPIRIVA Place 1 capsule (18 mcg total) into inhaler and inhale daily.   vitamin B-12 1000 MCG tablet Commonly known as:  CYANOCOBALAMIN Take 1,000 mcg by mouth daily.   Vitamin D (Ergocalciferol) 50000 units Caps capsule Commonly known as:  DRISDOL Take 50,000 Units by mouth every Monday.       Vitals:   03/17/18 0718 03/17/18 1148  BP:  (!) 193/59  Pulse:  70  Resp:  17  Temp:  98.1 F (36.7 C)  SpO2: 98%  100%    Skin clean, dry and intact without evidence of skin break down, no evidence of skin tears noted. IV catheter discontinued intact. Site without signs and symptoms of complications. Dressing and pressure applied. Pt denies pain at this time. No complaints noted.  An After Visit Summary was printed and given to the patient. Patient escorted via [place], and D/C home via private auto.  Dola Argyle

## 2018-03-17 NOTE — Care Management CC44 (Signed)
Condition Code 44 Documentation Completed  Patient Details  [name] MRN: [id number] Date of Birth: Nov 26, 1928   Condition Code 44 given:  Yes Patient signature on Condition Code 44 notice:  Yes Documentation of 2 MD's agreement:  Yes Code 44 added to claim:  Yes    Beverly Sessions, RN 03/17/2018, 12:31 PM

## 2018-03-17 NOTE — Progress Notes (Signed)
Inpatient Diabetes Program Recommendations  AACE/ADA: New Consensus Statement on Inpatient Glycemic Control (2015)  Target Ranges:  Prepandial:   less than 140 mg/dL      Peak postprandial:   less than 180 mg/dL (1-2 hours)      Critically ill patients:  140 - 180 mg/dL  Results for Tracy Mcmahon, Tracy Mcmahon (MRN [id number]) as of 03/17/2018 08:55  Ref. Range 03/17/2018 04:23  Glucose Latest Ref Range: 65 - 99 mg/dL 110 (H)   Results for Tracy Mcmahon, Tracy Mcmahon (MRN [id number]) as of 03/17/2018 08:55  Ref. Range 03/16/2018 04:03 03/16/2018 08:01 03/16/2018 12:11 03/16/2018 16:41 03/16/2018 21:24  Glucose-Capillary Latest Ref Range: 65 - 99 mg/dL 266 (H) 160 (H) 256 (H) 193 (H) 233 (H)   Review of Glycemic Control  Diabetes history: DM2 Outpatient Diabetes medications: Lantus 80 units daily at 1pm, Novolog 0-20 units BID (correction scale) Current orders for Inpatient glycemic control: Lantus 40 units QHS, Novolog 0-9 units TID with meals, Novolog 0-5 units QHS; Prednisone 7.5 mg daily  Inpatient Diabetes Program Recommendations: Insulin - Meal Coverage: Please consider ordering Novolog 3 units TID with meals for meal coverage if patient eats at least 50% of meals.  Thanks, Barnie Alderman, RN, MSN, CDE Diabetes Coordinator Inpatient Diabetes Program [phone] (Team Pager from 8am to 5pm)

## 2018-03-20 ENCOUNTER — Telehealth: Payer: Self-pay

## 2018-03-20 NOTE — Telephone Encounter (Signed)
Flagged on EMMI report for not having a follow up scheduled and being unsure if any new prescriptions.   Called and spoke with [name], patient's daughter.  Per daughter, patient discharged on an antibiotic which she reports taking.  She also mentioned a follow up scheduled for 03/25/18 with Dr. Raechel Ache.  I thanked her for her time and informed her that they would receive one more automated call in the next few days checking on the patient a final time.

## 2018-03-25 DIAGNOSIS — N183 Chronic kidney disease, stage 3 (moderate): Secondary | ICD-10-CM | POA: Diagnosis not present

## 2018-03-25 DIAGNOSIS — E1122 Type 2 diabetes mellitus with diabetic chronic kidney disease: Secondary | ICD-10-CM | POA: Diagnosis not present

## 2018-03-25 DIAGNOSIS — A499 Bacterial infection, unspecified: Secondary | ICD-10-CM | POA: Diagnosis not present

## 2018-03-25 DIAGNOSIS — I10 Essential (primary) hypertension: Secondary | ICD-10-CM | POA: Diagnosis not present

## 2018-03-25 DIAGNOSIS — J439 Emphysema, unspecified: Secondary | ICD-10-CM | POA: Diagnosis not present

## 2018-03-25 DIAGNOSIS — E11649 Type 2 diabetes mellitus with hypoglycemia without coma: Secondary | ICD-10-CM | POA: Diagnosis not present

## 2018-03-25 DIAGNOSIS — Z794 Long term (current) use of insulin: Secondary | ICD-10-CM | POA: Diagnosis not present

## 2018-03-25 DIAGNOSIS — N390 Urinary tract infection, site not specified: Secondary | ICD-10-CM | POA: Diagnosis not present

## 2018-03-25 DIAGNOSIS — D631 Anemia in chronic kidney disease: Secondary | ICD-10-CM | POA: Diagnosis not present

## 2018-04-01 DIAGNOSIS — J439 Emphysema, unspecified: Secondary | ICD-10-CM | POA: Diagnosis not present

## 2018-04-01 DIAGNOSIS — Z8679 Personal history of other diseases of the circulatory system: Secondary | ICD-10-CM | POA: Diagnosis not present

## 2018-04-01 DIAGNOSIS — R0609 Other forms of dyspnea: Secondary | ICD-10-CM | POA: Diagnosis not present

## 2018-04-09 DIAGNOSIS — H0014 Chalazion left upper eyelid: Secondary | ICD-10-CM | POA: Diagnosis not present

## 2018-04-09 DIAGNOSIS — H00014 Hordeolum externum left upper eyelid: Secondary | ICD-10-CM | POA: Diagnosis not present

## 2018-04-09 DIAGNOSIS — H353114 Nonexudative age-related macular degeneration, right eye, advanced atrophic with subfoveal involvement: Secondary | ICD-10-CM | POA: Diagnosis not present

## 2018-04-09 DIAGNOSIS — H353212 Exudative age-related macular degeneration, right eye, with inactive choroidal neovascularization: Secondary | ICD-10-CM | POA: Diagnosis not present

## 2018-04-10 DIAGNOSIS — H0014 Chalazion left upper eyelid: Secondary | ICD-10-CM | POA: Diagnosis not present

## 2018-04-30 ENCOUNTER — Other Ambulatory Visit: Payer: Self-pay

## 2018-04-30 ENCOUNTER — Emergency Department: Payer: Medicare HMO

## 2018-04-30 ENCOUNTER — Observation Stay
Admission: EM | Admit: 2018-04-30 | Discharge: 2018-05-03 | Disposition: A | Discharge status: Home Health | Payer: Medicare HMO | Attending: Internal Medicine | Admitting: Internal Medicine

## 2018-04-30 DIAGNOSIS — B9620 Unspecified Escherichia coli [E. coli] as the cause of diseases classified elsewhere: Secondary | ICD-10-CM | POA: Diagnosis not present

## 2018-04-30 DIAGNOSIS — E785 Hyperlipidemia, unspecified: Secondary | ICD-10-CM | POA: Diagnosis not present

## 2018-04-30 DIAGNOSIS — Z90710 Acquired absence of both cervix and uterus: Secondary | ICD-10-CM | POA: Insufficient documentation

## 2018-04-30 DIAGNOSIS — Z9849 Cataract extraction status, unspecified eye: Secondary | ICD-10-CM | POA: Insufficient documentation

## 2018-04-30 DIAGNOSIS — N183 Chronic kidney disease, stage 3 (moderate): Secondary | ICD-10-CM | POA: Diagnosis not present

## 2018-04-30 DIAGNOSIS — G9341 Metabolic encephalopathy: Secondary | ICD-10-CM | POA: Diagnosis not present

## 2018-04-30 DIAGNOSIS — Z79899 Other long term (current) drug therapy: Secondary | ICD-10-CM | POA: Insufficient documentation

## 2018-04-30 DIAGNOSIS — Z96643 Presence of artificial hip joint, bilateral: Secondary | ICD-10-CM | POA: Insufficient documentation

## 2018-04-30 DIAGNOSIS — R4182 Altered mental status, unspecified: Secondary | ICD-10-CM | POA: Diagnosis not present

## 2018-04-30 DIAGNOSIS — M353 Polymyalgia rheumatica: Secondary | ICD-10-CM | POA: Insufficient documentation

## 2018-04-30 DIAGNOSIS — G2581 Restless legs syndrome: Secondary | ICD-10-CM | POA: Insufficient documentation

## 2018-04-30 DIAGNOSIS — Z888 Allergy status to other drugs, medicaments and biological substances status: Secondary | ICD-10-CM | POA: Diagnosis not present

## 2018-04-30 DIAGNOSIS — Z85528 Personal history of other malignant neoplasm of kidney: Secondary | ICD-10-CM | POA: Insufficient documentation

## 2018-04-30 DIAGNOSIS — Z7901 Long term (current) use of anticoagulants: Secondary | ICD-10-CM | POA: Insufficient documentation

## 2018-04-30 DIAGNOSIS — J984 Other disorders of lung: Secondary | ICD-10-CM | POA: Diagnosis not present

## 2018-04-30 DIAGNOSIS — F329 Major depressive disorder, single episode, unspecified: Secondary | ICD-10-CM | POA: Diagnosis not present

## 2018-04-30 DIAGNOSIS — E11649 Type 2 diabetes mellitus with hypoglycemia without coma: Secondary | ICD-10-CM | POA: Diagnosis not present

## 2018-04-30 DIAGNOSIS — Z86711 Personal history of pulmonary embolism: Secondary | ICD-10-CM | POA: Diagnosis not present

## 2018-04-30 DIAGNOSIS — N3090 Cystitis, unspecified without hematuria: Secondary | ICD-10-CM

## 2018-04-30 DIAGNOSIS — B961 Klebsiella pneumoniae [K. pneumoniae] as the cause of diseases classified elsewhere: Secondary | ICD-10-CM | POA: Diagnosis not present

## 2018-04-30 DIAGNOSIS — N390 Urinary tract infection, site not specified: Secondary | ICD-10-CM | POA: Diagnosis not present

## 2018-04-30 DIAGNOSIS — E559 Vitamin D deficiency, unspecified: Secondary | ICD-10-CM | POA: Diagnosis not present

## 2018-04-30 DIAGNOSIS — I10 Essential (primary) hypertension: Secondary | ICD-10-CM | POA: Diagnosis not present

## 2018-04-30 DIAGNOSIS — E162 Hypoglycemia, unspecified: Secondary | ICD-10-CM | POA: Diagnosis present

## 2018-04-30 DIAGNOSIS — N3000 Acute cystitis without hematuria: Secondary | ICD-10-CM | POA: Diagnosis not present

## 2018-04-30 DIAGNOSIS — Z9981 Dependence on supplemental oxygen: Secondary | ICD-10-CM | POA: Insufficient documentation

## 2018-04-30 DIAGNOSIS — Z86718 Personal history of other venous thrombosis and embolism: Secondary | ICD-10-CM | POA: Insufficient documentation

## 2018-04-30 DIAGNOSIS — Z8249 Family history of ischemic heart disease and other diseases of the circulatory system: Secondary | ICD-10-CM | POA: Insufficient documentation

## 2018-04-30 DIAGNOSIS — Z905 Acquired absence of kidney: Secondary | ICD-10-CM | POA: Insufficient documentation

## 2018-04-30 DIAGNOSIS — I129 Hypertensive chronic kidney disease with stage 1 through stage 4 chronic kidney disease, or unspecified chronic kidney disease: Secondary | ICD-10-CM | POA: Insufficient documentation

## 2018-04-30 DIAGNOSIS — Z794 Long term (current) use of insulin: Secondary | ICD-10-CM | POA: Insufficient documentation

## 2018-04-30 DIAGNOSIS — J449 Chronic obstructive pulmonary disease, unspecified: Secondary | ICD-10-CM | POA: Diagnosis not present

## 2018-04-30 DIAGNOSIS — R531 Weakness: Secondary | ICD-10-CM | POA: Diagnosis not present

## 2018-04-30 DIAGNOSIS — Z7982 Long term (current) use of aspirin: Secondary | ICD-10-CM | POA: Insufficient documentation

## 2018-04-30 DIAGNOSIS — E1142 Type 2 diabetes mellitus with diabetic polyneuropathy: Secondary | ICD-10-CM | POA: Insufficient documentation

## 2018-04-30 LAB — COMPREHENSIVE METABOLIC PANEL
ALBUMIN: 3.4 g/dL — AB (ref 3.5–5.0)
ALK PHOS: 58 U/L (ref 38–126)
ALT: 14 U/L (ref 14–54)
ANION GAP: 10 (ref 5–15)
AST: 26 U/L (ref 15–41)
BUN: 22 mg/dL — ABNORMAL HIGH (ref 6–20)
CALCIUM: 8.8 mg/dL — AB (ref 8.9–10.3)
CO2: 32 mmol/L (ref 22–32)
Chloride: 95 mmol/L — ABNORMAL LOW (ref 101–111)
Creatinine, Ser: 1.17 mg/dL — ABNORMAL HIGH (ref 0.44–1.00)
GFR calc Af Amer: 47 mL/min — ABNORMAL LOW (ref >60)
GFR calc non Af Amer: 41 mL/min — ABNORMAL LOW (ref >60)
GLUCOSE: 176 mg/dL — AB (ref 65–99)
Potassium: 3.7 mmol/L (ref 3.5–5.1)
SODIUM: 137 mmol/L (ref 135–145)
Total Bilirubin: 0.5 mg/dL (ref 0.3–1.2)
Total Protein: 7.2 g/dL (ref 6.5–8.1)

## 2018-04-30 LAB — CBC WITH DIFFERENTIAL/PLATELET
BASOS ABS: 0.1 10*3/uL (ref 0–0.1)
BASOS PCT: 1 %
EOS ABS: 0.1 10*3/uL (ref 0–0.7)
Eosinophils Relative: 1 %
HCT: 33.9 % — ABNORMAL LOW (ref 35.0–47.0)
HEMOGLOBIN: 10.6 g/dL — AB (ref 12.0–16.0)
Lymphocytes Relative: 8 %
Lymphs Abs: 0.9 10*3/uL — ABNORMAL LOW (ref 1.0–3.6)
MCH: 27.3 pg (ref 26.0–34.0)
MCHC: 31.4 g/dL — AB (ref 32.0–36.0)
MCV: 87.1 fL (ref 80.0–100.0)
Monocytes Absolute: 0.7 10*3/uL (ref 0.2–0.9)
Monocytes Relative: 6 %
NEUTROS PCT: 86 %
Neutro Abs: 10 10*3/uL — ABNORMAL HIGH (ref 1.4–6.5)
Platelets: 332 10*3/uL (ref 150–440)
RBC: 3.9 MIL/uL (ref 3.80–5.20)
RDW: 17 % — ABNORMAL HIGH (ref 11.5–14.5)
WBC: 11.7 10*3/uL — AB (ref 3.6–11.0)

## 2018-04-30 LAB — URINALYSIS, COMPLETE (UACMP) WITH MICROSCOPIC
Bilirubin Urine: NEGATIVE
Glucose, UA: NEGATIVE mg/dL
KETONES UR: NEGATIVE mg/dL
LEUKOCYTES UA: NEGATIVE
NITRITE: POSITIVE — AB
PROTEIN: NEGATIVE mg/dL
Specific Gravity, Urine: 1.019 (ref 1.005–1.030)
pH: 5 (ref 5.0–8.0)

## 2018-04-30 LAB — GLUCOSE, CAPILLARY
GLUCOSE-CAPILLARY: 40 mg/dL — AB (ref 65–99)
Glucose-Capillary: 101 mg/dL — ABNORMAL HIGH (ref 65–99)
Glucose-Capillary: 141 mg/dL — ABNORMAL HIGH (ref 65–99)
Glucose-Capillary: 153 mg/dL — ABNORMAL HIGH (ref 65–99)
Glucose-Capillary: 157 mg/dL — ABNORMAL HIGH (ref 65–99)
Glucose-Capillary: 46 mg/dL — ABNORMAL LOW (ref 65–99)
Glucose-Capillary: 89 mg/dL (ref 65–99)

## 2018-04-30 LAB — TROPONIN I: Troponin I: <0.03 ng/mL (ref <0.03)

## 2018-04-30 LAB — CK: CK TOTAL: 59 U/L (ref 38–234)

## 2018-04-30 MED ORDER — ASPIRIN EC 81 MG PO TBEC
81.0000 mg | DELAYED_RELEASE_TABLET | Freq: Every day | ORAL | Status: DC
  Administered 2018-04-30 – 2018-05-03 (×4): 81 mg via ORAL
  Filled 2018-04-30 (×4): qty 1

## 2018-04-30 MED ORDER — SODIUM CHLORIDE 0.9 % IV SOLN
1000.0000 mL | Freq: Once | INTRAVENOUS | Status: AC
Start: 2018-04-30 — End: 2018-04-30
  Administered 2018-04-30: 1000 mL via INTRAVENOUS

## 2018-04-30 MED ORDER — SENNOSIDES-DOCUSATE SODIUM 8.6-50 MG PO TABS: 1.0000 | ORAL_TABLET | Freq: Every evening | ORAL | Status: DC | PRN

## 2018-04-30 MED ORDER — PREDNISONE 5 MG PO TABS
7.5000 mg | ORAL_TABLET | Freq: Every day | ORAL | Status: DC
  Administered 2018-04-30 – 2018-05-02 (×3): 7.5 mg via ORAL
  Filled 2018-04-30 (×4): qty 1.5

## 2018-04-30 MED ORDER — ONDANSETRON HCL 4 MG/2ML IJ SOLN: 4.0000 mg | Freq: Four times a day (QID) | INTRAMUSCULAR | Status: DC | PRN

## 2018-04-30 MED ORDER — FLUOXETINE HCL 20 MG PO CAPS
20.0000 mg | ORAL_CAPSULE | Freq: Every day | ORAL | Status: DC
  Administered 2018-04-30 – 2018-05-03 (×4): 20 mg via ORAL
  Filled 2018-04-30 (×4): qty 1

## 2018-04-30 MED ORDER — VITAMIN D (ERGOCALCIFEROL) 1.25 MG (50000 UNIT) PO CAPS: 50000.0000 [IU] | ORAL_CAPSULE | ORAL | Status: DC

## 2018-04-30 MED ORDER — ALBUTEROL SULFATE (2.5 MG/3ML) 0.083% IN NEBU: 2.5000 mg | INHALATION_SOLUTION | RESPIRATORY_TRACT | Status: DC | PRN

## 2018-04-30 MED ORDER — VITAMIN B-12 1000 MCG PO TABS
1000.0000 ug | ORAL_TABLET | Freq: Every day | ORAL | Status: DC
  Administered 2018-04-30 – 2018-05-03 (×4): 1000 ug via ORAL
  Filled 2018-04-30 (×4): qty 1

## 2018-04-30 MED ORDER — ALBUTEROL SULFATE (2.5 MG/3ML) 0.083% IN NEBU
2.5000 mg | INHALATION_SOLUTION | RESPIRATORY_TRACT | Status: DC
  Administered 2018-04-30 (×2): 2.5 mg via RESPIRATORY_TRACT
  Filled 2018-04-30 (×2): qty 3

## 2018-04-30 MED ORDER — ALENDRONATE SODIUM 35 MG PO TABS: 35.0000 mg | ORAL_TABLET | ORAL | Status: DC

## 2018-04-30 MED ORDER — SODIUM CHLORIDE 0.9 % IV SOLN
1.0000 g | INTRAVENOUS | Status: DC
  Administered 2018-05-01 – 2018-05-02 (×2): 1 g via INTRAVENOUS
  Filled 2018-04-30 (×2): qty 1
  Filled 2018-04-30: qty 10

## 2018-04-30 MED ORDER — GABAPENTIN 300 MG PO CAPS
300.0000 mg | ORAL_CAPSULE | Freq: Two times a day (BID) | ORAL | Status: DC
  Administered 2018-04-30 – 2018-05-03 (×6): 300 mg via ORAL
  Filled 2018-04-30 (×6): qty 1

## 2018-04-30 MED ORDER — APIXABAN 5 MG PO TABS
5.0000 mg | ORAL_TABLET | Freq: Two times a day (BID) | ORAL | Status: DC
  Administered 2018-04-30 – 2018-05-03 (×6): 5 mg via ORAL
  Filled 2018-04-30 (×6): qty 1

## 2018-04-30 MED ORDER — DEXTROSE-NACL 5-0.9 % IV SOLN
INTRAVENOUS | Status: DC
  Administered 2018-04-30 – 2018-05-01 (×3): via INTRAVENOUS

## 2018-04-30 MED ORDER — ONDANSETRON HCL 4 MG PO TABS: 4.0000 mg | ORAL_TABLET | Freq: Four times a day (QID) | ORAL | Status: DC | PRN

## 2018-04-30 MED ORDER — ATENOLOL 25 MG PO TABS
25.0000 mg | ORAL_TABLET | Freq: Two times a day (BID) | ORAL | Status: DC
  Administered 2018-04-30 – 2018-05-03 (×7): 25 mg via ORAL
  Filled 2018-04-30 (×8): qty 1

## 2018-04-30 MED ORDER — ACETAMINOPHEN 325 MG PO TABS: 650.0000 mg | ORAL_TABLET | Freq: Four times a day (QID) | ORAL | Status: DC | PRN

## 2018-04-30 MED ORDER — ACETAMINOPHEN 650 MG RE SUPP: 650.0000 mg | Freq: Four times a day (QID) | RECTAL | Status: DC | PRN

## 2018-04-30 MED ORDER — GABAPENTIN 300 MG PO CAPS
900.0000 mg | ORAL_CAPSULE | Freq: Every day | ORAL | Status: DC
  Administered 2018-04-30 – 2018-05-02 (×3): 900 mg via ORAL
  Filled 2018-04-30 (×3): qty 3

## 2018-04-30 MED ORDER — SODIUM CHLORIDE 0.9 % IV SOLN
1.0000 g | Freq: Once | INTRAVENOUS | Status: AC
  Administered 2018-04-30: 1 g via INTRAVENOUS
  Filled 2018-04-30: qty 10

## 2018-04-30 MED ORDER — TIOTROPIUM BROMIDE MONOHYDRATE 18 MCG IN CAPS
18.0000 ug | ORAL_CAPSULE | Freq: Every day | RESPIRATORY_TRACT | Status: DC
  Administered 2018-05-01 – 2018-05-03 (×3): 18 ug via RESPIRATORY_TRACT
  Filled 2018-04-30: qty 5

## 2018-04-30 MED ORDER — PRAVASTATIN SODIUM 20 MG PO TABS
20.0000 mg | ORAL_TABLET | Freq: Every evening | ORAL | Status: DC
  Administered 2018-04-30 – 2018-05-02 (×3): 20 mg via ORAL
  Filled 2018-04-30 (×3): qty 1

## 2018-04-30 NOTE — ED Triage Notes (Signed)
Pt comes [facility] from home with c/o AMS per son-in-law. Son-in-law reports patient has been in recliner for 3 days and not responding to family. EMS states when they arrived pt's initial BS-37. Pt given 150cc of D10. BS rechecked and at 108. VSS and EKG unremarkable. Pt is alert and answering questions at this time. Pt unaware if she is diabetic and not sure of names of medications she takes.

## 2018-04-30 NOTE — Progress Notes (Signed)
Advanced care plan. Purpose of the Encounter: CODE STATUS Parties in Attendance: Patient Patient's Decision Capacity: Good Subjective/Patient's story: Presented to the emergency room for generalized weakness Objective/Medical story Has urinary tract infection and low blood sugar Goals of care determination:  Advance care directives and goals of care discussed For now patient wants cardiac resuscitation, intubation and ventilator if the need arises CODE STATUS: Full code Time spent discussing advanced care planning: 16 minutes

## 2018-04-30 NOTE — ED Notes (Signed)
Pt desating in 88-89% on room air. Pt states she sometimes wears O2 at home. O2 at 2L applied nasal cannula to patient at this time. Pt O2 currently at 97%.

## 2018-04-30 NOTE — ED Notes (Signed)
Pt cleaned up at this time. Clean brief applied and pt repositioned in bed.

## 2018-04-30 NOTE — ED Provider Notes (Signed)
[facility] Emergency Department Provider Note       Time seen: ----------------------------------------- 12:10 PM on 04/30/2018 -----------------------------------------   I have reviewed the triage vital signs and the nursing notes.  HISTORY   Chief Complaint Altered Mental Status    HPI Tracy Mcmahon is a 84 y.o. female with a history of CKD, COPD, diabetes, DVT, hypertension, hyperlipidemia who presents to the ED for altered mental status.  Patient arrives via EMS from home with altered mental status.  Son-in-law reports she has been in a recliner for the past 3 days and not responding well to the family.  EMS found her blood sugar to be 37.  She was given D10 with improvement in her blood sugar.  Patient denies any complaints at this time.  Past Medical History:  Diagnosis Date  . Cancer ([place])   . Cataract   . Chronic kidney disease (CKD), stage III (moderate) (HCC)   . Chronic urticaria   . COPD (chronic obstructive pulmonary disease) ([place])   . Depression   . Diabetes mellitus without complication ([place])   . DVT (deep venous thrombosis) (HCC)    left leg  . Hyperlipemia   . Hypertension   . Osteoarthritis   . Osteopenia   . Polymyalgia rheumatica ([place])   . Polyneuropathy   . Renal cancer ([place])   . Renal insufficiency 08/10/2016   Chart indicates CKD stage 3  . Restless leg syndrome   . Vitamin D deficiency   . Vulvar intraepithelial neoplasia with lichen sclerosus     Patient Active Problem List   Diagnosis Date Noted  . UTI (urinary tract infection) 03/15/2018  . Acute encephalopathy 12/19/2016  . Generalized weakness 12/19/2016  . Acute on chronic respiratory failure with hypoxia and hypercapnia ([place]) 12/16/2016  . COPD exacerbation ([place]) 12/16/2016  . Community acquired pneumonia 12/16/2016  . Lactic acidosis 12/16/2016  . Sepsis ([place]) 12/16/2016  . PE (pulmonary embolism) 08/11/2016  . COPD (chronic obstructive pulmonary  disease) ([place]) 03/29/2015  . Hypertension 03/29/2015  . Diabetes ([place]) 03/29/2015  . Hyperlipidemia 03/29/2015  . Depression 03/29/2015  . Restless leg syndrome 03/29/2015  . DVT (deep venous thrombosis) ([place]) 03/29/2015  . CKD (chronic kidney disease), stage III ([place]) 03/29/2015    Past Surgical History:  Procedure Laterality Date  . ABDOMINAL HYSTERECTOMY    . APPENDECTOMY    . CATARACT EXTRACTION    . COLONOSCOPY    . JOINT REPLACEMENT Bilateral    hip  . LAMINOTOMY    . NEPHRECTOMY    . TEMPORAL ARTERY BIOPSY / LIGATION    . TONSILLECTOMY      Allergies Fosamax [alendronate sodium] and Wellbutrin [bupropion]  Social History Social History   Tobacco Use  . Smoking status: Never Smoker  . Smokeless tobacco: Never Used  Substance Use Topics  . Alcohol use: No    Alcohol/week: 0.0 oz  . Drug use: No   Review of Systems Constitutional: Negative for fever. Cardiovascular: Negative for chest pain. Respiratory: Negative for shortness of breath. Gastrointestinal: Negative for abdominal pain, vomiting and diarrhea. Musculoskeletal: Negative for back pain. Skin: Negative for rash. Neurological: Positive for weakness  All systems negative/normal/unremarkable except as stated in the HPI  ____________________________________________   PHYSICAL EXAM:  VITAL SIGNS: ED Triage Vitals  Enc Vitals Group     BP 04/30/18 1200 (!) 179/60     Pulse Rate 04/30/18 1200 72     Resp 04/30/18 1200 18     Temp  04/30/18 1200 97.9 F (36.6 C)     Temp Source 04/30/18 1200 Oral     SpO2 04/30/18 1200 93 %     Weight 04/30/18 1157 200 lb (90.7 kg)     Height 04/30/18 1157 5' (1.524 m)     Head Circumference --      Peak Flow --      Pain Score 04/30/18 1157 0     Pain Loc --      Pain Edu? --      Excl. in [place]? --    Constitutional: Alert but disoriented.  No obvious distress, smells strongly of urine Eyes: Conjunctivae are normal. Normal extraocular movements. ENT    Head: Normocephalic and atraumatic.   Nose: No congestion/rhinnorhea.   Mouth/Throat: Mucous membranes are moist.   Neck: No stridor. Cardiovascular: Normal rate, regular rhythm. No murmurs, rubs, or gallops. Respiratory: Normal respiratory effort without tachypnea nor retractions. Breath sounds are clear and equal bilaterally. No wheezes/rales/rhonchi. Gastrointestinal: Soft and nontender. Normal bowel sounds Musculoskeletal: Nontender with normal range of motion in extremities. No lower extremity tenderness nor edema. Neurologic:  Normal speech and language. No gross focal neurologic deficits are appreciated.  Generalized weakness, nothing focal Skin:  Skin is warm, dry and intact.  Facial rashes noted Psychiatric: Mood and affect are normal. Speech and behavior are normal.  ____________________________________________  EKG: Interpreted by me.  Sinus rhythm the rate is 66 bpm, left bundle branch block, left axis deviation, long QT  ____________________________________________  ED COURSE:  As part of my medical decision making, I reviewed the following data within the [place] History obtained from family if available, nursing notes, old chart and ekg, as well as notes from prior ED visits. Patient presented for altered mental status, we will assess with labs and imaging as indicated at this time.   Procedures ____________________________________________   LABS (pertinent positives/negatives)  Labs Reviewed  CBC WITH DIFFERENTIAL/PLATELET  COMPREHENSIVE METABOLIC PANEL  TROPONIN I  URINALYSIS, COMPLETE (UACMP) WITH MICROSCOPIC  CK  CBG MONITORING, ED    RADIOLOGY Images were viewed by me  CT head, chest x-ray IMPRESSION: No acute abnormality. Stable cardiomegaly, bibasilar scarring and left basilar pleural fluid, thickening or prominent epicardial fat Pad. IMPRESSION: No acute intracranial abnormality. Negative for age non contrast  CT appearance of the brain. ____________________________________________  DIFFERENTIAL DIAGNOSIS   Dehydration, electro abnormality, hypoglycemia, CVA, MI, occult infection  FINAL ASSESSMENT AND PLAN  Altered mental status, hypoglycemia, cystitis   Plan: The patient had presented for altered mental status. Patient's labs this possibly reflective of UTI, she also has a small elevation in her white blood cell count.  Blood sugar appears to be improved at this point.  Patient's imaging does not reveal any acute process.  She probably warrants observation for her low blood sugar, altered mental status, weakness and likely developing UTI.  We did give IV Rocephin as well.   Laurence Aly, MD   Note: This note was generated in part or whole with voice recognition software. Voice recognition is usually quite accurate but there are transcription errors that can and very often do occur. I apologize for any typographical errors that were not detected and corrected.     Earleen Newport, MD 04/30/18 [id number]

## 2018-04-30 NOTE — ED Notes (Signed)
Patient transported to CT 

## 2018-04-30 NOTE — ED Notes (Signed)
Pt checked at this time. Pt is dry.

## 2018-04-30 NOTE — H&P (Signed)
[facility] [name]    MR#:  [id number]  DATE OF BIRTH:  [birth year]  DATE OF ADMISSION:  04/30/2018  PRIMARY CARE PHYSICIAN: [name], MD   REQUESTING/REFERRING PHYSICIAN:   CHIEF COMPLAINT:   Chief Complaint  Patient presents with  . Altered Mental Status    HISTORY OF PRESENT ILLNESS: Tracy Mcmahon  is a 84 y.o. female with a known history of diabetes mellitus type 2, deep venous thrombosis of left leg on Eliquis for anticoagulation, COPD, chronic kidney disease stage III, hypertension, hyperlipidemia, osteoarthritis, vitamin D deficiency was brought by EMS to the emergency room.  According to the patient's family she has been lethargic for the last couple of days and confused.  When EMS arrived at home blood sugar was 37 she was given IV D10 fluids 150 cc her blood sugar improved in mental status also improved.  In the emergency room patient is awake and alert and responds to all verbal commands.  She was worked up in the emergency room and found to have urinary tract infection too.  And also complains of dysuria and foul-smelling urine .Hospitalist service was consulted for further care.  No history of fall and head injury.  PAST MEDICAL HISTORY:   Past Medical History:  Diagnosis Date  . Cancer ([place])   . Cataract   . Chronic kidney disease (CKD), stage III (moderate) (HCC)   . Chronic urticaria   . COPD (chronic obstructive pulmonary disease) ([place])   . Depression   . Diabetes mellitus without complication ([place])   . DVT (deep venous thrombosis) (HCC)    left leg  . Hyperlipemia   . Hypertension   . Osteoarthritis   . Osteopenia   . Polymyalgia rheumatica ([place])   . Polyneuropathy   . Renal cancer ([place])   . Renal insufficiency 08/10/2016   Chart indicates CKD stage 3  . Restless leg syndrome   . Vitamin D deficiency   . Vulvar intraepithelial neoplasia with lichen sclerosus     PAST SURGICAL HISTORY:   Past Surgical History:  Procedure Laterality Date  . ABDOMINAL HYSTERECTOMY    . APPENDECTOMY    . CATARACT EXTRACTION    . COLONOSCOPY    . JOINT REPLACEMENT Bilateral    hip  . LAMINOTOMY    . NEPHRECTOMY    . TEMPORAL ARTERY BIOPSY / LIGATION    . TONSILLECTOMY      SOCIAL HISTORY:  Social History   Tobacco Use  . Smoking status: Never Smoker  . Smokeless tobacco: Never Used  Substance Use Topics  . Alcohol use: No    Alcohol/week: 0.0 oz    FAMILY HISTORY:  Family History  Problem Relation Age of Onset  . Hypertension Unknown   . Diabetes Unknown     DRUG ALLERGIES:  Allergies  Allergen Reactions  . Fosamax [Alendronate Sodium] Other (See Comments)    Reaction:  Made pt "very sick"  . Wellbutrin [Bupropion] Other (See Comments)    Reaction:  Unknown    REVIEW OF SYSTEMS:   CONSTITUTIONAL: No fever,has  fatigue and weakness.  EYES: No blurred or double vision.  EARS, NOSE, AND THROAT: No tinnitus or ear pain.  RESPIRATORY: No cough, shortness of breath, wheezing or hemoptysis.  CARDIOVASCULAR: No chest pain, orthopnea, edema.  GASTROINTESTINAL: No nausea, vomiting, diarrhea or abdominal pain.  GENITOURINARY: Has dysuria, no hematuria.  ENDOCRINE: No polyuria, nocturia,  HEMATOLOGY: No anemia, easy bruising  or bleeding SKIN: No rash or lesion. MUSCULOSKELETAL: No joint pain or arthritis.   NEUROLOGIC: No tingling, numbness, weakness.  PSYCHIATRY: No anxiety or depression.   MEDICATIONS AT HOME:  Prior to Admission medications   Medication Sig Start Date End Date Taking? Authorizing Provider  albuterol (PROVENTIL) (2.5 MG/3ML) 0.083% nebulizer solution Take 3 mLs (2.5 mg total) by nebulization every 4 (four) hours. 12/19/16  Yes Theodoro Grist, MD  alendronate (FOSAMAX) 35 MG tablet Take 35 mg by mouth every 7 (seven) days. Take with a full glass of water on an empty stomach.   Yes [provider]  apixaban (ELIQUIS) 5 MG TABS tablet Take 2  tablets (10 mg total) by mouth 2 (two) times daily. 2 tabs bid for 7 days then 1 tab bid Patient taking differently: Take 5 mg by mouth 2 (two) times daily. 2 tabs bid for 7 days then 1 tab bid 08/13/16  Yes Fritzi Mandes, MD  aspirin EC 81 MG tablet Take 81 mg by mouth daily.   Yes [provider]  atenolol (TENORMIN) 25 MG tablet Take 25 mg by mouth 2 (two) times daily.   Yes [provider]  diphenhydramine-acetaminophen (TYLENOL PM) 25-500 MG TABS tablet Take 1 tablet by mouth at bedtime as needed (sleep or pain).   Yes [provider]  FLUoxetine (PROZAC) 20 MG capsule Take 20 mg by mouth daily.   Yes [provider]  furosemide (LASIX) 20 MG tablet Take 20 mg by mouth daily.   Yes [provider]  gabapentin (NEURONTIN) 300 MG capsule Take 1 capsule (300MG ) by mouth every morning, 1 capsule (300MG ) every evening and 3 capsules (900MG ) at bedtime   Yes [provider]  insulin aspart (NOVOLOG) 100 UNIT/ML injection Inject 20u with full meals twice to three times daily as directed   Yes [provider]  insulin glargine (LANTUS) 100 UNIT/ML injection Inject 50u under the skin in two doses every morning (100u total)   Yes [provider]  lidocaine (LIDODERM) 5 % Place 1 patch onto the skin daily. Remove & Discard patch within 12 hours or as directed by MD    [provider]  Multiple Minerals-Vitamins (GNP CAL MAG ZINC +D3 PO) Take 1 tablet by mouth daily.    [provider]  pravastatin (PRAVACHOL) 20 MG tablet Take 20 mg by mouth every evening.     [provider]  predniSONE (DELTASONE) 5 MG tablet Take 7.5 mg by mouth daily.     [provider]  tiotropium (SPIRIVA) 18 MCG inhalation capsule Place 1 capsule (18 mcg total) into inhaler and inhale daily. 12/20/16   Theodoro Grist, MD  vitamin B-12 (CYANOCOBALAMIN) 1000 MCG tablet Take 1,000 mcg by mouth daily.    [provider]   Vitamin D, Ergocalciferol, (DRISDOL) 50000 UNITS CAPS capsule Take 50,000 Units by mouth every Monday.     [provider]      PHYSICAL EXAMINATION:   VITAL SIGNS: Blood pressure (!) 142/81, pulse 71, temperature 97.9 F (36.6 C), temperature source Oral, resp. rate 14, height 5' (1.524 m), weight 90.7 kg (200 lb), SpO2 100 %.  GENERAL:  84 y.o.-year-old patient lying in the bed with no acute distress.  EYES: Pupils equal, round, reactive to light and accommodation. No scleral icterus. Extraocular muscles intact.  HEENT: Head atraumatic, normocephalic. Oropharynx dry and nasopharynx clear.  NECK:  Supple, no jugular venous distention. No thyroid enlargement, no tenderness.  LUNGS: Normal breath sounds  bilaterally, no wheezing, rales,rhonchi or crepitation. No use of accessory muscles of respiration.  CARDIOVASCULAR: S1, S2 normal. No murmurs, rubs, or gallops.  ABDOMEN: Soft, nontender, nondistended. Bowel sounds present. No organomegaly or mass.  EXTREMITIES: No pedal edema, cyanosis, or clubbing.  NEUROLOGIC: Cranial nerves II through XII are intact. Muscle strength 5/5 in all extremities. Sensation intact. Gait not checked.  PSYCHIATRIC: The patient is alert and oriented x 3.  SKIN: No obvious rash, lesion, or ulcer.   LABORATORY PANEL:   CBC Recent Labs  Lab 04/30/18 1210  WBC 11.7*  HGB 10.6*  HCT 33.9*  PLT 332  MCV 87.1  MCH 27.3  MCHC 31.4*  RDW 17.0*  LYMPHSABS 0.9*  MONOABS 0.7  EOSABS 0.1  BASOSABS 0.1   ------------------------------------------------------------------------------------------------------------------  Chemistries  Recent Labs  Lab 04/30/18 1210  NA 137  K 3.7  CL 95*  CO2 32  GLUCOSE 176*  BUN 22*  CREATININE 1.17*  CALCIUM 8.8*  AST 26  ALT 14  ALKPHOS 58  BILITOT 0.5   ------------------------------------------------------------------------------------------------------------------ estimated creatinine clearance is  34.7 mL/min (A) (by C-G formula based on SCr of 1.17 mg/dL (H)). ------------------------------------------------------------------------------------------------------------------ No results for input(s): TSH, T4TOTAL, T3FREE, THYROIDAB in the last 72 hours.  Invalid input(s): FREET3   Coagulation profile No results for input(s): INR, PROTIME in the last 168 hours. ------------------------------------------------------------------------------------------------------------------- No results for input(s): DDIMER in the last 72 hours. -------------------------------------------------------------------------------------------------------------------  Cardiac Enzymes Recent Labs  Lab 04/30/18 1210  TROPONINI <0.03   ------------------------------------------------------------------------------------------------------------------ Invalid input(s): POCBNP  ---------------------------------------------------------------------------------------------------------------  Urinalysis    Component Value Date/Time   COLORURINE YELLOW (A) 04/30/2018 1210   APPEARANCEUR CLOUDY (A) 04/30/2018 1210   APPEARANCEUR Cloudy 09/10/2014 1547   LABSPEC 1.019 04/30/2018 1210   LABSPEC 1.013 09/10/2014 1547   PHURINE 5.0 04/30/2018 1210   GLUCOSEU NEGATIVE 04/30/2018 1210   GLUCOSEU Negative 09/10/2014 1547   HGBUR SMALL (A) 04/30/2018 1210   BILIRUBINUR NEGATIVE 04/30/2018 1210   BILIRUBINUR Negative 09/10/2014 1547   KETONESUR NEGATIVE 04/30/2018 1210   PROTEINUR NEGATIVE 04/30/2018 1210   NITRITE POSITIVE (A) 04/30/2018 1210   LEUKOCYTESUR NEGATIVE 04/30/2018 1210   LEUKOCYTESUR 3+ 09/10/2014 1547     RADIOLOGY: Dg Chest 2 View  Result Date: 04/30/2018 CLINICAL DATA:  Altered mental status for the past 3 days. EXAM: CHEST - 2 VIEW COMPARISON:  12/16/2016 and 08/10/2016. FINDINGS: Stable enlarged cardiac silhouette. Small amount of linear density in both lower lung zones. Stable pleural  thickening or fluid or prominent epicardial fat pad at the left lung base. Stable prominent epicardial fat pad obscuring the inferior right heart borders. Diffuse osteopenia. Bilateral shoulder degenerative changes, greater on the right. IMPRESSION: No acute abnormality. Stable cardiomegaly, bibasilar scarring and left basilar pleural fluid, thickening or prominent epicardial fat pad. Electronically Signed   By: Claudie Revering M.D.   On: 04/30/2018 12:58   Ct Head Wo Contrast  Result Date: 04/30/2018 CLINICAL DATA:  84 year old female with altered mental status, hypoglycemia. EXAM: CT HEAD WITHOUT CONTRAST TECHNIQUE: Contiguous axial images were obtained from the base of the skull through the vertex without intravenous contrast. COMPARISON:  Brain MRI 08/21/2013, head CT 01/13/2012. FINDINGS: Brain: No midline shift, ventriculomegaly, mass effect, evidence of mass lesion, intracranial hemorrhage or evidence of cortically based acute infarction. Gray-white matter differentiation is within normal limits for age throughout the brain. Vascular: Calcified atherosclerosis at the skull base. No suspicious intracranial vascular hyperdensity. Skull: No acute osseous abnormality identified. Sinuses/Orbits: Visualized paranasal sinuses and mastoids are  stable and well pneumatized. Other: No acute orbit or scalp soft tissue findings. IMPRESSION: No acute intracranial abnormality. Negative for age non contrast CT appearance of the brain. Electronically Signed   By: Genevie Ann M.D.   On: 04/30/2018 12:39    EKG: Orders placed or performed during the hospital encounter of 04/30/18  . ED EKG  . ED EKG  . EKG 12-Lead  . EKG 12-Lead    IMPRESSION AND PLAN:  84 year old elderly female patient with history of diabetes mellitus type 2 DVT left leg on Eliquis for anticoagulation, COPD, chronic kidney disease stage III, hypertension, hyperlipidemia, osteoarthritis presented to the emergency room for confusion and lethargy and  low blood sugar.  -Hypoglycemia Hold diabetic meds for now (lantus insulin and novolog insulin ) IV fluids with D5 normal saline Follow blood sugars  -Altered mental status secondary to hypoglycemia and UTI Improved  -Urinary tract infection Start patient on IV Rocephin antibiotic Follow-up urine culture  -History of DVT Continue anticoagulation with oral Eliquis  -COPD stable  - CKD stage III Monitor renal function  All the records are reviewed and case discussed with ED provider. Management plans discussed with the patient, family and they are in agreement.  CODE STATUS: Full code Code Status History    Date Active Date Inactive Code Status Order ID Comments User Context   03/15/2018 2153 03/17/2018 1803 Full Code [id number]  Lance Coon, MD Inpatient   12/17/2016 0125 12/19/2016 1856 Full Code [id number]  Theodoro Grist, MD Inpatient   08/11/2016 0324 08/11/2016 0937 Full Code [id number]  Lance Coon, MD Inpatient   03/29/2015 2115 03/30/2015 1555 Full Code [id number]  Nicholes Mango, MD Inpatient       TOTAL TIME TAKING CARE OF THIS PATIENT: 50 minutes.    Saundra Shelling M.D on 04/30/2018 at 2:14 PM  Between 7am to 6pm - Pager - [phone]  After 6pm go to [email or web address] - password [email or web address]  [place] [facility]  Office  [phone]  CC: Primary care physician; [name], MD

## 2018-04-30 NOTE — ED Notes (Signed)
Pt back from CT

## 2018-05-01 DIAGNOSIS — R4182 Altered mental status, unspecified: Secondary | ICD-10-CM | POA: Diagnosis not present

## 2018-05-01 DIAGNOSIS — N390 Urinary tract infection, site not specified: Secondary | ICD-10-CM | POA: Diagnosis not present

## 2018-05-01 DIAGNOSIS — E162 Hypoglycemia, unspecified: Secondary | ICD-10-CM | POA: Diagnosis not present

## 2018-05-01 DIAGNOSIS — Z86718 Personal history of other venous thrombosis and embolism: Secondary | ICD-10-CM | POA: Diagnosis not present

## 2018-05-01 LAB — GLUCOSE, CAPILLARY
GLUCOSE-CAPILLARY: 106 mg/dL — AB (ref 65–99)
GLUCOSE-CAPILLARY: 117 mg/dL — AB (ref 65–99)
GLUCOSE-CAPILLARY: 232 mg/dL — AB (ref 65–99)
GLUCOSE-CAPILLARY: 208 mg/dL — AB (ref 65–99)
Glucose-Capillary: 147 mg/dL — ABNORMAL HIGH (ref 65–99)
Glucose-Capillary: 108 mg/dL — ABNORMAL HIGH (ref 65–99)
Glucose-Capillary: 98 mg/dL (ref 65–99)
Glucose-Capillary: 75 mg/dL (ref 65–99)
Glucose-Capillary: 111 mg/dL — ABNORMAL HIGH (ref 65–99)

## 2018-05-01 LAB — BASIC METABOLIC PANEL
ANION GAP: 6 (ref 5–15)
BUN: 17 mg/dL (ref 6–20)
CALCIUM: 8.2 mg/dL — AB (ref 8.9–10.3)
CO2: 31 mmol/L (ref 22–32)
Chloride: 106 mmol/L (ref 101–111)
Creatinine, Ser: 1.16 mg/dL — ABNORMAL HIGH (ref 0.44–1.00)
GFR calc Af Amer: 48 mL/min — ABNORMAL LOW (ref >60)
GFR, EST NON AFRICAN AMERICAN: 41 mL/min — AB (ref >60)
GLUCOSE: 111 mg/dL — AB (ref 65–99)
Potassium: 3.9 mmol/L (ref 3.5–5.1)
Sodium: 143 mmol/L (ref 135–145)

## 2018-05-01 LAB — CBC
HCT: 30.8 % — ABNORMAL LOW (ref 35.0–47.0)
Hemoglobin: 9.7 g/dL — ABNORMAL LOW (ref 12.0–16.0)
MCH: 27.8 pg (ref 26.0–34.0)
MCHC: 31.6 g/dL — ABNORMAL LOW (ref 32.0–36.0)
MCV: 88 fL (ref 80.0–100.0)
PLATELETS: 279 10*3/uL (ref 150–440)
RBC: 3.5 MIL/uL — AB (ref 3.80–5.20)
RDW: 16.7 % — AB (ref 11.5–14.5)
WBC: 8.2 10*3/uL (ref 3.6–11.0)

## 2018-05-01 MED ORDER — INSULIN ASPART 100 UNIT/ML ~~LOC~~ SOLN
0.0000 [IU] | SUBCUTANEOUS | Status: DC
  Administered 2018-05-01 – 2018-05-02 (×3): 3 [IU] via SUBCUTANEOUS
  Administered 2018-05-02 (×2): 2 [IU] via SUBCUTANEOUS
  Administered 2018-05-02 – 2018-05-03 (×2): 1 [IU] via SUBCUTANEOUS
  Filled 2018-05-01 (×8): qty 1

## 2018-05-01 NOTE — Care Management Obs Status (Signed)
MEDICARE OBSERVATION STATUS NOTIFICATION   Patient Details  [name] MRN: [id number] Date of Birth: Apr 25, 1929   Medicare Observation Status Notification Given:  Yes(Patient only alert to person and place.  Telephone reivewed with daughter [name])    Beverly Sessions, RN 05/01/2018, 10:50 AM

## 2018-05-01 NOTE — Progress Notes (Signed)
Inpatient Diabetes Program Recommendations  AACE/ADA: New Consensus Statement on Inpatient Glycemic Control (2015)  Target Ranges:  Prepandial:   less than 140 mg/dL      Peak postprandial:   less than 180 mg/dL (1-2 hours)      Critically ill patients:  140 - 180 mg/dL   Results for [name], [name] (MRN [id number]) as of 05/01/2018 12:11  Ref. Range 04/30/2018 23:30 05/01/2018 02:04 05/01/2018 05:15 05/01/2018 06:55 05/01/2018 07:35 05/01/2018 10:07 05/01/2018 11:35  Glucose-Capillary Latest Ref Range: 65 - 99 mg/dL 141 (H) 147 (H) 108 (H) 106 (H) 98 75 117 (H)   Admit UTI/ Hypoglycemia  History: DM, CKD, COPD  Home DM Meds: Lantus 100 QHS (2 injections)        Novolog 20 units TID  Current Insulin Orders: None- Checking CBGs Q2H     Getting Prednisone 7.5 mg daily.  No more episodes of Hypoglycemia since yesterday at 3pm.  Renal function relatively unchanged since April admission.  Last saw her PCP on 03/25/2018 (Dr. Raechel Ache with [facility]).  No changes made to pt's insulin regimen at that visit.  Per PCP notes, however, patient was having issues with Hypoglycemia prior to that PCP office visit.     MD- Given pt's current CBGs are stable, do not have any recommendations to start insulin in hospital yet.  Patient needs to follow up with PCP soon after discharge to discuss insulin regimen.  Likely needs reduction of home doses of insulin.  When patient ready to discharge home, recommend reducing home insulins by 50% and have pt follow up with PCP next week.     --Will follow patient during hospitalization--  Wyn Quaker RN, MSN, CDE Diabetes Coordinator Inpatient Glycemic Control Team Team Pager: [phone] (8a-5p)

## 2018-05-01 NOTE — Progress Notes (Signed)
Tracy Mcmahon at [place] [name]    MR#:  [id number]  DATE OF BIRTH:  Jan 25, 1929  SUBJECTIVE:  CHIEF COMPLAINT: Patient is still confused but better than yesterday according to the daughter at bedside  REVIEW OF SYSTEMS:  Limited review of systems   cONSTITUTIONAL: No fever, fatigue or weakness.  EYES: No blurred or double vision.  EARS, NOSE, AND THROAT: No tinnitus or ear pain.  RESPIRATORY: No cough, shortness of breath, wheezing or hemoptysis.  CARDIOVASCULAR: No chest pain, orthopnea, edema.  MUSCULOSKELETAL: No joint pain or arthritis.   NEUROLOGIC: No tingling, numbness, weakness.  PSYCHIATRY: No anxiety or depression.   DRUG ALLERGIES:   Allergies  Allergen Reactions  . Fosamax [Alendronate Sodium] Other (See Comments)    Reaction:  Made pt "very sick"  . Wellbutrin [Bupropion] Other (See Comments)    Reaction:  Unknown    VITALS:  Blood pressure (!) 133/49, pulse 63, temperature 98 F (36.7 C), temperature source Oral, resp. rate 17, height 5' (1.524 m), weight 90.7 kg (200 lb), SpO2 100 %.  PHYSICAL EXAMINATION:  GENERAL:  85 y.o.-year-old patient lying in the bed with no acute distress.  EYES: Pupils equal, round, reactive to light and accommodation. No scleral icterus. Extraocular muscles intact.  HEENT: Head atraumatic, normocephalic. Oropharynx and nasopharynx clear.  NECK:  Supple, no jugular venous distention. No thyroid enlargement, no tenderness.  LUNGS: Normal breath sounds bilaterally, no wheezing, rales,rhonchi or crepitation. No use of accessory muscles of respiration.  CARDIOVASCULAR: S1, S2 normal. No murmurs, rubs, or gallops.  ABDOMEN: Soft, nontender, nondistended. Bowel sounds present. No organomegaly or mass.  EXTREMITIES: No pedal edema, cyanosis, or clubbing.  NEUROLOGIC: Cranial nerves II through XII are intact. Muscle strength at her baseline in all extremities. Sensation intact. Gait  not checked.  PSYCHIATRIC: The patient is alert and oriented x 2-3.  SKIN: No obvious rash, lesion, or ulcer.    LABORATORY PANEL:   CBC Recent Labs  Lab 05/01/18 0522  WBC 8.2  HGB 9.7*  HCT 30.8*  PLT 279   ------------------------------------------------------------------------------------------------------------------  Chemistries  Recent Labs  Lab 04/30/18 1210 05/01/18 0522  NA 137 143  K 3.7 3.9  CL 95* 106  CO2 32 31  GLUCOSE 176* 111*  BUN 22* 17  CREATININE 1.17* 1.16*  CALCIUM 8.8* 8.2*  AST 26  --   ALT 14  --   ALKPHOS 58  --   BILITOT 0.5  --    ------------------------------------------------------------------------------------------------------------------  Cardiac Enzymes Recent Labs  Lab 04/30/18 1210  TROPONINI <0.03   ------------------------------------------------------------------------------------------------------------------  RADIOLOGY:  Dg Chest 2 View  Result Date: 04/30/2018 CLINICAL DATA:  Altered mental status for the past 3 days. EXAM: CHEST - 2 VIEW COMPARISON:  12/16/2016 and 08/10/2016. FINDINGS: Stable enlarged cardiac silhouette. Small amount of linear density in both lower lung zones. Stable pleural thickening or fluid or prominent epicardial fat pad at the left lung base. Stable prominent epicardial fat pad obscuring the inferior right heart borders. Diffuse osteopenia. Bilateral shoulder degenerative changes, greater on the right. IMPRESSION: No acute abnormality. Stable cardiomegaly, bibasilar scarring and left basilar pleural fluid, thickening or prominent epicardial fat pad. Electronically Signed   By: Claudie Revering M.D.   On: 04/30/2018 12:58   Ct Head Wo Contrast  Result Date: 04/30/2018 CLINICAL DATA:  84 year old female with altered mental status, hypoglycemia. EXAM: CT HEAD WITHOUT CONTRAST TECHNIQUE: Contiguous axial images were obtained from the base of  the skull through the vertex without intravenous contrast.  COMPARISON:  Brain MRI 08/21/2013, head CT 01/13/2012. FINDINGS: Brain: No midline shift, ventriculomegaly, mass effect, evidence of mass lesion, intracranial hemorrhage or evidence of cortically based acute infarction. Gray-white matter differentiation is within normal limits for age throughout the brain. Vascular: Calcified atherosclerosis at the skull base. No suspicious intracranial vascular hyperdensity. Skull: No acute osseous abnormality identified. Sinuses/Orbits: Visualized paranasal sinuses and mastoids are stable and well pneumatized. Other: No acute orbit or scalp soft tissue findings. IMPRESSION: No acute intracranial abnormality. Negative for age non contrast CT appearance of the brain. Electronically Signed   By: Genevie Ann M.D.   On: 04/30/2018 12:39    EKG:   Orders placed or performed during the hospital encounter of 04/30/18  . ED EKG  . ED EKG  . EKG 12-Lead  . EKG 12-Lead    ASSESSMENT AND PLAN:    84 year old elderly female patient with history of diabetes mellitus type 2 DVT left leg on Eliquis for anticoagulation, COPD, chronic kidney disease stage III, hypertension, hyperlipidemia, osteoarthritis presented to the emergency room for confusion and lethargy and low blood sugar.  -Hypoglycemia could be from UTI causing altered mental status ?  Overdosed on her diabetic medication Hold diabetic meds for now (lantus insulin and novolog insulin ) IV fluids with D5 normal saline Follow blood sugars  -Altered mental status secondary to hypoglycemia and UTI Improving, close monitoring  -Urinary tract infection  patient on IV Rocephin antibiotic Follow-up urine culture  -History of DVT Continue anticoagulation with oral Eliquis  -COPD stable  - CKD stage III Monitor renal function  -Generalized weakness PT consult   -Disposition: Patient was living with her daughter who is currently in the [facility], other daughter cannot take her home Social worker  regarding disposition plans   All the records are reviewed and case discussed with Care Management/Social Workerr. Management plans discussed with the patient, family and they are in agreement.  CODE STATUS:  fc   TOTAL TIME TAKING CARE OF THIS PATIENT: 35  minutes.   POSSIBLE D/C IN 1-2 DAYS, DEPENDING ON CLINICAL CONDITION.  Note: This dictation was prepared with Dragon dictation along with smaller phrase technology. Any transcriptional errors that result from this process are unintentional.   Nicholes Mango M.D on 05/01/2018 at 2:09 PM  Between 7am to 6pm - Pager - [phone] After 6pm go to [email or web address] - password [email or web address] [facility]  [place] [facility]  Office  [phone]  CC: Primary care physician; [name], MD

## 2018-05-01 NOTE — Progress Notes (Signed)
Prime doctor notified that pt.'s last CBG 232, pt is eating well, and is having great intake of liquids. New orders to discontinue D5%-0.9% continuous fluids and place pt on sensitive sliding scale coverage of insulin. Pt and pt.'s family updated. Will continue to monitor pt.   Kynleigh Artz [employer]

## 2018-05-01 NOTE — Care Management (Addendum)
Patient admitted from home with Hypoglycemia.  Patient alert and oriented to self and place only.  Assessment completed with Patient only alert to person and place.  Assessment completed via telephone with daughter Tracy Mcmahon who is POA.  Patient is actually inpatient at another hospital and can be reached at [phone])  Patient lives in the home with Tracy Mcmahon and her husband.  PCP Theis.  Patient has a RW, WC, BSC.  Previous admission family declined home health at discharge.    RNCM inquired where patient will be discharging to when medically stable, as her daughter is in the hospital.  Tracy Mcmahon states that she will either to her other daughters Tracy Mcmahon) Or she will be returning back to [place] home.  Tracy Mcmahon is in agreement to have home health this admission, and does not have an agency preference.  Heads up referral made to [facility] with [facility].     Update:  Daughters Tracy Mcmahon and Tracy Mcmahon at bedside, as well as granddaughter.  They all express concerns that patient lives in a hoarding environment . Family is going to discuss over night which home patient will discharge to.  Plan is still for home health, in either the patient's home, or her daughter Becky's home. The long term plan is as follows.... When daughter Tracy Mcmahon who is currently at [facility] is discharged, the plan is for her to go to rehab.  After Tracy Mcmahon is discharged from rehab she is going to move to [place] with family, and this patient would be moving with her. Family informed that patient would have to have her portable O2 for transportation at discharge.

## 2018-05-02 DIAGNOSIS — N390 Urinary tract infection, site not specified: Secondary | ICD-10-CM | POA: Diagnosis not present

## 2018-05-02 DIAGNOSIS — E162 Hypoglycemia, unspecified: Secondary | ICD-10-CM | POA: Diagnosis not present

## 2018-05-02 DIAGNOSIS — Z86718 Personal history of other venous thrombosis and embolism: Secondary | ICD-10-CM | POA: Diagnosis not present

## 2018-05-02 DIAGNOSIS — R4182 Altered mental status, unspecified: Secondary | ICD-10-CM | POA: Diagnosis not present

## 2018-05-02 LAB — URINE CULTURE
Culture: >=100000 — AB
SPECIAL REQUESTS: NORMAL

## 2018-05-02 LAB — GLUCOSE, CAPILLARY
GLUCOSE-CAPILLARY: 183 mg/dL — AB (ref 65–99)
GLUCOSE-CAPILLARY: 196 mg/dL — AB (ref 65–99)
GLUCOSE-CAPILLARY: 209 mg/dL — AB (ref 65–99)
Glucose-Capillary: 148 mg/dL — ABNORMAL HIGH (ref 65–99)
Glucose-Capillary: 136 mg/dL — ABNORMAL HIGH (ref 65–99)
Glucose-Capillary: 90 mg/dL (ref 65–99)

## 2018-05-02 MED ORDER — SODIUM CHLORIDE 0.9% FLUSH
3.0000 mL | Freq: Two times a day (BID) | INTRAVENOUS | Status: DC
  Administered 2018-05-03 (×2): 3 mL via INTRAVENOUS

## 2018-05-02 MED ORDER — HYDRALAZINE HCL 20 MG/ML IJ SOLN
10.0000 mg | Freq: Four times a day (QID) | INTRAMUSCULAR | Status: DC | PRN
  Administered 2018-05-02 – 2018-05-03 (×2): 10 mg via INTRAVENOUS
  Filled 2018-05-02 (×2): qty 1

## 2018-05-02 MED ORDER — HALOPERIDOL LACTATE 5 MG/ML IJ SOLN
1.0000 mg | Freq: Four times a day (QID) | INTRAMUSCULAR | Status: DC | PRN
  Filled 2018-05-02: qty 0.2

## 2018-05-02 MED ORDER — RISPERIDONE 0.5 MG PO TABS
0.5000 mg | ORAL_TABLET | Freq: Once | ORAL | Status: AC
  Administered 2018-05-02: 0.5 mg via ORAL
  Filled 2018-05-02: qty 1

## 2018-05-02 MED ORDER — AMLODIPINE BESYLATE 5 MG PO TABS
2.5000 mg | ORAL_TABLET | Freq: Every day | ORAL | Status: DC
  Administered 2018-05-02 – 2018-05-03 (×2): 2.5 mg via ORAL
  Filled 2018-05-02 (×2): qty 1

## 2018-05-02 NOTE — Plan of Care (Signed)
  Problem: Clinical Measurements: Goal: Ability to maintain clinical measurements within normal limits will improve Outcome: Progressing Goal: Diagnostic test results will improve Outcome: Progressing Goal: Respiratory complications will improve Outcome: Progressing   Problem: Activity: Goal: Risk for activity intolerance will decrease Outcome: Progressing   Problem: Pain Managment: Goal: General experience of comfort will improve Outcome: Progressing

## 2018-05-02 NOTE — Progress Notes (Signed)
[place] at [place] [name]    MR#:  [id number]  DATE OF BIRTH:  1928/12/14  SUBJECTIVE:  CHIEF COMPLAINT:   Chief Complaint  Patient presents with  . Altered Mental Status   -Patient admitted with UTI and hypoglycemia.  Urine cultures growing E. coli and Klebsiella.  On antibiotics. -Very delirious this morning  REVIEW OF SYSTEMS:  Review of Systems  Unable to perform ROS: Mental status change    DRUG ALLERGIES:   Allergies  Allergen Reactions  . Fosamax [Alendronate Sodium] Other (See Comments)    Reaction:  Made pt "very sick"  . Wellbutrin [Bupropion] Other (See Comments)    Reaction:  Unknown    VITALS:  Blood pressure (!) 181/59, pulse 78, temperature 98.1 F (36.7 C), temperature source Oral, resp. rate 17, height 5' (1.524 m), weight 90.7 kg (200 lb), SpO2 93 %.  PHYSICAL EXAMINATION:  Physical Exam  GENERAL:  84 y.o.-year-old elderly patient lying in the bed with no acute distress.  EYES: Pupils equal, round, reactive to light and accommodation. No scleral icterus. Extraocular muscles intact.  HEENT: Head atraumatic, normocephalic. Oropharynx and nasopharynx clear.  NECK:  Supple, no jugular venous distention. No thyroid enlargement, no tenderness.  LUNGS: Normal breath sounds bilaterally, no wheezing, rales,rhonchi or crepitation. No use of accessory muscles of respiration.  CARDIOVASCULAR: S1, S2 normal. No rubs, or gallops.  2/6 systolic murmur is present ABDOMEN: Soft, nontender, nondistended. Bowel sounds present. No organomegaly or mass.  EXTREMITIES: No pedal edema, cyanosis, or clubbing.  NEUROLOGIC: Cranial nerves II through XII are intact. Muscle strength 5/5 in all extremities. Sensation intact. Gait not checked.  PSYCHIATRIC: The patient is alert and oriented to self, delirious and paranoid SKIN: No obvious rash, lesion, or ulcer.    LABORATORY PANEL:   CBC Recent Labs  Lab  05/01/18 0522  WBC 8.2  HGB 9.7*  HCT 30.8*  PLT 279   ------------------------------------------------------------------------------------------------------------------  Chemistries  Recent Labs  Lab 04/30/18 1210 05/01/18 0522  NA 137 143  K 3.7 3.9  CL 95* 106  CO2 32 31  GLUCOSE 176* 111*  BUN 22* 17  CREATININE 1.17* 1.16*  CALCIUM 8.8* 8.2*  AST 26  --   ALT 14  --   ALKPHOS 58  --   BILITOT 0.5  --    ------------------------------------------------------------------------------------------------------------------  Cardiac Enzymes Recent Labs  Lab 04/30/18 1210  TROPONINI <0.03   ------------------------------------------------------------------------------------------------------------------  RADIOLOGY:  Dg Chest 2 View  Result Date: 04/30/2018 CLINICAL DATA:  Altered mental status for the past 3 days. EXAM: CHEST - 2 VIEW COMPARISON:  12/16/2016 and 08/10/2016. FINDINGS: Stable enlarged cardiac silhouette. Small amount of linear density in both lower lung zones. Stable pleural thickening or fluid or prominent epicardial fat pad at the left lung base. Stable prominent epicardial fat pad obscuring the inferior right heart borders. Diffuse osteopenia. Bilateral shoulder degenerative changes, greater on the right. IMPRESSION: No acute abnormality. Stable cardiomegaly, bibasilar scarring and left basilar pleural fluid, thickening or prominent epicardial fat pad. Electronically Signed   By: Claudie Revering M.D.   On: 04/30/2018 12:58   Ct Head Wo Contrast  Result Date: 04/30/2018 CLINICAL DATA:  84 year old female with altered mental status, hypoglycemia. EXAM: CT HEAD WITHOUT CONTRAST TECHNIQUE: Contiguous axial images were obtained from the base of the skull through the vertex without intravenous contrast. COMPARISON:  Brain MRI 08/21/2013, head CT 01/13/2012. FINDINGS: Brain: No midline shift, ventriculomegaly, mass effect,  evidence of mass lesion, intracranial hemorrhage  or evidence of cortically based acute infarction. Gray-white matter differentiation is within normal limits for age throughout the brain. Vascular: Calcified atherosclerosis at the skull base. No suspicious intracranial vascular hyperdensity. Skull: No acute osseous abnormality identified. Sinuses/Orbits: Visualized paranasal sinuses and mastoids are stable and well pneumatized. Other: No acute orbit or scalp soft tissue findings. IMPRESSION: No acute intracranial abnormality. Negative for age non contrast CT appearance of the brain. Electronically Signed   By: Genevie Ann M.D.   On: 04/30/2018 12:39    EKG:   Orders placed or performed during the hospital encounter of 04/30/18  . ED EKG  . ED EKG  . EKG 12-Lead  . EKG 12-Lead    ASSESSMENT AND PLAN:   84 year old female with past medical history significant for CKD, COPD, history of DVT on Eliquis, hypertension, diabetes presents from home secondary to altered mental status.  1.  Acute metabolic encephalopathy-secondary to hypoglycemia and UTI on admission. -With improved sugars and starting on antibiotics, her confusion was better yesterday. -Now has acute delirium.  Started Haldol as needed.  2.  UTI-cultures growing E. coli and Klebsiella.  Currently on Rocephin.  Will continue  3.  Diabetes mellitus with hypoglycemia-hold Lantus for now.  We will add sliding scale insulin  4.  History of DVT in September 2017 -Continue Eliquis for now  5.  Peripheral neuropathy-continue gabapentin, home dose  6.  DVT prophylaxis-already on Eliquis    All the records are reviewed and case discussed with Care Management/Social Workerr. Management plans discussed with the patient, family and they are in agreement.  CODE STATUS: Full code  TOTAL TIME TAKING CARE OF THIS PATIENT: 38 minutes.   POSSIBLE D/C IN 1-2 DAYS, DEPENDING ON CLINICAL CONDITION.   Gladstone Lighter M.D on 05/02/2018 at 12:17 PM  Between 7am to 6pm - Pager -  [phone]  After 6pm go to [email or web address] - password [email or web address] [place] [facility]  Office  [phone]  CC: Primary care physician; [name], MD

## 2018-05-02 NOTE — Evaluation (Signed)
Physical Therapy Evaluation Patient Details [name] MRN: [id number] DOB: 1929-02-16 Today's Date: 05/02/2018   History of Present Illness  84 year old elderly female patient with history of diabetes mellitus type 2 DVT left leg on Eliquis for anticoagulation, COPD, chronic kidney disease stage III, hypertension, hyperlipidemia, osteoarthritis presented to the emergency room for confusion and lethargy and low blood sugar. Patient repeats questions frequently. Reports lives with her daughter and son in law . Does use walker for longer duration ambulation in home and out to car.   Clinical Impression  Patient is a pleasant 84 year old female who was admitted due to hypoglycemia. Upon evaluation patient was found in chair finishing breakfast with 2L 02 via nasal cannula. Patient frequently repeated questions demonstrating limited short term recall. Sit to stand transfers performed with supervision/CGA and utilization of walker. Pt. Refused to ambulate outside of room however ambulated ~10 ft within room demonstrating ability to turn L and R without LOB. Patient returned to chair after ambulation reporting fatigue. Patient requires supervision with mobility and reports need for assistance with iADLs demonstrating need for 24 hour care/assistance. Patient's limited capacity for functional mobility indicates home health would be beneficial to decrease patient's fall risk and improve functional mobility.     Follow Up Recommendations Home health PT;Supervision/Assistance - 24 hour    Equipment Recommendations       Recommendations for Other Services       Precautions / Restrictions Precautions Precautions: Fall Precaution Comments: Hx of falls, however reports none in the recent past Restrictions Weight Bearing Restrictions: No Other Position/Activity Restrictions: ambulate with RW      Mobility  Bed Mobility                  Transfers Overall transfer level: Modified  independent Equipment used: Standard walker             General transfer comment: CGA with STS and SPT with RW. Patient utilizes one hand on surface and one hand on walker for transfer   Ambulation/Gait Ambulation/Gait assistance: Modified independent (Device/Increase time);Supervision Ambulation Distance (Feet): 12 Feet Assistive device: Standard walker Gait Pattern/deviations: Step-through pattern;Decreased stride length;Shuffle   Gait velocity interpretation: <1.31 ft/sec, indicative of household ambulator General Gait Details: Patient demonstrates limited stride length with step through shuffle steps and trunk flexion resulting in slower ambulatory velocity with RW.   Stairs            Wheelchair Mobility    Modified Rankin (Stroke Patients Only)       Balance Overall balance assessment: Needs assistance Sitting-balance support: No upper extremity supported;Feet supported Sitting balance-Leahy Scale: Good Sitting balance - Comments: Reaches within and outside of BOS without LOB   Standing balance support: Bilateral upper extremity supported Standing balance-Leahy Scale: Fair Standing balance comment: Requires BUE support with ambulation and dynamic activities.              High level balance activites: Turns High Level Balance Comments: Able to turn with walker without LOB to L and to R. Ambulates without LOB.              Pertinent Vitals/Pain Pain Assessment: 0-10 Pain Score: 3  Pain Location: Back(pain when leaning back in chair) Pain Descriptors / Indicators: Aching Pain Intervention(s): Monitored during session    Home Living Family/patient expects to be discharged to:: Private residence Living Arrangements: Alone;Children(reports living with son in law and daughter) Available Help at Discharge: Family Type of Home: House Home Access:  Stairs to enter Entrance Stairs-Rails: Left Entrance Stairs-Number of Steps: 2(with additional one small  step into threshold) Home Layout: One level Home Equipment: Walker - 2 wheels;Wheelchair - [employer];Tub bench      Prior Function Level of Independence: Needs assistance   Gait / Transfers Assistance Needed: Pt Mod Ind with amb in home only and only for short funtional distances like toileting, sits most of the day; uses a w/c in the community  ADL's / Homemaking Assistance Needed: Daughter assists with ADLs  Comments: 2L 02 24/7     Hand Dominance        Extremity/Trunk Assessment   Upper Extremity Assessment Upper Extremity Assessment: Generalized weakness(RUE appears WFL, LUE limited in overhead flexion)    Lower Extremity Assessment Lower Extremity Assessment: Generalized weakness(R and LLE 4/5 with adduction 4-/5 bilaterally)    Cervical / Trunk Assessment Cervical / Trunk Assessment: Normal  Communication   Communication: No difficulties  Cognition Arousal/Alertness: Awake/alert Behavior During Therapy: WFL for tasks assessed/performed Overall Cognitive Status: No family/caregiver present to determine baseline cognitive functioning                                 General Comments: Patient frequently repeated same question throughout session.       General Comments      Exercises General Exercises - Lower Extremity Long Arc Quad: Strengthening;Both;10 reps;Seated Hip ABduction/ADduction: Strengthening;Both;10 reps;Seated Hip Flexion/Marching: Strengthening;Both;10 reps;Seated Other Exercises Other Exercises: STS with UE support x 3   Assessment/Plan    PT Assessment Patient needs continued PT services  PT Problem List Decreased strength;Decreased activity tolerance;Decreased balance;Decreased mobility;Decreased safety awareness       PT Treatment Interventions Gait training;Stair training;Therapeutic exercise;Therapeutic activities;Functional mobility training;Balance training;Neuromuscular re-education;Patient/family  education;DME instruction    PT Goals (Current goals can be found in the Care Plan section)  Acute Rehab PT Goals Patient Stated Goal: Patient desires to return home to PLOF.  PT Goal Formulation: With patient Time For Goal Achievement: 05/16/18 Potential to Achieve Goals: Fair    Frequency Min 2X/week   Barriers to discharge Decreased caregiver support Requires assistance with ADLs and supervision with mobility.     Co-evaluation               AM-PAC PT "6 Clicks" Daily Activity  Outcome Measure Difficulty turning over in bed (including adjusting bedclothes, sheets and blankets)?: None Difficulty moving from lying on back to sitting on the side of the bed? : A Little Difficulty sitting down on and standing up from a chair with arms (e.g., wheelchair, bedside commode, etc,.)?: A Little Help needed moving to and from a bed to chair (including a wheelchair)?: A Little Help needed walking in hospital room?: A Little Help needed climbing 3-5 steps with a railing? : A Lot 6 Click Score: 18    End of Session Equipment Utilized During Treatment: Gait belt;Oxygen(2 L 02 via nasal cannula) Activity Tolerance: Patient tolerated treatment well Patient left: in chair;with call bell/phone within reach;with chair alarm set Nurse Communication: Mobility status PT Visit Diagnosis: Other abnormalities of gait and mobility (R26.89);Muscle weakness (generalized) (M62.81);Unsteadiness on feet (R26.81)    Time: 2979-8921 PT Time Calculation (min) (ACUTE ONLY): 32 min   Charges:   PT Evaluation $PT Eval Low Complexity: 1 Low PT Treatments $Gait Training: 8-22 mins   PT G Codes:        Janna Arch, PT, DPT  Janna Arch 05/02/2018, 9:45 AM

## 2018-05-02 NOTE — Progress Notes (Signed)
RN responded to pt.'s bed alarm, pt was already on the side of the bed. Pt was very tearful and stated that she "does not know why she is here, that she does not understand why someone would let her stay here, and that someone was making an example of her and she did not appreciate that." pt is currently alert to self only. Yesterday, 05/01/2018, pt was alert and oriented to person, place, time, and knew that she was sick. MD was made aware that pt.'s confusion has worsen. New orders place by MD. Will continue to monitor pt.   Keyara Ent [employer]

## 2018-05-02 NOTE — Progress Notes (Signed)
Inpatient Diabetes Program Recommendations  AACE/ADA: New Consensus Statement on Inpatient Glycemic Control (2015)  Target Ranges:  Prepandial:   less than 140 mg/dL      Peak postprandial:   less than 180 mg/dL (1-2 hours)      Critically ill patients:  140 - 180 mg/dL   Results for Tracy Mcmahon, Tracy Mcmahon (MRN [id number]) as of 05/02/2018 21:38  Ref. Range 05/01/2018 23:24 05/02/2018 03:40 05/02/2018 04:44 05/02/2018 07:54 05/02/2018 11:35 05/02/2018 16:42 05/02/2018 20:29  Glucose-Capillary Latest Ref Range: 65 - 99 mg/dL 111 (H) 148 (H)  1 unit NOVOLOG  136 (H) 90 183 (H)  2 units NOVOLOG 196 (H)  2 units NOVOLOG  209 (H)  3 units NOVOLOG    Results for [name], [name] (MRN [id number]) as of 05/03/2018 08:49  Ref. Range 05/03/2018 01:13 05/03/2018 07:52  Glucose-Capillary Latest Ref Range: 65 - 99 mg/dL 110 (H) 146 (H)   Admit UTI/ Hypoglycemia  History: DM, CKD, COPD  Home DM Meds: Lantus 100 QHS (2 injections)                              Novolog 20 units TID  Current Insulin Orders: Novolog Sensitive Correction Scale/ SSI (0-9 units) Q4 hours      Getting Prednisone 7.5 mg daily.  No more episodes of Hypoglycemia since yesterday at 3pm on 06/06.  Renal function relatively unchanged since April admission.  Last saw her PCP on 03/25/2018 (Dr. Raechel Ache with [facility]).  No changes made to pt's insulin regimen at that visit.  Per PCP notes, however, patient was having issues with Hypoglycemia prior to that PCP office visit.  Note that Novolog SSI started yesterday.    MD- Patient requiring very little insulin in hospital so far.  Only had a couple of minor elevations yesterday afternoon that were corrected with small amount of Novolog SSI.  Patient needs to follow up with PCP soon after discharge to discuss insulin regimen.  Likely needs major reduction of home doses of insulin.  Not sure patient should take any Lantus at home before following up with her PCP for further insulin  adjustments.  May want to give patient a copy of the hospital Novolog Sensitive SSI regimen (0-9 units) TID AC to use at home before she follows up with her PCP.     --Will follow patient during hospitalization--  Wyn Quaker RN, MSN, CDE Diabetes Coordinator Inpatient Glycemic Control Team Team Pager: [phone] (8a-5p)

## 2018-05-03 DIAGNOSIS — Z86718 Personal history of other venous thrombosis and embolism: Secondary | ICD-10-CM | POA: Diagnosis not present

## 2018-05-03 DIAGNOSIS — E162 Hypoglycemia, unspecified: Secondary | ICD-10-CM | POA: Diagnosis not present

## 2018-05-03 DIAGNOSIS — N390 Urinary tract infection, site not specified: Secondary | ICD-10-CM | POA: Diagnosis not present

## 2018-05-03 DIAGNOSIS — R4182 Altered mental status, unspecified: Secondary | ICD-10-CM | POA: Diagnosis not present

## 2018-05-03 LAB — BASIC METABOLIC PANEL
Anion gap: 10 (ref 5–15)
BUN: 16 mg/dL (ref 6–20)
CO2: 31 mmol/L (ref 22–32)
CREATININE: 1.15 mg/dL — AB (ref 0.44–1.00)
Calcium: 8.9 mg/dL (ref 8.9–10.3)
Chloride: 103 mmol/L (ref 101–111)
GFR calc Af Amer: 48 mL/min — ABNORMAL LOW (ref >60)
GFR calc non Af Amer: 42 mL/min — ABNORMAL LOW (ref >60)
GLUCOSE: 153 mg/dL — AB (ref 65–99)
POTASSIUM: 4 mmol/L (ref 3.5–5.1)
SODIUM: 144 mmol/L (ref 135–145)

## 2018-05-03 LAB — GLUCOSE, CAPILLARY
GLUCOSE-CAPILLARY: 110 mg/dL — AB (ref 65–99)
GLUCOSE-CAPILLARY: 120 mg/dL — AB (ref 65–99)
Glucose-Capillary: 146 mg/dL — ABNORMAL HIGH (ref 65–99)

## 2018-05-03 MED ORDER — CEPHALEXIN 500 MG PO CAPS: 500.0000 mg | ORAL_CAPSULE | Freq: Three times a day (TID) | ORAL | 0 refills | Status: AC

## 2018-05-03 MED ORDER — APIXABAN 5 MG PO TABS: 5.0000 mg | ORAL_TABLET | Freq: Two times a day (BID) | ORAL | 1 refills | Status: DC

## 2018-05-03 MED ORDER — AMLODIPINE BESYLATE 5 MG PO TABS: 5.0000 mg | ORAL_TABLET | Freq: Every day | ORAL | 2 refills | Status: DC

## 2018-05-03 MED ORDER — INSULIN GLARGINE 100 UNIT/ML ~~LOC~~ SOLN: 8.0000 [IU] | Freq: Every day | SUBCUTANEOUS | 0 refills | Status: DC

## 2018-05-03 NOTE — Progress Notes (Signed)
Pt discharged per MD order. IV removed Discharged instructions discussed with pt and family. All questions answered to pt satisfaction. Pt taken downstairs and helped into car by RN.

## 2018-05-03 NOTE — Discharge Summary (Signed)
[place] [facility] [place] [name]    MR#:  [id number]  DATE OF BIRTH:  11-10-1929  DATE OF ADMISSION:  04/30/2018   ADMITTING PHYSICIAN: Saundra Shelling, MD  DATE OF DISCHARGE:  05/03/18  PRIMARY CARE PHYSICIAN: [name], MD   ADMISSION DIAGNOSIS:   Weakness [R53.1] Hypoglycemia [E16.2] Cystitis [N30.90]  DISCHARGE DIAGNOSIS:   Active Problems:   Hypoglycemia   SECONDARY DIAGNOSIS:   Past Medical History:  Diagnosis Date  . Cancer ([place])   . Cataract   . Chronic kidney disease (CKD), stage III (moderate) (HCC)   . Chronic urticaria   . COPD (chronic obstructive pulmonary disease) ([place])   . Depression   . Diabetes mellitus without complication ([place])   . DVT (deep venous thrombosis) (H[facility])    left leg  . Hyperlipemia   . Hypertension   . Osteoarthritis   . Osteopenia   . Polymyalgia rheumatica ([place])   . Polyneuropathy   . Renal cancer ([place])   . Renal insufficiency 08/10/2016   Chart indicates CKD stage 3  . Restless leg syndrome   . Vitamin D deficiency   . Vulvar intraepithelial neoplasia with lichen sclerosus     HOSPITAL COURSE:   84 year old female with past medical history significant for CKD, COPD, history of DVT on Eliquis, hypertension, diabetes presents from home secondary to altered mental status.  1.  Acute metabolic encephalopathy-secondary to hypoglycemia and UTI on admission. -With improved sugars and on antibiotics, her confusion has resolved. - delirious yesterday morning- improved now  2. Diabetes mellitus with hypoglycemia- on adm, poor oral intake. - held lantus in the hospital.  Restarted a lower dose of 8 units at bedtime at home and PCP follow-up as outpatient  3. UTI-cultures growing E. coli and Klebsiella.   -on Rocephin. change to keflex at discharge  4.  History of DVT in September 2017 -Continue Eliquis for now  5.  Peripheral neuropathy-continue gabapentin, home  dose  6. Chronic COPD- continue home o2, inhalers  Ambulatory, discharge with home health    DISCHARGE CONDITIONS:   Guarded  CONSULTS OBTAINED:   None  DRUG ALLERGIES:   Allergies  Allergen Reactions  . Fosamax [Alendronate Sodium] Other (See Comments)    Reaction:  Made pt "very sick"  . Wellbutrin [Bupropion] Other (See Comments)    Reaction:  Unknown   DISCHARGE MEDICATIONS:   Allergies as of 05/03/2018      Reactions   Fosamax [alendronate Sodium] Other (See Comments)   Reaction:  Made pt "very sick"   Wellbutrin [bupropion] Other (See Comments)   Reaction:  Unknown      Medication List    STOP taking these medications   insulin aspart 100 UNIT/ML injection Commonly known as:  novoLOG     TAKE these medications   albuterol (2.5 MG/3ML) 0.083% nebulizer solution Commonly known as:  PROVENTIL Take 3 mLs (2.5 mg total) by nebulization every 4 (four) hours.   alendronate 35 MG tablet Commonly known as:  FOSAMAX Take 35 mg by mouth every 7 (seven) days. Take with a full glass of water on an empty stomach.   amLODipine 5 MG tablet Commonly known as:  NORVASC Take 1 tablet (5 mg total) by mouth daily.   apixaban 5 MG Tabs tablet Commonly known as:  ELIQUIS Take 1 tablet (5 mg total) by mouth 2 (two) times daily. What changed:    how much to take  additional instructions  aspirin EC 81 MG tablet Take 81 mg by mouth daily.   atenolol 25 MG tablet Commonly known as:  TENORMIN Take 25 mg by mouth 2 (two) times daily.   cephALEXin 500 MG capsule Commonly known as:  KEFLEX Take 1 capsule (500 mg total) by mouth 3 (three) times daily for 5 days.   diphenhydramine-acetaminophen 25-500 MG Tabs tablet Commonly known as:  TYLENOL PM Take 1 tablet by mouth at bedtime as needed (sleep or pain).   FLUoxetine 20 MG capsule Commonly known as:  PROZAC Take 20 mg by mouth daily.   furosemide 20 MG tablet Commonly known as:  LASIX Take 20 mg by mouth  daily.   gabapentin 300 MG capsule Commonly known as:  NEURONTIN Take 1 capsule (300MG ) by mouth every morning, 1 capsule (300MG ) every evening and 3 capsules (900MG ) at bedtime   GNP CAL MAG ZINC +D3 PO Take 1 tablet by mouth daily.   insulin glargine 100 UNIT/ML injection Commonly known as:  LANTUS Inject 0.08 mLs (8 Units total) into the skin at bedtime. What changed:    how much to take  how to take this  when to take this  additional instructions   lidocaine 5 % Commonly known as:  LIDODERM Place 1 patch onto the skin daily. Remove & Discard patch within 12 hours or as directed by MD   pravastatin 20 MG tablet Commonly known as:  PRAVACHOL Take 20 mg by mouth every evening.   predniSONE 5 MG tablet Commonly known as:  DELTASONE Take 7.5 mg by mouth daily.   tiotropium 18 MCG inhalation capsule Commonly known as:  SPIRIVA Place 1 capsule (18 mcg total) into inhaler and inhale daily.   vitamin B-12 1000 MCG tablet Commonly known as:  CYANOCOBALAMIN Take 1,000 mcg by mouth daily.   Vitamin D (Ergocalciferol) 50000 units Caps capsule Commonly known as:  DRISDOL Take 50,000 Units by mouth every Monday.        DISCHARGE INSTRUCTIONS:   1. PCP f/u in 1-2 weeks  DIET:   Cardiac diet  ACTIVITY:   Activity as tolerated  OXYGEN:   Home Oxygen: Yes.    Oxygen Delivery: 2 liters/min via Patient connected to nasal cannula oxygen  DISCHARGE LOCATION:   home   If you experience worsening of your admission symptoms, develop shortness of breath, life threatening emergency, suicidal or homicidal thoughts you must seek medical attention immediately by calling 911 or calling your MD immediately  if symptoms less severe.  You Must read complete instructions/literature along with all the possible adverse reactions/side effects for all the Medicines you take and that have been prescribed to you. Take any new Medicines after you have completely understood and  accpet all the possible adverse reactions/side effects.   Please note  You were cared for by a hospitalist during your hospital stay. If you have any questions about your discharge medications or the care you received while you were in the hospital after you are discharged, you can call the unit and asked to speak with the hospitalist on call if the hospitalist that took care of you is not available. Once you are discharged, your primary care physician will handle any further medical issues. Please note that NO REFILLS for any discharge medications will be authorized once you are discharged, as it is imperative that you return to your primary care physician (or establish a relationship with a primary care physician if you do not have one) for your aftercare needs  so that they can reassess your need for medications and monitor your lab values.    On the day of Discharge:  VITAL SIGNS:   Blood pressure (!) 155/81, pulse 86, temperature 98.5 F (36.9 C), temperature source Oral, resp. rate (!) 22, height 5' (1.524 m), weight 90.7 kg (200 lb), SpO2 95 %.  PHYSICAL EXAMINATION:    GENERAL:  84 y.o.-year-old elderly patient lying in the bed with no acute distress.  EYES: Pupils equal, round, reactive to light and accommodation. No scleral icterus. Extraocular muscles intact.  HEENT: Head atraumatic, normocephalic. Oropharynx and nasopharynx clear.  NECK:  Supple, no jugular venous distention. No thyroid enlargement, no tenderness.  LUNGS: Normal breath sounds bilaterally, no wheezing, rales,rhonchi or crepitation. No use of accessory muscles of respiration.  CARDIOVASCULAR: S1, S2 normal. No rubs, or gallops.  2/6 systolic murmur is present ABDOMEN: Soft, nontender, nondistended. Bowel sounds present. No organomegaly or mass.  EXTREMITIES: No pedal edema, cyanosis, or clubbing.  NEUROLOGIC: Cranial nerves II through XII are intact. Muscle strength 5/5 in all extremities. Sensation intact. Gait not  checked.  PSYCHIATRIC: The patient is alert and oriented to self, delirious and paranoid SKIN: No obvious rash, lesion, or ulcer   DATA REVIEW:   CBC Recent Labs  Lab 05/01/18 0522  WBC 8.2  HGB 9.7*  HCT 30.8*  PLT 279    Chemistries  Recent Labs  Lab 04/30/18 1210  05/03/18 0555  NA 137   < > 144  K 3.7   < > 4.0  CL 95*   < > 103  CO2 32   < > 31  GLUCOSE 176*   < > 153*  BUN 22*   < > 16  CREATININE 1.17*   < > 1.15*  CALCIUM 8.8*   < > 8.9  AST 26  --   --   ALT 14  --   --   ALKPHOS 58  --   --   BILITOT 0.5  --   --    < > = values in this interval not displayed.     Microbiology Results  Results for orders placed or performed during the hospital encounter of 04/30/18  Urine culture     Status: Abnormal   Collection Time: 04/30/18 11:59 AM  Result Value Ref Range Status   Specimen Description   Final    URINE, CLEAN CATCH Performed at [facility], [address] [place], [place] [address]    Special Requests   Final    Normal Performed at [facility], [address] [place], [place] [address]    Culture (A)  Final    >=100,000 COLONIES/mL KLEBSIELLA PNEUMONIAE 60,000 COLONIES/mL ESCHERICHIA COLI    Report Status 05/02/2018 FINAL  Final   Organism ID, Bacteria KLEBSIELLA PNEUMONIAE (A)  Final   Organism ID, Bacteria ESCHERICHIA COLI (A)  Final      Susceptibility   Escherichia coli - MIC*    AMPICILLIN 8 SENSITIVE Sensitive     CEFAZOLIN <=4 SENSITIVE Sensitive     CEFTRIAXONE <=1 SENSITIVE Sensitive     CIPROFLOXACIN <=0.25 SENSITIVE Sensitive     GENTAMICIN <=1 SENSITIVE Sensitive     IMIPENEM <=0.25 SENSITIVE Sensitive     NITROFURANTOIN <=16 SENSITIVE Sensitive     TRIMETH/SULFA <=20 SENSITIVE Sensitive     AMPICILLIN/SULBACTAM 4 SENSITIVE Sensitive     PIP/TAZO <=4 SENSITIVE Sensitive     Extended ESBL NEGATIVE Sensitive     * 60,000  COLONIES/mL ESCHERICHIA COLI   Klebsiella pneumoniae - MIC*    AMPICILLIN  RESISTANT Resistant     CEFAZOLIN <=4 SENSITIVE Sensitive     CEFTRIAXONE <=1 SENSITIVE Sensitive     CIPROFLOXACIN <=0.25 SENSITIVE Sensitive     GENTAMICIN <=1 SENSITIVE Sensitive     IMIPENEM <=0.25 SENSITIVE Sensitive     NITROFURANTOIN 32 SENSITIVE Sensitive     TRIMETH/SULFA <=20 SENSITIVE Sensitive     AMPICILLIN/SULBACTAM <=2 SENSITIVE Sensitive     PIP/TAZO <=4 SENSITIVE Sensitive     Extended ESBL NEGATIVE Sensitive     * >=100,000 COLONIES/mL KLEBSIELLA PNEUMONIAE    RADIOLOGY:  No results found.   Management plans discussed with the patient, family and they are in agreement.  CODE STATUS:     Code Status Orders  (From admission, onward)        Start     Ordered   04/30/18 1447  Full code  Continuous     04/30/18 1446    Code Status History    Date Active Date Inactive Code Status Order ID Comments User Context   03/15/2018 2153 03/17/2018 1803 Full Code [id number]  Lance Coon, MD Inpatient   12/17/2016 0125 12/19/2016 1856 Full Code [id number]  Theodoro Grist, MD Inpatient   08/11/2016 0324 08/11/2016 0937 Full Code [id number]  Lance Coon, MD Inpatient   03/29/2015 2115 03/30/2015 1555 Full Code [id number]  Nicholes Mango, MD Inpatient    Advance Directive Documentation     Most Recent Value  Type of Advance Directive  Healthcare Power of Attorney  Pre-existing out of facility DNR order (yellow form or pink MOST form)  -  "MOST" Form in Place?  -      TOTAL TIME TAKING CARE OF THIS PATIENT: 38 minutes.    Gladstone Lighter M.D on 05/03/2018 at 9:56 AM  Between 7am to 6pm - Pager - [phone]  After 6pm go to [email or web address] - [employer] [facility]  Office  [phone]  CC: Primary care physician; [name], MD   Note: This dictation was prepared with Dragon dictation along with smaller phrase technology. Any transcriptional errors that result from this process are unintentional.

## 2018-05-03 NOTE — Progress Notes (Signed)
Patient has been A&O with episodes of forgetfulness.  No complaints of pain through the night.  Resp even and unlabored on 2L O2, which is chronic.  BS has remained stable.  Fingerstick q 4 hours.

## 2018-05-03 NOTE — Care Management Note (Signed)
Case Management Note  Patient Details  [name] MRN: [id number] Date of Birth: 08/02/1929   Patient to discharge home today.  Patient and family has decided that patient will discharge back to her home.  Address on facesheet confirmed correct.  Jermaine with [facility] notified of discharge.  Daughter Barnetta Chapel would like to be called to arranged home health.  She states that she may have to be the one to come over and let home health in the home.   Her contact information  Is [phone] OR [phone].  This information has been provided to  [facility] with [facility].  RNCM signing off  Subjective/Objective:                    Action/Plan:   Expected Discharge Date:  05/03/18               Expected Discharge Plan:  [place]  In-House Referral:     Discharge planning Services  CM Consult  Post Acute Care Choice:  Home Health Choice offered to:  Patient, Adult Children  DME Arranged:    DME Agency:     HH Arranged:  RN, PT, Social Work, Nurse's Aide [place] Agency:  [place]  Status of Service:  Completed, signed off  If discussed at [employer], dates discussed:    Additional Comments:  Beverly Sessions, RN 05/03/2018, 10:32 AM

## 2018-05-06 DIAGNOSIS — E1122 Type 2 diabetes mellitus with diabetic chronic kidney disease: Secondary | ICD-10-CM | POA: Diagnosis not present

## 2018-05-06 DIAGNOSIS — I129 Hypertensive chronic kidney disease with stage 1 through stage 4 chronic kidney disease, or unspecified chronic kidney disease: Secondary | ICD-10-CM | POA: Diagnosis not present

## 2018-05-06 DIAGNOSIS — Z85528 Personal history of other malignant neoplasm of kidney: Secondary | ICD-10-CM | POA: Diagnosis not present

## 2018-05-06 DIAGNOSIS — N3090 Cystitis, unspecified without hematuria: Secondary | ICD-10-CM | POA: Diagnosis not present

## 2018-05-06 DIAGNOSIS — E11649 Type 2 diabetes mellitus with hypoglycemia without coma: Secondary | ICD-10-CM | POA: Diagnosis not present

## 2018-05-06 DIAGNOSIS — J449 Chronic obstructive pulmonary disease, unspecified: Secondary | ICD-10-CM | POA: Diagnosis not present

## 2018-05-06 DIAGNOSIS — I82502 Chronic embolism and thrombosis of unspecified deep veins of left lower extremity: Secondary | ICD-10-CM | POA: Diagnosis not present

## 2018-05-06 DIAGNOSIS — N183 Chronic kidney disease, stage 3 (moderate): Secondary | ICD-10-CM | POA: Diagnosis not present

## 2018-05-06 DIAGNOSIS — B9620 Unspecified Escherichia coli [E. coli] as the cause of diseases classified elsewhere: Secondary | ICD-10-CM | POA: Diagnosis not present

## 2018-05-07 DIAGNOSIS — B9620 Unspecified Escherichia coli [E. coli] as the cause of diseases classified elsewhere: Secondary | ICD-10-CM | POA: Diagnosis not present

## 2018-05-07 DIAGNOSIS — E1122 Type 2 diabetes mellitus with diabetic chronic kidney disease: Secondary | ICD-10-CM | POA: Diagnosis not present

## 2018-05-07 DIAGNOSIS — Z85528 Personal history of other malignant neoplasm of kidney: Secondary | ICD-10-CM | POA: Diagnosis not present

## 2018-05-07 DIAGNOSIS — I82502 Chronic embolism and thrombosis of unspecified deep veins of left lower extremity: Secondary | ICD-10-CM | POA: Diagnosis not present

## 2018-05-07 DIAGNOSIS — J449 Chronic obstructive pulmonary disease, unspecified: Secondary | ICD-10-CM | POA: Diagnosis not present

## 2018-05-07 DIAGNOSIS — E11649 Type 2 diabetes mellitus with hypoglycemia without coma: Secondary | ICD-10-CM | POA: Diagnosis not present

## 2018-05-07 DIAGNOSIS — N3090 Cystitis, unspecified without hematuria: Secondary | ICD-10-CM | POA: Diagnosis not present

## 2018-05-07 DIAGNOSIS — I129 Hypertensive chronic kidney disease with stage 1 through stage 4 chronic kidney disease, or unspecified chronic kidney disease: Secondary | ICD-10-CM | POA: Diagnosis not present

## 2018-05-07 DIAGNOSIS — N183 Chronic kidney disease, stage 3 (moderate): Secondary | ICD-10-CM | POA: Diagnosis not present

## 2018-05-12 DIAGNOSIS — I129 Hypertensive chronic kidney disease with stage 1 through stage 4 chronic kidney disease, or unspecified chronic kidney disease: Secondary | ICD-10-CM | POA: Diagnosis not present

## 2018-05-12 DIAGNOSIS — E1122 Type 2 diabetes mellitus with diabetic chronic kidney disease: Secondary | ICD-10-CM | POA: Diagnosis not present

## 2018-05-12 DIAGNOSIS — Z85528 Personal history of other malignant neoplasm of kidney: Secondary | ICD-10-CM | POA: Diagnosis not present

## 2018-05-12 DIAGNOSIS — N183 Chronic kidney disease, stage 3 (moderate): Secondary | ICD-10-CM | POA: Diagnosis not present

## 2018-05-12 DIAGNOSIS — E11649 Type 2 diabetes mellitus with hypoglycemia without coma: Secondary | ICD-10-CM | POA: Diagnosis not present

## 2018-05-12 DIAGNOSIS — N3090 Cystitis, unspecified without hematuria: Secondary | ICD-10-CM | POA: Diagnosis not present

## 2018-05-12 DIAGNOSIS — J449 Chronic obstructive pulmonary disease, unspecified: Secondary | ICD-10-CM | POA: Diagnosis not present

## 2018-05-12 DIAGNOSIS — B9620 Unspecified Escherichia coli [E. coli] as the cause of diseases classified elsewhere: Secondary | ICD-10-CM | POA: Diagnosis not present

## 2018-05-12 DIAGNOSIS — I82502 Chronic embolism and thrombosis of unspecified deep veins of left lower extremity: Secondary | ICD-10-CM | POA: Diagnosis not present

## 2018-05-14 DIAGNOSIS — E160 Drug-induced hypoglycemia without coma: Secondary | ICD-10-CM | POA: Diagnosis not present

## 2018-05-14 DIAGNOSIS — N390 Urinary tract infection, site not specified: Secondary | ICD-10-CM | POA: Diagnosis not present

## 2018-05-14 DIAGNOSIS — I10 Essential (primary) hypertension: Secondary | ICD-10-CM | POA: Diagnosis not present

## 2018-05-14 DIAGNOSIS — E1122 Type 2 diabetes mellitus with diabetic chronic kidney disease: Secondary | ICD-10-CM | POA: Diagnosis not present

## 2018-05-14 DIAGNOSIS — J9611 Chronic respiratory failure with hypoxia: Secondary | ICD-10-CM | POA: Diagnosis not present

## 2018-05-14 DIAGNOSIS — N183 Chronic kidney disease, stage 3 (moderate): Secondary | ICD-10-CM | POA: Diagnosis not present

## 2018-05-14 DIAGNOSIS — G2581 Restless legs syndrome: Secondary | ICD-10-CM | POA: Diagnosis not present

## 2018-05-14 DIAGNOSIS — J439 Emphysema, unspecified: Secondary | ICD-10-CM | POA: Diagnosis not present

## 2018-05-14 DIAGNOSIS — A499 Bacterial infection, unspecified: Secondary | ICD-10-CM | POA: Diagnosis not present

## 2018-05-16 DIAGNOSIS — N183 Chronic kidney disease, stage 3 (moderate): Secondary | ICD-10-CM | POA: Diagnosis not present

## 2018-05-16 DIAGNOSIS — B9620 Unspecified Escherichia coli [E. coli] as the cause of diseases classified elsewhere: Secondary | ICD-10-CM | POA: Diagnosis not present

## 2018-05-16 DIAGNOSIS — E1122 Type 2 diabetes mellitus with diabetic chronic kidney disease: Secondary | ICD-10-CM | POA: Diagnosis not present

## 2018-05-16 DIAGNOSIS — I129 Hypertensive chronic kidney disease with stage 1 through stage 4 chronic kidney disease, or unspecified chronic kidney disease: Secondary | ICD-10-CM | POA: Diagnosis not present

## 2018-05-16 DIAGNOSIS — Z85528 Personal history of other malignant neoplasm of kidney: Secondary | ICD-10-CM | POA: Diagnosis not present

## 2018-05-16 DIAGNOSIS — J449 Chronic obstructive pulmonary disease, unspecified: Secondary | ICD-10-CM | POA: Diagnosis not present

## 2018-05-16 DIAGNOSIS — N3090 Cystitis, unspecified without hematuria: Secondary | ICD-10-CM | POA: Diagnosis not present

## 2018-05-16 DIAGNOSIS — E11649 Type 2 diabetes mellitus with hypoglycemia without coma: Secondary | ICD-10-CM | POA: Diagnosis not present

## 2018-05-16 DIAGNOSIS — I82502 Chronic embolism and thrombosis of unspecified deep veins of left lower extremity: Secondary | ICD-10-CM | POA: Diagnosis not present

## 2018-05-19 DIAGNOSIS — Z85528 Personal history of other malignant neoplasm of kidney: Secondary | ICD-10-CM | POA: Diagnosis not present

## 2018-05-19 DIAGNOSIS — E1122 Type 2 diabetes mellitus with diabetic chronic kidney disease: Secondary | ICD-10-CM | POA: Diagnosis not present

## 2018-05-19 DIAGNOSIS — N3090 Cystitis, unspecified without hematuria: Secondary | ICD-10-CM | POA: Diagnosis not present

## 2018-05-19 DIAGNOSIS — I129 Hypertensive chronic kidney disease with stage 1 through stage 4 chronic kidney disease, or unspecified chronic kidney disease: Secondary | ICD-10-CM | POA: Diagnosis not present

## 2018-05-19 DIAGNOSIS — B9620 Unspecified Escherichia coli [E. coli] as the cause of diseases classified elsewhere: Secondary | ICD-10-CM | POA: Diagnosis not present

## 2018-05-19 DIAGNOSIS — J449 Chronic obstructive pulmonary disease, unspecified: Secondary | ICD-10-CM | POA: Diagnosis not present

## 2018-05-19 DIAGNOSIS — E11649 Type 2 diabetes mellitus with hypoglycemia without coma: Secondary | ICD-10-CM | POA: Diagnosis not present

## 2018-05-19 DIAGNOSIS — I82502 Chronic embolism and thrombosis of unspecified deep veins of left lower extremity: Secondary | ICD-10-CM | POA: Diagnosis not present

## 2018-05-19 DIAGNOSIS — N183 Chronic kidney disease, stage 3 (moderate): Secondary | ICD-10-CM | POA: Diagnosis not present

## 2018-05-22 DIAGNOSIS — E1122 Type 2 diabetes mellitus with diabetic chronic kidney disease: Secondary | ICD-10-CM | POA: Diagnosis not present

## 2018-05-22 DIAGNOSIS — E11649 Type 2 diabetes mellitus with hypoglycemia without coma: Secondary | ICD-10-CM | POA: Diagnosis not present

## 2018-05-22 DIAGNOSIS — B9620 Unspecified Escherichia coli [E. coli] as the cause of diseases classified elsewhere: Secondary | ICD-10-CM | POA: Diagnosis not present

## 2018-05-22 DIAGNOSIS — N3090 Cystitis, unspecified without hematuria: Secondary | ICD-10-CM | POA: Diagnosis not present

## 2018-05-22 DIAGNOSIS — N183 Chronic kidney disease, stage 3 (moderate): Secondary | ICD-10-CM | POA: Diagnosis not present

## 2018-05-22 DIAGNOSIS — I129 Hypertensive chronic kidney disease with stage 1 through stage 4 chronic kidney disease, or unspecified chronic kidney disease: Secondary | ICD-10-CM | POA: Diagnosis not present

## 2018-05-22 DIAGNOSIS — J449 Chronic obstructive pulmonary disease, unspecified: Secondary | ICD-10-CM | POA: Diagnosis not present

## 2018-05-22 DIAGNOSIS — Z85528 Personal history of other malignant neoplasm of kidney: Secondary | ICD-10-CM | POA: Diagnosis not present

## 2018-05-22 DIAGNOSIS — I82502 Chronic embolism and thrombosis of unspecified deep veins of left lower extremity: Secondary | ICD-10-CM | POA: Diagnosis not present

## 2018-05-26 DIAGNOSIS — E1122 Type 2 diabetes mellitus with diabetic chronic kidney disease: Secondary | ICD-10-CM | POA: Diagnosis not present

## 2018-05-26 DIAGNOSIS — N183 Chronic kidney disease, stage 3 (moderate): Secondary | ICD-10-CM | POA: Diagnosis not present

## 2018-05-26 DIAGNOSIS — I82502 Chronic embolism and thrombosis of unspecified deep veins of left lower extremity: Secondary | ICD-10-CM | POA: Diagnosis not present

## 2018-05-26 DIAGNOSIS — J449 Chronic obstructive pulmonary disease, unspecified: Secondary | ICD-10-CM | POA: Diagnosis not present

## 2018-05-26 DIAGNOSIS — N3090 Cystitis, unspecified without hematuria: Secondary | ICD-10-CM | POA: Diagnosis not present

## 2018-05-26 DIAGNOSIS — I129 Hypertensive chronic kidney disease with stage 1 through stage 4 chronic kidney disease, or unspecified chronic kidney disease: Secondary | ICD-10-CM | POA: Diagnosis not present

## 2018-05-26 DIAGNOSIS — E11649 Type 2 diabetes mellitus with hypoglycemia without coma: Secondary | ICD-10-CM | POA: Diagnosis not present

## 2018-05-26 DIAGNOSIS — B9620 Unspecified Escherichia coli [E. coli] as the cause of diseases classified elsewhere: Secondary | ICD-10-CM | POA: Diagnosis not present

## 2018-05-26 DIAGNOSIS — Z85528 Personal history of other malignant neoplasm of kidney: Secondary | ICD-10-CM | POA: Diagnosis not present

## 2018-06-03 DIAGNOSIS — N183 Chronic kidney disease, stage 3 (moderate): Secondary | ICD-10-CM | POA: Diagnosis not present

## 2018-06-03 DIAGNOSIS — E11649 Type 2 diabetes mellitus with hypoglycemia without coma: Secondary | ICD-10-CM | POA: Diagnosis not present

## 2018-06-03 DIAGNOSIS — I129 Hypertensive chronic kidney disease with stage 1 through stage 4 chronic kidney disease, or unspecified chronic kidney disease: Secondary | ICD-10-CM | POA: Diagnosis not present

## 2018-06-03 DIAGNOSIS — N3090 Cystitis, unspecified without hematuria: Secondary | ICD-10-CM | POA: Diagnosis not present

## 2018-06-03 DIAGNOSIS — B9620 Unspecified Escherichia coli [E. coli] as the cause of diseases classified elsewhere: Secondary | ICD-10-CM | POA: Diagnosis not present

## 2018-06-03 DIAGNOSIS — Z85528 Personal history of other malignant neoplasm of kidney: Secondary | ICD-10-CM | POA: Diagnosis not present

## 2018-06-03 DIAGNOSIS — I82502 Chronic embolism and thrombosis of unspecified deep veins of left lower extremity: Secondary | ICD-10-CM | POA: Diagnosis not present

## 2018-06-03 DIAGNOSIS — E1122 Type 2 diabetes mellitus with diabetic chronic kidney disease: Secondary | ICD-10-CM | POA: Diagnosis not present

## 2018-06-03 DIAGNOSIS — J449 Chronic obstructive pulmonary disease, unspecified: Secondary | ICD-10-CM | POA: Diagnosis not present

## 2018-06-04 DIAGNOSIS — E1122 Type 2 diabetes mellitus with diabetic chronic kidney disease: Secondary | ICD-10-CM | POA: Diagnosis not present

## 2018-06-04 DIAGNOSIS — Z85528 Personal history of other malignant neoplasm of kidney: Secondary | ICD-10-CM | POA: Diagnosis not present

## 2018-06-04 DIAGNOSIS — I82502 Chronic embolism and thrombosis of unspecified deep veins of left lower extremity: Secondary | ICD-10-CM | POA: Diagnosis not present

## 2018-06-04 DIAGNOSIS — J449 Chronic obstructive pulmonary disease, unspecified: Secondary | ICD-10-CM | POA: Diagnosis not present

## 2018-06-04 DIAGNOSIS — N183 Chronic kidney disease, stage 3 (moderate): Secondary | ICD-10-CM | POA: Diagnosis not present

## 2018-06-04 DIAGNOSIS — I129 Hypertensive chronic kidney disease with stage 1 through stage 4 chronic kidney disease, or unspecified chronic kidney disease: Secondary | ICD-10-CM | POA: Diagnosis not present

## 2018-06-04 DIAGNOSIS — B9620 Unspecified Escherichia coli [E. coli] as the cause of diseases classified elsewhere: Secondary | ICD-10-CM | POA: Diagnosis not present

## 2018-06-04 DIAGNOSIS — N3090 Cystitis, unspecified without hematuria: Secondary | ICD-10-CM | POA: Diagnosis not present

## 2018-06-04 DIAGNOSIS — E11649 Type 2 diabetes mellitus with hypoglycemia without coma: Secondary | ICD-10-CM | POA: Diagnosis not present

## 2018-06-11 DIAGNOSIS — I129 Hypertensive chronic kidney disease with stage 1 through stage 4 chronic kidney disease, or unspecified chronic kidney disease: Secondary | ICD-10-CM | POA: Diagnosis not present

## 2018-06-11 DIAGNOSIS — E11649 Type 2 diabetes mellitus with hypoglycemia without coma: Secondary | ICD-10-CM | POA: Diagnosis not present

## 2018-06-11 DIAGNOSIS — Z85528 Personal history of other malignant neoplasm of kidney: Secondary | ICD-10-CM | POA: Diagnosis not present

## 2018-06-11 DIAGNOSIS — E1122 Type 2 diabetes mellitus with diabetic chronic kidney disease: Secondary | ICD-10-CM | POA: Diagnosis not present

## 2018-06-11 DIAGNOSIS — J449 Chronic obstructive pulmonary disease, unspecified: Secondary | ICD-10-CM | POA: Diagnosis not present

## 2018-06-11 DIAGNOSIS — I82502 Chronic embolism and thrombosis of unspecified deep veins of left lower extremity: Secondary | ICD-10-CM | POA: Diagnosis not present

## 2018-06-11 DIAGNOSIS — N3090 Cystitis, unspecified without hematuria: Secondary | ICD-10-CM | POA: Diagnosis not present

## 2018-06-11 DIAGNOSIS — B9620 Unspecified Escherichia coli [E. coli] as the cause of diseases classified elsewhere: Secondary | ICD-10-CM | POA: Diagnosis not present

## 2018-06-11 DIAGNOSIS — N183 Chronic kidney disease, stage 3 (moderate): Secondary | ICD-10-CM | POA: Diagnosis not present

## 2018-06-18 DIAGNOSIS — E11649 Type 2 diabetes mellitus with hypoglycemia without coma: Secondary | ICD-10-CM | POA: Diagnosis not present

## 2018-06-18 DIAGNOSIS — N3090 Cystitis, unspecified without hematuria: Secondary | ICD-10-CM | POA: Diagnosis not present

## 2018-06-18 DIAGNOSIS — I129 Hypertensive chronic kidney disease with stage 1 through stage 4 chronic kidney disease, or unspecified chronic kidney disease: Secondary | ICD-10-CM | POA: Diagnosis not present

## 2018-06-18 DIAGNOSIS — Z85528 Personal history of other malignant neoplasm of kidney: Secondary | ICD-10-CM | POA: Diagnosis not present

## 2018-06-18 DIAGNOSIS — B9620 Unspecified Escherichia coli [E. coli] as the cause of diseases classified elsewhere: Secondary | ICD-10-CM | POA: Diagnosis not present

## 2018-06-18 DIAGNOSIS — N183 Chronic kidney disease, stage 3 (moderate): Secondary | ICD-10-CM | POA: Diagnosis not present

## 2018-06-18 DIAGNOSIS — E1122 Type 2 diabetes mellitus with diabetic chronic kidney disease: Secondary | ICD-10-CM | POA: Diagnosis not present

## 2018-06-18 DIAGNOSIS — J449 Chronic obstructive pulmonary disease, unspecified: Secondary | ICD-10-CM | POA: Diagnosis not present

## 2018-06-18 DIAGNOSIS — I82502 Chronic embolism and thrombosis of unspecified deep veins of left lower extremity: Secondary | ICD-10-CM | POA: Diagnosis not present

## 2018-06-26 ENCOUNTER — Other Ambulatory Visit: Payer: Self-pay

## 2018-06-26 ENCOUNTER — Emergency Department: Payer: Medicare HMO

## 2018-06-26 ENCOUNTER — Inpatient Hospital Stay
Admission: EM | Admit: 2018-06-26 | Discharge: 2018-06-29 | DRG: 690 | Disposition: A | Discharge status: Skilled Nursing Facility | Payer: Medicare HMO | Attending: Internal Medicine | Admitting: Internal Medicine

## 2018-06-26 DIAGNOSIS — R279 Unspecified lack of coordination: Secondary | ICD-10-CM | POA: Diagnosis not present

## 2018-06-26 DIAGNOSIS — J9622 Acute and chronic respiratory failure with hypercapnia: Secondary | ICD-10-CM | POA: Diagnosis not present

## 2018-06-26 DIAGNOSIS — Z794 Long term (current) use of insulin: Secondary | ICD-10-CM | POA: Diagnosis not present

## 2018-06-26 DIAGNOSIS — R410 Disorientation, unspecified: Secondary | ICD-10-CM

## 2018-06-26 DIAGNOSIS — Z86718 Personal history of other venous thrombosis and embolism: Secondary | ICD-10-CM | POA: Diagnosis not present

## 2018-06-26 DIAGNOSIS — Z7901 Long term (current) use of anticoagulants: Secondary | ICD-10-CM

## 2018-06-26 DIAGNOSIS — M6281 Muscle weakness (generalized): Secondary | ICD-10-CM | POA: Diagnosis not present

## 2018-06-26 DIAGNOSIS — E119 Type 2 diabetes mellitus without complications: Secondary | ICD-10-CM | POA: Diagnosis not present

## 2018-06-26 DIAGNOSIS — Z96643 Presence of artificial hip joint, bilateral: Secondary | ICD-10-CM | POA: Diagnosis present

## 2018-06-26 DIAGNOSIS — G9340 Encephalopathy, unspecified: Secondary | ICD-10-CM

## 2018-06-26 DIAGNOSIS — N390 Urinary tract infection, site not specified: Secondary | ICD-10-CM

## 2018-06-26 DIAGNOSIS — B9620 Unspecified Escherichia coli [E. coli] as the cause of diseases classified elsewhere: Secondary | ICD-10-CM | POA: Diagnosis present

## 2018-06-26 DIAGNOSIS — R6889 Other general symptoms and signs: Secondary | ICD-10-CM | POA: Diagnosis not present

## 2018-06-26 DIAGNOSIS — F329 Major depressive disorder, single episode, unspecified: Secondary | ICD-10-CM | POA: Diagnosis present

## 2018-06-26 DIAGNOSIS — Z7982 Long term (current) use of aspirin: Secondary | ICD-10-CM

## 2018-06-26 DIAGNOSIS — Z79899 Other long term (current) drug therapy: Secondary | ICD-10-CM | POA: Diagnosis not present

## 2018-06-26 DIAGNOSIS — Z7951 Long term (current) use of inhaled steroids: Secondary | ICD-10-CM

## 2018-06-26 DIAGNOSIS — N3000 Acute cystitis without hematuria: Principal | ICD-10-CM | POA: Diagnosis present

## 2018-06-26 DIAGNOSIS — N183 Chronic kidney disease, stage 3 (moderate): Secondary | ICD-10-CM | POA: Diagnosis not present

## 2018-06-26 DIAGNOSIS — Z85528 Personal history of other malignant neoplasm of kidney: Secondary | ICD-10-CM

## 2018-06-26 DIAGNOSIS — E785 Hyperlipidemia, unspecified: Secondary | ICD-10-CM | POA: Diagnosis not present

## 2018-06-26 DIAGNOSIS — Z905 Acquired absence of kidney: Secondary | ICD-10-CM | POA: Diagnosis not present

## 2018-06-26 DIAGNOSIS — I10 Essential (primary) hypertension: Secondary | ICD-10-CM | POA: Diagnosis not present

## 2018-06-26 DIAGNOSIS — I129 Hypertensive chronic kidney disease with stage 1 through stage 4 chronic kidney disease, or unspecified chronic kidney disease: Secondary | ICD-10-CM | POA: Diagnosis present

## 2018-06-26 DIAGNOSIS — I82503 Chronic embolism and thrombosis of unspecified deep veins of lower extremity, bilateral: Secondary | ICD-10-CM | POA: Diagnosis not present

## 2018-06-26 DIAGNOSIS — J189 Pneumonia, unspecified organism: Secondary | ICD-10-CM | POA: Diagnosis not present

## 2018-06-26 DIAGNOSIS — G2581 Restless legs syndrome: Secondary | ICD-10-CM | POA: Diagnosis present

## 2018-06-26 DIAGNOSIS — E1140 Type 2 diabetes mellitus with diabetic neuropathy, unspecified: Secondary | ICD-10-CM | POA: Diagnosis present

## 2018-06-26 DIAGNOSIS — R531 Weakness: Secondary | ICD-10-CM

## 2018-06-26 DIAGNOSIS — M353 Polymyalgia rheumatica: Secondary | ICD-10-CM | POA: Diagnosis present

## 2018-06-26 DIAGNOSIS — E1122 Type 2 diabetes mellitus with diabetic chronic kidney disease: Secondary | ICD-10-CM | POA: Diagnosis not present

## 2018-06-26 DIAGNOSIS — J449 Chronic obstructive pulmonary disease, unspecified: Secondary | ICD-10-CM | POA: Diagnosis not present

## 2018-06-26 DIAGNOSIS — W19XXXA Unspecified fall, initial encounter: Secondary | ICD-10-CM | POA: Diagnosis not present

## 2018-06-26 DIAGNOSIS — Z888 Allergy status to other drugs, medicaments and biological substances status: Secondary | ICD-10-CM

## 2018-06-26 DIAGNOSIS — R41841 Cognitive communication deficit: Secondary | ICD-10-CM | POA: Diagnosis not present

## 2018-06-26 LAB — COMPREHENSIVE METABOLIC PANEL
ALBUMIN: 3.6 g/dL (ref 3.5–5.0)
ALT: 12 U/L (ref 0–44)
AST: 24 U/L (ref 15–41)
Alkaline Phosphatase: 75 U/L (ref 38–126)
Anion gap: 13 (ref 5–15)
BUN: 16 mg/dL (ref 8–23)
CHLORIDE: 101 mmol/L (ref 98–111)
CO2: 27 mmol/L (ref 22–32)
CREATININE: 1.8 mg/dL — AB (ref 0.44–1.00)
Calcium: 9 mg/dL (ref 8.9–10.3)
GFR calc non Af Amer: 24 mL/min — ABNORMAL LOW (ref >60)
GFR, EST AFRICAN AMERICAN: 28 mL/min — AB (ref >60)
GLUCOSE: 181 mg/dL — AB (ref 70–99)
Potassium: 3.2 mmol/L — ABNORMAL LOW (ref 3.5–5.1)
SODIUM: 141 mmol/L (ref 135–145)
TOTAL PROTEIN: 6.9 g/dL (ref 6.5–8.1)
Total Bilirubin: 0.8 mg/dL (ref 0.3–1.2)

## 2018-06-26 LAB — URINALYSIS, COMPLETE (UACMP) WITH MICROSCOPIC
Bilirubin Urine: NEGATIVE
Glucose, UA: NEGATIVE mg/dL
Ketones, ur: 5 mg/dL — AB
Nitrite: POSITIVE — AB
PROTEIN: 100 mg/dL — AB
Specific Gravity, Urine: 1.017 (ref 1.005–1.030)
WBC, UA: >50 WBC/hpf — ABNORMAL HIGH (ref 0–5)
pH: 5 (ref 5.0–8.0)

## 2018-06-26 LAB — CBC
HEMATOCRIT: 32.8 % — AB (ref 35.0–47.0)
Hemoglobin: 10.8 g/dL — ABNORMAL LOW (ref 12.0–16.0)
MCH: 27.8 pg (ref 26.0–34.0)
MCHC: 32.9 g/dL (ref 32.0–36.0)
MCV: 84.7 fL (ref 80.0–100.0)
PLATELETS: 325 10*3/uL (ref 150–440)
RBC: 3.87 MIL/uL (ref 3.80–5.20)
RDW: 17.3 % — ABNORMAL HIGH (ref 11.5–14.5)
WBC: 9.5 10*3/uL (ref 3.6–11.0)

## 2018-06-26 LAB — TROPONIN I: TROPONIN I: 0.03 ng/mL — AB (ref <0.03)

## 2018-06-26 LAB — GLUCOSE, CAPILLARY
GLUCOSE-CAPILLARY: 239 mg/dL — AB (ref 70–99)
Glucose-Capillary: 176 mg/dL — ABNORMAL HIGH (ref 70–99)

## 2018-06-26 MED ORDER — POTASSIUM CHLORIDE CRYS ER 20 MEQ PO TBCR
40.0000 meq | EXTENDED_RELEASE_TABLET | Freq: Once | ORAL | Status: AC
  Administered 2018-06-26: 14:00:00 40 meq via ORAL
  Filled 2018-06-26: qty 2

## 2018-06-26 MED ORDER — DOCUSATE SODIUM 100 MG PO CAPS
100.0000 mg | ORAL_CAPSULE | Freq: Two times a day (BID) | ORAL | Status: DC
  Administered 2018-06-26 – 2018-06-29 (×7): 100 mg via ORAL
  Filled 2018-06-26 (×7): qty 1

## 2018-06-26 MED ORDER — INSULIN ASPART 100 UNIT/ML ~~LOC~~ SOLN
0.0000 [IU] | Freq: Three times a day (TID) | SUBCUTANEOUS | Status: DC
  Administered 2018-06-26: 2 [IU] via SUBCUTANEOUS
  Administered 2018-06-27: 1 [IU] via SUBCUTANEOUS
  Administered 2018-06-27 (×2): 2 [IU] via SUBCUTANEOUS
  Administered 2018-06-28 – 2018-06-29 (×3): 3 [IU] via SUBCUTANEOUS
  Administered 2018-06-29: 2 [IU] via SUBCUTANEOUS
  Filled 2018-06-26 (×9): qty 1

## 2018-06-26 MED ORDER — PREDNISONE 5 MG PO TABS
7.5000 mg | ORAL_TABLET | Freq: Every day | ORAL | Status: DC
  Administered 2018-06-26 – 2018-06-29 (×4): 7.5 mg via ORAL
  Filled 2018-06-26 (×4): qty 1.5

## 2018-06-26 MED ORDER — SODIUM CHLORIDE 0.9 % IV BOLUS
500.0000 mL | Freq: Once | INTRAVENOUS | Status: AC
  Administered 2018-06-26: 500 mL via INTRAVENOUS

## 2018-06-26 MED ORDER — SODIUM CHLORIDE 0.9 % IV SOLN
1.0000 g | INTRAVENOUS | Status: DC
  Administered 2018-06-27 – 2018-06-29 (×3): 1 g via INTRAVENOUS
  Filled 2018-06-26: qty 1
  Filled 2018-06-26 (×2): qty 10
  Filled 2018-06-26: qty 1

## 2018-06-26 MED ORDER — FLUOXETINE HCL 20 MG PO CAPS
20.0000 mg | ORAL_CAPSULE | Freq: Every day | ORAL | Status: DC
  Administered 2018-06-26 – 2018-06-29 (×4): 20 mg via ORAL
  Filled 2018-06-26 (×4): qty 1

## 2018-06-26 MED ORDER — GABAPENTIN 600 MG PO TABS
900.0000 mg | ORAL_TABLET | Freq: Every day | ORAL | Status: DC
Start: 2018-06-26 — End: 2018-06-29
  Administered 2018-06-26 – 2018-06-29 (×4): 900 mg via ORAL
  Filled 2018-06-26 (×4): qty 2

## 2018-06-26 MED ORDER — INSULIN ASPART 100 UNIT/ML ~~LOC~~ SOLN
0.0000 [IU] | Freq: Every day | SUBCUTANEOUS | Status: DC
  Administered 2018-06-26: 22:00:00 2 [IU] via SUBCUTANEOUS
  Filled 2018-06-26: qty 1

## 2018-06-26 MED ORDER — ASPIRIN EC 81 MG PO TBEC
81.0000 mg | DELAYED_RELEASE_TABLET | Freq: Every day | ORAL | Status: DC
  Administered 2018-06-26 – 2018-06-29 (×4): 81 mg via ORAL
  Filled 2018-06-26 (×4): qty 1

## 2018-06-26 MED ORDER — ALBUTEROL SULFATE (2.5 MG/3ML) 0.083% IN NEBU: 2.5000 mg | INHALATION_SOLUTION | RESPIRATORY_TRACT | Status: DC | PRN

## 2018-06-26 MED ORDER — AMLODIPINE BESYLATE 5 MG PO TABS
5.0000 mg | ORAL_TABLET | Freq: Every day | ORAL | Status: DC
  Administered 2018-06-26 – 2018-06-28 (×3): 5 mg via ORAL
  Filled 2018-06-26 (×3): qty 1

## 2018-06-26 MED ORDER — ATENOLOL 25 MG PO TABS
25.0000 mg | ORAL_TABLET | Freq: Two times a day (BID) | ORAL | Status: DC
  Administered 2018-06-26 – 2018-06-29 (×7): 25 mg via ORAL
  Filled 2018-06-26 (×8): qty 1

## 2018-06-26 MED ORDER — PRAVASTATIN SODIUM 20 MG PO TABS
20.0000 mg | ORAL_TABLET | Freq: Every evening | ORAL | Status: DC
  Administered 2018-06-26 – 2018-06-29 (×4): 20 mg via ORAL
  Filled 2018-06-26 (×4): qty 1

## 2018-06-26 MED ORDER — FUROSEMIDE 20 MG PO TABS
20.0000 mg | ORAL_TABLET | Freq: Every day | ORAL | Status: DC
  Administered 2018-06-26 – 2018-06-28 (×3): 20 mg via ORAL
  Filled 2018-06-26 (×3): qty 1

## 2018-06-26 MED ORDER — DIPHENHYDRAMINE-APAP (SLEEP) 25-500 MG PO TABS: 1.0000 | ORAL_TABLET | Freq: Every evening | ORAL | Status: DC | PRN

## 2018-06-26 MED ORDER — GABAPENTIN 300 MG PO CAPS
300.0000 mg | ORAL_CAPSULE | Freq: Two times a day (BID) | ORAL | Status: DC
  Administered 2018-06-26 – 2018-06-29 (×6): 300 mg via ORAL
  Filled 2018-06-26 (×6): qty 1

## 2018-06-26 MED ORDER — ALBUTEROL SULFATE (2.5 MG/3ML) 0.083% IN NEBU
2.5000 mg | INHALATION_SOLUTION | RESPIRATORY_TRACT | Status: DC
  Administered 2018-06-26 (×3): 2.5 mg via RESPIRATORY_TRACT
  Filled 2018-06-26 (×3): qty 3

## 2018-06-26 MED ORDER — VITAMIN B-12 1000 MCG PO TABS
1000.0000 ug | ORAL_TABLET | Freq: Every day | ORAL | Status: DC
  Administered 2018-06-26 – 2018-06-29 (×4): 1000 ug via ORAL
  Filled 2018-06-26 (×4): qty 1

## 2018-06-26 MED ORDER — ONDANSETRON HCL 4 MG/2ML IJ SOLN: 4.0000 mg | Freq: Four times a day (QID) | INTRAMUSCULAR | Status: DC | PRN

## 2018-06-26 MED ORDER — ACETAMINOPHEN 325 MG PO TABS: 650.0000 mg | ORAL_TABLET | Freq: Four times a day (QID) | ORAL | Status: DC | PRN

## 2018-06-26 MED ORDER — POLYETHYLENE GLYCOL 3350 17 G PO PACK: 17.0000 g | PACK | Freq: Every day | ORAL | Status: DC | PRN

## 2018-06-26 MED ORDER — INSULIN GLARGINE 100 UNIT/ML ~~LOC~~ SOLN
8.0000 [IU] | Freq: Every day | SUBCUTANEOUS | Status: DC
  Administered 2018-06-26 – 2018-06-27 (×2): 8 [IU] via SUBCUTANEOUS
  Filled 2018-06-26 (×3): qty 0.08

## 2018-06-26 MED ORDER — APIXABAN 5 MG PO TABS
5.0000 mg | ORAL_TABLET | Freq: Two times a day (BID) | ORAL | Status: DC
  Administered 2018-06-26 – 2018-06-29 (×7): 5 mg via ORAL
  Filled 2018-06-26 (×7): qty 1

## 2018-06-26 MED ORDER — ACETAMINOPHEN 650 MG RE SUPP: 650.0000 mg | Freq: Four times a day (QID) | RECTAL | Status: DC | PRN

## 2018-06-26 MED ORDER — SODIUM CHLORIDE 0.9 % IV SOLN
1.0000 g | Freq: Once | INTRAVENOUS | Status: AC
  Administered 2018-06-26: 1 g via INTRAVENOUS
  Filled 2018-06-26: qty 10

## 2018-06-26 MED ORDER — ONDANSETRON HCL 4 MG PO TABS: 4.0000 mg | ORAL_TABLET | Freq: Four times a day (QID) | ORAL | Status: DC | PRN

## 2018-06-26 NOTE — H&P (Signed)
[facility] [place] [name]    MR#:  [id number]  DATE OF BIRTH:  May 25, 1929  DATE OF ADMISSION:  06/26/2018  PRIMARY CARE PHYSICIAN: [name], MD   REQUESTING/REFERRING PHYSICIAN: Dr. Delman Kitten  CHIEF COMPLAINT:   Chief Complaint  Patient presents with  . Fall  . Weakness    HISTORY OF PRESENT ILLNESS:  Tracy Mcmahon  is a 84 y.o. female with a known history of COPD not on home oxygen, hypertension, osteoarthritis, neuropathy, CKD stage III, diabetes mellitus brought in from home secondary to weakness and lethargy. Patient has had previous admissions for UTI.  She states at home with her son-in-law.  Ambulates with a walker.  For the last 2 to 3 days she has been sleeping a lot, very weak.  Has had some stool accidents and incontinence episodes.  Denies any fevers or chills.  Nausea present but no vomiting.  No recent travel or exposure to sick contacts.  Labs are within normal limits except urine is strongly positive for infection.  Cultures are pending.  She is being admitted for further treatment  PAST MEDICAL HISTORY:   Past Medical History:  Diagnosis Date  . Cancer ([place])   . Cataract   . Chronic kidney disease (CKD), stage III (moderate) (H[facility])   . Chronic urticaria   . COPD (chronic obstructive pulmonary disease) ([place])   . Depression   . Diabetes mellitus without complication ([place])   . DVT (deep venous thrombosis) (H[facility])    left leg  . Hyperlipemia   . Hypertension   . Osteoarthritis   . Osteopenia   . Polymyalgia rheumatica ([place])   . Polyneuropathy   . Renal cancer ([place])   . Renal insufficiency 08/10/2016   Chart indicates CKD stage 3  . Restless leg syndrome   . Vitamin D deficiency   . Vulvar intraepithelial neoplasia with lichen sclerosus     PAST SURGICAL HISTORY:   Past Surgical History:  Procedure Laterality Date  . ABDOMINAL HYSTERECTOMY    . APPENDECTOMY    . CATARACT EXTRACTION    .  COLONOSCOPY    . JOINT REPLACEMENT Bilateral    hip  . LAMINOTOMY    . NEPHRECTOMY    . TEMPORAL ARTERY BIOPSY / LIGATION    . TONSILLECTOMY      SOCIAL HISTORY:   Social History   Tobacco Use  . Smoking status: Never Smoker  . Smokeless tobacco: Never Used  Substance Use Topics  . Alcohol use: No    Alcohol/week: 0.0 oz    FAMILY HISTORY:   Family History  Problem Relation Age of Onset  . Hypertension Unknown   . Diabetes Unknown     DRUG ALLERGIES:   Allergies  Allergen Reactions  . Fosamax [Alendronate Sodium] Other (See Comments)    Reaction:  Made pt "very sick"  . Wellbutrin [Bupropion] Other (See Comments)    Reaction:  Unknown    REVIEW OF SYSTEMS:   Review of Systems  Constitutional: Positive for malaise/fatigue. Negative for chills, fever and weight loss.  HENT: Negative for ear discharge, ear pain, hearing loss, nosebleeds and tinnitus.   Eyes: Negative for blurred vision, double vision and photophobia.  Respiratory: Negative for cough, hemoptysis, shortness of breath and wheezing.   Cardiovascular: Negative for chest pain, palpitations, orthopnea and leg swelling.  Gastrointestinal: Negative for abdominal pain, constipation, diarrhea, heartburn, melena, nausea and vomiting.  Genitourinary: Negative for dysuria, frequency and  urgency.  Musculoskeletal: Negative for back pain, myalgias and neck pain.  Skin: Negative for rash.  Neurological: Positive for weakness. Negative for dizziness, tingling, tremors, sensory change, speech change, focal weakness and headaches.  Endo/Heme/Allergies: Does not bruise/bleed easily.  Psychiatric/Behavioral: Negative for depression.    MEDICATIONS AT HOME:   Prior to Admission medications   Medication Sig Start Date End Date Taking? Authorizing Provider  albuterol (PROVENTIL) (2.5 MG/3ML) 0.083% nebulizer solution Take 3 mLs (2.5 mg total) by nebulization every 4 (four) hours. 12/19/16  Yes Theodoro Grist, MD    alendronate (FOSAMAX) 35 MG tablet Take 35 mg by mouth every 7 (seven) days. Take with a full glass of water on an empty stomach.   Yes [provider]  amLODipine (NORVASC) 5 MG tablet Take 1 tablet (5 mg total) by mouth daily. 05/03/18  Yes Gladstone Lighter, MD  apixaban (ELIQUIS) 5 MG TABS tablet Take 1 tablet (5 mg total) by mouth 2 (two) times daily. 05/03/18  Yes Gladstone Lighter, MD  aspirin EC 81 MG tablet Take 81 mg by mouth daily.   Yes [provider]  atenolol (TENORMIN) 25 MG tablet Take 25 mg by mouth 2 (two) times daily.   Yes [provider]  azelastine (OPTIVAR) 0.05 % ophthalmic solution Place 1 drop into both eyes 2 (two) times daily as needed for itching. 06/19/18  Yes [provider]  diphenhydramine-acetaminophen (TYLENOL PM) 25-500 MG TABS tablet Take 1 tablet by mouth at bedtime as needed (sleep or pain).   Yes [provider]  FLUoxetine (PROZAC) 20 MG capsule Take 20 mg by mouth daily.   Yes [provider]  furosemide (LASIX) 20 MG tablet Take 20 mg by mouth daily.   Yes [provider]  gabapentin (NEURONTIN) 300 MG capsule Take 1 capsule (300MG ) by mouth every morning, 1 capsule (300MG ) every evening and 3 capsules (900MG ) at bedtime   Yes [provider]  insulin glargine (LANTUS) 100 UNIT/ML injection Inject 0.08 mLs (8 Units total) into the skin at bedtime. Patient taking differently: Inject 100 Units into the skin daily. Split dose into two 50 unit injections to total 100 units once daily in the morning. 05/03/18  Yes Gladstone Lighter, MD  Multiple Minerals-Vitamins (GNP CAL MAG ZINC +D3 PO) Take 1 tablet by mouth daily.   Yes [provider]  NOVOLOG FLEXPEN 100 UNIT/ML FlexPen Inject 10 Units into the skin 2 (two) times daily with a meal. 05/24/18  Yes [provider]  pravastatin (PRAVACHOL) 20 MG tablet Take 20 mg by mouth every evening.    Yes [provider]   predniSONE (DELTASONE) 5 MG tablet Take 7.5 mg by mouth daily.    Yes [provider]  vitamin B-12 (CYANOCOBALAMIN) 1000 MCG tablet Take 1,000 mcg by mouth daily.   Yes [provider]  Vitamin D, Ergocalciferol, (DRISDOL) 50000 UNITS CAPS capsule Take 50,000 Units by mouth every Monday.    Yes [provider]  tiotropium (SPIRIVA) 18 MCG inhalation capsule Place 1 capsule (18 mcg total) into inhaler and inhale daily. Patient not taking: Reported on 06/26/2018 12/20/16   Theodoro Grist, MD      VITAL SIGNS:  Blood pressure (!) 176/85, pulse (!) 102, temperature 99.2 F (37.3 C), temperature source Oral, resp. rate 20, height 5' (1.524 m), weight 90.7 kg (200 lb), SpO2 97 %.  PHYSICAL EXAMINATION:   Physical Exam  GENERAL:  84 y.o.-year-old patient lying in the bed with no acute distress.  EYES: Pupils equal, round, reactive to light and accommodation. No scleral icterus. Extraocular muscles intact.  HEENT: Head atraumatic, normocephalic. Oropharynx and nasopharynx clear.  NECK:  Supple, no jugular venous distention. No thyroid enlargement, no tenderness.  LUNGS: Normal breath sounds bilaterally, no wheezing, rales,rhonchi or crepitation. No use of accessory muscles of respiration. Decreased bibasilar breath sounds CARDIOVASCULAR: S1, S2 normal. No  rubs, or gallops. 3/6 systolic murmur present ABDOMEN: Soft, nontender, nondistended. Bowel sounds present. No organomegaly or mass.  EXTREMITIES: No pedal edema, cyanosis, or clubbing.  NEUROLOGIC: Cranial nerves II through XII are intact. Muscle strength 5/5 in all extremities. Sensation intact. Gait not checked. Global weakness noted. PSYCHIATRIC: The patient is alert and oriented x 3.  SKIN: No obvious rash, lesion, or ulcer.   LABORATORY PANEL:   CBC Recent Labs  Lab 06/26/18 0710  WBC 9.5  HGB 10.8*  HCT 32.8*  PLT 325    ------------------------------------------------------------------------------------------------------------------  Chemistries  Recent Labs  Lab 06/26/18 0710  NA 141  K 3.2*  CL 101  CO2 27  GLUCOSE 181*  BUN 16  CREATININE 1.80*  CALCIUM 9.0  AST 24  ALT 12  ALKPHOS 75  BILITOT 0.8   ------------------------------------------------------------------------------------------------------------------  Cardiac Enzymes Recent Labs  Lab 06/26/18 0710  TROPONINI 0.03*   ------------------------------------------------------------------------------------------------------------------  RADIOLOGY:  Dg Chest 2 View  Result Date: 06/26/2018 CLINICAL DATA:  Acute weakness and confusion for 1 day. EXAM: CHEST - 2 VIEW COMPARISON:  04/30/2018 and prior radiographs FINDINGS: Cardiomegaly again noted. Small LEFT pleural effusion/pleural thickening and mild LEFT basilar atelectasis/scarring again noted. There is no evidence of airspace disease, mass, pneumothorax, or acute bony abnormality. IMPRESSION: Cardiomegaly without evidence of acute cardiopulmonary disease. Electronically Signed   By: Margarette Canada M.D.   On: 06/26/2018 08:30    EKG:   Orders placed or performed during the hospital encounter of 06/26/18  . EKG 12-Lead  . EKG 12-Lead  . ED EKG  . ED EKG  . EKG 12-Lead  . EKG 12-Lead  . EKG 12-Lead  . EKG 12-Lead    IMPRESSION AND PLAN:   Tracy Mcmahon  is a 84 y.o. female with a known history of COPD not on home oxygen, hypertension, osteoarthritis, neuropathy, CKD stage III, diabetes mellitus brought in from home secondary to weakness and lethargy.  1.  [name] to acute cystitis -Urine and blood cultures are pending started on Rocephin -Physical therapy consult  2.  Diabetes mellitus-continue low-dose Lantus, add sliding scale insulin. -Monitor closely as previous history of hypoglycemic episodes.  3.  History of DVT-currently on Eliquis, continue  4.   Hypertension-on Norvasc, atenolol  6.  Depression-Paxil  7.  DVT prophylaxis-already on Eliquis.    All the records are reviewed and case discussed with ED provider. Management plans discussed with the patient, family and they are in agreement.  CODE STATUS:  Full Code  TOTAL TIME TAKING CARE OF THIS PATIENT: 50 minutes.    Gladstone Lighter M.D on 06/26/2018 at 2:25 PM  Between 7am to 6pm - Pager - [phone]  After 6pm go to [email or web address] - password [email or web address] [facility]  Office  [phone]  CC: Primary care physician; [name], MD

## 2018-06-26 NOTE — Progress Notes (Signed)
[facility]  Patient Status: Active   [facility] is providing the following services: SN  If patient discharges after hours, please call [phone].   Tracy Mcmahon 06/26/2018, 9:44 AM

## 2018-06-26 NOTE — ED Notes (Signed)
Elevated troponin reported to Dr Jacqualine Code - no new orders at this time

## 2018-06-26 NOTE — Evaluation (Signed)
Physical Therapy Evaluation Patient Details [name] MRN: [id number] DOB: 1929/01/22 Today's Date: 06/26/2018   History of Present Illness  84 yo female with onset of generalized weakness and fall at home was admitted with history of recent UTI and current elevated troponin.  Has some possible confusion and difficulty walking, using RW but this is PLOF.  PMHx:  PN, CKD 3, DM, CA, OA, DVT, polymyalgia rheumatica, COPD  Clinical Impression  Pt is up to walk with PT and has been assisted to BR.  While there noted pt was wiping blood from peri area and had been scratching with her finger nails.  Nursing was informed and per family request pt has a paper brief in place.  Will focus on weakness of LE"s and balance to restore her mobility with RW and to allow a transition home faster from SNF.  Follow acutely for same.    Follow Up Recommendations SNF    Equipment Recommendations  Rolling walker with 5" wheels(if pt does not have a good walker)    Recommendations for Other Services       Precautions / Restrictions Precautions Precautions: Fall(incontinent) Restrictions Weight Bearing Restrictions: No      Mobility  Bed Mobility Overal bed mobility: Needs Assistance Bed Mobility: Supine to Sit;Sit to Supine     Supine to sit: Mod assist Sit to supine: Mod assist   General bed mobility comments: used trendelenburg feature on bed to scoot up with 2 max assist  Transfers Overall transfer level: Needs assistance Equipment used: Rolling walker (2 wheeled);1 person hand held assist Transfers: Sit to/from Stand Sit to Stand: Mod assist         General transfer comment: mod assist to power up and control initial standing  Ambulation/Gait Ambulation/Gait assistance: Min guard;Min assist Gait Distance (Feet): 40 Feet Assistive device: Rolling walker (2 wheeled);1 person hand held assist Gait Pattern/deviations: Step-to pattern;Step-through pattern;Decreased stride  length;Wide base of support Gait velocity: reduced Gait velocity interpretation: <1.8 ft/sec, indicate of risk for recurrent falls General Gait Details: pt requires cues to direct walker as she is bumping wheels into furniture  Stairs            Wheelchair Mobility    Modified Rankin (Stroke Patients Only)       Balance Overall balance assessment: Needs assistance;History of Falls Sitting-balance support: Feet supported;Bilateral upper extremity supported Sitting balance-Leahy Scale: Fair     Standing balance support: Bilateral upper extremity supported;During functional activity Standing balance-Leahy Scale: Poor                               Pertinent Vitals/Pain Pain Assessment: No/denies pain    Home Living Family/patient expects to be discharged to:: Private residence Living Arrangements: Other relatives(son in [employer]) Available Help at Discharge: Family Type of Home: House Home Access: Stairs to enter Entrance Stairs-Rails: Left Entrance Stairs-Number of Steps: 2 Home Layout: One level Home Equipment: Walker - 2 wheels;Wheelchair - [employer];Tub bench Additional Comments: will want to go home with family when able    Prior Function Level of Independence: Needs assistance   Gait / Transfers Assistance Needed: recent fall with gait as she is walking at times with no AD  ADL's / Homemaking Assistance Needed: children cook and do cleaning, help with ADL's  Comments: 3L O2 at home     Hand Dominance   Dominant Hand: Right    Extremity/Trunk Assessment   Upper  Extremity Assessment Upper Extremity Assessment: Generalized weakness    Lower Extremity Assessment Lower Extremity Assessment: Generalized weakness    Cervical / Trunk Assessment Cervical / Trunk Assessment: Kyphotic  Communication   Communication: No difficulties  Cognition Arousal/Alertness: Awake/alert Behavior During Therapy: Impulsive Overall Cognitive Status:  Impaired/Different from baseline Area of Impairment: Following commands;Safety/judgement;Awareness;Problem solving                       Following Commands: Follows one step commands with increased time Safety/Judgement: Decreased awareness of safety;Decreased awareness of deficits Awareness: Intellectual Problem Solving: Decreased initiation;Difficulty sequencing;Requires verbal cues General Comments: Pt is up to walk with help and is incontinent of bowels and bladder      General Comments      Exercises     Assessment/Plan    PT Assessment Patient needs continued PT services  PT Problem List Decreased strength;Decreased range of motion;Decreased activity tolerance;Decreased balance;Decreased mobility;Decreased coordination;Decreased knowledge of use of DME;Decreased cognition;Decreased safety awareness;Obesity       PT Treatment Interventions DME instruction;Gait training;Stair training;Functional mobility training;Therapeutic activities;Therapeutic exercise;Balance training;Neuromuscular re-education;Patient/family education    PT Goals (Current goals can be found in the Care Plan section)  Acute Rehab PT Goals Patient Stated Goal: to walk and progress with gait PT Goal Formulation: With patient/family Time For Goal Achievement: 07/10/18 Potential to Achieve Goals: Good    Frequency Min 2X/week   Barriers to discharge Decreased caregiver support;Inaccessible home environment home with one family member and stairs to enter house    Co-evaluation               AM-PAC PT "6 Clicks" Daily Activity  Outcome Measure Difficulty turning over in bed (including adjusting bedclothes, sheets and blankets)?: A Little Difficulty moving from lying on back to sitting on the side of the bed? : Unable Difficulty sitting down on and standing up from a chair with arms (e.g., wheelchair, bedside commode, etc,.)?: Unable Help needed moving to and from a bed to chair  (including a wheelchair)?: A Little Help needed walking in hospital room?: A Little Help needed climbing 3-5 steps with a railing? : Total 6 Click Score: 12    End of Session Equipment Utilized During Treatment: Gait belt Activity Tolerance: Patient limited by fatigue;Other (comment);Patient limited by lethargy(recurrent UTI) Patient left: in bed;with call bell/phone within reach;with bed alarm set;with family/visitor present;with nursing/sitter in room Nurse Communication: Mobility status PT Visit Diagnosis: Unsteadiness on feet (R26.81);Muscle weakness (generalized) (M62.81);Adult, failure to thrive (R62.7);Difficulty in walking, not elsewhere classified (R26.2);History of falling (Z91.81)    Time: 0350-0938 PT Time Calculation (min) (ACUTE ONLY): 40 min   Charges:   PT Evaluation $PT Eval Moderate Complexity: 1 Mod PT Treatments $Gait Training: 8-22 mins $Therapeutic Exercise: 8-22 mins       Ramond Dial 06/26/2018, 5:31 PM   Mee Hives, PT MS Acute Rehab Dept. Number: [place] [facility] [place]

## 2018-06-26 NOTE — Progress Notes (Signed)
Inpatient Diabetes Program Recommendations  AACE/ADA: New Consensus Statement on Inpatient Glycemic Control (2019)  Target Ranges:  Prepandial:   less than 140 mg/dL      Peak postprandial:   less than 180 mg/dL (1-2 hours)      Critically ill patients:  140 - 180 mg/dL   Results for Tracy Mcmahon, Tracy Mcmahon (MRN [id number]) as of 06/26/2018 10:58  Ref. Range 06/26/2018 07:10  Glucose Latest Ref Range: 70 - 99 mg/dL 181 (H)   Review of Glycemic Control  Diabetes history: DM2 Outpatient Diabetes medications: Lantus 100 units daily (given in two injections of 50 units each for total of 100 units), Novolog 10 units BID with meals Current orders for Inpatient glycemic control: Lantus 8 units QHS (signed and held)  Inpatient Diabetes Program Recommendations: Insulin - Basal: Noted signed and held order for Lantus 8 units QHS. Correction (SSI): Please order CBGs with Novolog 0-15 units Q4H.  NOTE: In reviewing the chart, noted patient was an inpatient from 04/30/18 to 05/03/18 and was noted to have hypoglycemia. Per chart, patient was taking Lantus 100 units daily and NOvolog 20 units BID with meals prior to last hospitalization. Patient was discharged on 05/03/18 and instructed to decrease Lantus to 8 units QHS and to discontinue Novolog insulin.   Patient followed up with Dr. Raechel Ache on 05/14/18 and per office note, patient's daughter reported glucose was consistently in the 200's mg/dl at home with highest glucose 259 mg/dl. Per office note by Dr. Raechel Ache on 05/14/18 he noted "Poorly controlled by 02/12/2018 hemoglobin A1c of 9.6%, and control sounds worse since return from [facility]. Suspect that discharge instructions to take 8 units of Lantus were incorrect by a factor of 10. Advised increasing Lantus back to 80 units once daily, and if her appetite is normal, going back to 20 units of NovoLog with each meal. Advised that she not be given NovoLog unless she is eating a good meal, and not be given NovoLog if she is sick and  oral intake decreases. Follow up with [facility] as scheduled 09/08/2018."  Initial lab glucose 181 mg/dl today and noted Lantus 8 units QHS ordered (signed and held). Recommend ordering CBGs with Novolog 0-15 units Q4H to allow more frequent monitoring and correcting (with Novolog) if needed.   Thanks, Barnie Alderman, RN, MSN, CDE Diabetes Coordinator Inpatient Diabetes Program [phone] (Team Pager from 8am to 5pm)

## 2018-06-26 NOTE — ED Provider Notes (Signed)
[facility] Emergency Department Provider Note   ____________________________________________   First MD Initiated Contact with Patient 06/26/18 [phone]     (approximate)  I have reviewed the triage vital signs and the nursing notes.   HISTORY  Chief Complaint Fall and Weakness  EM caveat: Some confusion, history is obtained from the patient's son-in-law who she lives with via phone  HPI Tracy Mcmahon is a 84 y.o. female who herself denotes that she just does not feel well.  She cannot really explain why and does not have any specific symptoms other than she does feel slightly nauseated and reports that she felt this way once in the past when she thinks she had a urine infection.  Of note, she cannot recall the year the day or situation for why she is here just reporting she is "does not feel well".  She does specifically deny headache chest pain trouble breathing or abdominal pain.  Does feel some nausea  Spoke with the patient's son-in-law, he lives with her and reports that this morning she had sat down on the floor after getting out of bed.  She did not fall that he is aware of, but was rather sitting on the floor just feeling very weak and seeming confused.  Reports this is happened before when she is had UTIs, and the situation today has been the same.  She seemed a little bit confused yesterday, but then this morning also.   Past Medical History:  Diagnosis Date  . Cancer ([place])   . Cataract   . Chronic kidney disease (CKD), stage III (moderate) (HCC)   . Chronic urticaria   . COPD (chronic obstructive pulmonary disease) ([place])   . Depression   . Diabetes mellitus without complication ([place])   . DVT (deep venous thrombosis) (HCC)    left leg  . Hyperlipemia   . Hypertension   . Osteoarthritis   . Osteopenia   . Polymyalgia rheumatica ([place])   . Polyneuropathy   . Renal cancer ([place])   . Renal insufficiency 08/10/2016   Chart indicates CKD stage 3    . Restless leg syndrome   . Vitamin D deficiency   . Vulvar intraepithelial neoplasia with lichen sclerosus     Patient Active Problem List   Diagnosis Date Noted  . Hypoglycemia 04/30/2018  . UTI (urinary tract infection) 03/15/2018  . Acute encephalopathy 12/19/2016  . Generalized weakness 12/19/2016  . Acute on chronic respiratory failure with hypoxia and hypercapnia ([place]) 12/16/2016  . COPD exacerbation ([place]) 12/16/2016  . Community acquired pneumonia 12/16/2016  . Lactic acidosis 12/16/2016  . Sepsis ([place]) 12/16/2016  . PE (pulmonary embolism) 08/11/2016  . COPD (chronic obstructive pulmonary disease) ([place]) 03/29/2015  . Hypertension 03/29/2015  . Diabetes ([place]) 03/29/2015  . Hyperlipidemia 03/29/2015  . Depression 03/29/2015  . Restless leg syndrome 03/29/2015  . DVT (deep venous thrombosis) ([place]) 03/29/2015  . CKD (chronic kidney disease), stage III ([place]) 03/29/2015    Past Surgical History:  Procedure Laterality Date  . ABDOMINAL HYSTERECTOMY    . APPENDECTOMY    . CATARACT EXTRACTION    . COLONOSCOPY    . JOINT REPLACEMENT Bilateral    hip  . LAMINOTOMY    . NEPHRECTOMY    . TEMPORAL ARTERY BIOPSY / LIGATION    . TONSILLECTOMY      Prior to Admission medications   Medication Sig Start Date End Date Taking? Authorizing Provider  albuterol (PROVENTIL) (2.5 MG/3ML) 0.083% nebulizer solution Take  3 mLs (2.5 mg total) by nebulization every 4 (four) hours. 12/19/16  Yes Theodoro Grist, MD  alendronate (FOSAMAX) 35 MG tablet Take 35 mg by mouth every 7 (seven) days. Take with a full glass of water on an empty stomach.   Yes [provider]  amLODipine (NORVASC) 5 MG tablet Take 1 tablet (5 mg total) by mouth daily. 05/03/18  Yes Gladstone Lighter, MD  apixaban (ELIQUIS) 5 MG TABS tablet Take 1 tablet (5 mg total) by mouth 2 (two) times daily. 05/03/18  Yes Gladstone Lighter, MD  aspirin EC 81 MG tablet Take 81 mg by mouth daily.   Yes [provider]   atenolol (TENORMIN) 25 MG tablet Take 25 mg by mouth 2 (two) times daily.   Yes [provider]  azelastine (OPTIVAR) 0.05 % ophthalmic solution Place 1 drop into both eyes 2 (two) times daily as needed for itching. 06/19/18  Yes [provider]  diphenhydramine-acetaminophen (TYLENOL PM) 25-500 MG TABS tablet Take 1 tablet by mouth at bedtime as needed (sleep or pain).   Yes [provider]  FLUoxetine (PROZAC) 20 MG capsule Take 20 mg by mouth daily.   Yes [provider]  furosemide (LASIX) 20 MG tablet Take 20 mg by mouth daily.   Yes [provider]  gabapentin (NEURONTIN) 300 MG capsule Take 1 capsule (300MG ) by mouth every morning, 1 capsule (300MG ) every evening and 3 capsules (900MG ) at bedtime   Yes [provider]  insulin glargine (LANTUS) 100 UNIT/ML injection Inject 0.08 mLs (8 Units total) into the skin at bedtime. Patient taking differently: Inject 100 Units into the skin daily. Split dose into two 50 unit injections to total 100 units once daily in the morning. 05/03/18  Yes Gladstone Lighter, MD  Multiple Minerals-Vitamins (GNP CAL MAG ZINC +D3 PO) Take 1 tablet by mouth daily.   Yes [provider]  NOVOLOG FLEXPEN 100 UNIT/ML FlexPen Inject 10 Units into the skin 2 (two) times daily with a meal. 05/24/18  Yes [provider]  pravastatin (PRAVACHOL) 20 MG tablet Take 20 mg by mouth every evening.    Yes [provider]  predniSONE (DELTASONE) 5 MG tablet Take 7.5 mg by mouth daily.    Yes [provider]  vitamin B-12 (CYANOCOBALAMIN) 1000 MCG tablet Take 1,000 mcg by mouth daily.   Yes [provider]  Vitamin D, Ergocalciferol, (DRISDOL) 50000 UNITS CAPS capsule Take 50,000 Units by mouth every Monday.    Yes [provider]  tiotropium (SPIRIVA) 18 MCG inhalation capsule Place 1 capsule (18 mcg total) into inhaler and inhale daily. Patient not taking: Reported on 06/26/2018  12/20/16   Theodoro Grist, MD    Allergies Fosamax [alendronate sodium] and Wellbutrin [bupropion]  Family History  Problem Relation Age of Onset  . Hypertension Unknown   . Diabetes Unknown     Social History Social History   Tobacco Use  . Smoking status: Never Smoker  . Smokeless tobacco: Never Used  Substance Use Topics  . Alcohol use: No    Alcohol/week: 0.0 oz  . Drug use: No    Review of Systems EM caveat Constitutional: No fever/chills Eyes: No visual changes. Cardiovascular: Denies chest pain. Respiratory: Denies shortness of breath. Gastrointestinal: No abdominal pain.  Genitourinary: She is not certain but thinks she might have another UTI Musculoskeletal: Negative for back pain. Skin: Negative for rash. Neurological: Negative for headaches or focal weakness.    ____________________________________________   PHYSICAL EXAM:  VITAL SIGNS: ED Triage Vitals  Enc Vitals Group     BP 06/26/18 0704 (!) 162/76     Pulse Rate 06/26/18 0704 (!) 105     Resp 06/26/18 0704 20     Temp 06/26/18 0704 98.2 F (36.8 C)     Temp Source 06/26/18 0704 Oral     SpO2 06/26/18 0704 98 %     Weight 06/26/18 0705 200 lb (90.7 kg)     Height 06/26/18 0705 5' (1.524 m)     Head Circumference --      Peak Flow --      Pain Score 06/26/18 0704 0     Pain Loc --      Pain Edu? --      Excl. in [place]? --     Constitutional: Alert and oriented to self and some of situation, but not to year or date.  She is very pleasant..  Mildly ill-appearing but in no distress.  Eyes: Conjunctivae are normal. Head: Atraumatic. Nose: No congestion/rhinnorhea. Mouth/Throat: Mucous membranes are moist. Neck: No stridor.  No cervical tenderness. Cardiovascular: Normal rate, regular rhythm. Grossly normal heart sounds.  Good peripheral circulation. Respiratory: Normal respiratory effort.  No retractions. Lungs CTAB. Gastrointestinal: Soft and nontender. No distention. Musculoskeletal: No  lower extremity tenderness nor edema.  Moves all extremities well to command 5 out of 5.  No focal sensory deficits on the extremities or face.  Equal grips  neurologic:  Normal speech and language. No gross focal neurologic deficits are appreciated.  Symmetric smile. Skin:  Skin is warm, dry and intact. No rash noted. Psychiatric: Mood and affect are slightly flat.  ____________________________________________   LABS (all labs ordered are listed, but only abnormal results are displayed)  Labs Reviewed  CBC - Abnormal; Notable for the following components:      Result Value   Hemoglobin 10.8 (*)    HCT 32.8 (*)    RDW 17.3 (*)    All other components within normal limits  URINALYSIS, COMPLETE (UACMP) WITH MICROSCOPIC - Abnormal; Notable for the following components:   Color, Urine AMBER (*)    APPearance CLOUDY (*)    Hgb urine dipstick SMALL (*)    Ketones, ur 5 (*)    Protein, ur 100 (*)    Nitrite POSITIVE (*)    Leukocytes, UA LARGE (*)    WBC, UA >50 (*)    Bacteria, UA MANY (*)    All other components within normal limits  COMPREHENSIVE METABOLIC PANEL - Abnormal; Notable for the following components:   Potassium 3.2 (*)    Glucose, Bld 181 (*)    Creatinine, Ser 1.80 (*)    GFR calc non Af Amer 24 (*)    GFR calc Af Amer 28 (*)    All other components within normal limits  TROPONIN I - Abnormal; Notable for the following components:   Troponin I 0.03 (*)    All other components within normal limits   ____________________________________________  EKG  Reviewed and interpreted by me at 7:05 AM Heart rate 105 QRS 120 QTc 490 Sinus tachycardia, left bundle branch block. ____________________________________________  RADIOLOGY  Dg Chest 2 View  Result Date: 06/26/2018 CLINICAL DATA:  Acute weakness and confusion for 1 day. EXAM: CHEST - 2 VIEW COMPARISON:  04/30/2018 and prior radiographs FINDINGS: Cardiomegaly again noted. Small LEFT pleural effusion/pleural  thickening and mild LEFT basilar atelectasis/scarring again noted. There is no evidence of airspace disease, mass, pneumothorax, or acute bony  abnormality. IMPRESSION: Cardiomegaly without evidence of acute cardiopulmonary disease. Electronically Signed   By: Margarette Canada M.D.   On: 06/26/2018 08:30    Chest x-ray negative for acute ____________________________________________   PROCEDURES  Procedure(s) performed: None  Procedures  Critical Care performed: No  ____________________________________________   INITIAL IMPRESSION / ASSESSMENT AND PLAN / ED COURSE  Pertinent labs & imaging results that were available during my care of the patient were reviewed by me and considered in my medical decision making (see chart for details).  Patient presents for weakness and increased confusion with a history of UTI similar presentation just a couple months ago.  Son-in-law reports same symptoms occurring in the last day.  She has no evidence of focal abnormality neurologically, find no evidence of trauma and see no evidence that an emergent CT of the head is necessitated at this time.  She is awake and alert but somewhat disoriented and just slightly encephalopathic with a slightly flat affect and some confusion.  Labs reviewed, appears most consistent with a recurrent urinary tract infection.  Previous culture indicates sensitivity to ceftriaxone, will order here.  Discussed with the patient's son in law phone, agreeable and understanding of plan for admission.  Additionally, patient agreeable with plan for admission.  Will treat for UTI, monitor for improvement.  Discussed with Dr. Verdell Carmine of the hospitalist service will evaluate for further treatment and hospitalization.      ____________________________________________   FINAL CLINICAL IMPRESSION(S) / ED DIAGNOSES  Final diagnoses:  Urinary tract infection, acute  Encephalopathy  Confusion      NEW MEDICATIONS STARTED DURING THIS  VISIT:  New Prescriptions   No medications on file     Note:  This document was prepared using Dragon voice recognition software and may include unintentional dictation errors.     Delman Kitten, MD 06/26/18 1004

## 2018-06-26 NOTE — ED Triage Notes (Signed)
Pt arrived via ems for report of fall and weakness - she reports that she may have UTI again and has chronic lower back pain

## 2018-06-27 LAB — BASIC METABOLIC PANEL
ANION GAP: 8 (ref 5–15)
BUN: 15 mg/dL (ref 8–23)
CHLORIDE: 104 mmol/L (ref 98–111)
CO2: 30 mmol/L (ref 22–32)
Calcium: 8.5 mg/dL — ABNORMAL LOW (ref 8.9–10.3)
Creatinine, Ser: 1.66 mg/dL — ABNORMAL HIGH (ref 0.44–1.00)
GFR calc Af Amer: 31 mL/min — ABNORMAL LOW (ref >60)
GFR calc non Af Amer: 27 mL/min — ABNORMAL LOW (ref >60)
GLUCOSE: 152 mg/dL — AB (ref 70–99)
POTASSIUM: 3.6 mmol/L (ref 3.5–5.1)
Sodium: 142 mmol/L (ref 135–145)

## 2018-06-27 LAB — CBC
HEMATOCRIT: 29.4 % — AB (ref 35.0–47.0)
HEMOGLOBIN: 9.6 g/dL — AB (ref 12.0–16.0)
MCH: 27.6 pg (ref 26.0–34.0)
MCHC: 32.8 g/dL (ref 32.0–36.0)
MCV: 84.3 fL (ref 80.0–100.0)
Platelets: 281 10*3/uL (ref 150–440)
RBC: 3.49 MIL/uL — ABNORMAL LOW (ref 3.80–5.20)
RDW: 17.6 % — ABNORMAL HIGH (ref 11.5–14.5)
WBC: 7.3 10*3/uL (ref 3.6–11.0)

## 2018-06-27 LAB — GLUCOSE, CAPILLARY
GLUCOSE-CAPILLARY: 194 mg/dL — AB (ref 70–99)
GLUCOSE-CAPILLARY: 213 mg/dL — AB (ref 70–99)
GLUCOSE-CAPILLARY: 187 mg/dL — AB (ref 70–99)
Glucose-Capillary: 129 mg/dL — ABNORMAL HIGH (ref 70–99)
Glucose-Capillary: 197 mg/dL — ABNORMAL HIGH (ref 70–99)

## 2018-06-27 NOTE — Progress Notes (Signed)
Tracy Mcmahon at [place] [name]    MR#:  [id number]  DATE OF BIRTH:  [birth year]  SUBJECTIVE:  CHIEF COMPLAINT:   Chief Complaint  Patient presents with  . Fall  . Weakness   -More perky today, however appears forgetful. -Pending cultures.  Afebrile  REVIEW OF SYSTEMS:  Review of Systems  Constitutional: Positive for malaise/fatigue. Negative for chills and fever.  HENT: Positive for hearing loss.   Eyes: Negative for blurred vision and double vision.  Respiratory: Negative for cough, shortness of breath and wheezing.   Cardiovascular: Negative for chest pain and palpitations.  Gastrointestinal: Negative for abdominal pain, constipation, diarrhea, nausea and vomiting.  Genitourinary: Negative for dysuria.  Musculoskeletal: Negative for myalgias.  Neurological: Positive for weakness. Negative for dizziness, speech change, focal weakness, seizures and headaches.    DRUG ALLERGIES:   Allergies  Allergen Reactions  . Fosamax [Alendronate Sodium] Other (See Comments)    Reaction:  Made pt "very sick"  . Wellbutrin [Bupropion] Other (See Comments)    Reaction:  Unknown    VITALS:  Blood pressure (!) 164/91, pulse 79, temperature 98 F (36.7 C), temperature source Oral, resp. rate 16, height 5' (1.524 m), weight 90.7 kg (200 lb), SpO2 92 %.  PHYSICAL EXAMINATION:  Physical Exam  GENERAL:  84 y.o.-year-old patient lying in the bed with no acute distress.  EYES: Pupils equal, round, reactive to light and accommodation. No scleral icterus. Extraocular muscles intact.  HEENT: Head atraumatic, normocephalic. Oropharynx and nasopharynx clear.  NECK:  Supple, no jugular venous distention. No thyroid enlargement, no tenderness.  LUNGS: Normal breath sounds bilaterally, no wheezing, rales,rhonchi or crepitation. No use of accessory muscles of respiration. Decreased bibasilar breath sounds CARDIOVASCULAR: S1, S2 normal. No  rubs,  or gallops. 3/6 systolic murmur present ABDOMEN: Soft, nontender, nondistended. Bowel sounds present. No organomegaly or mass.  EXTREMITIES: No pedal edema, cyanosis, or clubbing.  NEUROLOGIC: Cranial nerves II through XII are intact. Muscle strength 5/5 in all extremities. Sensation intact. Gait not checked. Global weakness noted. PSYCHIATRIC: The patient is alert and oriented x 3. forgetful SKIN: No obvious rash, lesion, or ulcer.     LABORATORY PANEL:   CBC Recent Labs  Lab 06/27/18 0452  WBC 7.3  HGB 9.6*  HCT 29.4*  PLT 281   ------------------------------------------------------------------------------------------------------------------  Chemistries  Recent Labs  Lab 06/26/18 0710 06/27/18 0452  NA 141 142  K 3.2* 3.6  CL 101 104  CO2 27 30  GLUCOSE 181* 152*  BUN 16 15  CREATININE 1.80* 1.66*  CALCIUM 9.0 8.5*  AST 24  --   ALT 12  --   ALKPHOS 75  --   BILITOT 0.8  --    ------------------------------------------------------------------------------------------------------------------  Cardiac Enzymes Recent Labs  Lab 06/26/18 0710  TROPONINI 0.03*   ------------------------------------------------------------------------------------------------------------------  RADIOLOGY:  Dg Chest 2 View  Result Date: 06/26/2018 CLINICAL DATA:  Acute weakness and confusion for 1 day. EXAM: CHEST - 2 VIEW COMPARISON:  04/30/2018 and prior radiographs FINDINGS: Cardiomegaly again noted. Small LEFT pleural effusion/pleural thickening and mild LEFT basilar atelectasis/scarring again noted. There is no evidence of airspace disease, mass, pneumothorax, or acute bony abnormality. IMPRESSION: Cardiomegaly without evidence of acute cardiopulmonary disease. Electronically Signed   By: Tracy Mcmahon M.D.   On: 06/26/2018 08:30    EKG:   Orders placed or performed during the hospital encounter of 06/26/18  . EKG 12-Lead  . EKG 12-Lead  . ED  EKG  . ED EKG  . EKG 12-Lead  .  EKG 12-Lead  . EKG 12-Lead  . EKG 12-Lead    ASSESSMENT AND PLAN:   Tracy Mcmahon  is a 84 y.o. female with a known history of COPD not on home oxygen, hypertension, osteoarthritis, neuropathy, CKD stage III, diabetes mellitus brought in from home secondary to weakness and lethargy.  1.  Tracy Mcmahon to acute cystitis -Urine and blood cultures are pending  - started on Rocephin -Physical therapy consulted  2.  Diabetes mellitus-continue low-dose Lantus, add sliding scale insulin. -Monitor closely as previous history of hypoglycemic episodes.  3.  History of DVT-currently on Eliquis, continue  4.  Hypertension-on Norvasc, atenolol  6.  Depression-Paxil  7.  DVT prophylaxis-already on Eliquis.  8.  COPD- stable, stable for now - monitor, nebs   Physical therapy recommended rehab.  Consult Education officer, museum.   All the records are reviewed and case discussed with Care Management/Social Workerr. Management plans discussed with the patient, family and they are in agreement.  CODE STATUS: Full Code  TOTAL TIME TAKING CARE OF THIS PATIENT: 38 minutes.   POSSIBLE D/C IN 2 DAYS, DEPENDING ON CLINICAL CONDITION.   Tracy Mcmahon M.D on 06/27/2018 at 12:43 PM  Between 7am to 6pm - Pager - [phone]  After 6pm go to [email or web address] - password [email or web address]  Office  [phone]  CC: Primary care physician; Tracy Kayser, MD

## 2018-06-27 NOTE — Plan of Care (Signed)

## 2018-06-28 LAB — BASIC METABOLIC PANEL
ANION GAP: 8 (ref 5–15)
BUN: 21 mg/dL (ref 8–23)
CO2: 31 mmol/L (ref 22–32)
Calcium: 8.4 mg/dL — ABNORMAL LOW (ref 8.9–10.3)
Chloride: 103 mmol/L (ref 98–111)
Creatinine, Ser: 1.72 mg/dL — ABNORMAL HIGH (ref 0.44–1.00)
GFR, EST AFRICAN AMERICAN: 30 mL/min — AB (ref >60)
GFR, EST NON AFRICAN AMERICAN: 26 mL/min — AB (ref >60)
Glucose, Bld: 158 mg/dL — ABNORMAL HIGH (ref 70–99)
Potassium: 3.4 mmol/L — ABNORMAL LOW (ref 3.5–5.1)
Sodium: 142 mmol/L (ref 135–145)

## 2018-06-28 LAB — GLUCOSE, CAPILLARY
GLUCOSE-CAPILLARY: 117 mg/dL — AB (ref 70–99)
GLUCOSE-CAPILLARY: 211 mg/dL — AB (ref 70–99)
Glucose-Capillary: 222 mg/dL — ABNORMAL HIGH (ref 70–99)
Glucose-Capillary: 180 mg/dL — ABNORMAL HIGH (ref 70–99)

## 2018-06-28 MED ORDER — POTASSIUM CHLORIDE CRYS ER 20 MEQ PO TBCR
40.0000 meq | EXTENDED_RELEASE_TABLET | Freq: Once | ORAL | Status: AC
  Administered 2018-06-28: 40 meq via ORAL
  Filled 2018-06-28: qty 2

## 2018-06-28 MED ORDER — INSULIN GLARGINE 100 UNIT/ML ~~LOC~~ SOLN
10.0000 [IU] | Freq: Every day | SUBCUTANEOUS | Status: DC
  Administered 2018-06-28: 22:00:00 10 [IU] via SUBCUTANEOUS
  Filled 2018-06-28 (×2): qty 0.1

## 2018-06-28 NOTE — Clinical Social Work Note (Signed)
Clinical Social Work Assessment  Patient Details  [name] MRN: [id number] Date of Birth: [birth year]  Date of referral:  06/28/18               Reason for consult:  Facility Placement                Permission sought to share information with:  Chartered certified accountant granted to share information::  Yes, Verbal Permission Granted  Name::        Agency::  [facility] area SNFs  Relationship::     Contact Information:     Housing/Transportation Living arrangements for the past 2 months:  [place] of Information:  Patient, Medical Team, Adult Children Patient Interpreter Needed:  None Criminal Activity/Legal Involvement Pertinent to Current Situation/Hospitalization:  No - Comment as needed Significant Relationships:  Adult Children, [place], [employer] Lives with:  Adult Children Do you feel safe going back to the place where you live?  Yes Need for family participation in patient care:  No (Coment)  Care giving concerns:  PT recommendation for SNF; primary caregiver not able to provide direct care   Social Worker assessment / plan:  The CSW met with the patient and her middle daughter at bedside to discuss discharge planning. The patient and her daughter verbalized permission to begin a SNF bed search and named [facility] as the preference. The patient lives with her youngest daughter and son-in-law; however, her daughter is currently in [place] secondary to osteomyelitis and partial removal of a foot. The patient recognizes that her son-in-law will not be able to care for both her and her daughter, and she is amenable to SNF.  The family is also suffering the imminent death of her oldest daughter's husband who is currently in hosp[facility] care at the [facility]. The CSW provided emotional support and a bedside brief intervention for grief processing through discussion of positive memories and acceptance.  Employment status:   Retired Nurse, adult PT Recommendations:  [place] / Referral to community resources:  [place]  Patient/Family's Response to care:  The patient and her daughter were gracious and in good spirits.  Patient/Family's Understanding of and Emotional Response to Diagnosis, Current Treatment, and Prognosis:  The patient fully understands the discharge plan and is in agreement, as is her family.  Emotional Assessment Appearance:  Appears younger than stated age Attitude/Demeanor/Rapport:  Engaged, Self-Confident, Charismatic, Gracious Affect (typically observed):  Pleasant, Appropriate Orientation:  Oriented to Self, Oriented to Place, Oriented to  Time, Oriented to Situation Alcohol / Substance use:  Never Used Psych involvement (Current and /or in the community):  No (Comment)  Discharge Needs  Concerns to be addressed:  Care Coordination, Discharge Planning Concerns Readmission within the last 30 days:  No Current discharge risk:  Chronically ill, Lack of support system Barriers to Discharge:  Continued Medical Work up   [employer], LCSW 06/28/2018, 10:15 AM

## 2018-06-28 NOTE — Plan of Care (Signed)
  Problem: Education: Goal: Knowledge of General Education information will improve Description Including pain rating scale, medication(s)/side effects and non-pharmacologic comfort measures Outcome: Progressing   

## 2018-06-28 NOTE — Progress Notes (Signed)
[facility] [place] [name]    MR#:  [id number]  DATE OF BIRTH:  [birth year]  SUBJECTIVE:  CHIEF COMPLAINT:   Chief Complaint  Patient presents with  . Fall  . Weakness   -Feeling better.  Physical therapy recommended rehab -Urine cultures are pending  REVIEW OF SYSTEMS:  Review of Systems  Constitutional: Positive for malaise/fatigue. Negative for chills and fever.  HENT: Positive for hearing loss.   Eyes: Negative for blurred vision and double vision.  Respiratory: Negative for cough, shortness of breath and wheezing.   Cardiovascular: Negative for chest pain and palpitations.  Gastrointestinal: Negative for abdominal pain, constipation, diarrhea, nausea and vomiting.  Genitourinary: Negative for dysuria.  Musculoskeletal: Negative for myalgias.  Neurological: Positive for weakness. Negative for dizziness, speech change, focal weakness, seizures and headaches.    DRUG ALLERGIES:   Allergies  Allergen Reactions  . Fosamax [Alendronate Sodium] Other (See Comments)    Reaction:  Made pt "very sick"  . Wellbutrin [Bupropion] Other (See Comments)    Reaction:  Unknown    VITALS:  Blood pressure 120/65, pulse (!) 56, temperature 98 F (36.7 C), temperature source Oral, resp. rate 18, height 5' (1.524 m), weight 90.7 kg (200 lb), SpO2 92 %.  PHYSICAL EXAMINATION:  Physical Exam  GENERAL:  84 y.o.-year-old patient lying in the bed with no acute distress.  EYES: Pupils equal, round, reactive to light and accommodation. No scleral icterus. Extraocular muscles intact.  HEENT: Head atraumatic, normocephalic. Oropharynx and nasopharynx clear.  NECK:  Supple, no jugular venous distention. No thyroid enlargement, no tenderness.  LUNGS: Normal breath sounds bilaterally, no wheezing, rales,rhonchi or crepitation. No use of accessory muscles of respiration. Decreased bibasilar breath sounds CARDIOVASCULAR: S1, S2 normal. No   rubs, or gallops. 3/6 systolic murmur present ABDOMEN: Soft, nontender, nondistended. Bowel sounds present. No organomegaly or mass.  EXTREMITIES: No pedal edema, cyanosis, or clubbing.  NEUROLOGIC: Cranial nerves II through XII are intact. Muscle strength 5/5 in all extremities. Sensation intact. Gait not checked. Global weakness noted. PSYCHIATRIC: The patient is alert and oriented x 3. forgetful and intermittent confusion noted SKIN: No obvious rash, lesion, or ulcer.     LABORATORY PANEL:   CBC Recent Labs  Lab 06/27/18 0452  WBC 7.3  HGB 9.6*  HCT 29.4*  PLT 281   ------------------------------------------------------------------------------------------------------------------  Chemistries  Recent Labs  Lab 06/26/18 0710  06/28/18 0504  NA 141   < > 142  K 3.2*   < > 3.4*  CL 101   < > 103  CO2 27   < > 31  GLUCOSE 181*   < > 158*  BUN 16   < > 21  CREATININE 1.80*   < > 1.72*  CALCIUM 9.0   < > 8.4*  AST 24  --   --   ALT 12  --   --   ALKPHOS 75  --   --   BILITOT 0.8  --   --    < > = values in this interval not displayed.   ------------------------------------------------------------------------------------------------------------------  Cardiac Enzymes Recent Labs  Lab 06/26/18 0710  TROPONINI 0.03*   ------------------------------------------------------------------------------------------------------------------  RADIOLOGY:  No results found.  EKG:   Orders placed or performed during the hospital encounter of 06/26/18  . EKG 12-Lead  . EKG 12-Lead  . ED EKG  . ED EKG  . EKG 12-Lead  . EKG 12-Lead  . EKG 12-Lead  .  EKG 12-Lead  . EKG    ASSESSMENT AND PLAN:   Tracy Mcmahon  is a 84 y.o. female with a known history of COPD not on home oxygen, hypertension, osteoarthritis, neuropathy, CKD stage III, diabetes mellitus brought in from home secondary to weakness and lethargy.  1.  Tracy Mcmahon to acute cystitis -Urine cultures are  growing gram-negative rods.  Blood cultures are negative - on Rocephin -Physical therapy consulted  2.  Diabetes mellitus-continue low-dose Lantus, on sliding scale insulin. -Monitor closely as previous history of hypoglycemic episodes.  3.  History of DVT-currently on Eliquis, continue  4.  Hypertension-on Norvasc, atenolol  6.  Depression-Paxil  7.  DVT prophylaxis-already on Eliquis.  8.  COPD- stable, stable for now - monitor, nebs - prn o2- uses nocturnal home o2   Physical therapy recommended rehab.  Consulted Education officer, museum. discharge in 1-2 days   All the records are reviewed and case discussed with Care Management/Social Workerr. Management plans discussed with the patient, family and they are in agreement.  CODE STATUS: Full Code  TOTAL TIME TAKING CARE OF THIS PATIENT: 33 minutes.   POSSIBLE D/C IN 1-2 DAYS, DEPENDING ON CLINICAL CONDITION.   Tracy Mcmahon M.D on 06/28/2018 at 11:22 AM  Between 7am to 6pm - Pager - [phone]  After 6pm go to [email or web address] - password [email or web address] [facility]  Office  [phone]  CC: Primary care physician; Tracy Kayser, MD

## 2018-06-28 NOTE — NC FL2 (Signed)
[place] LEVEL OF CARE SCREENING TOOL     IDENTIFICATION  Patient [name] Birthdate: 1929/06/16 Sex: female Admission Date (Current Location): 06/26/2018  [place] and [place] Number:  Engineering geologist and Address:  [facility], [address] [place], [place] [address]      Provider Number: [id number]  Attending Physician Name and Address:  Gladstone Lighter, MD  Relative Name and Phone Number:  [name] (daughter) [phone]; Herma Mering (Daughter) [phone]; Smith Mince (daughter) [phone]    Current Level of Care: Hospital Recommended Level of Care: [place] Prior Approval Number:    Date Approved/Denied:   PASRR Number: [id number]  Discharge Plan: SNF    Current Diagnoses: Patient Active Problem List   Diagnosis Date Noted  . Weakness 06/26/2018  . Hypoglycemia 04/30/2018  . UTI (urinary tract infection) 03/15/2018  . Acute encephalopathy 12/19/2016  . Generalized weakness 12/19/2016  . Acute on chronic respiratory failure with hypoxia and hypercapnia ([place]) 12/16/2016  . COPD exacerbation ([place]) 12/16/2016  . Community acquired pneumonia 12/16/2016  . Lactic acidosis 12/16/2016  . Sepsis ([place]) 12/16/2016  . PE (pulmonary embolism) 08/11/2016  . COPD (chronic obstructive pulmonary disease) ([place]) 03/29/2015  . Hypertension 03/29/2015  . Diabetes ([place]) 03/29/2015  . Hyperlipidemia 03/29/2015  . Depression 03/29/2015  . Restless leg syndrome 03/29/2015  . DVT (deep venous thrombosis) ([place]) 03/29/2015  . CKD (chronic kidney disease), stage III ([place]) 03/29/2015    Orientation RESPIRATION BLADDER Height & Weight     Self, Time, Situation, Place  Normal Continent Weight: 200 lb (90.7 kg) Height:  5' (152.4 cm)  BEHAVIORAL SYMPTOMS/MOOD NEUROLOGICAL BOWEL NUTRITION STATUS      Continent Diet(Heart healthy/Carb modified)  AMBULATORY STATUS COMMUNICATION OF NEEDS  Skin   Extensive Assist Verbally Normal                       Personal Care Assistance Level of Assistance  Bathing, Feeding, Dressing Bathing Assistance: Limited assistance Feeding assistance: Independent Dressing Assistance: Limited assistance     Functional Limitations Info  Sight, Hearing, Speech Sight Info: Adequate Hearing Info: Adequate Speech Info: Adequate    SPECIAL CARE FACTORS FREQUENCY  PT (By licensed PT)     PT Frequency: Up to 5X per week              Contractures Contractures Info: Not present    Additional Factors Info  Code Status, Allergies, Psychotropic Code Status Info: Full Allergies Info: Fosamax Alendronate Sodium, Wellbutrin Bupropion Psychotropic Info: Prozac         Current Medications (06/28/2018):  This is the current hospital active medication list Current Facility-Administered Medications  Medication Dose Route Frequency Provider Last Rate Last Dose  . acetaminophen (TYLENOL) tablet 650 mg  650 mg Oral Q6H PRN Gladstone Lighter, MD       Or  . acetaminophen (TYLENOL) suppository 650 mg  650 mg Rectal Q6H PRN Gladstone Lighter, MD      . albuterol (PROVENTIL) (2.5 MG/3ML) 0.083% nebulizer solution 2.5 mg  2.5 mg Nebulization Q4H PRN Gladstone Lighter, MD      . amLODipine (NORVASC) tablet 5 mg  5 mg Oral Daily Gladstone Lighter, MD   5 mg at 06/28/18 0835  . apixaban (ELIQUIS) tablet 5 mg  5 mg Oral BID Gladstone Lighter, MD   5 mg at 06/28/18 0835  . aspirin EC tablet 81 mg  81 mg Oral Daily Gladstone Lighter, MD  81 mg at 06/28/18 0834  . atenolol (TENORMIN) tablet 25 mg  25 mg Oral BID Gladstone Lighter, MD   25 mg at 06/28/18 0837  . cefTRIAXone (ROCEPHIN) 1 g in sodium chloride 0.9 % 100 mL IVPB  1 g Intravenous Q24H Gladstone Lighter, MD 200 mL/hr at 06/28/18 0835 1 g at 06/28/18 0835  . docusate sodium (COLACE) capsule 100 mg  100 mg Oral BID Gladstone Lighter, MD   100 mg at 06/28/18 0835  . FLUoxetine (PROZAC)  capsule 20 mg  20 mg Oral Daily Gladstone Lighter, MD   20 mg at 06/28/18 0837  . furosemide (LASIX) tablet 20 mg  20 mg Oral Daily Gladstone Lighter, MD   20 mg at 06/28/18 0835  . gabapentin (NEURONTIN) capsule 300 mg  300 mg Oral BID Gladstone Lighter, MD   300 mg at 06/28/18 0836  . gabapentin (NEURONTIN) tablet 900 mg  900 mg Oral QHS Gladstone Lighter, MD   900 mg at 06/27/18 2315  . insulin aspart (novoLOG) injection 0-5 Units  0-5 Units Subcutaneous QHS Gladstone Lighter, MD   2 Units at 06/26/18 2143  . insulin aspart (novoLOG) injection 0-9 Units  0-9 Units Subcutaneous TID WC Gladstone Lighter, MD   2 Units at 06/27/18 1714  . insulin glargine (LANTUS) injection 8 Units  8 Units Subcutaneous QHS Gladstone Lighter, MD   8 Units at 06/27/18 2316  . ondansetron (ZOFRAN) tablet 4 mg  4 mg Oral Q6H PRN Gladstone Lighter, MD       Or  . ondansetron (ZOFRAN) injection 4 mg  4 mg Intravenous Q6H PRN Gladstone Lighter, MD      . polyethylene glycol (MIRALAX / GLYCOLAX) packet 17 g  17 g Oral Daily PRN Gladstone Lighter, MD      . pravastatin (PRAVACHOL) tablet 20 mg  20 mg Oral QPM Gladstone Lighter, MD   20 mg at 06/27/18 1714  . predniSONE (DELTASONE) tablet 7.5 mg  7.5 mg Oral Q breakfast Gladstone Lighter, MD   7.5 mg at 06/28/18 0836  . vitamin B-12 (CYANOCOBALAMIN) tablet 1,000 mcg  1,000 mcg Oral Daily Gladstone Lighter, MD   1,000 mcg at 06/28/18 0677     Discharge Medications: Please see discharge summary for a list of discharge medications.  Relevant Imaging Results:  Relevant Lab Results:   Additional Information [phone]  Zettie Pho, LCSW

## 2018-06-29 DIAGNOSIS — J189 Pneumonia, unspecified organism: Secondary | ICD-10-CM | POA: Diagnosis not present

## 2018-06-29 DIAGNOSIS — J9622 Acute and chronic respiratory failure with hypercapnia: Secondary | ICD-10-CM | POA: Diagnosis not present

## 2018-06-29 DIAGNOSIS — R6889 Other general symptoms and signs: Secondary | ICD-10-CM | POA: Diagnosis not present

## 2018-06-29 DIAGNOSIS — Z86718 Personal history of other venous thrombosis and embolism: Secondary | ICD-10-CM | POA: Diagnosis not present

## 2018-06-29 DIAGNOSIS — R41841 Cognitive communication deficit: Secondary | ICD-10-CM | POA: Diagnosis not present

## 2018-06-29 DIAGNOSIS — E119 Type 2 diabetes mellitus without complications: Secondary | ICD-10-CM | POA: Diagnosis not present

## 2018-06-29 DIAGNOSIS — I82503 Chronic embolism and thrombosis of unspecified deep veins of lower extremity, bilateral: Secondary | ICD-10-CM | POA: Diagnosis not present

## 2018-06-29 DIAGNOSIS — F418 Other specified anxiety disorders: Secondary | ICD-10-CM | POA: Diagnosis not present

## 2018-06-29 DIAGNOSIS — I10 Essential (primary) hypertension: Secondary | ICD-10-CM | POA: Diagnosis not present

## 2018-06-29 DIAGNOSIS — M6281 Muscle weakness (generalized): Secondary | ICD-10-CM | POA: Diagnosis not present

## 2018-06-29 DIAGNOSIS — R279 Unspecified lack of coordination: Secondary | ICD-10-CM | POA: Diagnosis not present

## 2018-06-29 DIAGNOSIS — R531 Weakness: Secondary | ICD-10-CM | POA: Diagnosis not present

## 2018-06-29 DIAGNOSIS — N390 Urinary tract infection, site not specified: Secondary | ICD-10-CM | POA: Diagnosis not present

## 2018-06-29 DIAGNOSIS — G9340 Encephalopathy, unspecified: Secondary | ICD-10-CM | POA: Diagnosis not present

## 2018-06-29 DIAGNOSIS — E1122 Type 2 diabetes mellitus with diabetic chronic kidney disease: Secondary | ICD-10-CM | POA: Diagnosis not present

## 2018-06-29 DIAGNOSIS — N3000 Acute cystitis without hematuria: Secondary | ICD-10-CM | POA: Diagnosis not present

## 2018-06-29 LAB — GLUCOSE, CAPILLARY
GLUCOSE-CAPILLARY: 235 mg/dL — AB (ref 70–99)
GLUCOSE-CAPILLARY: 155 mg/dL — AB (ref 70–99)
GLUCOSE-CAPILLARY: 226 mg/dL — AB (ref 70–99)

## 2018-06-29 IMAGING — CT CT HEAD W/O CM
3 series · 15 of 47 positions shown, 18 images · non-contrast
Comparison: Brain MRI 08/21/2013, head CT 01/13/2012.

CLINICAL DATA: 88-year-old female with altered mental status,
hypoglycemia.

EXAM:
CT HEAD WITHOUT CONTRAST
TECHNIQUE: Contiguous axial images were obtained from the base of the skull
through the vertex without intravenous contrast.

[Series 3: head wo · axial · 0.41mm/px · z∈[+512,+637]mm · 9 of 30 slices shown, 12 images]
[im 3/30  brain]
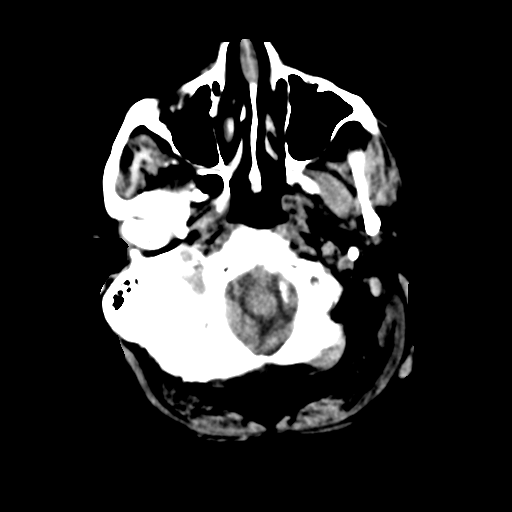
[im 3/30  bone]
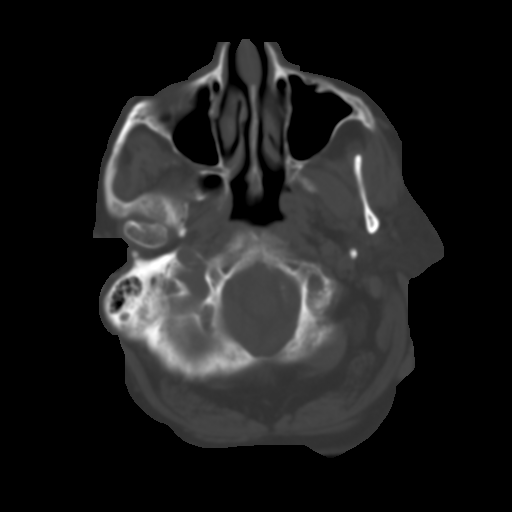
[im 6/30  brain]
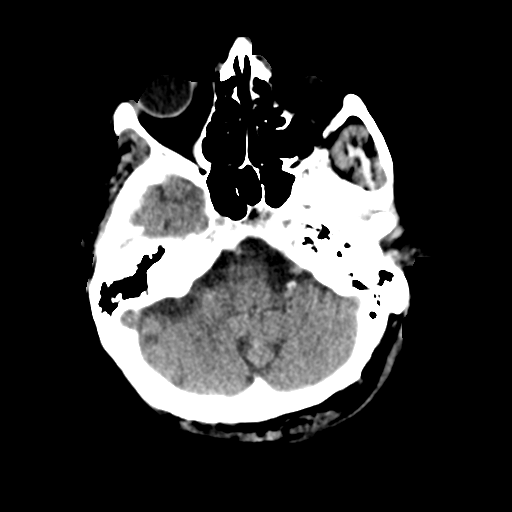
[im 9/30  brain]
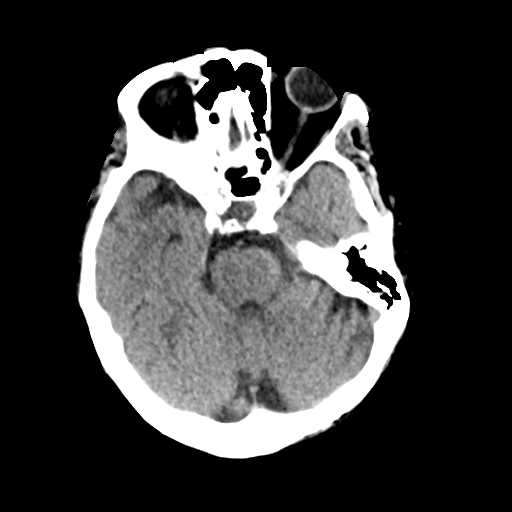
[im 12/30  brain]
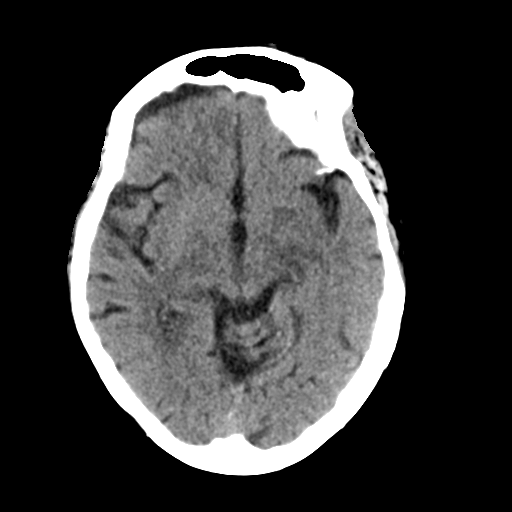
[im 16/30  brain]
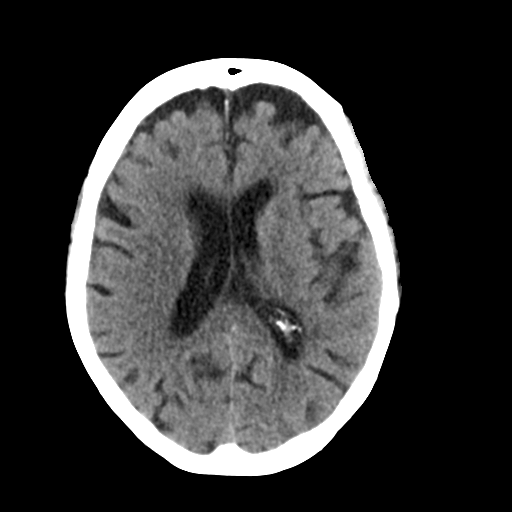
[im 16/30  bone]
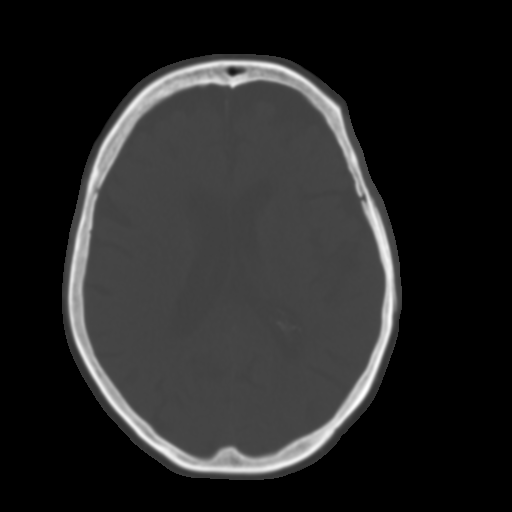
[im 19/30  brain]
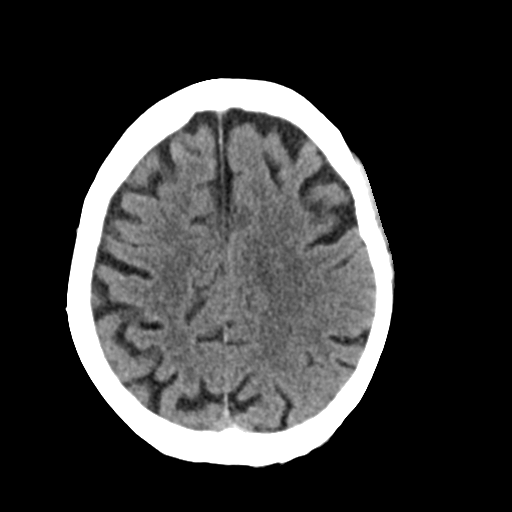
[im 22/30  brain]
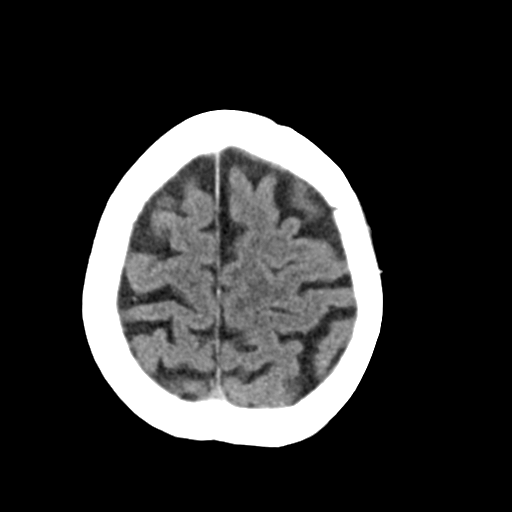
[im 25/30  brain]
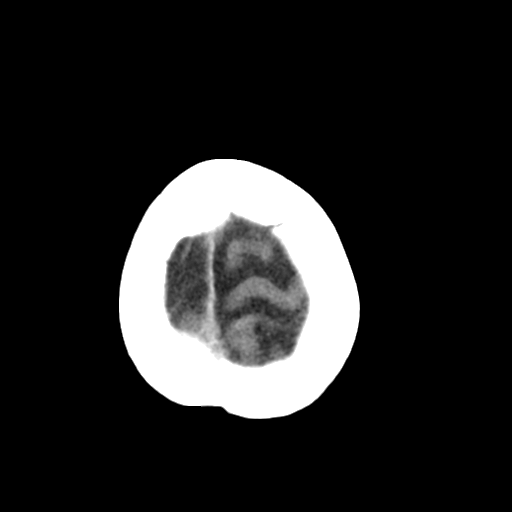
[im 28/30  brain]
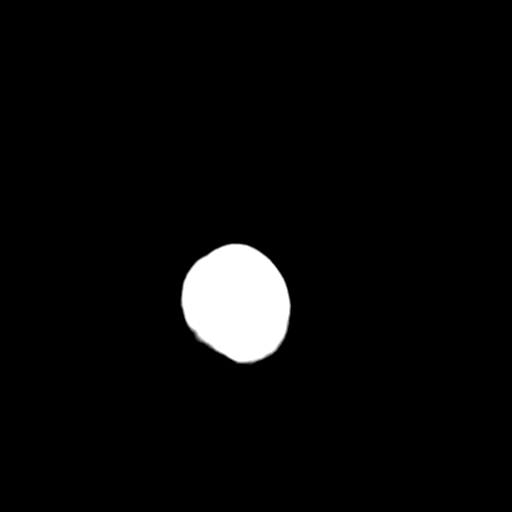
[im 28/30  bone]
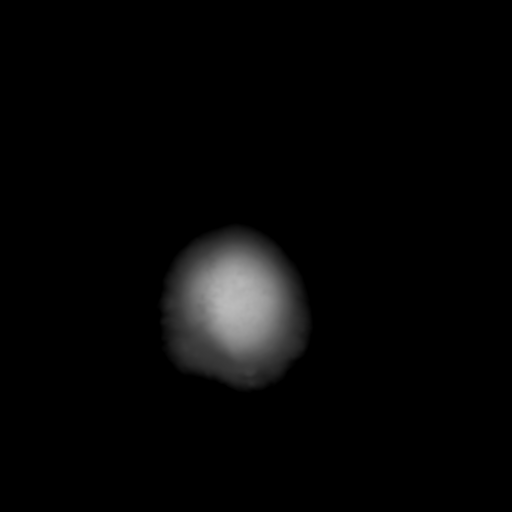

[Series 4: coronal soft tissue · coronal · 0.31mm/px · 3 of 64 slices shown]
[im 22/64  brain]
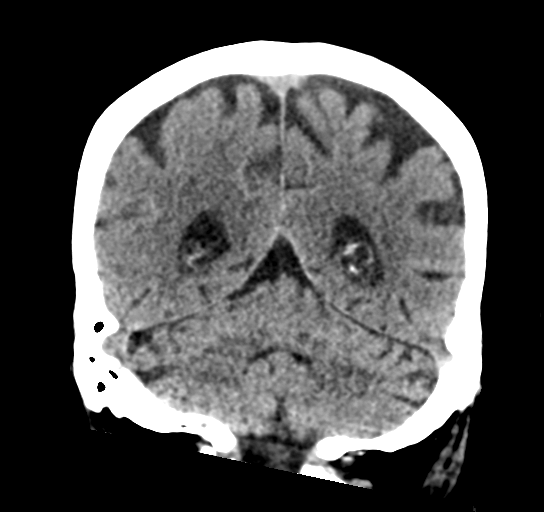
[im 29/64  brain]
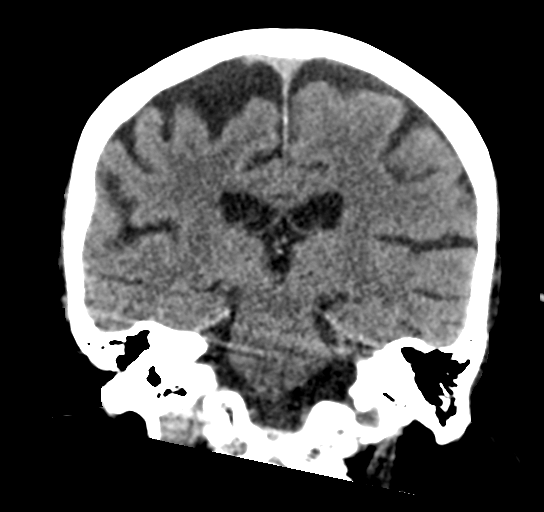
[im 36/64  brain]
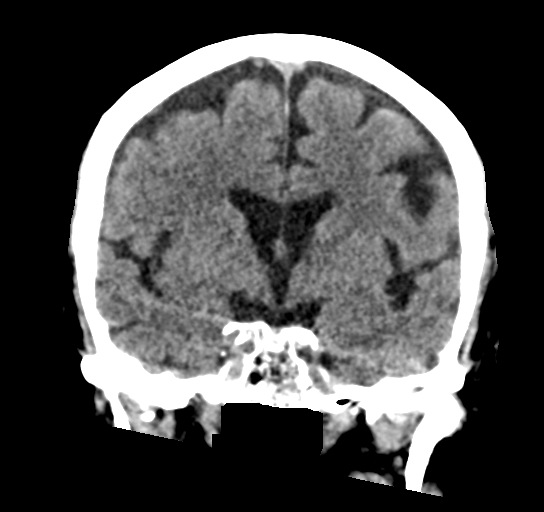

[Series 5: sagittal soft tissue · sagittal · 0.30mm/px · 3 of 51 slices shown]
[im 20/51  brain]
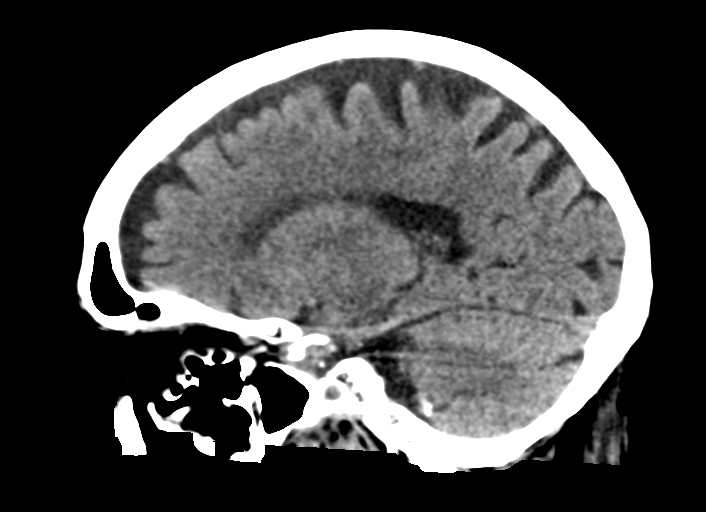
[im 26/51  brain]
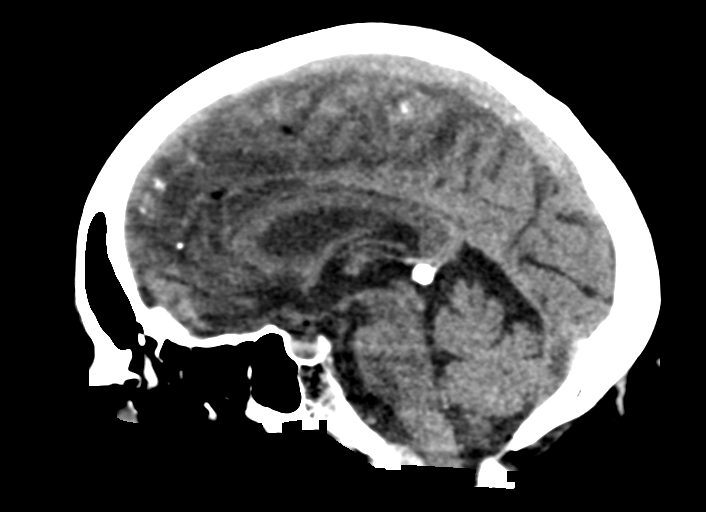
[im 31/51  brain]
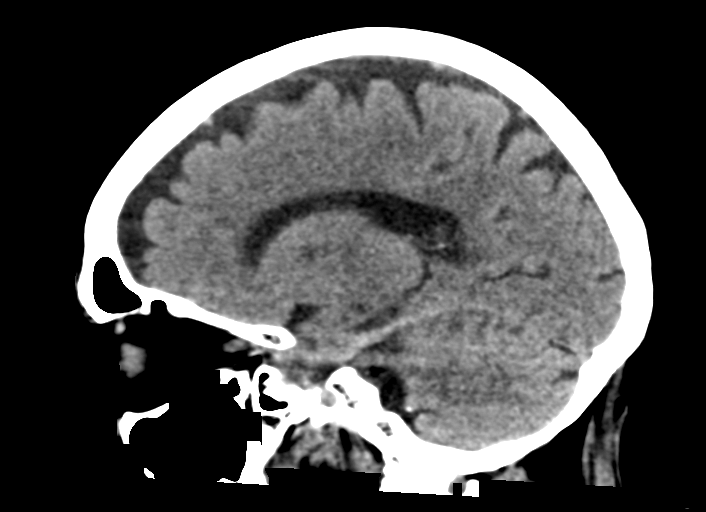

[15 of 47 positions shown; findings below may reference images not displayed]

FINDINGS: Brain: No midline shift, ventriculomegaly, mass effect, evidence of
mass lesion, intracranial hemorrhage or evidence of cortically based
acute infarction. Gray-white matter differentiation is within normal
limits for age throughout the brain.

Vascular: Calcified atherosclerosis at the skull base. No suspicious
intracranial vascular hyperdensity.

Skull: No acute osseous abnormality identified.

Sinuses/Orbits: Visualized paranasal sinuses and mastoids are stable
and well pneumatized.

Other: No acute orbit or scalp soft tissue findings.
IMPRESSION: No acute intracranial abnormality. Negative for age non contrast CT
appearance of the brain.

## 2018-06-29 MED ORDER — INSULIN ASPART 100 UNIT/ML ~~LOC~~ SOLN: 0.0000 [IU] | Freq: Three times a day (TID) | SUBCUTANEOUS | 11 refills | Status: DC

## 2018-06-29 MED ORDER — POTASSIUM CHLORIDE 20 MEQ PO PACK: 20.0000 meq | PACK | Freq: Every day | ORAL | 0 refills | Status: DC

## 2018-06-29 MED ORDER — POTASSIUM CHLORIDE CRYS ER 20 MEQ PO TBCR
40.0000 meq | EXTENDED_RELEASE_TABLET | Freq: Once | ORAL | Status: AC
  Administered 2018-06-29: 15:00:00 40 meq via ORAL
  Filled 2018-06-29: qty 2

## 2018-06-29 MED ORDER — POLYETHYLENE GLYCOL 3350 17 G PO PACK: 17.0000 g | PACK | Freq: Every day | ORAL | 0 refills | Status: DC | PRN

## 2018-06-29 MED ORDER — INSULIN GLARGINE 100 UNIT/ML ~~LOC~~ SOLN: 10.0000 [IU] | Freq: Every day | SUBCUTANEOUS | 0 refills | Status: DC

## 2018-06-29 MED ORDER — DOCUSATE SODIUM 100 MG PO CAPS: 100.0000 mg | ORAL_CAPSULE | Freq: Two times a day (BID) | ORAL | 0 refills | Status: DC

## 2018-06-29 MED ORDER — CEPHALEXIN 500 MG PO CAPS: 500.0000 mg | ORAL_CAPSULE | Freq: Three times a day (TID) | ORAL | 0 refills | Status: AC

## 2018-06-29 NOTE — Discharge Summary (Signed)
[facility] [name]    MR#:  [id number]  DATE OF BIRTH:  1928-11-28  DATE OF ADMISSION:  06/26/2018   ADMITTING PHYSICIAN: Gladstone Lighter, MD  DATE OF DISCHARGE:  06/29/18  PRIMARY CARE PHYSICIAN: [name], MD   ADMISSION DIAGNOSIS:   Confusion [R41.0] Encephalopathy [G93.40] Urinary tract infection, acute [N39.0]  DISCHARGE DIAGNOSIS:   Active Problems:   Weakness   SECONDARY DIAGNOSIS:   Past Medical History:  Diagnosis Date  . Cancer ([place])   . Cataract   . Chronic kidney disease (CKD), stage III (moderate) (HCC)   . Chronic urticaria   . COPD (chronic obstructive pulmonary disease) ([place])   . Depression   . Diabetes mellitus without complication ([place])   . DVT (deep venous thrombosis) (H[facility])    left leg  . Hyperlipemia   . Hypertension   . Osteoarthritis   . Osteopenia   . Polymyalgia rheumatica ([place])   . Polyneuropathy   . Renal cancer ([place])   . Renal insufficiency 08/10/2016   Chart indicates CKD stage 3  . Restless leg syndrome   . Vitamin D deficiency   . Vulvar intraepithelial neoplasia with lichen sclerosus     HOSPITAL COURSE:    [name] a88 y.o.femalewith a known history of COPD not on home oxygen, hypertension, osteoarthritis, neuropathy, CKD stage III, diabetes mellitus brought in from home secondary to weakness and lethargy.  1. [name] to acute cystitis -Urine cultures are growing pansensitive E.coli.   -Blood cultures are negative - on Rocephin- change to keflex at discharge -Physical therapy consulted  2. Diabetes mellitus-continue low-dose Lantus, on sliding scale insulin. -Monitor closely as previous history of hypoglycemic episodes.  3. History of DVT-on Eliquis, continue  4. Hypertension-on Norvasc, atenolol  6. Depression-Paxil.  7.  COPD- stable, stable for now - monitor, nebs - prn o2- uses nocturnal home  o2   Physical therapy recommended rehab.  Consulted Education officer, museum. discharge today    DISCHARGE CONDITIONS:   Guarded  CONSULTS OBTAINED:   None  DRUG ALLERGIES:   Allergies  Allergen Reactions  . Fosamax [Alendronate Sodium] Other (See Comments)    Reaction:  Made pt "very sick"  . Wellbutrin [Bupropion] Other (See Comments)    Reaction:  Unknown   DISCHARGE MEDICATIONS:   Allergies as of 06/29/2018      Reactions   Fosamax [alendronate Sodium] Other (See Comments)   Reaction:  Made pt "very sick"   Wellbutrin [bupropion] Other (See Comments)   Reaction:  Unknown      Medication List    STOP taking these medications   NOVOLOG FLEXPEN 100 UNIT/ML FlexPen Generic drug:  insulin aspart Replaced by:  insulin aspart 100 UNIT/ML injection     TAKE these medications   albuterol (2.5 MG/3ML) 0.083% nebulizer solution Commonly known as:  PROVENTIL Take 3 mLs (2.5 mg total) by nebulization every 4 (four) hours.   alendronate 35 MG tablet Commonly known as:  FOSAMAX Take 35 mg by mouth every 7 (seven) days. Take with a full glass of water on an empty stomach.   amLODipine 5 MG tablet Commonly known as:  NORVASC Take 1 tablet (5 mg total) by mouth daily.   apixaban 5 MG Tabs tablet Commonly known as:  ELIQUIS Take 1 tablet (5 mg total) by mouth 2 (two) times daily.   aspirin EC 81 MG tablet Take 81 mg by mouth daily.   atenolol 25 MG tablet  Commonly known as:  TENORMIN Take 25 mg by mouth 2 (two) times daily.   azelastine 0.05 % ophthalmic solution Commonly known as:  OPTIVAR Place 1 drop into both eyes 2 (two) times daily as needed for itching.   cephALEXin 500 MG capsule Commonly known as:  KEFLEX Take 1 capsule (500 mg total) by mouth 3 (three) times daily for 7 days.   diphenhydramine-acetaminophen 25-500 MG Tabs tablet Commonly known as:  TYLENOL PM Take 1 tablet by mouth at bedtime as needed (sleep or pain).   docusate sodium 100 MG  capsule Commonly known as:  COLACE Take 1 capsule (100 mg total) by mouth 2 (two) times daily.   FLUoxetine 20 MG capsule Commonly known as:  PROZAC Take 20 mg by mouth daily.   furosemide 20 MG tablet Commonly known as:  LASIX Take 20 mg by mouth daily.   gabapentin 300 MG capsule Commonly known as:  NEURONTIN Take 1 capsule (300MG ) by mouth every morning, 1 capsule (300MG ) every evening and 3 capsules (900MG ) at bedtime   GNP CAL MAG ZINC +D3 PO Take 1 tablet by mouth daily.   insulin aspart 100 UNIT/ML injection Commonly known as:  novoLOG Inject 0-9 Units into the skin 3 (three) times daily with meals. Replaces:  NOVOLOG FLEXPEN 100 UNIT/ML FlexPen   insulin glargine 100 UNIT/ML injection Commonly known as:  LANTUS Inject 0.1 mLs (10 Units total) into the skin at bedtime. What changed:  how much to take   polyethylene glycol packet Commonly known as:  MIRALAX / GLYCOLAX Take 17 g by mouth daily as needed for mild constipation.   potassium chloride 20 MEQ packet Commonly known as:  KLOR-CON Take 20 mEq by mouth daily. While on lasix   pravastatin 20 MG tablet Commonly known as:  PRAVACHOL Take 20 mg by mouth every evening.   predniSONE 5 MG tablet Commonly known as:  DELTASONE Take 7.5 mg by mouth daily.   tiotropium 18 MCG inhalation capsule Commonly known as:  SPIRIVA Place 1 capsule (18 mcg total) into inhaler and inhale daily.   vitamin B-12 1000 MCG tablet Commonly known as:  CYANOCOBALAMIN Take 1,000 mcg by mouth daily.   Vitamin D (Ergocalciferol) 50000 units Caps capsule Commonly known as:  DRISDOL Take 50,000 Units by mouth every Monday.        DISCHARGE INSTRUCTIONS:   1.  PCP follow-up in 1 to 2 weeks  DIET:   Cardiac diet  ACTIVITY:   Activity as tolerated  OXYGEN:   Home Oxygen: Yes.    Oxygen Delivery: 2 liters/min via Patient connected to nasal cannula oxygen at nights  DISCHARGE LOCATION:   nursing home   If you  experience worsening of your admission symptoms, develop shortness of breath, life threatening emergency, suicidal or homicidal thoughts you must seek medical attention immediately by calling 911 or calling your MD immediately  if symptoms less severe.  You Must read complete instructions/literature along with all the possible adverse reactions/side effects for all the Medicines you take and that have been prescribed to you. Take any new Medicines after you have completely understood and accpet all the possible adverse reactions/side effects.   Please note  You were cared for by a hospitalist during your hospital stay. If you have any questions about your discharge medications or the care you received while you were in the hospital after you are discharged, you can call the unit and asked to speak with the hospitalist on call if the  hospitalist that took care of you is not available. Once you are discharged, your primary care physician will handle any further medical issues. Please note that NO REFILLS for any discharge medications will be authorized once you are discharged, as it is imperative that you return to your primary care physician (or establish a relationship with a primary care physician if you do not have one) for your aftercare needs so that they can reassess your need for medications and monitor your lab values.    On the day of Discharge:  VITAL SIGNS:   Blood pressure 136/60, pulse 65, temperature (!) 97.5 F (36.4 C), temperature source Oral, resp. rate 16, height 5' (1.524 m), weight 90.7 kg (200 lb), SpO2 93 %.  PHYSICAL EXAMINATION:    GENERAL:84 y.o.-year-old patient lying in the bed with no acute distress.  Very pleasantly confused at times EYES: Pupils equal, round, reactive to light and accommodation. No scleral icterus. Extraocular muscles intact.  HEENT: Head atraumatic, normocephalic. Oropharynx and nasopharynx clear.  NECK: Supple, no jugular venous distention. No  thyroid enlargement, no tenderness.  LUNGS: Normal breath sounds bilaterally, no wheezing, rales,rhonchi or crepitation. No use of accessory muscles of respiration.Decreased bibasilar breath sounds CARDIOVASCULAR: S1, S2 normal. No rubs, or gallops. 3/6 systolic murmur present ABDOMEN: Soft, nontender, nondistended. Bowel sounds present. No organomegaly or mass.  EXTREMITIES: No pedal edema, cyanosis, or clubbing.  NEUROLOGIC: Cranial nerves II through XII are intact. Muscle strength 5/5 in all extremities. Sensation intact. Gait not checked.Global weakness noted. PSYCHIATRIC: The patient is alert and oriented x 3. forgetful and intermittent confusion noted SKIN: No obvious rash, lesion, or ulcer.    DATA REVIEW:   CBC Recent Labs  Lab 06/27/18 0452  WBC 7.3  HGB 9.6*  HCT 29.4*  PLT 281    Chemistries  Recent Labs  Lab 06/26/18 0710  06/28/18 0504  NA 141   < > 142  K 3.2*   < > 3.4*  CL 101   < > 103  CO2 27   < > 31  GLUCOSE 181*   < > 158*  BUN 16   < > 21  CREATININE 1.80*   < > 1.72*  CALCIUM 9.0   < > 8.4*  AST 24  --   --   ALT 12  --   --   ALKPHOS 75  --   --   BILITOT 0.8  --   --    < > = values in this interval not displayed.     Microbiology Results  Results for orders placed or performed during the hospital encounter of 06/26/18  Urine Culture     Status: Abnormal (Preliminary result)   Collection Time: 06/26/18  7:10 AM  Result Value Ref Range Status   Specimen Description   Final    URINE, CATHETERIZED Performed at [facility], [address] [place], [place] [address]    Special Requests   Final    NONE Performed at [facility], [address], [place], [place] [address]    Culture (A)  Final    >=100,000 COLONIES/mL ESCHERICHIA COLI CULTURE REINCUBATED FOR BETTER GROWTH Performed at [facility], [place] [address], [place], [place] [address]    Report Status PENDING  Incomplete   Organism ID, Bacteria  ESCHERICHIA COLI (A)  Final      Susceptibility   Escherichia coli - MIC*    AMPICILLIN 8 SENSITIVE Sensitive     CEFAZOLIN <=4  SENSITIVE Sensitive     CEFTRIAXONE <=1 SENSITIVE Sensitive     CIPROFLOXACIN <=0.25 SENSITIVE Sensitive     GENTAMICIN <=1 SENSITIVE Sensitive     IMIPENEM <=0.25 SENSITIVE Sensitive     NITROFURANTOIN <=16 SENSITIVE Sensitive     TRIMETH/SULFA <=20 SENSITIVE Sensitive     AMPICILLIN/SULBACTAM 4 SENSITIVE Sensitive     PIP/TAZO <=4 SENSITIVE Sensitive     Extended ESBL NEGATIVE Sensitive     * >=100,000 COLONIES/mL ESCHERICHIA COLI  CULTURE, BLOOD (ROUTINE X 2) w Reflex to ID Panel     Status: None (Preliminary result)   Collection Time: 06/26/18 11:48 AM  Result Value Ref Range Status   Specimen Description BLOOD BLOOD RIGHT FOREARM  Final   Special Requests   Final    BOTTLES DRAWN AEROBIC AND ANAEROBIC Blood Culture adequate volume   Culture   Final    NO GROWTH 3 DAYS Performed at [facility], [address], [place], [place] [address]    Report Status PENDING  Incomplete  CULTURE, BLOOD (ROUTINE X 2) w Reflex to ID Panel     Status: None (Preliminary result)   Collection Time: 06/26/18 12:42 PM  Result Value Ref Range Status   Specimen Description BLOOD BLOOD LEFT ARM  Final   Special Requests   Final    BOTTLES DRAWN AEROBIC AND ANAEROBIC Blood Culture adequate volume   Culture   Final    NO GROWTH 3 DAYS Performed at [facility] [address] [place], [place] [address]    Report Status PENDING  Incomplete    RADIOLOGY:  No results found.   Management plans discussed with the patient, family and they are in agreement.  CODE STATUS:     Code Status Orders  (From admission, onward)        Start     Ordered   06/26/18 1128  Full code  Continuous     06/26/18 1128    Code Status History    Date Active Date Inactive Code Status Order ID Comments User Context   04/30/2018 1446 05/03/2018 1507 Full Code  [id number]  Saundra Shelling, MD Inpatient   03/15/2018 2153 03/17/2018 1803 Full Code [id number]  Lance Coon, MD Inpatient   12/17/2016 0125 12/19/2016 1856 Full Code [id number]  Theodoro Grist, MD Inpatient   08/11/2016 0324 08/11/2016 0937 Full Code [id number]  Lance Coon, MD Inpatient   03/29/2015 2115 03/30/2015 1555 Full Code [id number]  Nicholes Mango, MD Inpatient    Advance Directive Documentation     Most Recent Value  Type of Advance Directive  Healthcare Power of Attorney  Pre-existing out of facility DNR order (yellow form or pink MOST form)  -  "MOST" Form in Place?  -      TOTAL TIME TAKING CARE OF THIS PATIENT: 38 minutes.    Niang Mitcheltree M.D on 06/29/2018 at 2:54 PM  Between 7am to 6pm - Pager - [phone]  After 6pm go to [email or web address] - [employer]wer [facility]  Office  [phone]  CC: Primary care physician; [name], MD   Note: This dictation was prepared with Dragon dictation along with smaller phrase technology. Any transcriptional errors that result from this process are unintentional.

## 2018-06-29 NOTE — Clinical Social Work Note (Signed)
CSW received phone call from [facility], stating that they have received authorization from [facility] for patient. CSW notified patient and daughter Smith Mince at bedside that patient will discharge today to [facility]. RN will call report and call for EMS.   [place], [place]

## 2018-06-29 NOTE — Care Management (Signed)
Notified [facility] of discharge to skilled nursing facility

## 2018-06-29 NOTE — Progress Notes (Signed)
[facility] [name]    MR#:  [id number]  DATE OF BIRTH:  [birth year]  SUBJECTIVE:  CHIEF COMPLAINT:   Chief Complaint  Patient presents with  . Fall  . Weakness   -Feeling better.  Physical therapy recommended rehab -Urine cultures are growing E.coli  REVIEW OF SYSTEMS:  Review of Systems  Constitutional: Positive for malaise/fatigue. Negative for chills and fever.  HENT: Positive for hearing loss.   Eyes: Negative for blurred vision and double vision.  Respiratory: Negative for cough, shortness of breath and wheezing.   Cardiovascular: Negative for chest pain and palpitations.  Gastrointestinal: Negative for abdominal pain, constipation, diarrhea, nausea and vomiting.  Genitourinary: Negative for dysuria.  Musculoskeletal: Negative for myalgias.  Neurological: Positive for weakness. Negative for dizziness, speech change, focal weakness, seizures and headaches.    DRUG ALLERGIES:   Allergies  Allergen Reactions  . Fosamax [Alendronate Sodium] Other (See Comments)    Reaction:  Made pt "very sick"  . Wellbutrin [Bupropion] Other (See Comments)    Reaction:  Unknown    VITALS:  Blood pressure 136/60, pulse 65, temperature (!) 97.5 F (36.4 C), temperature source Oral, resp. rate 16, height 5' (1.524 m), weight 90.7 kg (200 lb), SpO2 93 %.  PHYSICAL EXAMINATION:  Physical Exam  GENERAL:  84 y.o.-year-old patient lying in the bed with no acute distress.  EYES: Pupils equal, round, reactive to light and accommodation. No scleral icterus. Extraocular muscles intact.  HEENT: Head atraumatic, normocephalic. Oropharynx and nasopharynx clear.  NECK:  Supple, no jugular venous distention. No thyroid enlargement, no tenderness.  LUNGS: Normal breath sounds bilaterally, no wheezing, rales,rhonchi or crepitation. No use of accessory muscles of respiration. Decreased bibasilar breath sounds CARDIOVASCULAR: S1, S2  normal. No  rubs, or gallops. 3/6 systolic murmur present ABDOMEN: Soft, nontender, nondistended. Bowel sounds present. No organomegaly or mass.  EXTREMITIES: No pedal edema, cyanosis, or clubbing.  NEUROLOGIC: Cranial nerves II through XII are intact. Muscle strength 5/5 in all extremities. Sensation intact. Gait not checked. Global weakness noted. PSYCHIATRIC: The patient is alert and oriented x 3. forgetful and intermittent confusion noted SKIN: No obvious rash, lesion, or ulcer.     LABORATORY PANEL:   CBC Recent Labs  Lab 06/27/18 0452  WBC 7.3  HGB 9.6*  HCT 29.4*  PLT 281   ------------------------------------------------------------------------------------------------------------------  Chemistries  Recent Labs  Lab 06/26/18 0710  06/28/18 0504  NA 141   < > 142  K 3.2*   < > 3.4*  CL 101   < > 103  CO2 27   < > 31  GLUCOSE 181*   < > 158*  BUN 16   < > 21  CREATININE 1.80*   < > 1.72*  CALCIUM 9.0   < > 8.4*  AST 24  --   --   ALT 12  --   --   ALKPHOS 75  --   --   BILITOT 0.8  --   --    < > = values in this interval not displayed.   ------------------------------------------------------------------------------------------------------------------  Cardiac Enzymes Recent Labs  Lab 06/26/18 0710  TROPONINI 0.03*   ------------------------------------------------------------------------------------------------------------------  RADIOLOGY:  No results found.  EKG:   Orders placed or performed during the hospital encounter of 06/26/18  . EKG 12-Lead  . EKG 12-Lead  . ED EKG  . ED EKG  . EKG 12-Lead  . EKG 12-Lead  . EKG 12-Lead  .  EKG 12-Lead  . EKG    ASSESSMENT AND PLAN:   Tracy Mcmahon  is a 84 y.o. female with a known history of COPD not on home oxygen, hypertension, osteoarthritis, neuropathy, CKD stage III, diabetes mellitus brought in from home secondary to weakness and lethargy.  1.  Tracy Mcmahon to acute cystitis -Urine  cultures are growing pansensitive E.coli.   -Blood cultures are negative - on Rocephin- change to keflex at discharge -Physical therapy consulted  2.  Diabetes mellitus-continue low-dose Lantus, on sliding scale insulin. -Monitor closely as previous history of hypoglycemic episodes.  3.  History of DVT-on Eliquis, continue  4.  Hypertension-on Norvasc, atenolol  6.  Depression-Paxil  7.  DVT prophylaxis-already on Eliquis.  8.  COPD- stable, stable for now - monitor, nebs - prn o2- uses nocturnal home o2   Physical therapy recommended rehab.  Consulted Education officer, museum. discharge today   All the records are reviewed and case discussed with Care Management/Social Workerr. Management plans discussed with the patient, family and they are in agreement.  CODE STATUS: Full Code  TOTAL TIME TAKING CARE OF THIS PATIENT: 37 minutes.   POSSIBLE D/C TODAY, DEPENDING ON CLINICAL CONDITION.   Tracy Mcmahon M.D on 06/29/2018 at 2:47 PM  Between 7am to 6pm - Pager - [phone]  After 6pm go to [email or web address] - password [email or web address] [facility]  Office  [phone]  CC: Primary care physician; [name], MD

## 2018-06-29 NOTE — Care Management Important Message (Signed)
Important Message  Patient Details  [name] MRN: [id number] Date of Birth: [birth year]   Medicare Important Message Given:  Yes    [name] A [name] 06/29/2018, 10:55 AM

## 2018-06-29 NOTE — Progress Notes (Signed)
Physical Therapy Treatment Patient Details [name] MRN: [id number] DOB: 1929/04/21 Today's Date: 06/29/2018    History of Present Illness 84 yo female with onset of generalized weakness and fall at home was admitted with history of recent UTI and current elevated troponin.  Has some possible confusion and difficulty walking, using RW but this is PLOF.  PMHx:  PN, CKD 3, DM, CA, OA, DVT, polymyalgia rheumatica, COPD    PT Comments    Pt demonstrating large improvement this date, moving very well. Able to perform bed mobility and transfers without physical assistance, however AMB is still limited to less than 18ft per bout. Pt progressing well in mentation also, noted safer/more appropriate use of RW without need for physical assist or verbal cues. Pt progressing well. Will continue to follow acutely.     Follow Up Recommendations  SNF     Equipment Recommendations  (to be determined. )    Recommendations for Other Services       Precautions / Restrictions Precautions Precautions: Fall Restrictions Weight Bearing Restrictions: No    Mobility  Bed Mobility Overal bed mobility: Needs Assistance Bed Mobility: Supine to Sit     Supine to sit: Supervision     General bed mobility comments: heavy effort required, but no physical assist needed.   Transfers   Equipment used: Rolling walker (2 wheeled);1 person hand held assist Transfers: Sit to/from Stand Sit to Stand: Modified independent (Device/Increase time)         General transfer comment: twice duing functional mobility, then 1x3 for exercise; Safe form and well established technique noted. Needs assist maintaining diaper in place.   Ambulation/Gait   Gait Distance (Feet): 55 Feet(25ft, 3 minute seated rest, then 55 ft. ) Assistive device: Rolling walker (2 wheeled) Gait Pattern/deviations: Step-to pattern;Step-through pattern;Decreased stride length;Wide base of support         Stairs              Wheelchair Mobility    Modified Rankin (Stroke Patients Only)       Balance Overall balance assessment: No apparent balance deficits (not formally assessed)                                          Cognition Arousal/Alertness: Awake/alert Behavior During Therapy: WFL for tasks assessed/performed Overall Cognitive Status: Within Functional Limits for tasks assessed                                        Exercises      General Comments        Pertinent Vitals/Pain Pain Assessment: No/denies pain    Home Living                      Prior Function            PT Goals (current goals can now be found in the care plan section) Acute Rehab PT Goals Patient Stated Goal: to walk and progress with gait PT Goal Formulation: With patient/family Time For Goal Achievement: 07/10/18 Potential to Achieve Goals: Good Progress towards PT goals: Progressing toward goals    Frequency    Min 2X/week      PT Plan Current plan remains appropriate    Co-evaluation  AM-PAC PT "6 Clicks" Daily Activity  Outcome Measure  Difficulty turning over in bed (including adjusting bedclothes, sheets and blankets)?: A Lot Difficulty moving from lying on back to sitting on the side of the bed? : A Lot Difficulty sitting down on and standing up from a chair with arms (e.g., wheelchair, bedside commode, etc,.)?: A Lot Help needed moving to and from a bed to chair (including a wheelchair)?: A Little Help needed walking in hospital room?: A Little Help needed climbing 3-5 steps with a railing? : A Lot 6 Click Score: 14    End of Session Equipment Utilized During Treatment: Gait belt Activity Tolerance: Patient tolerated treatment well;Patient limited by fatigue Patient left: in chair;with call bell/phone within reach;with family/visitor present;with nursing/sitter in room Nurse Communication: Mobility status PT Visit  Diagnosis: Unsteadiness on feet (R26.81);Muscle weakness (generalized) (M62.81);Adult, failure to thrive (R62.7);Difficulty in walking, not elsewhere classified (R26.2);History of falling (Z91.81)     Time: 1130-1147 PT Time Calculation (min) (ACUTE ONLY): 17 min  Charges:  $Therapeutic Activity: 8-22 mins                     12:20 PM, 06/29/18 Etta Grandchild, PT, DPT Physical Therapist - [facility]  [phone] ([place])     [place] C 06/29/2018, 12:11 PM

## 2018-06-29 NOTE — Progress Notes (Signed)
Inpatient Diabetes Program Recommendations  AACE/ADA: New Consensus Statement on Inpatient Glycemic Control (2019)  Target Ranges:  Prepandial:   less than 140 mg/dL      Peak postprandial:   less than 180 mg/dL (1-2 hours)      Critically ill patients:  140 - 180 mg/dL   Results for Tracy Mcmahon, Tracy Mcmahon (MRN [id number]) as of 06/29/2018 11:38  Ref. Range 06/28/2018 07:36 06/28/2018 12:05 06/28/2018 17:30 06/28/2018 20:58 06/29/2018 08:42  Glucose-Capillary Latest Ref Range: 70 - 99 mg/dL 117 (H) 211 (H) 222 (H) 180 (H) 235 (H)  Results for Tracy Mcmahon, Tracy Mcmahon (MRN [id number]) as of 06/29/2018 11:38  Ref. Range 06/27/2018 07:37 06/27/2018 11:47 06/27/2018 16:46 06/27/2018 20:16 06/27/2018 23:10  Glucose-Capillary Latest Ref Range: 70 - 99 mg/dL 129 (H) 194 (H) 197 (H) 213 (H) 187 (H)   Review of Glycemic Control  Diabetes history: DM2 Outpatient Diabetes medications: Lantus 100 units daily (given in two injections of 50 units each for total of 100 units), Novolog 10 units BID with meals Current orders for Inpatient glycemic control: Lantus 10 units QHS, Novolog 0-9 units TID with meals, Novolog 0-5 units QHS; Prednisone 7.5 mg QAM  Inpatient Diabetes Program Recommendations: Insulin - Basal: Noted Lantus was increased from 8 to 10 units QHS on 06/28/18.  Insulin-Meal coverage: Please consider ordering Novolog 3 units TID with meals for meal coverage if patient eats at least 50% of meals.  Outpatient DM medications: Recommend MD re-evaluate outpatient DM medications at time of discharge and discharge on a similar regimen as being used while inpatient.   NOTE: In reviewing the chart, noted patient was an inpatient from 04/30/18 to 05/03/18 and was noted to have hypoglycemia. Per chart, patient was taking Lantus 100 units daily and NOvolog 20 units BID with meals prior to last hospitalization. Patient was discharged on 05/03/18 and instructed to decrease Lantus to 8 units QHS and to discontinue Novolog insulin.   Patient followed  up with Dr. Raechel Ache on 05/14/18 and per office note, patient's daughter reported glucose was consistently in the 200's mg/dl at home with highest glucose 259 mg/dl. Per office note by Dr. Raechel Ache on 05/14/18 he noted "Poorly controlled by 02/12/2018 hemoglobin A1c of 9.6%, and control sounds worse since return from [facility]. Suspect that discharge instructions to take 8 units of Lantus were incorrect by a factor of 10. Advised increasing Lantus back to 80 units once daily, and if her appetite is normal, going back to 20 units of NovoLog with each meal. Advised that she not be given NovoLog unless she is eating a good meal, and not be given NovoLog if she is sick and oral intake decreases. Follow up with Gaetano Net as scheduled 09/08/2018."   Thanks, Barnie Alderman, RN, MSN, CDE Diabetes Coordinator Inpatient Diabetes Program [phone] (Team Pager from 8am to 5pm)

## 2018-06-29 NOTE — Clinical Social Work Note (Signed)
CSW spoke with patient's daughter Smith Mince [phone] to give bed offers. Daughter chose bed at [facility]. CSW notified Rick at [facility] of bed acceptance. Liliane Channel will begin [facility] authorization. CSW will continue to follow for discharge planning.   [place], [place]

## 2018-06-30 LAB — URINE CULTURE

## 2018-07-01 DIAGNOSIS — E119 Type 2 diabetes mellitus without complications: Secondary | ICD-10-CM | POA: Diagnosis not present

## 2018-07-01 DIAGNOSIS — N390 Urinary tract infection, site not specified: Secondary | ICD-10-CM | POA: Diagnosis not present

## 2018-07-01 DIAGNOSIS — Z86718 Personal history of other venous thrombosis and embolism: Secondary | ICD-10-CM | POA: Diagnosis not present

## 2018-07-01 DIAGNOSIS — I10 Essential (primary) hypertension: Secondary | ICD-10-CM | POA: Diagnosis not present

## 2018-07-01 DIAGNOSIS — F418 Other specified anxiety disorders: Secondary | ICD-10-CM | POA: Diagnosis not present

## 2018-07-01 LAB — CULTURE, BLOOD (ROUTINE X 2)
Culture: NO GROWTH
Culture: NO GROWTH
SPECIAL REQUESTS: ADEQUATE
Special Requests: ADEQUATE

## 2018-07-08 DIAGNOSIS — I10 Essential (primary) hypertension: Secondary | ICD-10-CM | POA: Diagnosis not present

## 2018-07-08 DIAGNOSIS — N390 Urinary tract infection, site not specified: Secondary | ICD-10-CM | POA: Diagnosis not present

## 2018-07-08 DIAGNOSIS — Z86718 Personal history of other venous thrombosis and embolism: Secondary | ICD-10-CM | POA: Diagnosis not present

## 2018-07-08 DIAGNOSIS — F418 Other specified anxiety disorders: Secondary | ICD-10-CM | POA: Diagnosis not present

## 2018-07-08 DIAGNOSIS — E119 Type 2 diabetes mellitus without complications: Secondary | ICD-10-CM | POA: Diagnosis not present

## 2018-07-13 DIAGNOSIS — E785 Hyperlipidemia, unspecified: Secondary | ICD-10-CM | POA: Diagnosis not present

## 2018-07-13 DIAGNOSIS — B9620 Unspecified Escherichia coli [E. coli] as the cause of diseases classified elsewhere: Secondary | ICD-10-CM | POA: Diagnosis not present

## 2018-07-13 DIAGNOSIS — N390 Urinary tract infection, site not specified: Secondary | ICD-10-CM | POA: Diagnosis not present

## 2018-07-13 DIAGNOSIS — M159 Polyosteoarthritis, unspecified: Secondary | ICD-10-CM | POA: Diagnosis not present

## 2018-07-13 DIAGNOSIS — C649 Malignant neoplasm of unspecified kidney, except renal pelvis: Secondary | ICD-10-CM | POA: Diagnosis not present

## 2018-07-13 DIAGNOSIS — N183 Chronic kidney disease, stage 3 (moderate): Secondary | ICD-10-CM | POA: Diagnosis not present

## 2018-07-13 DIAGNOSIS — J449 Chronic obstructive pulmonary disease, unspecified: Secondary | ICD-10-CM | POA: Diagnosis not present

## 2018-07-13 DIAGNOSIS — E1122 Type 2 diabetes mellitus with diabetic chronic kidney disease: Secondary | ICD-10-CM | POA: Diagnosis not present

## 2018-07-13 DIAGNOSIS — R531 Weakness: Secondary | ICD-10-CM | POA: Diagnosis not present

## 2018-07-16 DIAGNOSIS — E785 Hyperlipidemia, unspecified: Secondary | ICD-10-CM | POA: Diagnosis not present

## 2018-07-16 DIAGNOSIS — N183 Chronic kidney disease, stage 3 (moderate): Secondary | ICD-10-CM | POA: Diagnosis not present

## 2018-07-16 DIAGNOSIS — J449 Chronic obstructive pulmonary disease, unspecified: Secondary | ICD-10-CM | POA: Diagnosis not present

## 2018-07-16 DIAGNOSIS — M159 Polyosteoarthritis, unspecified: Secondary | ICD-10-CM | POA: Diagnosis not present

## 2018-07-16 DIAGNOSIS — N390 Urinary tract infection, site not specified: Secondary | ICD-10-CM | POA: Diagnosis not present

## 2018-07-16 DIAGNOSIS — E1122 Type 2 diabetes mellitus with diabetic chronic kidney disease: Secondary | ICD-10-CM | POA: Diagnosis not present

## 2018-07-16 DIAGNOSIS — C649 Malignant neoplasm of unspecified kidney, except renal pelvis: Secondary | ICD-10-CM | POA: Diagnosis not present

## 2018-07-16 DIAGNOSIS — B9620 Unspecified Escherichia coli [E. coli] as the cause of diseases classified elsewhere: Secondary | ICD-10-CM | POA: Diagnosis not present

## 2018-07-16 DIAGNOSIS — R531 Weakness: Secondary | ICD-10-CM | POA: Diagnosis not present

## 2018-07-17 DIAGNOSIS — M159 Polyosteoarthritis, unspecified: Secondary | ICD-10-CM | POA: Diagnosis not present

## 2018-07-17 DIAGNOSIS — B9620 Unspecified Escherichia coli [E. coli] as the cause of diseases classified elsewhere: Secondary | ICD-10-CM | POA: Diagnosis not present

## 2018-07-17 DIAGNOSIS — E785 Hyperlipidemia, unspecified: Secondary | ICD-10-CM | POA: Diagnosis not present

## 2018-07-17 DIAGNOSIS — C649 Malignant neoplasm of unspecified kidney, except renal pelvis: Secondary | ICD-10-CM | POA: Diagnosis not present

## 2018-07-17 DIAGNOSIS — R531 Weakness: Secondary | ICD-10-CM | POA: Diagnosis not present

## 2018-07-17 DIAGNOSIS — E1122 Type 2 diabetes mellitus with diabetic chronic kidney disease: Secondary | ICD-10-CM | POA: Diagnosis not present

## 2018-07-17 DIAGNOSIS — N390 Urinary tract infection, site not specified: Secondary | ICD-10-CM | POA: Diagnosis not present

## 2018-07-17 DIAGNOSIS — N183 Chronic kidney disease, stage 3 (moderate): Secondary | ICD-10-CM | POA: Diagnosis not present

## 2018-07-17 DIAGNOSIS — J449 Chronic obstructive pulmonary disease, unspecified: Secondary | ICD-10-CM | POA: Diagnosis not present

## 2018-07-22 DIAGNOSIS — N183 Chronic kidney disease, stage 3 (moderate): Secondary | ICD-10-CM | POA: Diagnosis not present

## 2018-07-22 DIAGNOSIS — N390 Urinary tract infection, site not specified: Secondary | ICD-10-CM | POA: Diagnosis not present

## 2018-07-22 DIAGNOSIS — J449 Chronic obstructive pulmonary disease, unspecified: Secondary | ICD-10-CM | POA: Diagnosis not present

## 2018-07-22 DIAGNOSIS — M159 Polyosteoarthritis, unspecified: Secondary | ICD-10-CM | POA: Diagnosis not present

## 2018-07-22 DIAGNOSIS — B9620 Unspecified Escherichia coli [E. coli] as the cause of diseases classified elsewhere: Secondary | ICD-10-CM | POA: Diagnosis not present

## 2018-07-22 DIAGNOSIS — E785 Hyperlipidemia, unspecified: Secondary | ICD-10-CM | POA: Diagnosis not present

## 2018-07-22 DIAGNOSIS — R531 Weakness: Secondary | ICD-10-CM | POA: Diagnosis not present

## 2018-07-22 DIAGNOSIS — E1122 Type 2 diabetes mellitus with diabetic chronic kidney disease: Secondary | ICD-10-CM | POA: Diagnosis not present

## 2018-07-22 DIAGNOSIS — C649 Malignant neoplasm of unspecified kidney, except renal pelvis: Secondary | ICD-10-CM | POA: Diagnosis not present

## 2018-07-24 DIAGNOSIS — E785 Hyperlipidemia, unspecified: Secondary | ICD-10-CM | POA: Diagnosis not present

## 2018-07-24 DIAGNOSIS — J449 Chronic obstructive pulmonary disease, unspecified: Secondary | ICD-10-CM | POA: Diagnosis not present

## 2018-07-24 DIAGNOSIS — E1122 Type 2 diabetes mellitus with diabetic chronic kidney disease: Secondary | ICD-10-CM | POA: Diagnosis not present

## 2018-07-24 DIAGNOSIS — R531 Weakness: Secondary | ICD-10-CM | POA: Diagnosis not present

## 2018-07-24 DIAGNOSIS — M159 Polyosteoarthritis, unspecified: Secondary | ICD-10-CM | POA: Diagnosis not present

## 2018-07-24 DIAGNOSIS — C649 Malignant neoplasm of unspecified kidney, except renal pelvis: Secondary | ICD-10-CM | POA: Diagnosis not present

## 2018-07-24 DIAGNOSIS — B9620 Unspecified Escherichia coli [E. coli] as the cause of diseases classified elsewhere: Secondary | ICD-10-CM | POA: Diagnosis not present

## 2018-07-24 DIAGNOSIS — N183 Chronic kidney disease, stage 3 (moderate): Secondary | ICD-10-CM | POA: Diagnosis not present

## 2018-07-24 DIAGNOSIS — N390 Urinary tract infection, site not specified: Secondary | ICD-10-CM | POA: Diagnosis not present

## 2018-07-29 DIAGNOSIS — J449 Chronic obstructive pulmonary disease, unspecified: Secondary | ICD-10-CM | POA: Diagnosis not present

## 2018-07-29 DIAGNOSIS — N390 Urinary tract infection, site not specified: Secondary | ICD-10-CM | POA: Diagnosis not present

## 2018-07-29 DIAGNOSIS — E785 Hyperlipidemia, unspecified: Secondary | ICD-10-CM | POA: Diagnosis not present

## 2018-07-29 DIAGNOSIS — N183 Chronic kidney disease, stage 3 (moderate): Secondary | ICD-10-CM | POA: Diagnosis not present

## 2018-07-29 DIAGNOSIS — M159 Polyosteoarthritis, unspecified: Secondary | ICD-10-CM | POA: Diagnosis not present

## 2018-07-29 DIAGNOSIS — B9620 Unspecified Escherichia coli [E. coli] as the cause of diseases classified elsewhere: Secondary | ICD-10-CM | POA: Diagnosis not present

## 2018-07-29 DIAGNOSIS — C649 Malignant neoplasm of unspecified kidney, except renal pelvis: Secondary | ICD-10-CM | POA: Diagnosis not present

## 2018-07-29 DIAGNOSIS — E1122 Type 2 diabetes mellitus with diabetic chronic kidney disease: Secondary | ICD-10-CM | POA: Diagnosis not present

## 2018-07-29 DIAGNOSIS — R531 Weakness: Secondary | ICD-10-CM | POA: Diagnosis not present

## 2018-08-04 DIAGNOSIS — E785 Hyperlipidemia, unspecified: Secondary | ICD-10-CM | POA: Diagnosis not present

## 2018-08-04 DIAGNOSIS — E559 Vitamin D deficiency, unspecified: Secondary | ICD-10-CM | POA: Diagnosis not present

## 2018-08-04 DIAGNOSIS — C649 Malignant neoplasm of unspecified kidney, except renal pelvis: Secondary | ICD-10-CM | POA: Diagnosis not present

## 2018-08-04 DIAGNOSIS — N183 Chronic kidney disease, stage 3 (moderate): Secondary | ICD-10-CM | POA: Diagnosis not present

## 2018-08-04 DIAGNOSIS — N390 Urinary tract infection, site not specified: Secondary | ICD-10-CM | POA: Diagnosis not present

## 2018-08-04 DIAGNOSIS — J449 Chronic obstructive pulmonary disease, unspecified: Secondary | ICD-10-CM | POA: Diagnosis not present

## 2018-08-04 DIAGNOSIS — E1122 Type 2 diabetes mellitus with diabetic chronic kidney disease: Secondary | ICD-10-CM | POA: Diagnosis not present

## 2018-08-04 DIAGNOSIS — R531 Weakness: Secondary | ICD-10-CM | POA: Diagnosis not present

## 2018-08-04 DIAGNOSIS — M159 Polyosteoarthritis, unspecified: Secondary | ICD-10-CM | POA: Diagnosis not present

## 2018-08-07 DIAGNOSIS — E785 Hyperlipidemia, unspecified: Secondary | ICD-10-CM | POA: Diagnosis not present

## 2018-08-07 DIAGNOSIS — J449 Chronic obstructive pulmonary disease, unspecified: Secondary | ICD-10-CM | POA: Diagnosis not present

## 2018-08-07 DIAGNOSIS — N183 Chronic kidney disease, stage 3 (moderate): Secondary | ICD-10-CM | POA: Diagnosis not present

## 2018-08-07 DIAGNOSIS — N390 Urinary tract infection, site not specified: Secondary | ICD-10-CM | POA: Diagnosis not present

## 2018-08-07 DIAGNOSIS — M159 Polyosteoarthritis, unspecified: Secondary | ICD-10-CM | POA: Diagnosis not present

## 2018-08-07 DIAGNOSIS — E1122 Type 2 diabetes mellitus with diabetic chronic kidney disease: Secondary | ICD-10-CM | POA: Diagnosis not present

## 2018-08-07 DIAGNOSIS — C649 Malignant neoplasm of unspecified kidney, except renal pelvis: Secondary | ICD-10-CM | POA: Diagnosis not present

## 2018-08-07 DIAGNOSIS — R531 Weakness: Secondary | ICD-10-CM | POA: Diagnosis not present

## 2018-08-07 DIAGNOSIS — B9620 Unspecified Escherichia coli [E. coli] as the cause of diseases classified elsewhere: Secondary | ICD-10-CM | POA: Diagnosis not present

## 2018-08-11 DIAGNOSIS — E1122 Type 2 diabetes mellitus with diabetic chronic kidney disease: Secondary | ICD-10-CM | POA: Diagnosis not present

## 2018-08-11 DIAGNOSIS — Z794 Long term (current) use of insulin: Secondary | ICD-10-CM | POA: Diagnosis not present

## 2018-08-11 DIAGNOSIS — N390 Urinary tract infection, site not specified: Secondary | ICD-10-CM | POA: Diagnosis not present

## 2018-08-11 DIAGNOSIS — E1169 Type 2 diabetes mellitus with other specified complication: Secondary | ICD-10-CM | POA: Diagnosis not present

## 2018-08-11 DIAGNOSIS — E559 Vitamin D deficiency, unspecified: Secondary | ICD-10-CM | POA: Diagnosis not present

## 2018-08-11 DIAGNOSIS — D631 Anemia in chronic kidney disease: Secondary | ICD-10-CM | POA: Diagnosis not present

## 2018-08-11 DIAGNOSIS — Z79899 Other long term (current) drug therapy: Secondary | ICD-10-CM | POA: Diagnosis not present

## 2018-08-11 DIAGNOSIS — Z86711 Personal history of pulmonary embolism: Secondary | ICD-10-CM | POA: Diagnosis not present

## 2018-08-11 DIAGNOSIS — N183 Chronic kidney disease, stage 3 (moderate): Secondary | ICD-10-CM | POA: Diagnosis not present

## 2018-08-11 DIAGNOSIS — J439 Emphysema, unspecified: Secondary | ICD-10-CM | POA: Diagnosis not present

## 2018-08-11 DIAGNOSIS — E1159 Type 2 diabetes mellitus with other circulatory complications: Secondary | ICD-10-CM | POA: Diagnosis not present

## 2018-08-14 ENCOUNTER — Encounter: Payer: Self-pay | Admitting: Emergency Medicine

## 2018-08-14 ENCOUNTER — Observation Stay
Admission: EM | Admit: 2018-08-14 | Discharge: 2018-08-16 | Disposition: A | Discharge status: Home Health | Payer: Medicare HMO | Attending: Internal Medicine | Admitting: Internal Medicine

## 2018-08-14 ENCOUNTER — Emergency Department: Payer: Medicare HMO

## 2018-08-14 ENCOUNTER — Other Ambulatory Visit: Payer: Self-pay

## 2018-08-14 DIAGNOSIS — J449 Chronic obstructive pulmonary disease, unspecified: Secondary | ICD-10-CM | POA: Diagnosis not present

## 2018-08-14 DIAGNOSIS — M1990 Unspecified osteoarthritis, unspecified site: Secondary | ICD-10-CM | POA: Insufficient documentation

## 2018-08-14 DIAGNOSIS — E1122 Type 2 diabetes mellitus with diabetic chronic kidney disease: Secondary | ICD-10-CM | POA: Diagnosis not present

## 2018-08-14 DIAGNOSIS — G9340 Encephalopathy, unspecified: Secondary | ICD-10-CM | POA: Insufficient documentation

## 2018-08-14 DIAGNOSIS — Z8249 Family history of ischemic heart disease and other diseases of the circulatory system: Secondary | ICD-10-CM | POA: Insufficient documentation

## 2018-08-14 DIAGNOSIS — I10 Essential (primary) hypertension: Secondary | ICD-10-CM | POA: Diagnosis not present

## 2018-08-14 DIAGNOSIS — E1140 Type 2 diabetes mellitus with diabetic neuropathy, unspecified: Secondary | ICD-10-CM | POA: Diagnosis not present

## 2018-08-14 DIAGNOSIS — Z79899 Other long term (current) drug therapy: Secondary | ICD-10-CM | POA: Insufficient documentation

## 2018-08-14 DIAGNOSIS — I700 Atherosclerosis of aorta: Secondary | ICD-10-CM | POA: Insufficient documentation

## 2018-08-14 DIAGNOSIS — I252 Old myocardial infarction: Secondary | ICD-10-CM | POA: Diagnosis not present

## 2018-08-14 DIAGNOSIS — E559 Vitamin D deficiency, unspecified: Secondary | ICD-10-CM | POA: Insufficient documentation

## 2018-08-14 DIAGNOSIS — N3000 Acute cystitis without hematuria: Secondary | ICD-10-CM | POA: Insufficient documentation

## 2018-08-14 DIAGNOSIS — Z905 Acquired absence of kidney: Secondary | ICD-10-CM | POA: Insufficient documentation

## 2018-08-14 DIAGNOSIS — N390 Urinary tract infection, site not specified: Secondary | ICD-10-CM | POA: Diagnosis not present

## 2018-08-14 DIAGNOSIS — B9620 Unspecified Escherichia coli [E. coli] as the cause of diseases classified elsewhere: Secondary | ICD-10-CM | POA: Diagnosis not present

## 2018-08-14 DIAGNOSIS — Z794 Long term (current) use of insulin: Secondary | ICD-10-CM | POA: Diagnosis not present

## 2018-08-14 DIAGNOSIS — Z7982 Long term (current) use of aspirin: Secondary | ICD-10-CM | POA: Diagnosis not present

## 2018-08-14 DIAGNOSIS — J441 Chronic obstructive pulmonary disease with (acute) exacerbation: Secondary | ICD-10-CM | POA: Diagnosis not present

## 2018-08-14 DIAGNOSIS — Z7901 Long term (current) use of anticoagulants: Secondary | ICD-10-CM | POA: Insufficient documentation

## 2018-08-14 DIAGNOSIS — E785 Hyperlipidemia, unspecified: Secondary | ICD-10-CM | POA: Insufficient documentation

## 2018-08-14 DIAGNOSIS — N183 Chronic kidney disease, stage 3 (moderate): Secondary | ICD-10-CM | POA: Insufficient documentation

## 2018-08-14 DIAGNOSIS — E162 Hypoglycemia, unspecified: Secondary | ICD-10-CM | POA: Diagnosis present

## 2018-08-14 DIAGNOSIS — E11649 Type 2 diabetes mellitus with hypoglycemia without coma: Principal | ICD-10-CM | POA: Insufficient documentation

## 2018-08-14 DIAGNOSIS — Z86718 Personal history of other venous thrombosis and embolism: Secondary | ICD-10-CM | POA: Diagnosis not present

## 2018-08-14 DIAGNOSIS — Z85528 Personal history of other malignant neoplasm of kidney: Secondary | ICD-10-CM | POA: Diagnosis not present

## 2018-08-14 DIAGNOSIS — I129 Hypertensive chronic kidney disease with stage 1 through stage 4 chronic kidney disease, or unspecified chronic kidney disease: Secondary | ICD-10-CM | POA: Insufficient documentation

## 2018-08-14 DIAGNOSIS — L8990 Pressure ulcer of unspecified site, unspecified stage: Secondary | ICD-10-CM

## 2018-08-14 DIAGNOSIS — G2581 Restless legs syndrome: Secondary | ICD-10-CM | POA: Diagnosis not present

## 2018-08-14 DIAGNOSIS — Z90710 Acquired absence of both cervix and uterus: Secondary | ICD-10-CM | POA: Insufficient documentation

## 2018-08-14 DIAGNOSIS — Z7951 Long term (current) use of inhaled steroids: Secondary | ICD-10-CM | POA: Insufficient documentation

## 2018-08-14 DIAGNOSIS — J90 Pleural effusion, not elsewhere classified: Secondary | ICD-10-CM | POA: Insufficient documentation

## 2018-08-14 DIAGNOSIS — M8580 Other specified disorders of bone density and structure, unspecified site: Secondary | ICD-10-CM | POA: Diagnosis not present

## 2018-08-14 DIAGNOSIS — Z9849 Cataract extraction status, unspecified eye: Secondary | ICD-10-CM | POA: Insufficient documentation

## 2018-08-14 DIAGNOSIS — M353 Polymyalgia rheumatica: Secondary | ICD-10-CM | POA: Diagnosis not present

## 2018-08-14 DIAGNOSIS — Z833 Family history of diabetes mellitus: Secondary | ICD-10-CM | POA: Insufficient documentation

## 2018-08-14 DIAGNOSIS — R05 Cough: Secondary | ICD-10-CM | POA: Diagnosis not present

## 2018-08-14 DIAGNOSIS — F329 Major depressive disorder, single episode, unspecified: Secondary | ICD-10-CM | POA: Insufficient documentation

## 2018-08-14 DIAGNOSIS — Z96643 Presence of artificial hip joint, bilateral: Secondary | ICD-10-CM | POA: Insufficient documentation

## 2018-08-14 LAB — CBC WITH DIFFERENTIAL/PLATELET
Basophils Absolute: 0.1 K/uL (ref 0–0.1)
Basophils Relative: 0 %
Eosinophils Absolute: 0.2 K/uL (ref 0–0.7)
Eosinophils Relative: 1 %
HCT: 29.6 % — ABNORMAL LOW (ref 35.0–47.0)
Hemoglobin: 9.6 g/dL — ABNORMAL LOW (ref 12.0–16.0)
Lymphocytes Relative: 7 %
Lymphs Abs: 1 K/uL (ref 1.0–3.6)
MCH: 28.5 pg (ref 26.0–34.0)
MCHC: 32.3 g/dL (ref 32.0–36.0)
MCV: 88 fL (ref 80.0–100.0)
Monocytes Absolute: 0.5 K/uL (ref 0.2–0.9)
Monocytes Relative: 3 %
Neutro Abs: 13.8 K/uL — ABNORMAL HIGH (ref 1.4–6.5)
Neutrophils Relative %: 89 %
Platelets: 312 K/uL (ref 150–440)
RBC: 3.36 MIL/uL — ABNORMAL LOW (ref 3.80–5.20)
RDW: 19.3 % — ABNORMAL HIGH (ref 11.5–14.5)
WBC: 15.7 K/uL — ABNORMAL HIGH (ref 3.6–11.0)

## 2018-08-14 LAB — COMPREHENSIVE METABOLIC PANEL
ALK PHOS: 55 U/L (ref 38–126)
ALT: 10 U/L (ref 0–44)
AST: 20 U/L (ref 15–41)
Albumin: 2.9 g/dL — ABNORMAL LOW (ref 3.5–5.0)
Anion gap: 9 (ref 5–15)
BUN: 19 mg/dL (ref 8–23)
CALCIUM: 8.5 mg/dL — AB (ref 8.9–10.3)
CO2: 30 mmol/L (ref 22–32)
CREATININE: 1.21 mg/dL — AB (ref 0.44–1.00)
Chloride: 102 mmol/L (ref 98–111)
GFR, EST AFRICAN AMERICAN: 45 mL/min — AB (ref >60)
GFR, EST NON AFRICAN AMERICAN: 39 mL/min — AB (ref >60)
Glucose, Bld: 141 mg/dL — ABNORMAL HIGH (ref 70–99)
Potassium: 5.3 mmol/L — ABNORMAL HIGH (ref 3.5–5.1)
Sodium: 141 mmol/L (ref 135–145)
TOTAL PROTEIN: 5.6 g/dL — AB (ref 6.5–8.1)
Total Bilirubin: 0.4 mg/dL (ref 0.3–1.2)

## 2018-08-14 LAB — URINALYSIS, COMPLETE (UACMP) WITH MICROSCOPIC
Bilirubin Urine: NEGATIVE
Glucose, UA: NEGATIVE mg/dL
Hgb urine dipstick: NEGATIVE
Ketones, ur: NEGATIVE mg/dL
Nitrite: POSITIVE — AB
Protein, ur: NEGATIVE mg/dL
Specific Gravity, Urine: 1.011 (ref 1.005–1.030)
pH: 6 (ref 5.0–8.0)

## 2018-08-14 LAB — GLUCOSE, CAPILLARY
GLUCOSE-CAPILLARY: 133 mg/dL — AB (ref 70–99)
Glucose-Capillary: 89 mg/dL (ref 70–99)

## 2018-08-14 LAB — TROPONIN I: Troponin I: <0.03 ng/mL (ref <0.03)

## 2018-08-14 MED ORDER — ATENOLOL 25 MG PO TABS
25.0000 mg | ORAL_TABLET | Freq: Two times a day (BID) | ORAL | Status: DC
  Administered 2018-08-15 – 2018-08-16 (×3): 25 mg via ORAL
  Filled 2018-08-14 (×5): qty 1

## 2018-08-14 MED ORDER — INSULIN ASPART 100 UNIT/ML ~~LOC~~ SOLN
0.0000 [IU] | Freq: Three times a day (TID) | SUBCUTANEOUS | Status: DC
  Administered 2018-08-15: 17:00:00 2 [IU] via SUBCUTANEOUS
  Administered 2018-08-15: 3 [IU] via SUBCUTANEOUS
  Administered 2018-08-16: 12:00:00 2 [IU] via SUBCUTANEOUS
  Administered 2018-08-16: 1 [IU] via SUBCUTANEOUS
  Filled 2018-08-14 (×4): qty 1

## 2018-08-14 MED ORDER — ONDANSETRON HCL 4 MG PO TABS: 4.0000 mg | ORAL_TABLET | Freq: Four times a day (QID) | ORAL | Status: DC | PRN

## 2018-08-14 MED ORDER — APIXABAN 5 MG PO TABS
5.0000 mg | ORAL_TABLET | Freq: Two times a day (BID) | ORAL | Status: DC
  Administered 2018-08-15 – 2018-08-16 (×4): 5 mg via ORAL
  Filled 2018-08-14 (×4): qty 1

## 2018-08-14 MED ORDER — SODIUM CHLORIDE 0.9 % IV SOLN
1.0000 g | INTRAVENOUS | Status: DC
  Administered 2018-08-15: 1 g via INTRAVENOUS
  Filled 2018-08-14: qty 1
  Filled 2018-08-14: qty 10

## 2018-08-14 MED ORDER — PRAVASTATIN SODIUM 20 MG PO TABS
20.0000 mg | ORAL_TABLET | Freq: Every evening | ORAL | Status: DC
  Administered 2018-08-15 (×2): 20 mg via ORAL
  Filled 2018-08-14 (×2): qty 1

## 2018-08-14 MED ORDER — ACETAMINOPHEN 650 MG RE SUPP: 650.0000 mg | Freq: Four times a day (QID) | RECTAL | Status: DC | PRN

## 2018-08-14 MED ORDER — DOCUSATE SODIUM 100 MG PO CAPS
100.0000 mg | ORAL_CAPSULE | Freq: Two times a day (BID) | ORAL | Status: DC
  Administered 2018-08-15 – 2018-08-16 (×4): 100 mg via ORAL
  Filled 2018-08-14 (×4): qty 1

## 2018-08-14 MED ORDER — ASPIRIN EC 81 MG PO TBEC
81.0000 mg | DELAYED_RELEASE_TABLET | Freq: Every day | ORAL | Status: DC
  Administered 2018-08-15 – 2018-08-16 (×2): 81 mg via ORAL
  Filled 2018-08-14 (×2): qty 1

## 2018-08-14 MED ORDER — TIOTROPIUM BROMIDE MONOHYDRATE 18 MCG IN CAPS
18.0000 ug | ORAL_CAPSULE | Freq: Every day | RESPIRATORY_TRACT | Status: DC
  Administered 2018-08-15 – 2018-08-16 (×2): 18 ug via RESPIRATORY_TRACT
  Filled 2018-08-14: qty 5

## 2018-08-14 MED ORDER — POLYETHYLENE GLYCOL 3350 17 G PO PACK: 17.0000 g | PACK | Freq: Every day | ORAL | Status: DC | PRN

## 2018-08-14 MED ORDER — ONDANSETRON HCL 4 MG/2ML IJ SOLN: 4.0000 mg | Freq: Four times a day (QID) | INTRAMUSCULAR | Status: DC | PRN

## 2018-08-14 MED ORDER — FLUOXETINE HCL 20 MG PO CAPS
20.0000 mg | ORAL_CAPSULE | Freq: Every day | ORAL | Status: DC
  Administered 2018-08-15 – 2018-08-16 (×2): 20 mg via ORAL
  Filled 2018-08-14 (×2): qty 1

## 2018-08-14 MED ORDER — SODIUM CHLORIDE 0.9 % IV SOLN
1.0000 g | Freq: Once | INTRAVENOUS | Status: AC
  Administered 2018-08-14: 1 g via INTRAVENOUS
  Filled 2018-08-14: qty 10

## 2018-08-14 MED ORDER — INSULIN ASPART 100 UNIT/ML ~~LOC~~ SOLN
0.0000 [IU] | Freq: Every day | SUBCUTANEOUS | Status: DC
  Administered 2018-08-15: 2 [IU] via SUBCUTANEOUS
  Filled 2018-08-14: qty 1

## 2018-08-14 MED ORDER — AMLODIPINE BESYLATE 5 MG PO TABS
5.0000 mg | ORAL_TABLET | Freq: Every day | ORAL | Status: DC
  Administered 2018-08-15 – 2018-08-16 (×2): 5 mg via ORAL
  Filled 2018-08-14 (×2): qty 1

## 2018-08-14 MED ORDER — PREDNISONE 5 MG PO TABS
7.5000 mg | ORAL_TABLET | Freq: Every day | ORAL | Status: DC
  Administered 2018-08-15 – 2018-08-16 (×2): 7.5 mg via ORAL
  Filled 2018-08-14 (×2): qty 1

## 2018-08-14 MED ORDER — ACETAMINOPHEN 325 MG PO TABS
650.0000 mg | ORAL_TABLET | Freq: Four times a day (QID) | ORAL | Status: DC | PRN
  Administered 2018-08-15: 650 mg via ORAL
  Filled 2018-08-14: qty 2

## 2018-08-14 MED ORDER — ALBUTEROL SULFATE (2.5 MG/3ML) 0.083% IN NEBU
2.5000 mg | INHALATION_SOLUTION | RESPIRATORY_TRACT | Status: DC
  Administered 2018-08-15: 2.5 mg via RESPIRATORY_TRACT
  Filled 2018-08-14: qty 3

## 2018-08-14 MED ORDER — GABAPENTIN 300 MG PO CAPS
300.0000 mg | ORAL_CAPSULE | Freq: Three times a day (TID) | ORAL | Status: DC
  Administered 2018-08-15 – 2018-08-16 (×5): 300 mg via ORAL
  Filled 2018-08-14 (×5): qty 1

## 2018-08-14 MED ORDER — FUROSEMIDE 20 MG PO TABS
20.0000 mg | ORAL_TABLET | Freq: Every day | ORAL | Status: DC
  Administered 2018-08-15 – 2018-08-16 (×2): 20 mg via ORAL
  Filled 2018-08-14 (×2): qty 1

## 2018-08-14 NOTE — ED Notes (Signed)
CBG- 89 mg/dL

## 2018-08-14 NOTE — ED Notes (Signed)
Pt given graham crackers, apple sauce, and orange juice.

## 2018-08-14 NOTE — Clinical Social Work Note (Signed)
Clinical Social Work Assessment  Patient Details  [name] MRN: [id number] Date of Birth: Mar 06, 1929  Date of referral:  08/14/18               Reason for consult:  [employer]                Permission sought to share information with:  Family Supports, Customer service manager Permission granted to share information::  Yes, Verbal Permission Granted  Name::     Tracy Mcmahon [phone]  Agency::  All facilities  Relationship::     Contact Information:     Housing/Transportation Living arrangements for the past 2 months:  [place] of Information:  Patient, Adult Children Patient Interpreter Needed:  None Criminal Activity/Legal Involvement Pertinent to Current Situation/Hospitalization:  No - Comment as needed Significant Relationships:  Adult Children, [place], Other Family Members Lives with:  Adult Children Do you feel safe going back to the place where you live?  Yes Need for family participation in patient care:  Yes (Comment)  Care giving concerns:  Daughter is very dedicated to her care. Mother reports she is very loved and cared for.   Social Worker assessment / plan: LCSW introduced myself to patient she is oriented x3- but has a UTI- Normally oriented x4 Patient Was able to understand what happened. She reported she and her daughter ate very well last night and when my daughter went to her appointment I went to washroom and I exploded. I was able to get up and return to my recliner but I was too weak to put on my pull ups and I made another mess. Patient uses a walker, she has commode and shower seat and receives full care from her daughter with all her ADL's patient has been to [facility] before but that was because daughter had surgery. Her care giver does have several health issues and realizes when she came home the house in the front room was a disaster and she and her sisters fully intent to clean it up.  Employment status:   Herbalist) Insurance information:  Other (Comment Required), Medicare([employer] Medicare) PT Recommendations:  Not assessed at this time Information / Referral to community resources:  [place]  Patient/Family's Response to care:  They would like her to return home however if doctors recommend STR they will review  Patient/Family's Understanding of and Emotional Response to Diagnosis, Current Treatment, and Prognosis:  Patient understands there mama gets weak with UTI and confuses  Emotional Assessment Appearance:  Appears stated age Attitude/Demeanor/Rapport:  Charismatic Affect (typically observed):  Calm, Agitated Orientation:  Oriented to Self, Oriented to Place, Oriented to  Time Alcohol / Substance use:  Not Applicable Psych involvement (Current and /or in the community):  No (Comment)  Discharge Needs  Concerns to be addressed:  No discharge needs identified Readmission within the last 30 days:  No Current discharge risk:  None Barriers to Discharge:  Family Issues, No Barriers Identified   Tracy Reamer, LCSW 08/14/2018, 7:10 PM

## 2018-08-14 NOTE — NC FL2 (Addendum)
[place] LEVEL OF CARE SCREENING TOOL     IDENTIFICATION  Patient [name] Birthdate: 1929/05/16 Sex: female Admission Date (Current Location): 08/14/2018  [place] and [place] Number:  Engineering geologist and Address:  [facility], [address] [place], [place] [address]      Provider Number: [id number]  Attending Physician Name and Address:  Schuyler Amor, MD  Relative Name and Phone Number:   [name] [phone]    Current Level of Care: Hospital Recommended Level of Care:   Prior Approval Number:    Date Approved/Denied:   PASRR Number: [id number]  Discharge Plan: Home    Current Diagnoses: Patient Active Problem List   Diagnosis Date Noted  . Weakness 06/26/2018  . Hypoglycemia 04/30/2018  . UTI (urinary tract infection) 03/15/2018  . Acute encephalopathy 12/19/2016  . Generalized weakness 12/19/2016  . Acute on chronic respiratory failure with hypoxia and hypercapnia ([place]) 12/16/2016  . COPD exacerbation ([place]) 12/16/2016  . Community acquired pneumonia 12/16/2016  . Lactic acidosis 12/16/2016  . Sepsis ([place]) 12/16/2016  . PE (pulmonary embolism) 08/11/2016  . COPD (chronic obstructive pulmonary disease) ([place]) 03/29/2015  . Hypertension 03/29/2015  . Diabetes ([place]) 03/29/2015  . Hyperlipidemia 03/29/2015  . Depression 03/29/2015  . Restless leg syndrome 03/29/2015  . DVT (deep venous thrombosis) ([place]) 03/29/2015  . CKD (chronic kidney disease), stage III ([place]) 03/29/2015    Orientation RESPIRATION BLADDER Height & Weight     Self, Time, Situation, Place  Normal Incontinent Weight: 200 lb (90.7 kg) Height:  5' (152.4 cm)  BEHAVIORAL SYMPTOMS/MOOD NEUROLOGICAL BOWEL NUTRITION STATUS      Incontinent    AMBULATORY STATUS COMMUNICATION OF NEEDS Skin   Limited Assist Verbally Normal                       Personal Care Assistance Level of Assistance  Bathing, Feeding, Dressing,  Total care Bathing Assistance: Limited assistance Feeding assistance: Independent Dressing Assistance: Limited assistance Total Care Assistance: Limited assistance   Functional Limitations Info  Sight, Hearing, Speech Sight Info: Adequate Hearing Info: Adequate Speech Info: Adequate    SPECIAL CARE FACTORS FREQUENCY                       Contractures Contractures Info: Not present    Additional Factors Info  Allergies, Code Status Code Status Info: Full code Allergies Info: Fosomax, wellbutrin           Current Medications (08/14/2018):  This is the current hospital active medication list Current Facility-Administered Medications  Medication Dose Route Frequency Provider Last Rate Last Dose  . cefTRIAXone (ROCEPHIN) 1 g in sodium chloride 0.9 % 100 mL IVPB  1 g Intravenous Once Schuyler Amor, MD 200 mL/hr at 08/14/18 1931 1 g at 08/14/18 1931   Current Outpatient Medications  Medication Sig Dispense Refill  . albuterol (PROVENTIL) (2.5 MG/3ML) 0.083% nebulizer solution Take 3 mLs (2.5 mg total) by nebulization every 4 (four) hours. 360 vial 6  . alendronate (FOSAMAX) 35 MG tablet Take 35 mg by mouth every 7 (seven) days. Take with a full glass of water on an empty stomach.    Marland Kitchen amLODipine (NORVASC) 5 MG tablet Take 1 tablet (5 mg total) by mouth daily. 30 tablet 2  . apixaban (ELIQUIS) 5 MG TABS tablet Take 1 tablet (5 mg total) by mouth 2 (two) times daily. 60 tablet 1  . aspirin  EC 81 MG tablet Take 81 mg by mouth daily.    Marland Kitchen atenolol (TENORMIN) 25 MG tablet Take 25 mg by mouth 2 (two) times daily.    Marland Kitchen azelastine (OPTIVAR) 0.05 % ophthalmic solution Place 1 drop into both eyes 2 (two) times daily as needed for itching.  99  . diphenhydramine-acetaminophen (TYLENOL PM) 25-500 MG TABS tablet Take 1 tablet by mouth at bedtime as needed (sleep or pain).    Marland Kitchen docusate sodium (COLACE) 100 MG capsule Take 1 capsule (100 mg total) by mouth 2 (two) times daily. 10 capsule  0  . FLUoxetine (PROZAC) 20 MG capsule Take 20 mg by mouth daily.    . furosemide (LASIX) 20 MG tablet Take 20 mg by mouth daily.    Marland Kitchen gabapentin (NEURONTIN) 300 MG capsule Take 1 capsule (300MG ) by mouth every morning, 1 capsule (300MG ) every evening and 3 capsules (900MG ) at bedtime    . insulin aspart (NOVOLOG) 100 UNIT/ML injection Inject 0-9 Units into the skin 3 (three) times daily with meals. 10 mL 11  . insulin glargine (LANTUS) 100 UNIT/ML injection Inject 0.1 mLs (10 Units total) into the skin at bedtime. 10 mL 0  . Multiple Minerals-Vitamins (GNP CAL MAG ZINC +D3 PO) Take 1 tablet by mouth daily.    . polyethylene glycol (MIRALAX / GLYCOLAX) packet Take 17 g by mouth daily as needed for mild constipation. 14 each 0  . potassium chloride (KLOR-CON) 20 MEQ packet Take 20 mEq by mouth daily. While on lasix 30 tablet 0  . pravastatin (PRAVACHOL) 20 MG tablet Take 20 mg by mouth every evening.     . predniSONE (DELTASONE) 5 MG tablet Take 7.5 mg by mouth daily.     Marland Kitchen tiotropium (SPIRIVA) 18 MCG inhalation capsule Place 1 capsule (18 mcg total) into inhaler and inhale daily. (Patient not taking: Reported on 06/26/2018) 30 capsule 12  . vitamin B-12 (CYANOCOBALAMIN) 1000 MCG tablet Take 1,000 mcg by mouth daily.    . Vitamin D, Ergocalciferol, (DRISDOL) 50000 UNITS CAPS capsule Take 50,000 Units by mouth every Monday.        Discharge Medications: Please see discharge summary for a list of discharge medications.  Relevant Imaging Results:  Relevant Lab Results:   Additional Information [phone]  Joana Reamer, [place]

## 2018-08-14 NOTE — ED Triage Notes (Signed)
Patient presents to the ED via EMS from home for hypoglycemia.  Per EMS patient's blood sugar was 32.  Patient appeared weak and confused.  EMS states patient was sitting in large amount of stool and urine at her home.  States chairs at patient's home were saturated with urine.  Patient states she lives at home alone.  Patient reports she does not remember what happened.  EMS gave 2 tubes of oral glucose and 1 dose of glucagon.

## 2018-08-14 NOTE — H&P (Signed)
[place] [facility] [address] [name]    MR#:  [id number]  DATE OF BIRTH:  26-Mar-1929  DATE OF ADMISSION:  08/14/2018  PRIMARY CARE PHYSICIAN: [name], MD   REQUESTING/REFERRING PHYSICIAN: Charlotte Crumb, MD  CHIEF COMPLAINT:   Chief Complaint  Patient presents with  . Hypoglycemia    HISTORY OF PRESENT ILLNESS:  Tracy Mcmahon  is a 84 y.o. female with a known history of HTN, T2DM, HLD, CKD III, COPD, polymyalgia rheumatica, hx of renal cell cancer who presented to the ED with hypoglycemia. Her family has difficulty taking care of her at home. She was in the recliner this morning and her daughter could not get her up because she has foot problems. Her daughter left to go to a doctor's appointment in [place] and when she returned, the patient was still in the recliner and seemed "out of it". Daughter called EMS and they checked her blood sugar, which was 30. She was given orange juice and was brought to the ED. She denies dysuria and urinary urgency. She endorses frequency, malodorous urine, and suprapubic pain over the last few days.  In the ED, she was afebrile. Initial blood sugar was 89. Labs were significant for WBC 15.7. UA with small leukocytes, positive nitrites, and many bacteria. She was given ceftriaxone. Hospitalists were called for admission.  PAST MEDICAL HISTORY:   Past Medical History:  Diagnosis Date  . Cancer ([place])   . Cataract   . Chronic kidney disease (CKD), stage III (moderate) (HCC)   . Chronic urticaria   . COPD (chronic obstructive pulmonary disease) ([place])   . Depression   . Diabetes mellitus without complication ([place])   . DVT (deep venous thrombosis) (HCC)    left leg  . Hyperlipemia   . Hypertension   . Osteoarthritis   . Osteopenia   . Polymyalgia rheumatica ([place])   . Polyneuropathy   . Renal cancer ([place])   . Renal insufficiency 08/10/2016   Chart indicates CKD stage 3  . Restless leg syndrome    . Vitamin D deficiency   . Vulvar intraepithelial neoplasia with lichen sclerosus     PAST SURGICAL HISTORY:   Past Surgical History:  Procedure Laterality Date  . ABDOMINAL HYSTERECTOMY    . APPENDECTOMY    . CATARACT EXTRACTION    . COLONOSCOPY    . JOINT REPLACEMENT Bilateral    hip  . LAMINOTOMY    . NEPHRECTOMY    . TEMPORAL ARTERY BIOPSY / LIGATION    . TONSILLECTOMY      SOCIAL HISTORY:   Social History   Tobacco Use  . Smoking status: Never Smoker  . Smokeless tobacco: Never Used  Substance Use Topics  . Alcohol use: No    Alcohol/week: 0.0 standard drinks    FAMILY HISTORY:   Family History  Problem Relation Age of Onset  . Hypertension Unknown   . Diabetes Unknown     DRUG ALLERGIES:   Allergies  Allergen Reactions  . Fosamax [Alendronate Sodium] Other (See Comments)    Reaction:  Made pt "very sick"  . Wellbutrin [Bupropion] Other (See Comments)    Reaction:  Unknown    REVIEW OF SYSTEMS:   Review of Systems  Constitutional: Negative for chills and fever.  HENT: Negative for congestion and sore throat.   Eyes: Negative for blurred vision and double vision.  Respiratory: Negative for cough and shortness of breath.   Cardiovascular: Negative  for chest pain, palpitations and leg swelling.  Gastrointestinal: Negative for abdominal pain, nausea and vomiting.  Genitourinary: Positive for frequency. Negative for dysuria, flank pain and urgency.  Musculoskeletal: Negative for back pain, falls and neck pain.  Neurological: Negative for dizziness and headaches.  Psychiatric/Behavioral: Negative for depression. The patient is not nervous/anxious.     MEDICATIONS AT HOME:   Prior to Admission medications   Medication Sig Start Date End Date Taking? Authorizing Provider  albuterol (PROVENTIL) (2.5 MG/3ML) 0.083% nebulizer solution Take 3 mLs (2.5 mg total) by nebulization every 4 (four) hours. 12/19/16   Theodoro Grist, MD  alendronate (FOSAMAX)  35 MG tablet Take 35 mg by mouth every 7 (seven) days. Take with a full glass of water on an empty stomach.    [provider]  amLODipine (NORVASC) 5 MG tablet Take 1 tablet (5 mg total) by mouth daily. 05/03/18   Gladstone Lighter, MD  apixaban (ELIQUIS) 5 MG TABS tablet Take 1 tablet (5 mg total) by mouth 2 (two) times daily. 05/03/18   Gladstone Lighter, MD  aspirin EC 81 MG tablet Take 81 mg by mouth daily.    [provider]  atenolol (TENORMIN) 25 MG tablet Take 25 mg by mouth 2 (two) times daily.    [provider]  azelastine (OPTIVAR) 0.05 % ophthalmic solution Place 1 drop into both eyes 2 (two) times daily as needed for itching. 06/19/18   [provider]  diphenhydramine-acetaminophen (TYLENOL PM) 25-500 MG TABS tablet Take 1 tablet by mouth at bedtime as needed (sleep or pain).    [provider]  docusate sodium (COLACE) 100 MG capsule Take 1 capsule (100 mg total) by mouth 2 (two) times daily. 06/29/18   Gladstone Lighter, MD  FLUoxetine (PROZAC) 20 MG capsule Take 20 mg by mouth daily.    [provider]  furosemide (LASIX) 20 MG tablet Take 20 mg by mouth daily.    [provider]  gabapentin (NEURONTIN) 300 MG capsule Take 1 capsule (300MG ) by mouth every morning, 1 capsule (300MG ) every evening and 3 capsules (900MG ) at bedtime    [provider]  insulin aspart (NOVOLOG) 100 UNIT/ML injection Inject 0-9 Units into the skin 3 (three) times daily with meals. 06/29/18   Gladstone Lighter, MD  insulin glargine (LANTUS) 100 UNIT/ML injection Inject 0.1 mLs (10 Units total) into the skin at bedtime. 06/29/18   Gladstone Lighter, MD  Multiple Minerals-Vitamins (GNP CAL MAG ZINC +D3 PO) Take 1 tablet by mouth daily.    [provider]  polyethylene glycol (MIRALAX / GLYCOLAX) packet Take 17 g by mouth daily as needed for mild constipation. 06/29/18   Gladstone Lighter, MD  potassium chloride (KLOR-CON) 20 MEQ packet  Take 20 mEq by mouth daily. While on lasix 06/29/18   Gladstone Lighter, MD  pravastatin (PRAVACHOL) 20 MG tablet Take 20 mg by mouth every evening.     [provider]  predniSONE (DELTASONE) 5 MG tablet Take 7.5 mg by mouth daily.     [provider]  tiotropium (SPIRIVA) 18 MCG inhalation capsule Place 1 capsule (18 mcg total) into inhaler and inhale daily. Patient not taking: Reported on 06/26/2018 12/20/16   Theodoro Grist, MD  vitamin B-12 (CYANOCOBALAMIN) 1000 MCG tablet Take 1,000 mcg by mouth daily.    [provider]  Vitamin D, Ergocalciferol, (DRISDOL) 50000 UNITS CAPS capsule Take 50,000 Units by mouth every Monday.     [provider]  VITAL SIGNS:  Blood pressure (!) 126/58, pulse 78, temperature 97.8 F (36.6 C), temperature source Oral, resp. rate 16, height 5' (1.524 m), weight 90.7 kg, SpO2 100 %.  PHYSICAL EXAMINATION:  Physical Exam  GENERAL:  84 y.o.-year-old patient lying in the bed with no acute distress.  EYES: Pupils equal, round, reactive to light and accommodation. No scleral icterus. Extraocular muscles intact.  HEENT: Head atraumatic, normocephalic. Oropharynx and nasopharynx clear.  NECK:  Supple, no jugular venous distention. No thyroid enlargement, no tenderness.  LUNGS: Normal breath sounds bilaterally, no wheezing, rales,rhonchi or crepitation. No use of accessory muscles of respiration.  CARDIOVASCULAR: RRR, S1, S2 normal. No murmurs, rubs, or gallops.  ABDOMEN: +suprapubic abdominal pain, soft, nondistended. Bowel sounds present. No organomegaly or mass.  EXTREMITIES: No pedal edema, cyanosis, or clubbing.  NEUROLOGIC: Cranial nerves II through XII are intact. +global weakness. Sensation intact. Gait not checked.  PSYCHIATRIC: The patient is alert and oriented x 3.  SKIN: No obvious rash, lesion, or ulcer.   LABORATORY PANEL:   CBC Recent Labs  Lab 08/14/18 1635  WBC 15.7*  HGB 9.6*  HCT 29.6*  PLT 312    ------------------------------------------------------------------------------------------------------------------  Chemistries  Recent Labs  Lab 08/14/18 1653  NA 141  K 5.3*  CL 102  CO2 30  GLUCOSE 141*  BUN 19  CREATININE 1.21*  CALCIUM 8.5*  AST 20  ALT 10  ALKPHOS 55  BILITOT 0.4   ------------------------------------------------------------------------------------------------------------------  Cardiac Enzymes Recent Labs  Lab 08/14/18 1653  TROPONINI <0.03   ------------------------------------------------------------------------------------------------------------------  RADIOLOGY:  Dg Chest 2 View  Result Date: 08/14/2018 CLINICAL DATA:  Cough EXAM: CHEST - 2 VIEW COMPARISON:  06/26/2018, 04/30/2018, 08/10/2016 FINDINGS: Small left greater than right pleural effusion. Cardiomegaly. Patchy opacity at the peripheral left lung base. Aortic atherosclerosis. No pneumothorax. IMPRESSION: 1. Cardiomegaly. 2. Suspected small pleural effusion, left greater than right superimposed on chronic pleural changes. Patchy airspace disease is noted at the peripheral left lung base. Electronically Signed   By: Donavan Foil M.D.   On: 08/14/2018 17:47      IMPRESSION AND PLAN:   Hypoglycemia with a history of type 2 diabetes- resolved. Related to taking her insulin and not eating. Initial blood sugar at home was 30. Recent a1c was 7.2. - sensitive SSI - hold on long-acting insulin for now - check blood sugars with meals and at night  UTI- UA infectious. No signs of pyelonephritis or sepsis. Most recent UTI 06/26/18 grew pansensitive E. Coli. - continue ceftriaxone - urine culture pending  Hypertension- normotensive here - continue to norvasc, atenolol  Hyperlipidemia- stable - continue home pravastatin  COPD- stable, no signs of acute exacerbation - continue albuterol and spiriva  Polymyalgia rheumatica- stable - continue prednisone  Depression- stable - continue  home prozac  History of DVT- on chronic anticoagulation - continue home eliquis  Generalized weakness- uses walker at baseline. Likely related to UTI with some baseline deconditioning. - PT/OT consult  All the records are reviewed and case discussed with ED provider. Management plans discussed with the patient, family and they are in agreement.  CODE STATUS: DNI  TOTAL TIME TAKING CARE OF THIS PATIENT: 45 minutes.    Berna Spare Tinley Rought M.D on 08/14/2018 at 8:15 PM  Between 7am to 6pm - Pager - [phone]  After 6pm go to [email or web address] - Technical brewer [place] [facility]  Office  [phone]  CC: Primary care physician; [name], MD   Note: This dictation was  prepared with Dragon dictation along with smaller phrase technology. Any transcriptional errors that result from this process are unintentional.

## 2018-08-14 NOTE — ED Notes (Signed)
Patient transported to X-ray 

## 2018-08-14 NOTE — ED Provider Notes (Addendum)
[facility] Emergency Department Provider Note  ____________________________________________   I have reviewed the triage vital signs and the nursing notes. Where available I have reviewed prior notes and, if possible and indicated, outside hospital notes.    HISTORY  Chief Complaint Hypoglycemia    HPI Tracy Mcmahon is a 84 y.o. female multiple medical problems, see below, presents today complaining of having had a low blood sugar.  Patient had her diabetic medications however did not have anything to eat or drink, after breakfast.  She only had toast for breakfast.  When she missed lunch, she states that she became tired and she was sitting on a chair.  She did not fall.  According to EMS reports, patient was lethargic and difficult to arouse and was found to have a blood sugar of 30.  She woke up as soon as they gave her some glucagon, and they also gave her oral sugar.  There was no trauma.  She has no complaints at this time.  She states that she was just sleepy.  Repeat sugars here are normal.  Past Medical History:  Diagnosis Date  . Cancer ([place])   . Cataract   . Chronic kidney disease (CKD), stage III (moderate) (HCC)   . Chronic urticaria   . COPD (chronic obstructive pulmonary disease) ([place])   . Depression   . Diabetes mellitus without complication ([place])   . DVT (deep venous thrombosis) (HCC)    left leg  . Hyperlipemia   . Hypertension   . Osteoarthritis   . Osteopenia   . Polymyalgia rheumatica ([place])   . Polyneuropathy   . Renal cancer ([place])   . Renal insufficiency 08/10/2016   Chart indicates CKD stage 3  . Restless leg syndrome   . Vitamin D deficiency   . Vulvar intraepithelial neoplasia with lichen sclerosus     Patient Active Problem List   Diagnosis Date Noted  . Weakness 06/26/2018  . Hypoglycemia 04/30/2018  . UTI (urinary tract infection) 03/15/2018  . Acute encephalopathy 12/19/2016  . Generalized weakness 12/19/2016  .  Acute on chronic respiratory failure with hypoxia and hypercapnia ([place]) 12/16/2016  . COPD exacerbation ([place]) 12/16/2016  . Community acquired pneumonia 12/16/2016  . Lactic acidosis 12/16/2016  . Sepsis ([place]) 12/16/2016  . PE (pulmonary embolism) 08/11/2016  . COPD (chronic obstructive pulmonary disease) ([place]) 03/29/2015  . Hypertension 03/29/2015  . Diabetes ([place]) 03/29/2015  . Hyperlipidemia 03/29/2015  . Depression 03/29/2015  . Restless leg syndrome 03/29/2015  . DVT (deep venous thrombosis) ([place]) 03/29/2015  . CKD (chronic kidney disease), stage III ([place]) 03/29/2015    Past Surgical History:  Procedure Laterality Date  . ABDOMINAL HYSTERECTOMY    . APPENDECTOMY    . CATARACT EXTRACTION    . COLONOSCOPY    . JOINT REPLACEMENT Bilateral    hip  . LAMINOTOMY    . NEPHRECTOMY    . TEMPORAL ARTERY BIOPSY / LIGATION    . TONSILLECTOMY      Prior to Admission medications   Medication Sig Start Date End Date Taking? Authorizing Provider  albuterol (PROVENTIL) (2.5 MG/3ML) 0.083% nebulizer solution Take 3 mLs (2.5 mg total) by nebulization every 4 (four) hours. 12/19/16   Theodoro Grist, MD  alendronate (FOSAMAX) 35 MG tablet Take 35 mg by mouth every 7 (seven) days. Take with a full glass of water on an empty stomach.    [provider]  amLODipine (NORVASC) 5 MG tablet Take 1 tablet (5 mg total)  by mouth daily. 05/03/18   Gladstone Lighter, MD  apixaban (ELIQUIS) 5 MG TABS tablet Take 1 tablet (5 mg total) by mouth 2 (two) times daily. 05/03/18   Gladstone Lighter, MD  aspirin EC 81 MG tablet Take 81 mg by mouth daily.    [provider]  atenolol (TENORMIN) 25 MG tablet Take 25 mg by mouth 2 (two) times daily.    [provider]  azelastine (OPTIVAR) 0.05 % ophthalmic solution Place 1 drop into both eyes 2 (two) times daily as needed for itching. 06/19/18   [provider]  diphenhydramine-acetaminophen (TYLENOL PM) 25-500 MG TABS tablet Take 1  tablet by mouth at bedtime as needed (sleep or pain).    [provider]  docusate sodium (COLACE) 100 MG capsule Take 1 capsule (100 mg total) by mouth 2 (two) times daily. 06/29/18   Gladstone Lighter, MD  FLUoxetine (PROZAC) 20 MG capsule Take 20 mg by mouth daily.    [provider]  furosemide (LASIX) 20 MG tablet Take 20 mg by mouth daily.    [provider]  gabapentin (NEURONTIN) 300 MG capsule Take 1 capsule (300MG ) by mouth every morning, 1 capsule (300MG ) every evening and 3 capsules (900MG ) at bedtime    [provider]  insulin aspart (NOVOLOG) 100 UNIT/ML injection Inject 0-9 Units into the skin 3 (three) times daily with meals. 06/29/18   Gladstone Lighter, MD  insulin glargine (LANTUS) 100 UNIT/ML injection Inject 0.1 mLs (10 Units total) into the skin at bedtime. 06/29/18   Gladstone Lighter, MD  Multiple Minerals-Vitamins (GNP CAL MAG ZINC +D3 PO) Take 1 tablet by mouth daily.    [provider]  polyethylene glycol (MIRALAX / GLYCOLAX) packet Take 17 g by mouth daily as needed for mild constipation. 06/29/18   Gladstone Lighter, MD  potassium chloride (KLOR-CON) 20 MEQ packet Take 20 mEq by mouth daily. While on lasix 06/29/18   Gladstone Lighter, MD  pravastatin (PRAVACHOL) 20 MG tablet Take 20 mg by mouth every evening.     [provider]  predniSONE (DELTASONE) 5 MG tablet Take 7.5 mg by mouth daily.     [provider]  tiotropium (SPIRIVA) 18 MCG inhalation capsule Place 1 capsule (18 mcg total) into inhaler and inhale daily. Patient not taking: Reported on 06/26/2018 12/20/16   Theodoro Grist, MD  vitamin B-12 (CYANOCOBALAMIN) 1000 MCG tablet Take 1,000 mcg by mouth daily.    [provider]  Vitamin D, Ergocalciferol, (DRISDOL) 50000 UNITS CAPS capsule Take 50,000 Units by mouth every Monday.     [provider]    Allergies Fosamax [alendronate sodium] and Wellbutrin [bupropion]  Family History   Problem Relation Age of Onset  . Hypertension Unknown   . Diabetes Unknown     Social History Social History   Tobacco Use  . Smoking status: Never Smoker  . Smokeless tobacco: Never Used  Substance Use Topics  . Alcohol use: No    Alcohol/week: 0.0 standard drinks  . Drug use: No    Review of Systems Constitutional: No fever/chills Eyes: No visual changes. ENT: No sore throat. No stiff neck no neck pain Cardiovascular: Denies chest pain. Respiratory: Denies shortness of breath. Gastrointestinal:   no vomiting.  No diarrhea.  No constipation. Genitourinary: Negative for dysuria. Musculoskeletal: Negative lower extremity swelling Skin: Negative for rash. Neurological: Negative for severe headaches, focal weakness or numbness.   ____________________________________________   PHYSICAL EXAM:  VITAL SIGNS: ED Triage Vitals  Enc  Vitals Group     BP 08/14/18 1527 134/64     Pulse Rate 08/14/18 1527 63     Resp 08/14/18 1527 18     Temp 08/14/18 1527 97.8 F (36.6 C)     Temp Source 08/14/18 1527 Oral     SpO2 08/14/18 1527 98 %     Weight 08/14/18 1529 200 lb (90.7 kg)     Height 08/14/18 1529 5' (1.524 m)     Head Circumference --      Peak Flow --      Pain Score 08/14/18 1527 0     Pain Loc --      Pain Edu? --      Excl. in [place]? --     Constitutional: Alert and oriented. Well appearing and in no acute distress. Eyes: Conjunctivae are normal Head: Atraumatic HEENT: No congestion/rhinnorhea. Mucous membranes are moist.  Oropharynx non-erythematous Neck:   Nontender with no meningismus, no masses, no stridor Cardiovascular: Normal rate, regular rhythm. Grossly normal heart sounds.  Good peripheral circulation. Respiratory: Normal respiratory effort.  No retractions. Lungs CTAB. Abdominal: Soft and nontender. No distention. No guarding no rebound Back:  There is no focal tenderness or step off.  there is no midline tenderness there are no lesions noted. there  is no CVA tenderness Musculoskeletal: No lower extremity tenderness, no upper extremity tenderness. No joint effusions, no DVT signs strong distal pulses no edema Neurologic:  Normal speech and language. No gross focal neurologic deficits are appreciated.  Skin:  Skin is warm, dry and intact. No rash noted. Psychiatric: Mood and affect are normal. Speech and behavior are normal.  ____________________________________________   LABS (all labs ordered are listed, but only abnormal results are displayed)  Labs Reviewed  GLUCOSE, CAPILLARY  CBC WITH DIFFERENTIAL/PLATELET  COMPREHENSIVE METABOLIC PANEL  URINALYSIS, COMPLETE (UACMP) WITH MICROSCOPIC  TROPONIN I  CBG MONITORING, ED    Pertinent labs  results that were available during my care of the patient were reviewed by me and considered in my medical decision making (see chart for details). ____________________________________________  EKG  I personally interpreted any EKGs ordered by me or triage Atrial fibrillation rate 77 bpm no acute ST elevation or depression ossific ST changes LAD noted ____________________________________________  RADIOLOGY  Pertinent labs & imaging results that were available during my care of the patient were reviewed by me and considered in my medical decision making (see chart for details). If possible, patient and/or family made aware of any abnormal findings.  No results found. ____________________________________________    PROCEDURES  Procedure(s) performed: None  Procedures  Critical Care performed: None  ____________________________________________   INITIAL IMPRESSION / ASSESSMENT AND PLAN / ED COURSE  Pertinent labs & imaging results that were available during my care of the patient were reviewed by me and considered in my medical decision making (see chart for details).  Patient was hypoglycemic at this time has no complaints.  Given her age, and the fact that she was having  difficulty being aroused, which I am sure was because of the sugar, we will recheck basic blood work etc. to make sure nothing else is afoot.  If no other pathology is found and her sugars remain stable I hope that we can get her home.  ----------------------------------------- 6:59 PM on 08/14/2018 ----------------------------------------- The only report that they are unable to very much take care of her, she was in a reclining chair this morning and they could not get her up, she consequently  did not have much to eat today.  She was left alone while the family member went off to the doctor when the family member came back she was not unconscious but she was less responsive and that is when they noticed a sugar of 30.  Patient had not really had much to eat today.  The patient has a nitrite positive urinary tract infection.  Have consulted social work as I think that will probably need some help at home, we will admit her for IV antibiotics hypoglycemia and further observation and evaluation  .     ____________________________________________   FINAL CLINICAL IMPRESSION(S) / ED DIAGNOSES  Final diagnoses:  None      This chart was dictated using voice recognition software.  Despite best efforts to proofread,  errors can occur which can change meaning.      Schuyler Amor, MD 08/14/18 1631    Schuyler Amor, MD 08/14/18 1900    Schuyler Amor, MD 08/14/18 [phone]

## 2018-08-14 NOTE — Progress Notes (Signed)
Family Meeting Note  Advance Directive:no  Today a meeting took place with the Patient and daughter.  Patient is able to participate  The following clinical team members were present during this meeting:MD  The following were discussed:Patient's diagnosis: , Patient's progosis: Unable to determine and Goals for treatment: DNI  Additional follow-up to be provided: prn  Time spent during discussion:20 minutes  Evette Doffing, MD

## 2018-08-15 DIAGNOSIS — E162 Hypoglycemia, unspecified: Secondary | ICD-10-CM | POA: Diagnosis not present

## 2018-08-15 DIAGNOSIS — E785 Hyperlipidemia, unspecified: Secondary | ICD-10-CM | POA: Diagnosis not present

## 2018-08-15 DIAGNOSIS — N390 Urinary tract infection, site not specified: Secondary | ICD-10-CM | POA: Diagnosis not present

## 2018-08-15 DIAGNOSIS — I10 Essential (primary) hypertension: Secondary | ICD-10-CM | POA: Diagnosis not present

## 2018-08-15 DIAGNOSIS — L8990 Pressure ulcer of unspecified site, unspecified stage: Secondary | ICD-10-CM

## 2018-08-15 LAB — CBC
HEMATOCRIT: 28.7 % — AB (ref 35.0–47.0)
HEMOGLOBIN: 9.4 g/dL — AB (ref 12.0–16.0)
MCH: 28.7 pg (ref 26.0–34.0)
MCHC: 32.9 g/dL (ref 32.0–36.0)
MCV: 87.3 fL (ref 80.0–100.0)
PLATELETS: 337 10*3/uL (ref 150–440)
RBC: 3.29 MIL/uL — AB (ref 3.80–5.20)
RDW: 19.5 % — ABNORMAL HIGH (ref 11.5–14.5)
WBC: 10.5 10*3/uL (ref 3.6–11.0)

## 2018-08-15 LAB — GLUCOSE, CAPILLARY
GLUCOSE-CAPILLARY: 201 mg/dL — AB (ref 70–99)
GLUCOSE-CAPILLARY: 191 mg/dL — AB (ref 70–99)
Glucose-Capillary: 80 mg/dL (ref 70–99)
Glucose-Capillary: 220 mg/dL — ABNORMAL HIGH (ref 70–99)

## 2018-08-15 LAB — BASIC METABOLIC PANEL
ANION GAP: 8 (ref 5–15)
BUN: 20 mg/dL (ref 8–23)
CHLORIDE: 103 mmol/L (ref 98–111)
CO2: 31 mmol/L (ref 22–32)
CREATININE: 1.25 mg/dL — AB (ref 0.44–1.00)
Calcium: 8.6 mg/dL — ABNORMAL LOW (ref 8.9–10.3)
GFR calc non Af Amer: 38 mL/min — ABNORMAL LOW (ref >60)
GFR, EST AFRICAN AMERICAN: 44 mL/min — AB (ref >60)
Glucose, Bld: 115 mg/dL — ABNORMAL HIGH (ref 70–99)
POTASSIUM: 4.8 mmol/L (ref 3.5–5.1)
Sodium: 142 mmol/L (ref 135–145)

## 2018-08-15 MED ORDER — SODIUM CHLORIDE 0.9% FLUSH
3.0000 mL | Freq: Two times a day (BID) | INTRAVENOUS | Status: DC
  Administered 2018-08-15 – 2018-08-16 (×4): 3 mL via INTRAVENOUS

## 2018-08-15 MED ORDER — RAMELTEON 8 MG PO TABS
8.0000 mg | ORAL_TABLET | Freq: Every day | ORAL | Status: DC
  Administered 2018-08-15: 21:00:00 8 mg via ORAL
  Filled 2018-08-15 (×4): qty 1

## 2018-08-15 MED ORDER — ALBUTEROL SULFATE (2.5 MG/3ML) 0.083% IN NEBU: 2.5000 mg | INHALATION_SOLUTION | RESPIRATORY_TRACT | Status: DC | PRN

## 2018-08-15 NOTE — Plan of Care (Signed)
  Problem: Health Behavior/Discharge Planning: Goal: Ability to manage health-related needs will improve Outcome: Progressing   Problem: Clinical Measurements: Goal: Ability to maintain clinical measurements within normal limits will improve Outcome: Progressing Goal: Will remain free from infection Outcome: Progressing Goal: Respiratory complications will improve Outcome: Progressing   Problem: Activity: Goal: Risk for activity intolerance will decrease Outcome: Progressing   Problem: Nutrition: Goal: Adequate nutrition will be maintained Outcome: Progressing   Problem: Coping: Goal: Level of anxiety will decrease Outcome: Progressing   Problem: Elimination: Goal: Will not experience complications related to bowel motility Outcome: Progressing   Problem: Pain Managment: Goal: General experience of comfort will improve Outcome: Progressing   Problem: Safety: Goal: Ability to remain free from injury will improve Outcome: Progressing   Problem: Skin Integrity: Goal: Risk for impaired skin integrity will decrease Outcome: Progressing   Problem: Urinary Elimination: Goal: Signs and symptoms of infection will decrease Outcome: Progressing

## 2018-08-15 NOTE — Evaluation (Signed)
Occupational Therapy Evaluation Patient Details [name] MRN: [id number] DOB: Nov 09, 1929 Today's Date: 08/15/2018    History of Present Illness Tracy Mcmahon  is a 84 y.o. female with a known history of HTN, T2DM, HLD, CKD III, COPD, polymyalgia rheumatica, hx of renal cell cancer who presented to the ED with hypoglycemia. Her family has difficulty taking care of her at home. She was in the recliner this morning and her daughter could not get her up because she has foot problems. Her daughter left to go to a doctor's appointment in [place] and when she returned, the patient was still in the recliner and seemed "out of it". Daughter called EMS and they checked her blood sugar, which was 30. She was given orange juice and was brought to the ED. She denies dysuria and urinary urgency. She endorses frequency, malodorous urine, and suprapubic pain over the last few days.   Clinical Impression   Pt in bed upon arrival - pt willing to get up and work with therapy - wants to get home- she report she is on 2L of O2 at home but not here - report not feeling good- just tired and weak - but O2 sats 95% and HR 85 - bed mobility SBA and functional mobility to BR CG with v/c for safety using RW- she is min A for ADL's and report only using slip in shoes for she wear- and no needing help with ADL's at home -pt had decrease bilateral shoulder ROM - grip and elbow distal strength WNL - pt was incontinent when getting up - but did use BR - and in hospital with UTI - pt can benefit from OT services on LB dressing using AE , safety  functional mobility and energy conservation in ADL's and ADL's    Follow Up Recommendations       Equipment Recommendations       Recommendations for Other Services PT consult     Precautions / Restrictions Precautions Precautions: Fall      Mobility Bed Mobility MIN A  with moving up in Bed - but Supine <> sit S               General bed mobility comments: Min  A   Transfers Overall transfer level: (To bathroom CG but no LOB - but v/c to use RW safely) Equipment used: Rolling walker (2 wheeled)                  Balance Overall balance assessment: (No LOB in sitting EOB or func mobility to BR )                                         ADL either performed or assessed with clinical judgement   ADL Overall ADL's : (UB dressing SBA, LB dressing mod A  - pt report she use slip in shoes and no scoks- needed A with depend this date )                                       General ADL Comments: S to CG for toiletting to bathroom- hygiend I  -and no LOB - but v/c for safetry and hand placement      Vision Baseline Vision/History: Wears glasses  Perception     Praxis      Pertinent Vitals/Pain Pain Assessment: No/denies pain     Hand Dominance     Extremity/Trunk Assessment Upper Extremity Assessment Upper Extremity Assessment: (bilateral shoulder AROM to 80-90 degrees- but can reach functionally with elbow  and good grip )           Communication Communication Communication: No difficulties   Cognition Arousal/Alertness: Awake/alert Behavior During Therapy: WFL for tasks assessed/performed Overall Cognitive Status: Within Functional Limits for tasks assessed                                     General Comments       Exercises     Shoulder Instructions      Home Living Family/patient expects to be discharged to:: Private residence Living Arrangements: Children Available Help at Discharge: Family Type of Home: House       Home Layout: One level         Bathroom Toilet: Standard         Additional Comments: walk in shower with chair , RW      Prior Functioning/Environment Level of Independence: Needs assistance        Comments: 2L O2 at home        OT Problem List: Decreased knowledge of use of DME or AE;Decreased safety awareness;Impaired  UE functional use      OT Treatment/Interventions: Self-care/ADL training;Energy conservation;DME and/or AE instruction;Patient/family education    OT Goals(Current goals can be found in the care plan section) Acute Rehab OT Goals Patient Stated Goal: Get home  OT Goal Formulation: With patient Time For Goal Achievement: 08/22/18 Potential to Achieve Goals: Good  OT Frequency: Min 1X/week   Barriers to D/C:            Co-evaluation PT/OT/SLP Co-Evaluation/Treatment: Yes Reason for Co-Treatment: For patient/therapist safety;To address functional/ADL transfers   OT goals addressed during session: ADL's and self-care      AM-PAC PT "6 Clicks" Daily Activity     Outcome Measure  OT GOALS :  1: Pt to have knowledge and demo use of AE to do LB dressing  1 wks  2: Pt to show good safety using walker during functional mobility in  ADL's and toiletting      1 wk           End of Session Equipment Utilized During Treatment: Gait belt;Rolling walker Nurse Communication: Mobility status  Activity Tolerance: Patient tolerated treatment well Patient left: in bed;with bed alarm set  OT Visit Diagnosis: Muscle weakness (generalized) (M62.81)                Time: 3382-5053 OT Time Calculation (min): 13 min Charges:  OT General Charges $OT Visit: 1 Visit OT Evaluation $OT Eval Low Complexity: 1 Low    Tracy Mcmahon OTR/L,CLT  08/15/2018, 1:00 PM

## 2018-08-15 NOTE — Progress Notes (Signed)
Family in and verbalize concerns of pt being/willing to provide adequate care for herself. POCT glucose checks stable. Skin issues due to pt scratching perineum with cleansing and education done.

## 2018-08-15 NOTE — Progress Notes (Signed)
[facility] [name]    MR#:  [id number]  DATE OF BIRTH:  1929/08/02  SUBJECTIVE:  CHIEF COMPLAINT:   Chief Complaint  Patient presents with  . Hypoglycemia   -Sugars are much improved.  Still feels very fatigued.  Urine cultures are pending  REVIEW OF SYSTEMS:  Review of Systems  Constitutional: Positive for malaise/fatigue. Negative for chills and fever.  HENT: Positive for hearing loss. Negative for congestion, nosebleeds and sinus pain.   Eyes: Negative for blurred vision and double vision.  Respiratory: Negative for cough, shortness of breath and wheezing.   Cardiovascular: Negative for chest pain and palpitations.  Gastrointestinal: Negative for abdominal pain, constipation, diarrhea, nausea and vomiting.  Genitourinary: Negative for dysuria.  Musculoskeletal: Negative for myalgias.  Neurological: Positive for weakness. Negative for dizziness, speech change, focal weakness, seizures and headaches.  Psychiatric/Behavioral: Negative for depression.    DRUG ALLERGIES:   Allergies  Allergen Reactions  . Fosamax [Alendronate Sodium] Other (See Comments)    Reaction:  Made pt "very sick"  . Wellbutrin [Bupropion] Other (See Comments)    Reaction:  Unknown    VITALS:  Blood pressure 133/64, pulse 86, temperature 99.7 F (37.6 C), temperature source Oral, resp. rate 20, height 5' (1.524 m), weight 82.5 kg, SpO2 95 %.  PHYSICAL EXAMINATION:  Physical Exam  GENERAL:  84 y.o.-year-old elderly patient lying in the bed with no acute distress.  EYES: Pupils equal, round, reactive to light and accommodation. No scleral icterus. Extraocular muscles intact.  HEENT: Head atraumatic, normocephalic. Oropharynx and nasopharynx clear.  NECK:  Supple, no jugular venous distention. No thyroid enlargement, no tenderness.  LUNGS: Normal breath sounds bilaterally, no wheezing, rales,rhonchi or crepitation. No use of accessory  muscles of respiration. Decreased bibasilar breath sounds CARDIOVASCULAR: S1, S2 normal. No  rubs, or gallops. 2/6 systolic murmur present ABDOMEN: Soft, nontender, nondistended. Bowel sounds present. No organomegaly or mass.  EXTREMITIES: No pedal edema, cyanosis, or clubbing.  NEUROLOGIC: Cranial nerves II through XII are intact. Muscle strength 5/5 in all extremities. Sensation intact. Gait not checked.  PSYCHIATRIC: The patient is alert and oriented x 3.  SKIN: No obvious rash, lesion, or ulcer.    LABORATORY PANEL:   CBC Recent Labs  Lab 08/15/18 0450  WBC 10.5  HGB 9.4*  HCT 28.7*  PLT 337   ------------------------------------------------------------------------------------------------------------------  Chemistries  Recent Labs  Lab 08/14/18 1653 08/15/18 0450  NA 141 142  K 5.3* 4.8  CL 102 103  CO2 30 31  GLUCOSE 141* 115*  BUN 19 20  CREATININE 1.21* 1.25*  CALCIUM 8.5* 8.6*  AST 20  --   ALT 10  --   ALKPHOS 55  --   BILITOT 0.4  --    ------------------------------------------------------------------------------------------------------------------  Cardiac Enzymes Recent Labs  Lab 08/14/18 1653  TROPONINI <0.03   ------------------------------------------------------------------------------------------------------------------  RADIOLOGY:  Dg Chest 2 View  Result Date: 08/14/2018 CLINICAL DATA:  Cough EXAM: CHEST - 2 VIEW COMPARISON:  06/26/2018, 04/30/2018, 08/10/2016 FINDINGS: Small left greater than right pleural effusion. Cardiomegaly. Patchy opacity at the peripheral left lung base. Aortic atherosclerosis. No pneumothorax. IMPRESSION: 1. Cardiomegaly. 2. Suspected small pleural effusion, left greater than right superimposed on chronic pleural changes. Patchy airspace disease is noted at the peripheral left lung base. Electronically Signed   By: Donavan Foil M.D.   On: 08/14/2018 17:47    EKG:   Orders placed or performed during the hospital  encounter of 08/14/18  . ED EKG  . ED EKG  . EKG 12-Lead  . EKG 12-Lead    ASSESSMENT AND PLAN:   Tracy Mcmahon a88 y.o.femalewith a known history of COPD not on home oxygen, hypertension, osteoarthritis, neuropathy, CKD stage III, diabetes mellitus brought in from home secondary to weakness and lethargy.  1. acute cystitis-recurrent admissions for UTI -Urine cultures are growinggram-negative rods -Blood cultures are negative - on Rocephin -Physical therapy consulted  2. Diabetes mellitus-hypoglycemia.  Known history of hypoglycemic episodes during infection.  Lantus was adjusted last admission.  Currently off 10 units of Lantus now. -Continue sliding scale insulin.  Off of any D5 fluids -Monitor closely.  3. History of DVT-on Eliquis, continue  4. Hypertension-on Norvasc, atenolol  6. Depression-on Prozac.  7. COPD- stable, stable for now - monitor, nebs, chronic daily prednisone - prn o2- uses nocturnal home o2   Physical therapy pending. -Daughter at bedside, updated.  Patient lives at home with this daughter.    All the records are reviewed and case discussed with Care Management/Social Workerr. Management plans discussed with the patient, family and they are in agreement.  CODE STATUS: Partial code  TOTAL TIME TAKING CARE OF THIS PATIENT: 36 minutes.   POSSIBLE D/C IN 1-2 DAYS, DEPENDING ON CLINICAL CONDITION.   Tracy Mcmahon M.D on 08/15/2018 at 1:08 PM  Between 7am to 6pm - Pager - [phone]  After 6pm go to [email or web address] - password [email or web address] [facility]  Office  [phone]  CC: Primary care physician; [name], MD

## 2018-08-15 NOTE — Evaluation (Signed)
Physical Therapy Evaluation Patient Details [name] MRN: [id number] DOB: [birth year] Today's Date: 08/15/2018   History of Present Illness  Samamtha Mcmahon  is a 84 y.o. female with a known history of HTN, T2DM, HLD, CKD III, COPD, polymyalgia rheumatica, hx of renal cell cancer who presented to the ED with hypoglycemia. Her family has difficulty taking care of her at home. She was in the recliner this morning and her daughter could not get her up because she has foot problems. Her daughter left to go to a doctor's appointment in [place] and when she returned, the patient was still in the recliner and seemed "out of it". Daughter called EMS and they checked her blood sugar, which was 30. She was given orange juice and was brought to the ED. She denies dysuria and urinary urgency. She endorses frequency, malodorous urine, and suprapubic pain over the last few days.  Clinical Impression  Co-treatment with OT, pt was laying in bed when session began. She is A&O x 4 and currently denies any pain. She is able to perform bed mobility with Min A with verbal cues for form, and maintain sitting balance on EOB with no LOB. bil LE strenght is WFL. She ambulated in the room ~30 ft and was able to use the get onto/ off of the commode with VC for proper transfer. No pain during evaluation. She would benefit from physical therapy to build endurance and increase balance/ stability. She plans to return home and  would benefit from continued physical therapy following discharge from [facility].     Follow Up Recommendations Home health PT    Equipment Recommendations  None recommended by PT(pt has a RW at home)    Recommendations for Other Services       Precautions / Restrictions Precautions Precautions: Fall Restrictions Weight Bearing Restrictions: No      Mobility  Bed Mobility               General bed mobility comments: Min A   Transfers Overall transfer level: (To bathroom CG but no  LOB - but v/c to use RW safely) Equipment used: Rolling walker (2 wheeled)             General transfer comment: amb to the bathrom and was able sit/ stand to and from commode with CG for safety  Ambulation/Gait Ambulation/Gait assistance: Supervision Gait Distance (Feet): 30 Feet(in the room) Assistive device: Rolling walker (2 wheeled) Gait Pattern/deviations: Step-through pattern;Shuffle;Trunk flexed     General Gait Details: verbal cues to avoid letting the walker lead her, to stay in the RW  Stairs            Wheelchair Mobility    Modified Rankin (Stroke Patients Only)       Balance Overall balance assessment: Independent(sitting on EOB with no postural sway)                                           Pertinent Vitals/Pain Pain Assessment: No/denies pain    Home Living Family/patient expects to be discharged to:: Private residence Living Arrangements: Children Available Help at Discharge: Family Type of Home: House Home Access: Stairs to enter Entrance Stairs-Rails: Left Entrance Stairs-Number of Steps: 2 Home Layout: One level   Additional Comments: walk in shower with chair , RW    Prior Function Level of Independence: Needs assistance  Comments: 2L O2 at home     Hand Dominance        Extremity/Trunk Assessment   Upper Extremity Assessment Upper Extremity Assessment: (bilateral shoulder AROM to 80-90 degrees- but can reach functionally with elbow  and good grip )    Lower Extremity Assessment Lower Extremity Assessment: RLE deficits/detail;LLE deficits/detail RLE Deficits / Details: overall 4/5 LLE Deficits / Details: overall 4/5       Communication   Communication: No difficulties  Cognition Arousal/Alertness: Awake/alert Behavior During Therapy: WFL for tasks assessed/performed Overall Cognitive Status: Within Functional Limits for tasks assessed                                         General Comments      Exercises Other Exercises Other Exercises: LAQ 2 x 10, seated marching 2 x 10   Assessment/Plan    PT Assessment Patient needs continued PT services  PT Problem List Decreased balance;Decreased activity tolerance       PT Treatment Interventions Stair training;Gait training;Functional mobility training;Therapeutic activities;Therapeutic exercise;Balance training;Patient/family education    PT Goals (Current goals can be found in the Care Plan section)  Acute Rehab PT Goals Patient Stated Goal: to go home PT Goal Formulation: With patient Time For Goal Achievement: 08/29/18 Potential to Achieve Goals: Good    Frequency Min 2X/week   Barriers to discharge        Co-evaluation PT/OT/SLP Co-Evaluation/Treatment: Yes Reason for Co-Treatment: For patient/therapist safety;To address functional/ADL transfers PT goals addressed during session: Mobility/safety with mobility OT goals addressed during session: ADL's and self-care       AM-PAC PT "6 Clicks" Daily Activity  Outcome Measure Difficulty turning over in bed (including adjusting bedclothes, sheets and blankets)?: A Little Difficulty moving from lying on back to sitting on the side of the bed? : A Little Difficulty sitting down on and standing up from a chair with arms (e.g., wheelchair, bedside commode, etc,.)?: A Little Help needed moving to and from a bed to chair (including a wheelchair)?: A Little Help needed walking in hospital room?: A Lot Help needed climbing 3-5 steps with a railing? : Total 6 Click Score: 15    End of Session Equipment Utilized During Treatment: Gait belt(RW) Activity Tolerance: Patient tolerated treatment well Patient left: in bed;with bed alarm set Nurse Communication: Mobility status PT Visit Diagnosis: Unsteadiness on feet (R26.81);Repeated falls (R29.6)    Time: 7353-2992 PT Time Calculation (min) (ACUTE ONLY): 15 min   Charges:   PT Evaluation $PT  Eval Low Complexity: 1 Low PT Treatments $Gait Training: 8-22 mins        Ruel Dimmick PT, DPT, LAT, ATC  08/15/18  1:21 PM       Ilham Roughton 08/15/2018, 1:18 PM

## 2018-08-16 DIAGNOSIS — E162 Hypoglycemia, unspecified: Secondary | ICD-10-CM | POA: Diagnosis not present

## 2018-08-16 DIAGNOSIS — N390 Urinary tract infection, site not specified: Secondary | ICD-10-CM | POA: Diagnosis not present

## 2018-08-16 DIAGNOSIS — E785 Hyperlipidemia, unspecified: Secondary | ICD-10-CM | POA: Diagnosis not present

## 2018-08-16 DIAGNOSIS — I10 Essential (primary) hypertension: Secondary | ICD-10-CM | POA: Diagnosis not present

## 2018-08-16 LAB — GLUCOSE, CAPILLARY
GLUCOSE-CAPILLARY: 135 mg/dL — AB (ref 70–99)
GLUCOSE-CAPILLARY: 180 mg/dL — AB (ref 70–99)

## 2018-08-16 MED ORDER — LEVOFLOXACIN 250 MG PO TABS: 250.0000 mg | ORAL_TABLET | Freq: Every day | ORAL | 0 refills | Status: AC

## 2018-08-16 MED ORDER — INSULIN ASPART 100 UNIT/ML ~~LOC~~ SOLN: 0.0000 [IU] | Freq: Three times a day (TID) | SUBCUTANEOUS | 11 refills | Status: DC

## 2018-08-16 NOTE — Progress Notes (Signed)
Care mgt referral completed. Dgt and pt ready for discharge home to self/family care with [facility] services. Transport chair used to transport pt to private vehicle for discharge home.

## 2018-08-16 NOTE — Discharge Summary (Signed)
[facility] [name]    MR#:  [id number]  DATE OF BIRTH:  10/18/1929  DATE OF ADMISSION:  08/14/2018   ADMITTING PHYSICIAN: Sela Hua, MD  DATE OF DISCHARGE:  08/16/18  PRIMARY CARE PHYSICIAN: [name], MD   ADMISSION DIAGNOSIS:   Hypoglycemia  DISCHARGE DIAGNOSIS:   Active Problems:   Hypoglycemia   Pressure injury of skin   SECONDARY DIAGNOSIS:   Past Medical History:  Diagnosis Date  . Cancer ([place])   . Cataract   . Chronic kidney disease (CKD), stage III (moderate) ([facility])   . Chronic urticaria   . COPD (chronic obstructive pulmonary disease) ([place])   . Depression   . Diabetes mellitus without complication ([place])   . DVT (deep venous thrombosis) ([facility])    left leg  . Hyperlipemia   . Hypertension   . Osteoarthritis   . Osteopenia   . Polymyalgia rheumatica ([place])   . Polyneuropathy   . Renal cancer ([place])   . Renal insufficiency 08/10/2016   Chart indicates CKD stage 3  . Restless leg syndrome   . Vitamin D deficiency   . Vulvar intraepithelial neoplasia with lichen sclerosus     HOSPITAL COURSE:   Tracy Mcmahon a84 y.o.femalewith a known history of COPD not on home oxygen, hypertension, osteoarthritis, neuropathy, CKD stage III, diabetes mellitus brought in from home secondary to weakness and lethargy.  1. acute cystitis-recurrent admissions for UTI -Urine cultures are growingsignificant colonies of gram-negative rods.  Received Rocephin in the hospital.  Being discharged on oral Levaquin -Blood cultures are negative - 2. Diabetes mellitus-hypoglycemia.  Known history of hypoglycemic episodes during infection.  -Patient was only on 10 units of Lantus at home as it was dose adjusted last admission.  Will discontinue Lantus at discharge.  Continue NovoLog based on sliding scale insulin.  Instructions given to daughter at bedside. -Continue to check blood sugars  3. History of  DVT-on Eliquis, continue  4. Hypertension-on Norvasc, atenolol and low-dose Lasix  6. Depression-on Prozac.  7. COPD- stable, stable for now - monitor, nebs, chronic daily prednisone - prn o2- uses nocturnal home o2   Physical therapy  recommended home health.  Patient will be discharged today  DISCHARGE CONDITIONS:   None  CONSULTS OBTAINED:   None  DRUG ALLERGIES:   Allergies  Allergen Reactions  . Fosamax [Alendronate Sodium] Other (See Comments)    Reaction:  Made pt "very sick"  . Wellbutrin [Bupropion] Other (See Comments)    Reaction:  Unknown   DISCHARGE MEDICATIONS:   Allergies as of 08/16/2018      Reactions   Fosamax [alendronate Sodium] Other (See Comments)   Reaction:  Made pt "very sick"   Wellbutrin [bupropion] Other (See Comments)   Reaction:  Unknown      Medication List    STOP taking these medications   insulin glargine 100 UNIT/ML injection Commonly known as:  LANTUS     TAKE these medications   albuterol (2.5 MG/3ML) 0.083% nebulizer solution Commonly known as:  PROVENTIL Take 3 mLs (2.5 mg total) by nebulization every 4 (four) hours.   alendronate 35 MG tablet Commonly known as:  FOSAMAX Take 35 mg by mouth every 7 (seven) days. Take with a full glass of water on an empty stomach.   amLODipine 5 MG tablet Commonly known as:  NORVASC Take 1 tablet (5 mg total) by mouth daily.   apixaban 5 MG Tabs tablet  Commonly known as:  ELIQUIS Take 1 tablet (5 mg total) by mouth 2 (two) times daily.   aspirin EC 81 MG tablet Take 81 mg by mouth daily.   atenolol 25 MG tablet Commonly known as:  TENORMIN Take 25 mg by mouth 2 (two) times daily.   azelastine 0.05 % ophthalmic solution Commonly known as:  OPTIVAR Place 1 drop into both eyes 2 (two) times daily as needed for itching.   diphenhydramine-acetaminophen 25-500 MG Tabs tablet Commonly known as:  TYLENOL PM Take 1 tablet by mouth at bedtime as needed (sleep or pain).     docusate sodium 100 MG capsule Commonly known as:  COLACE Take 1 capsule (100 mg total) by mouth 2 (two) times daily.   FLUoxetine 20 MG capsule Commonly known as:  PROZAC Take 20 mg by mouth daily.   furosemide 20 MG tablet Commonly known as:  LASIX Take 20 mg by mouth daily.   gabapentin 300 MG capsule Commonly known as:  NEURONTIN Take 1 capsule (300MG ) by mouth every morning, 1 capsule (300MG ) every evening and 3 capsules (900MG ) at bedtime   GNP CAL MAG ZINC +D3 PO Take 1 tablet by mouth daily.   insulin aspart 100 UNIT/ML injection Commonly known as:  novoLOG Inject 0-9 Units into the skin 3 (three) times daily with meals. CBG 70 - 120: 0 units CBG 121 - 150: 1 unit CBG 151 - 200: 2 units CBG 201 - 250: 3 units CBG 251 - 300: 5 units CBG 301 - 350: 7 units CBG 351 - 400: 9 units CBG > 400: call MD What changed:  additional instructions   levofloxacin 250 MG tablet Commonly known as:  LEVAQUIN Take 1 tablet (250 mg total) by mouth daily for 5 days.   polyethylene glycol packet Commonly known as:  MIRALAX / GLYCOLAX Take 17 g by mouth daily as needed for mild constipation.   potassium chloride 20 MEQ packet Commonly known as:  KLOR-CON Take 20 mEq by mouth daily. While on lasix   pravastatin 20 MG tablet Commonly known as:  PRAVACHOL Take 20 mg by mouth every evening.   predniSONE 5 MG tablet Commonly known as:  DELTASONE Take 7.5 mg by mouth daily.   tiotropium 18 MCG inhalation capsule Commonly known as:  SPIRIVA Place 1 capsule (18 mcg total) into inhaler and inhale daily.   vitamin B-12 1000 MCG tablet Commonly known as:  CYANOCOBALAMIN Take 1,000 mcg by mouth daily.   Vitamin D (Ergocalciferol) 50000 units Caps capsule Commonly known as:  DRISDOL Take 50,000 Units by mouth every Monday.        DISCHARGE INSTRUCTIONS:   1. PCP f/u in 1-2 weeks  DIET:   Cardiac diet  ACTIVITY:   Activity as tolerated  OXYGEN:   Home Oxygen: No.   Oxygen Delivery: room air  DISCHARGE LOCATION:   home   If you experience worsening of your admission symptoms, develop shortness of breath, life threatening emergency, suicidal or homicidal thoughts you must seek medical attention immediately by calling 911 or calling your MD immediately  if symptoms less severe.  You Must read complete instructions/literature along with all the possible adverse reactions/side effects for all the Medicines you take and that have been prescribed to you. Take any new Medicines after you have completely understood and accpet all the possible adverse reactions/side effects.   Please note  You were cared for by a hospitalist during your hospital stay. If you have any questions about  your discharge medications or the care you received while you were in the hospital after you are discharged, you can call the unit and asked to speak with the hospitalist on call if the hospitalist that took care of you is not available. Once you are discharged, your primary care physician will handle any further medical issues. Please note that NO REFILLS for any discharge medications will be authorized once you are discharged, as it is imperative that you return to your primary care physician (or establish a relationship with a primary care physician if you do not have one) for your aftercare needs so that they can reassess your need for medications and monitor your lab values.    On the day of Discharge:  VITAL SIGNS:   Blood pressure (!) 131/58, pulse 69, temperature 98.2 F (36.8 C), temperature source Oral, resp. rate 18, height 5' (1.524 m), weight 82.5 kg, SpO2 98 %.  PHYSICAL EXAMINATION:   GENERAL:  84 y.o.-year-old elderly patient lying in the bed with no acute distress.  EYES: Pupils equal, round, reactive to light and accommodation. No scleral icterus. Extraocular muscles intact.  HEENT: Head atraumatic, normocephalic. Oropharynx and nasopharynx clear.  NECK:   Supple, no jugular venous distention. No thyroid enlargement, no tenderness.  LUNGS: Normal breath sounds bilaterally, no wheezing, rales,rhonchi or crepitation. No use of accessory muscles of respiration. Decreased bibasilar breath sounds CARDIOVASCULAR: S1, S2 normal. No  rubs, or gallops. 2/6 systolic murmur present ABDOMEN: Soft, nontender, nondistended. Bowel sounds present. No organomegaly or mass.  EXTREMITIES: No pedal edema, cyanosis, or clubbing.  NEUROLOGIC: Cranial nerves II through XII are intact. Muscle strength 5/5 in all extremities. Sensation intact. Gait not checked.  PSYCHIATRIC: The patient is alert and oriented x 3.  Mild cognitive impairment noted SKIN: No obvious rash, lesion, or ulcer.   DATA REVIEW:   CBC Recent Labs  Lab 08/15/18 0450  WBC 10.5  HGB 9.4*  HCT 28.7*  PLT 337    Chemistries  Recent Labs  Lab 08/14/18 1653 08/15/18 0450  NA 141 142  K 5.3* 4.8  CL 102 103  CO2 30 31  GLUCOSE 141* 115*  BUN 19 20  CREATININE 1.21* 1.25*  CALCIUM 8.5* 8.6*  AST 20  --   ALT 10  --   ALKPHOS 55  --   BILITOT 0.4  --      Microbiology Results  Results for orders placed or performed during the hospital encounter of 08/14/18  Urine culture     Status: Abnormal (Preliminary result)   Collection Time: 08/14/18  3:41 PM  Result Value Ref Range Status   Specimen Description   Final    URINE, RANDOM Performed at [facility], [address] [place], [place] [address]    Special Requests   Final    NONE Performed at [facility], [address] [place], [place] [address]    Culture (A)  Final    >=100,000 COLONIES/mL GRAM NEGATIVE RODS IDENTIFICATION AND SUSCEPTIBILITIES TO FOLLOW Performed at [facility], [place] [address] [place], [place] [address]    Report Status PENDING  Incomplete    RADIOLOGY:  No results found.   Management plans discussed with the patient, family and they are in agreement.  CODE STATUS:      Code Status Orders  (From admission, onward)         Start     Ordered   08/14/18 2246  Limited resuscitation (code)  Continuous  Question Answer Comment  In the event of cardiac or respiratory ARREST: Initiate Code Blue, Call Rapid Response Yes   In the event of cardiac or respiratory ARREST: Perform CPR Yes   In the event of cardiac or respiratory ARREST: Perform Intubation/Mechanical Ventilation No   In the event of cardiac or respiratory ARREST: Use NIPPV/BiPAp only if indicated Yes   In the event of cardiac or respiratory ARREST: Administer ACLS medications if indicated Yes   In the event of cardiac or respiratory ARREST: Perform Defibrillation or Cardioversion if indicated Yes      08/14/18 2245        Code Status History    Date Active Date Inactive Code Status Order ID Comments User Context   06/26/2018 1128 06/29/2018 2059 Full Code [id number]  Gladstone Lighter, MD Inpatient   04/30/2018 1446 05/03/2018 1507 Full Code [id number]  Saundra Shelling, MD Inpatient   03/15/2018 2153 03/17/2018 1803 Full Code [id number]  Lance Coon, MD Inpatient   12/17/2016 0125 12/19/2016 1856 Full Code [id number]  Theodoro Grist, MD Inpatient   08/11/2016 0324 08/11/2016 0937 Full Code [id number]  Lance Coon, MD Inpatient   03/29/2015 2115 03/30/2015 1555 Full Code [id number]  Nicholes Mango, MD Inpatient      TOTAL TIME TAKING CARE OF THIS PATIENT: 38 minutes.    Gladstone Lighter M.D on 08/16/2018 at 1:42 PM  Between 7am to 6pm - Pager - [phone]  After 6pm go to [email or web address] - [employer] [facility]  Office  [phone]  CC: Primary care physician; [name], MD   Note: This dictation was prepared with Dragon dictation along with smaller phrase technology. Any transcriptional errors that result from this process are unintentional.

## 2018-08-16 NOTE — Care Management (Addendum)
Patient for discharge home today.  Daughter says patient is beig seen by [facility]. She was open to [facility] and closed last week per liaison.  Patient and daughter would like same agency. [facility] accepted referral for RN PT OT. [facility] referral made

## 2018-08-16 NOTE — Discharge Instructions (Signed)
u Urinary Tract Infection, Adult A urinary tract infection (UTI) is an infection of any part of the urinary tract. The urinary tract includes the:  Kidneys.  Ureters.  Bladder.  Urethra.  These organs make, store, and get rid of pee (urine) in the body. Follow these instructions at home:  Take over-the-counter and prescription medicines only as told by your doctor.  If you were prescribed an antibiotic medicine, take it as told by your doctor. Do not stop taking the antibiotic even if you start to feel better.  Avoid the following drinks: ? Alcohol. ? Caffeine. ? Tea. ? Carbonated drinks.  Drink enough fluid to keep your pee clear or pale yellow.  Keep all follow-up visits as told by your doctor. This is important.  Make sure to: ? Empty your bladder often and completely. Do not to hold pee for long periods of time. ? Empty your bladder before and after sex. ? Wipe from front to back after a bowel movement if you are female. Use each tissue one time when you wipe. Contact a doctor if:  You have back pain.  You have a fever.  You feel sick to your stomach (nauseous).  You throw up (vomit).  Your symptoms do not get better after 3 days.  Your symptoms go away and then come back. Get help right away if:  You have very bad back pain.  You have very bad lower belly (abdominal) pain.  You are throwing up and cannot keep down any medicines or water. This information is not intended to replace advice given to you by your health care provider. Make sure you discuss any questions you have with your health care provider. Document Released: 04/29/2008 Document Revised: 04/18/2016 Document Reviewed: 10/02/2015 Elsevier Interactive Patient Education  [employer].

## 2018-08-16 NOTE — Progress Notes (Signed)
Oral and written AVS instructions given to dgt and pt with stated understanding. Blood sugars stable. No co's/concerns. Educated pt and dgt on importance of pericare/proper cleansing. [facility] services will be arranged by care mgt. One prescription for sliding scale insulin given to dgt and she verbalized/repeated back correctly. Will discharge home when care mgt completed.

## 2018-08-17 LAB — URINE CULTURE: Culture: >=100000 — AB

## 2018-08-17 LAB — GLUCOSE, CAPILLARY: GLUCOSE-CAPILLARY: 78 mg/dL (ref 70–99)

## 2018-08-18 ENCOUNTER — Other Ambulatory Visit: Payer: Self-pay | Admitting: *Deleted

## 2018-08-18 ENCOUNTER — Encounter: Payer: Self-pay | Admitting: *Deleted

## 2018-08-18 DIAGNOSIS — E119 Type 2 diabetes mellitus without complications: Secondary | ICD-10-CM | POA: Diagnosis not present

## 2018-08-18 DIAGNOSIS — F329 Major depressive disorder, single episode, unspecified: Secondary | ICD-10-CM | POA: Diagnosis not present

## 2018-08-18 DIAGNOSIS — J449 Chronic obstructive pulmonary disease, unspecified: Secondary | ICD-10-CM | POA: Diagnosis not present

## 2018-08-18 DIAGNOSIS — M353 Polymyalgia rheumatica: Secondary | ICD-10-CM | POA: Diagnosis not present

## 2018-08-18 DIAGNOSIS — L89152 Pressure ulcer of sacral region, stage 2: Secondary | ICD-10-CM | POA: Diagnosis not present

## 2018-08-18 DIAGNOSIS — E11649 Type 2 diabetes mellitus with hypoglycemia without coma: Secondary | ICD-10-CM | POA: Diagnosis not present

## 2018-08-18 DIAGNOSIS — N183 Chronic kidney disease, stage 3 (moderate): Secondary | ICD-10-CM | POA: Diagnosis not present

## 2018-08-18 DIAGNOSIS — N3000 Acute cystitis without hematuria: Secondary | ICD-10-CM | POA: Diagnosis not present

## 2018-08-18 DIAGNOSIS — I129 Hypertensive chronic kidney disease with stage 1 through stage 4 chronic kidney disease, or unspecified chronic kidney disease: Secondary | ICD-10-CM | POA: Diagnosis not present

## 2018-08-18 NOTE — Patient Outreach (Addendum)
[facility]) Care Management  08/18/2018  Tracy Mcmahon 08/01/1929 [id number]  Telephone assessment   Referral received 9/23 Referral source: [facility] Case Manager Referral reason : Admission 9/20-9/22, Dx. Hypoglycemia and UTI  Transition of care by PCP office - [facility]  Chart reviewed PMHx : includes but not limited to Hypertension , COPD with home oxygen, Depression , Diabetes.    Successful outreach call to patient , HIPAA confirmed explained reason for the call and [facility] care management services.   Patient discussed recent hospitalization for UTI and low blood sugars. Patient reports that she lives with her daughter and she handles everything. Patient gave verbal agreement that I may speak with her daughter [name].  Daughter discussed patient recent hospital admissions have been related to UTI, she states each time she has a UTI her blood sugar reads low. She states prior to that her blood sugars have been in good range with Lantus insulin. She discussed patient sees a special doctor( Gaetano Net, NP  at PCP office for her diabetes.   Daughter discussed that patient blood sugar was 159 on today, they sleep late so 1st meals is around 11 and then eat lunch at around 3 pm. Discussed symptoms of low blood sugar with daughter and steps to treating low blood sugar symptoms reviewed , daughter denies patient having low blood sugar reading or symptoms  since being at home.   Patient uses a walker for mobility in the home states she sleeps in her recliner at night. Patient daughter assist with ADL's at home. Patient reports tolerating taking a shower, she is able to do part of her bath then daughter assist, she has a shower chair. Daughter discussed it being difficult getting patient to take a shower, changes her pull ups more frequently to decrease risk of UTI.  Daughter discussed patient has had one fall in the last month,no injury .   COPD Daughter  discussed patient usually wears oxygen mostly at night at 2 liters, denies shortness of breath or increase in cough. She reports oxygen level during home health visit was in good range.     Daughter discussed patient having recent visit with PCP, and agrees to schedule post discharge visit.   Daughter reports [facility] RN visited on today.   Medications Patient daughter helps with managing patient medications using a pill organizer, she denies having cost concerns related to medications. Daughter verbalizes understanding of how to use sliding scale chart.  Patient has all medications prescribed at discharge.   Outpatient Encounter Medications as of 08/18/2018  Medication Sig  . albuterol (PROVENTIL) (2.5 MG/3ML) 0.083% nebulizer solution Take 3 mLs (2.5 mg total) by nebulization every 4 (four) hours.  Marland Kitchen alendronate (FOSAMAX) 35 MG tablet Take 35 mg by mouth every 7 (seven) days. Take with a full glass of water on an empty stomach.  Marland Kitchen amLODipine (NORVASC) 5 MG tablet Take 1 tablet (5 mg total) by mouth daily.  Marland Kitchen apixaban (ELIQUIS) 5 MG TABS tablet Take 1 tablet (5 mg total) by mouth 2 (two) times daily.  Marland Kitchen aspirin EC 81 MG tablet Take 81 mg by mouth daily.  Marland Kitchen atenolol (TENORMIN) 25 MG tablet Take 25 mg by mouth 2 (two) times daily.  Marland Kitchen azelastine (OPTIVAR) 0.05 % ophthalmic solution Place 1 drop into both eyes 2 (two) times daily as needed for itching.  . diphenhydramine-acetaminophen (TYLENOL PM) 25-500 MG TABS tablet Take 1 tablet by mouth at bedtime as needed (sleep  or pain).  Marland Kitchen docusate sodium (COLACE) 100 MG capsule Take 1 capsule (100 mg total) by mouth 2 (two) times daily.  Marland Kitchen FLUoxetine (PROZAC) 20 MG capsule Take 20 mg by mouth daily.  . furosemide (LASIX) 20 MG tablet Take 20 mg by mouth daily.  Marland Kitchen gabapentin (NEURONTIN) 300 MG capsule Take 1 capsule (300MG ) by mouth every morning, 1 capsule (300MG ) every evening and 3 capsules (900MG ) at bedtime  . insulin aspart (NOVOLOG) 100  UNIT/ML injection Inject 0-9 Units into the skin 3 (three) times daily with meals. CBG 70 - 120: 0 units CBG 121 - 150: 1 unit CBG 151 - 200: 2 units CBG 201 - 250: 3 units CBG 251 - 300: 5 units CBG 301 - 350: 7 units CBG 351 - 400: 9 units CBG > 400: call MD  . levofloxacin (LEVAQUIN) 250 MG tablet Take 1 tablet (250 mg total) by mouth daily for 5 days.  . Multiple Minerals-Vitamins (GNP CAL MAG ZINC +D3 PO) Take 1 tablet by mouth daily.  . polyethylene glycol (MIRALAX / GLYCOLAX) packet Take 17 g by mouth daily as needed for mild constipation.  . potassium chloride (KLOR-CON) 20 MEQ packet Take 20 mEq by mouth daily. While on lasix  . pravastatin (PRAVACHOL) 20 MG tablet Take 20 mg by mouth every evening.   . predniSONE (DELTASONE) 5 MG tablet Take 7.5 mg by mouth daily.   . vitamin B-12 (CYANOCOBALAMIN) 1000 MCG tablet Take 1,000 mcg by mouth daily.  . Vitamin D, Ergocalciferol, (DRISDOL) 50000 UNITS CAPS capsule Take 50,000 Units by mouth every Monday.   . tiotropium (SPIRIVA) 18 MCG inhalation capsule Place 1 capsule (18 mcg total) into inhaler and inhale daily. (Patient not taking: Reported on 08/14/2018)   No facility-administered encounter medications on file as of 08/18/2018.   Patient was recently discharged from hospital and all medications have been reviewed.  Assessment  Patient agreeable to [facility] care management services.   Plan  Provided [facility] care management contact information  Will send PCP barrier involvement letter  Will send patient welcome letter.  Will plan follow up call in the next month.  Provided patient daughter contact number for [employer] over the counter products  Fall prevention measures reviewed.    THN CM Care Plan Problem One     Most Recent Value  Care Plan Problem One  Recent hospital admission related to UIT, hypoglycemia at risk for readmission ,4 admits in last 4 months    Role Documenting the Problem One  Care Management [place] for  Problem One  Active  [facility] Long Term Goal   Patient will not experience a hospital admission in the next 31 days   THN Long Term Goal Start Date  08/18/18  Interventions for Problem One Long Term Goal  Review of discharge instructions encouraged adhering to instructions , medications .   THN CM Short Term Goal #1   Over the next 30 days patient/daugther will be able to state at least 3 sympotms of UTI and action plan   THN CM Short Term Goal #1 Start Date  08/18/18  Interventions for Short Term Goal #1  Reviewed with daughter signs of UTI, fever, urine foul odor, temperature, change in level of consciousness.,confusion , decrease in appetite   THN CM Short Term Goal #2   Over the next 30 days patient will be able to states 3 measures to reduce UTI infections   THN CM Short Term Goal #2 Start Date  08/18/18  Interventions for Short Term Goal #2  Discuss with patient importance of staying dry as possible, changing pull up when wet, get up to bathrom at least every 2 hours. Discussed how being wet for long periods increases change of infections.     THN CM Care Plan Problem Two     Most Recent Value  Care Plan Problem Two  Diabetes self care knowledge   Role Documenting the Problem Two  Care Management Assistant  Care Plan for Problem Two  Not Active  THN CM Short Term Goal #1   Patient will be able to report steps to treating low blood sugar over the next 30 days   THN CM Short Term Goal #1 Start Date  08/18/18  Interventions for Short Term Goal #2   Reviewed rule of 15, symptoms of hypoglycemia, range of normal blood sugars , and snack options for treating low blood sugar.     [name], RN, [place] Management Coordinator  [phone]- Mobile [phone]- Toll Free Main Office

## 2018-08-19 DIAGNOSIS — N3000 Acute cystitis without hematuria: Secondary | ICD-10-CM | POA: Diagnosis not present

## 2018-08-19 DIAGNOSIS — E11649 Type 2 diabetes mellitus with hypoglycemia without coma: Secondary | ICD-10-CM | POA: Diagnosis not present

## 2018-08-19 DIAGNOSIS — L89152 Pressure ulcer of sacral region, stage 2: Secondary | ICD-10-CM | POA: Diagnosis not present

## 2018-08-19 DIAGNOSIS — J449 Chronic obstructive pulmonary disease, unspecified: Secondary | ICD-10-CM | POA: Diagnosis not present

## 2018-08-19 DIAGNOSIS — M353 Polymyalgia rheumatica: Secondary | ICD-10-CM | POA: Diagnosis not present

## 2018-08-19 DIAGNOSIS — E119 Type 2 diabetes mellitus without complications: Secondary | ICD-10-CM | POA: Diagnosis not present

## 2018-08-19 DIAGNOSIS — F329 Major depressive disorder, single episode, unspecified: Secondary | ICD-10-CM | POA: Diagnosis not present

## 2018-08-19 DIAGNOSIS — I129 Hypertensive chronic kidney disease with stage 1 through stage 4 chronic kidney disease, or unspecified chronic kidney disease: Secondary | ICD-10-CM | POA: Diagnosis not present

## 2018-08-19 DIAGNOSIS — N183 Chronic kidney disease, stage 3 (moderate): Secondary | ICD-10-CM | POA: Diagnosis not present

## 2018-08-21 DIAGNOSIS — E119 Type 2 diabetes mellitus without complications: Secondary | ICD-10-CM | POA: Diagnosis not present

## 2018-08-21 DIAGNOSIS — N183 Chronic kidney disease, stage 3 (moderate): Secondary | ICD-10-CM | POA: Diagnosis not present

## 2018-08-21 DIAGNOSIS — N3000 Acute cystitis without hematuria: Secondary | ICD-10-CM | POA: Diagnosis not present

## 2018-08-21 DIAGNOSIS — I129 Hypertensive chronic kidney disease with stage 1 through stage 4 chronic kidney disease, or unspecified chronic kidney disease: Secondary | ICD-10-CM | POA: Diagnosis not present

## 2018-08-21 DIAGNOSIS — M353 Polymyalgia rheumatica: Secondary | ICD-10-CM | POA: Diagnosis not present

## 2018-08-21 DIAGNOSIS — F329 Major depressive disorder, single episode, unspecified: Secondary | ICD-10-CM | POA: Diagnosis not present

## 2018-08-21 DIAGNOSIS — J449 Chronic obstructive pulmonary disease, unspecified: Secondary | ICD-10-CM | POA: Diagnosis not present

## 2018-08-21 DIAGNOSIS — E11649 Type 2 diabetes mellitus with hypoglycemia without coma: Secondary | ICD-10-CM | POA: Diagnosis not present

## 2018-08-21 DIAGNOSIS — L89152 Pressure ulcer of sacral region, stage 2: Secondary | ICD-10-CM | POA: Diagnosis not present

## 2018-08-25 DIAGNOSIS — M353 Polymyalgia rheumatica: Secondary | ICD-10-CM | POA: Diagnosis not present

## 2018-08-25 DIAGNOSIS — E119 Type 2 diabetes mellitus without complications: Secondary | ICD-10-CM | POA: Diagnosis not present

## 2018-08-25 DIAGNOSIS — F329 Major depressive disorder, single episode, unspecified: Secondary | ICD-10-CM | POA: Diagnosis not present

## 2018-08-25 DIAGNOSIS — N3000 Acute cystitis without hematuria: Secondary | ICD-10-CM | POA: Diagnosis not present

## 2018-08-25 DIAGNOSIS — L89152 Pressure ulcer of sacral region, stage 2: Secondary | ICD-10-CM | POA: Diagnosis not present

## 2018-08-25 DIAGNOSIS — N183 Chronic kidney disease, stage 3 (moderate): Secondary | ICD-10-CM | POA: Diagnosis not present

## 2018-08-25 DIAGNOSIS — E11649 Type 2 diabetes mellitus with hypoglycemia without coma: Secondary | ICD-10-CM | POA: Diagnosis not present

## 2018-08-25 DIAGNOSIS — J449 Chronic obstructive pulmonary disease, unspecified: Secondary | ICD-10-CM | POA: Diagnosis not present

## 2018-08-25 DIAGNOSIS — I129 Hypertensive chronic kidney disease with stage 1 through stage 4 chronic kidney disease, or unspecified chronic kidney disease: Secondary | ICD-10-CM | POA: Diagnosis not present

## 2018-08-27 DIAGNOSIS — E1159 Type 2 diabetes mellitus with other circulatory complications: Secondary | ICD-10-CM | POA: Diagnosis not present

## 2018-08-27 DIAGNOSIS — J9611 Chronic respiratory failure with hypoxia: Secondary | ICD-10-CM | POA: Diagnosis not present

## 2018-08-27 DIAGNOSIS — Z87440 Personal history of urinary (tract) infections: Secondary | ICD-10-CM | POA: Diagnosis not present

## 2018-08-27 DIAGNOSIS — Z23 Encounter for immunization: Secondary | ICD-10-CM | POA: Diagnosis not present

## 2018-08-27 DIAGNOSIS — E559 Vitamin D deficiency, unspecified: Secondary | ICD-10-CM | POA: Diagnosis not present

## 2018-08-27 DIAGNOSIS — E1122 Type 2 diabetes mellitus with diabetic chronic kidney disease: Secondary | ICD-10-CM | POA: Diagnosis not present

## 2018-08-27 DIAGNOSIS — J439 Emphysema, unspecified: Secondary | ICD-10-CM | POA: Diagnosis not present

## 2018-08-27 DIAGNOSIS — M353 Polymyalgia rheumatica: Secondary | ICD-10-CM | POA: Diagnosis not present

## 2018-08-27 DIAGNOSIS — N183 Chronic kidney disease, stage 3 (moderate): Secondary | ICD-10-CM | POA: Diagnosis not present

## 2018-08-28 DIAGNOSIS — N183 Chronic kidney disease, stage 3 (moderate): Secondary | ICD-10-CM | POA: Diagnosis not present

## 2018-08-28 DIAGNOSIS — M353 Polymyalgia rheumatica: Secondary | ICD-10-CM | POA: Diagnosis not present

## 2018-08-28 DIAGNOSIS — F329 Major depressive disorder, single episode, unspecified: Secondary | ICD-10-CM | POA: Diagnosis not present

## 2018-08-28 DIAGNOSIS — E11649 Type 2 diabetes mellitus with hypoglycemia without coma: Secondary | ICD-10-CM | POA: Diagnosis not present

## 2018-08-28 DIAGNOSIS — E119 Type 2 diabetes mellitus without complications: Secondary | ICD-10-CM | POA: Diagnosis not present

## 2018-08-28 DIAGNOSIS — I129 Hypertensive chronic kidney disease with stage 1 through stage 4 chronic kidney disease, or unspecified chronic kidney disease: Secondary | ICD-10-CM | POA: Diagnosis not present

## 2018-08-28 DIAGNOSIS — L89152 Pressure ulcer of sacral region, stage 2: Secondary | ICD-10-CM | POA: Diagnosis not present

## 2018-08-28 DIAGNOSIS — N3000 Acute cystitis without hematuria: Secondary | ICD-10-CM | POA: Diagnosis not present

## 2018-08-28 DIAGNOSIS — J449 Chronic obstructive pulmonary disease, unspecified: Secondary | ICD-10-CM | POA: Diagnosis not present

## 2018-09-02 ENCOUNTER — Encounter: Payer: Self-pay | Admitting: *Deleted

## 2018-09-02 ENCOUNTER — Other Ambulatory Visit: Payer: Self-pay | Admitting: *Deleted

## 2018-09-02 NOTE — Patient Outreach (Signed)
[facility]) Care Management  09/02/2018  Tracy Mcmahon [birth year] [id number]   Telephone assessment    Referral received 9/23 Referral source: [facility] Case Manager Referral reason : Admission 9/20-9/22, Dx. Hypoglycemia and UTI  Transition of care by PCP office - [facility]  Chart reviewed PMHx : includes but not limited to Hypertension , COPD with home oxygen, Depression , Diabetes.    Successful telephone outreach to patient daughter Tracy Mcmahon , HIPAA confirmed x 2 identifiers.  Daughter discussed that patient is doing much better, no signs symptoms of UTI . Patient tolerating food and fluids without problems . Patient has completed PCP and adjustment to Diabetes plan, restarted on Lantus. Daughter discussed blood sugar reading of 342 on yesterday, reading today of 189. Daughter states she continues to assist with medications daily.   Daughter discussed she continues to struggle with patient getting up to bathroom to urinate at least every 2 hours during the day, she denies mobility as being the problem, but she just won't get up .  Patient has follow up visit in the next week with urologist and with NP regarding diabetes .   Plan  Will schedule initial home visit in the next week.  Will continue to provide education and support of UTI prevention.   [name], RN, [place] Management Coordinator  [phone]- Mobile [phone]- Toll Free Main Office

## 2018-09-03 DIAGNOSIS — J449 Chronic obstructive pulmonary disease, unspecified: Secondary | ICD-10-CM | POA: Diagnosis not present

## 2018-09-03 DIAGNOSIS — E11649 Type 2 diabetes mellitus with hypoglycemia without coma: Secondary | ICD-10-CM | POA: Diagnosis not present

## 2018-09-03 DIAGNOSIS — F329 Major depressive disorder, single episode, unspecified: Secondary | ICD-10-CM | POA: Diagnosis not present

## 2018-09-03 DIAGNOSIS — I129 Hypertensive chronic kidney disease with stage 1 through stage 4 chronic kidney disease, or unspecified chronic kidney disease: Secondary | ICD-10-CM | POA: Diagnosis not present

## 2018-09-03 DIAGNOSIS — N3000 Acute cystitis without hematuria: Secondary | ICD-10-CM | POA: Diagnosis not present

## 2018-09-03 DIAGNOSIS — M353 Polymyalgia rheumatica: Secondary | ICD-10-CM | POA: Diagnosis not present

## 2018-09-03 DIAGNOSIS — E119 Type 2 diabetes mellitus without complications: Secondary | ICD-10-CM | POA: Diagnosis not present

## 2018-09-03 DIAGNOSIS — N183 Chronic kidney disease, stage 3 (moderate): Secondary | ICD-10-CM | POA: Diagnosis not present

## 2018-09-03 DIAGNOSIS — L89152 Pressure ulcer of sacral region, stage 2: Secondary | ICD-10-CM | POA: Diagnosis not present

## 2018-09-07 ENCOUNTER — Other Ambulatory Visit: Payer: Self-pay | Admitting: *Deleted

## 2018-09-07 ENCOUNTER — Encounter: Payer: Self-pay | Admitting: *Deleted

## 2018-09-07 NOTE — Patient Outreach (Signed)
Tracy Mcmahon   09/08/2018  Tracy Mcmahon 05-02-1929 [id number]  Tracy Mcmahon is an 84 y.o. female   Initial home visit     Referral received 9/23 Referral source: Tracy Mcmahon Case Manager Referral reason : Admission 9/20-9/22, Dx. Hypoglycemia and UTI  Transition of care by PCP office - Tracy Mcmahon   PMHx : includes but not limited to Hypertension , COPD with home oxygen, Depression , Diabetes.   Subjective:  Patient reports feeling  pretty good today.  Patient discussed that she does pretty good getting up from the chair to walk to the bathroom using her walker but admits to not doing it as frequent as she should.  Daughter discussed patient blood sugars usually run in a good range except when she has a UTI her blood sugars run lower, then she is taken off Lantus.  Daughter states patient is back on Lantus insulin now but it takes a few days to get her blood sugar back under control,she discussed having follow up visit tomorrow with Tracy Mcmahon that manages diabetes.   Objective:  BP 134/78 (BP Location: Right Arm, Patient Position: Sitting, Cuff Size: Normal)   Pulse 63   Resp 18   Ht 1.524 m (5')   Wt 185 lb (83.9 kg)   LMP  (LMP Unknown)   SpO2 95%   BMI 36.13 kg/m  Review of Systems  Constitutional: Negative.   HENT: Negative.   Eyes: Negative.   Respiratory: Negative.   Cardiovascular: Positive for leg swelling.       Slight left lower leg   Gastrointestinal: Negative.   Genitourinary: Negative.   Musculoskeletal: Negative.   Skin: Negative.   Neurological: Negative.   Endo/Heme/Allergies: Negative.   Psychiatric/Behavioral: Negative.     Physical Exam  Constitutional: She is oriented to person, place, and time. She appears well-developed and well-nourished.  Cardiovascular: Normal rate and normal heart sounds.  Respiratory: Effort normal and breath sounds normal.  GI: Soft. Bowel sounds are normal.  Neurological: She is  alert and oriented to person, place, and time.  Skin: Skin is warm and dry.  Psychiatric: She has a normal mood and affect. Her behavior is normal. Judgment and thought content normal.    Encounter Medications:   Outpatient Encounter Medications as of 09/07/2018  Medication Sig  . albuterol (PROVENTIL) (2.5 MG/3ML) 0.083% nebulizer solution Take 3 mLs (2.5 mg total) by nebulization every 4 (four) hours.  Marland Kitchen alendronate (FOSAMAX) 35 MG tablet Take 35 mg by mouth every 7 (seven) days. Take with a full glass of water on an empty stomach.  Marland Kitchen amLODipine (NORVASC) 5 MG tablet Take 1 tablet (5 mg total) by mouth daily.  Marland Kitchen apixaban (ELIQUIS) 5 MG TABS tablet Take 1 tablet (5 mg total) by mouth 2 (two) times daily.  Marland Kitchen aspirin EC 81 MG tablet Take 81 mg by mouth daily.  Marland Kitchen atenolol (TENORMIN) 25 MG tablet Take 25 mg by mouth 2 (two) times daily.  . Calcium Carb-Cholecalciferol (CALCIUM 600/VITAMIN D3) 600-800 MG-UNIT TABS Take 600 mg by mouth daily.  . diphenhydramine-acetaminophen (TYLENOL PM) 25-500 MG TABS tablet Take 1 tablet by mouth at bedtime as needed (sleep or pain).  Marland Kitchen docusate sodium (COLACE) 100 MG capsule Take 1 capsule (100 mg total) by mouth 2 (two) times daily.  Marland Kitchen FLUoxetine (PROZAC) 20 MG capsule Take 20 mg by mouth daily.  . furosemide (LASIX) 20 MG tablet Take 20 mg by mouth daily.  Marland Kitchen gabapentin (NEURONTIN)  300 MG capsule Take 1 capsule (300MG ) by mouth every morning, 1 capsule (300MG ) every evening and 3 capsules (900MG ) at bedtime  . insulin aspart (NOVOLOG) 100 UNIT/ML injection Inject 0-9 Units into the skin 3 (three) times daily with meals. CBG 70 - 120: 0 units CBG 121 - 150: 1 unit CBG 151 - 200: 2 units CBG 201 - 250: 3 units CBG 251 - 300: 5 units CBG 301 - 350: 7 units CBG 351 - 400: 9 units CBG > 400: call MD  . Multiple Minerals-Vitamins (GNP CAL MAG ZINC +D3 PO) Take 1 tablet by mouth daily.  . polyethylene glycol (MIRALAX / GLYCOLAX) packet Take 17 g by mouth daily  as needed for mild constipation.  . potassium chloride (KLOR-CON) 20 MEQ packet Take 20 mEq by mouth daily. While on lasix  . pravastatin (PRAVACHOL) 20 MG tablet Take 20 mg by mouth every evening.   . predniSONE (DELTASONE) 5 MG tablet Take 7.5 mg by mouth daily.   Marland Kitchen tiotropium (SPIRIVA) 18 MCG inhalation capsule Place 1 capsule (18 mcg total) into inhaler and inhale daily.  . vitamin B-12 (CYANOCOBALAMIN) 1000 MCG tablet Take 1,000 mcg by mouth daily.  . Vitamin D, Ergocalciferol, (DRISDOL) 50000 UNITS CAPS capsule Take 50,000 Units by mouth every Monday.   Marland Kitchen azelastine (OPTIVAR) 0.05 % ophthalmic solution Place 1 drop into both eyes 2 (two) times daily as needed for itching.   No facility-administered encounter medications on file as of 09/07/2018.     Functional Status:   In your present state of health, do you have any difficulty performing the following activities: 08/18/2018 08/14/2018  Hearing? [name]  Vision? N N  Difficulty concentrating or making decisions? N N  Walking or climbing stairs? Y Y  Dressing or bathing? [name]  Comment daughter helps  -  Doing errands, shopping? Y N  Comment family helps  -  Conservation officer, nature and eating ? Y -  Comment family prepares meals  -  Using the Toilet? Y -  In the past six months, have you accidently leaked urine? Y -  Do you have problems with loss of bowel control? N -  Managing your Medications? Y -  Managing your Finances? Y -  Comment carol helps  -  Housekeeping or managing your Housekeeping? Y -  Comment daughter helps  -  Some recent data might be hidden    Fall/Depression Screening:    Fall Risk  08/18/2018 08/18/2018  Falls in the past year? Yes No  Number falls in past yr: 2 or more -  Injury with Fall? No -  Risk Factor Category  High Fall Risk -  Risk for fall due to : - Impaired balance/gait  Follow up Falls prevention discussed -   PHQ 2/9 Scores 08/18/2018  PHQ - 2 Score 1    Assessment:  Initial home visit , Patient  daughter and son in law present at visit.  Daughter  discussed patient is still followed by home health RN, reports she did not need therapy at this time due to she has recently completed home health therapy and is doing better.   UTI- No current symptoms on review .  needs reinforcement with steps of reduce UTI, Tolerating fluids and liquids.  Diabetes-blood sugar 207 this morning , patient daughter assist with monitoring and insulin administration Daughter discussed patient blood sugar range has been 180 to 300 .Unable to determine average blood sugar patient daughter is using 2 different monitors,  one belongs to her and one belongs to the patient. Daughter is not keeping a written record of readings . Patient has a  Accu check meter and patient daughter has [facility] meter . Daughter discussed patient has had CBG meter for many years.  Patient will benefit from new monitor, Discussed with daughter cost saving of getting CBG meter strips and lancets prescription sent to [facility] mail order pharmacy for 90 day supply , Daughter to discuss with Gaetano Net, Tracy Mcmahon to visit on 10/15. Marland Kitchen  COPD Wearing oxygen at 2 liters mostly as needed, she does not use Spiriva on a daily basis.  Reviewed for worsening signs of COPD , denies increase shortness of breath, cough or sputum production. Reinforced taking medication as prescribed.   Fall Risk- high fall risk , using rolling walker.  Medications - Discussed medication cost concern, daughter denies cost concerns at this time, she is unable to report insulin cost at this time.   Advanced Directives Patient does not have advanced directive in place, explained and will provide packet.    Plan:  Provided and reviewed welcome packet.  Will plan follow up call in the next 2 weeks  Reinforced fall precautions measures  Will send EMMI on UTI prevention , Preventing falls in elderly .  Provided Diabetes education booklet Will mail Advanced directive packet  Explained and  provided daughter with [place] OTC benefit.  Will send PCP visit note.  THN CM Care Plan Problem One     Most Recent Value  Care Plan Problem One  Recent hospital admission related to UIT, hypoglycemia at risk for readmission ,4 admits in last 4 months    Role Documenting the Problem One  Care Mcmahon [place] for Problem One  Active  THN Long Term Goal   Patient will not experience a hospital admission in the next 31 days   THN Long Term Goal Start Date  08/18/18  Interventions for Problem One Long Term Goal  Home visit, stressed the importance of notifying MD sooner for new concerns , UTI symptoms, low blood sugar readings to arrange office visit.   THN CM Short Term Goal #1   Over the next 30 days patient/daugther will be able to state at least 3 sympotms of UTI and action plan   THN CM Short Term Goal #1 Start Date  08/18/18  Interventions for Short Term Goal #1  Reviewed for signs of infection and discussed , teach back with daughter   [facility] CM Short Term Goal #2   Over the next 30 days patient will be able to states 3 measures to reduce UTI infections   THN CM Short Term Goal #2 Start Date  08/18/18  Interventions for Short Term Goal #2  discusssed measures to reduce UTI encouraged getting up to the bathroom at least every 2 hours during the day to uninate, reviewed with patient how keeping dry helps prevent risk of infection   THN CM Short Term Goal #3  Patient will attend all medical appointment in the next 30 days  THN CM Short Term Goal #3 Start Date  08/18/18  Interventions for Short Tern Goal #3  Discussed upcoming visit regarding DM and urology visit .     [facility] Mcmahon CM Care Plan Problem Two     Most Recent Value  Care Plan Problem Two  Diabetes self care knowledge   Role Documenting the Problem Two  Care Mcmahon Assistant  Care Plan for Problem Two  Active  THN CM Short Term Goal #1   Patient will be able to report steps to treating low blood sugar over the  next 30 days   THN CM Short Term Goal #1 Start Date  08/18/18  Interventions for Short Term Goal #2   Provided Diabetes education book and reviewed low blood sugar symptoms and treatment page.   THN CM Short Term Goal #2   Over the next 30 days patient/daughter will begin to monitor blood sugars at least twice daily and keep a record.  THN CM Short Term Goal #2 Start Date  09/02/18  Interventions for Short Term Goal #2  Encouraged daughter regarding the benefit of keeping written record of blood sugar to better identify blood sugar range.      [name], RN, [place] Mcmahon Coordinator  [phone]- Mobile [phone]- Toll Free Main Office

## 2018-09-08 DIAGNOSIS — N183 Chronic kidney disease, stage 3 (moderate): Secondary | ICD-10-CM | POA: Diagnosis not present

## 2018-09-08 DIAGNOSIS — Z79899 Other long term (current) drug therapy: Secondary | ICD-10-CM | POA: Diagnosis not present

## 2018-09-08 DIAGNOSIS — Z794 Long term (current) use of insulin: Secondary | ICD-10-CM | POA: Diagnosis not present

## 2018-09-08 DIAGNOSIS — E1122 Type 2 diabetes mellitus with diabetic chronic kidney disease: Secondary | ICD-10-CM | POA: Diagnosis not present

## 2018-09-11 ENCOUNTER — Ambulatory Visit: Payer: Self-pay | Admitting: Urology

## 2018-09-17 DIAGNOSIS — E119 Type 2 diabetes mellitus without complications: Secondary | ICD-10-CM | POA: Diagnosis not present

## 2018-09-17 DIAGNOSIS — E11649 Type 2 diabetes mellitus with hypoglycemia without coma: Secondary | ICD-10-CM | POA: Diagnosis not present

## 2018-09-17 DIAGNOSIS — M353 Polymyalgia rheumatica: Secondary | ICD-10-CM | POA: Diagnosis not present

## 2018-09-17 DIAGNOSIS — F329 Major depressive disorder, single episode, unspecified: Secondary | ICD-10-CM | POA: Diagnosis not present

## 2018-09-17 DIAGNOSIS — N183 Chronic kidney disease, stage 3 (moderate): Secondary | ICD-10-CM | POA: Diagnosis not present

## 2018-09-17 DIAGNOSIS — J449 Chronic obstructive pulmonary disease, unspecified: Secondary | ICD-10-CM | POA: Diagnosis not present

## 2018-09-17 DIAGNOSIS — L89152 Pressure ulcer of sacral region, stage 2: Secondary | ICD-10-CM | POA: Diagnosis not present

## 2018-09-17 DIAGNOSIS — I129 Hypertensive chronic kidney disease with stage 1 through stage 4 chronic kidney disease, or unspecified chronic kidney disease: Secondary | ICD-10-CM | POA: Diagnosis not present

## 2018-09-17 DIAGNOSIS — N3000 Acute cystitis without hematuria: Secondary | ICD-10-CM | POA: Diagnosis not present

## 2018-09-22 ENCOUNTER — Other Ambulatory Visit: Payer: Self-pay | Admitting: *Deleted

## 2018-09-22 NOTE — Patient Outreach (Addendum)
[facility]) Care Management  09/22/2018  Tracy Mcmahon 06/13/1929 [id number]   Telephone follow up call   Unsuccessful telephone outreach call to patient , no answer able to leave a HIPAA compliant message for return call.    Plan  Will await return call if no response will plan return call in the next 4 business days to this previously [facility] active patient.    [name], RN, [place] Management Coordinator  [phone]- Mobile [phone]- Toll Free Main Office     [place], [place], [place] Management Coordinator  [phone]- Mobile [phone]- Toll Free Main Office

## 2018-09-25 ENCOUNTER — Other Ambulatory Visit: Payer: Self-pay | Admitting: *Deleted

## 2018-09-25 NOTE — Patient Outreach (Addendum)
[facility]) Care Management  09/25/2018  Tracy Mcmahon 1929/01/11 [id number]  Telephone assessment  Referral received 9/23 Referral source: [facility] Case Manager Referral reason : Admission 9/20-9/22, Dx. Hypoglycemia and UTI  Transition of care by PCP office - [facility]  Chart reviewed PMHx : includes but not limited to Hypertension , COPD with home oxygen, Depression , Diabetes.    Successful telephone outreach call to patient, she reports doing just fine, currently sitting in her recliner chair.   Patient discussed :  UTI Denies any symptoms of UTI when reviewed. Patient reports getting up more often to walk to the bathroom and less wet depends.  Patient discussed upcoming appointment with specialist , states her daughter keeps up with that .  Patient reports last home health therapy visit on last week and she is doing much better, and using her walker.   Diabetes Patient reports that her blood sugars are running much better, she unable to recall her recent reading and her daughter is not available.  Reports tolerating diet, denies any low blood sugar symptoms when reviewed. Patient states her daughter continues to help her with keeping up with insulin and sugars.    COPD Patient reports that her breathing is doing good ,using oxygen as needed. Review of worsening of COPD symptoms and patient denies shortness of breath, cough or increased sputum production. Encouraged patient with taking respiratory medications to prevent increase in symptoms.    Plan  Will plan return call to daughter in the next 2 weeks after urology visit for follow up on diabetes, blood sugar reading and next visit.     THN CM Care Plan Problem One     Most Recent Value  Care Plan Problem One  Recent hospital admission related to UIT, hypoglycemia at risk for readmission ,4 admits in last 4 months    Role Documenting the Problem One  Care Management [place]  for Problem One  Active  THN Long Term Goal   Patient will not experience a hospital admission in the next 60 days  [extended goal date d/t high risk readmission ]  THN Long Term Goal Start Date  08/18/18  Interventions for Problem One Long Term Goal  Review of current clinical symptoms and discussed keeping all medical appointments , new visit to urologist.   [name] CM Short Term Goal #1   Over the next 30 days patient/daugther will be able to state at least 3 sympotms of UTI and action plan   [facility] CM Short Term Goal #1 Start Date  08/18/18  [facility] CM Short Term Goal #1 Met Date  09/25/18  THN CM Short Term Goal #2   Over the next 30 days patient will be able to states 3 measures to reduce UTI infections   THN CM Short Term Goal #2 Start Date  10/23/18 Barrie Folk date restart to met goal ]  Interventions for Short Term Goal #2  discussed with patient, steps in preventing UTI, getting up urinate during the day at least every 2 hours, drink adequate fluids daily, wiping front to back and why that is important   THN CM Short Term Goal #3  Patient will attend all medical appointment in the next 30 days  THN CM Short Term Goal #3 Start Date  08/18/18  [name] CM Short Term Goal #3 Met Date  09/25/18    [facility] CM Care Plan Problem Two     Most Recent Value  Care Plan Problem  Two  Knowledge deficit related to diabetes management   Role Documenting the Problem Two  Care Management Assistant  Care Plan for Problem Two  Active  THN CM Short Term Goal #1   Patient will be able to report steps to treating low blood sugar over the next 30 days   THN CM Short Term Goal #1 Start Date  09/25/18 Barrie Folk restart , patient needs review ]  Interventions for Short Term Goal #2   Review of low blood sugar symptoms, and examples of quick sugar sources to use ,   THN CM Short Term Goal #2   Over the next 30 days patient/daughter will begin to monitor blood sugars at least twice daily and keep a record.  THN CM Short Term Goal #2 Start  Date  09/02/18  Interventions for Short Term Goal #2  Discussed with patient importance of continuing to check blood sugars per MD recommendations      [name], RN, [place] Management Coordinator  [phone]- Mobile [phone]- [place] Office

## 2018-09-30 DIAGNOSIS — J449 Chronic obstructive pulmonary disease, unspecified: Secondary | ICD-10-CM | POA: Diagnosis not present

## 2018-09-30 DIAGNOSIS — I129 Hypertensive chronic kidney disease with stage 1 through stage 4 chronic kidney disease, or unspecified chronic kidney disease: Secondary | ICD-10-CM | POA: Diagnosis not present

## 2018-09-30 DIAGNOSIS — N3000 Acute cystitis without hematuria: Secondary | ICD-10-CM | POA: Diagnosis not present

## 2018-09-30 DIAGNOSIS — E119 Type 2 diabetes mellitus without complications: Secondary | ICD-10-CM | POA: Diagnosis not present

## 2018-09-30 DIAGNOSIS — M353 Polymyalgia rheumatica: Secondary | ICD-10-CM | POA: Diagnosis not present

## 2018-09-30 DIAGNOSIS — L89152 Pressure ulcer of sacral region, stage 2: Secondary | ICD-10-CM | POA: Diagnosis not present

## 2018-09-30 DIAGNOSIS — E11649 Type 2 diabetes mellitus with hypoglycemia without coma: Secondary | ICD-10-CM | POA: Diagnosis not present

## 2018-09-30 DIAGNOSIS — F329 Major depressive disorder, single episode, unspecified: Secondary | ICD-10-CM | POA: Diagnosis not present

## 2018-09-30 DIAGNOSIS — N183 Chronic kidney disease, stage 3 (moderate): Secondary | ICD-10-CM | POA: Diagnosis not present

## 2018-10-01 DIAGNOSIS — L89152 Pressure ulcer of sacral region, stage 2: Secondary | ICD-10-CM | POA: Diagnosis not present

## 2018-10-01 DIAGNOSIS — I129 Hypertensive chronic kidney disease with stage 1 through stage 4 chronic kidney disease, or unspecified chronic kidney disease: Secondary | ICD-10-CM | POA: Diagnosis not present

## 2018-10-01 DIAGNOSIS — E11649 Type 2 diabetes mellitus with hypoglycemia without coma: Secondary | ICD-10-CM | POA: Diagnosis not present

## 2018-10-01 DIAGNOSIS — N3000 Acute cystitis without hematuria: Secondary | ICD-10-CM | POA: Diagnosis not present

## 2018-10-01 DIAGNOSIS — N183 Chronic kidney disease, stage 3 (moderate): Secondary | ICD-10-CM | POA: Diagnosis not present

## 2018-10-01 DIAGNOSIS — E119 Type 2 diabetes mellitus without complications: Secondary | ICD-10-CM | POA: Diagnosis not present

## 2018-10-01 DIAGNOSIS — J449 Chronic obstructive pulmonary disease, unspecified: Secondary | ICD-10-CM | POA: Diagnosis not present

## 2018-10-01 DIAGNOSIS — M353 Polymyalgia rheumatica: Secondary | ICD-10-CM | POA: Diagnosis not present

## 2018-10-02 ENCOUNTER — Ambulatory Visit (INDEPENDENT_AMBULATORY_CARE_PROVIDER_SITE_OTHER): Payer: Medicare HMO | Admitting: Urology

## 2018-10-02 ENCOUNTER — Other Ambulatory Visit: Payer: Self-pay | Admitting: Family Medicine

## 2018-10-02 ENCOUNTER — Encounter: Payer: Self-pay | Admitting: Urology

## 2018-10-02 VITALS — BP 126/52 | HR 50 | Ht 60.0 in

## 2018-10-02 DIAGNOSIS — Z85528 Personal history of other malignant neoplasm of kidney: Secondary | ICD-10-CM | POA: Diagnosis not present

## 2018-10-02 DIAGNOSIS — N952 Postmenopausal atrophic vaginitis: Secondary | ICD-10-CM

## 2018-10-02 DIAGNOSIS — N895 Stricture and atresia of vagina: Secondary | ICD-10-CM

## 2018-10-02 DIAGNOSIS — N390 Urinary tract infection, site not specified: Secondary | ICD-10-CM | POA: Diagnosis not present

## 2018-10-02 DIAGNOSIS — N3946 Mixed incontinence: Secondary | ICD-10-CM | POA: Diagnosis not present

## 2018-10-02 LAB — BLADDER SCAN AMB NON-IMAGING: Scan Result: 66

## 2018-10-02 MED ORDER — ESTROGENS, CONJUGATED 0.625 MG/GM VA CREA: 1.0000 | TOPICAL_CREAM | Freq: Every day | VAGINAL | 12 refills | Status: DC

## 2018-10-02 NOTE — Progress Notes (Signed)
10/02/2018 5:03 PM   Tracy Mcmahon 01/22/1929 [id number]  Referring provider: [name], MD [place] [facility] [place], [place] [address]  Chief Complaint  Patient presents with  . Recurrent UTI    HPI: 84 year old female who presents today for further evaluation of recurrent urinary tract infections.  She is had at least 3 urinary tract infections over the past year growing E. coli, pansensitive on 2 occasions as well as Klebsiella.  She is had several hospital admissions for this as well.  Her symptoms are altered mental status and altered mental status.    She denies and associated urinary symptoms including no dysuria, gross hematuria, or urgency/ frequency.    Both the patient and her daughter do not believe that she is ever been catheterized for specimen.  She is provided urine specimens via voiding and a hat, as well as using a urine wick device.  Her daughter reports that she did have infrequent urinary tract infections prior to this year.  Over the past year, she started wearing diapers and voiding into the diaper.  She is a hard time getting her mom to go to the bathroom even with timed voiding.  She is very frustrated with her mother as she knows that hygiene is very important.  She cannot get her mom to take baths.  She does have a very remote history of renal cell carcinoma status post nephrectomy many years ago without recurrence.  She cannot recall the exact details.  Today, she is asymptomatic.   PMH: Past Medical History:  Diagnosis Date  . Cancer ([place])   . Cataract   . Chronic kidney disease (CKD), stage III (moderate) (HCC)   . Chronic urticaria   . COPD (chronic obstructive pulmonary disease) ([place])   . Depression   . Diabetes mellitus without complication ([place])   . DVT (deep venous thrombosis) (HCC)    left leg  . Hyperlipemia   . Hypertension   . Osteoarthritis   . Osteopenia   . Polymyalgia rheumatica ([place])   .  Polyneuropathy   . Renal cancer ([place])   . Renal insufficiency 08/10/2016   Chart indicates CKD stage 3  . Restless leg syndrome   . Vitamin D deficiency   . Vulvar intraepithelial neoplasia with lichen sclerosus     Surgical History: Past Surgical History:  Procedure Laterality Date  . ABDOMINAL HYSTERECTOMY    . APPENDECTOMY    . CATARACT EXTRACTION    . COLONOSCOPY    . JOINT REPLACEMENT Bilateral    hip  . LAMINOTOMY    . NEPHRECTOMY    . TEMPORAL ARTERY BIOPSY / LIGATION    . TONSILLECTOMY      Home Medications:  Allergies as of 10/02/2018      Reactions   Fosamax [alendronate Sodium] Other (See Comments)   Reaction:  Made pt "very sick"   Wellbutrin [bupropion] Other (See Comments)   Reaction:  Unknown      Medication List        Accurate as of 10/02/18  5:03 PM. Always use your most recent med list.          albuterol (2.5 MG/3ML) 0.083% nebulizer solution Commonly known as:  PROVENTIL Take 3 mLs (2.5 mg total) by nebulization every 4 (four) hours.   alendronate 35 MG tablet Commonly known as:  FOSAMAX Take 35 mg by mouth every 7 (seven) days. Take with a full glass of water on an empty stomach.  amLODipine 5 MG tablet Commonly known as:  NORVASC Take 1 tablet (5 mg total) by mouth daily.   apixaban 5 MG Tabs tablet Commonly known as:  ELIQUIS Take 1 tablet (5 mg total) by mouth 2 (two) times daily.   aspirin EC 81 MG tablet Take 81 mg by mouth daily.   atenolol 25 MG tablet Commonly known as:  TENORMIN Take 25 mg by mouth 2 (two) times daily.   azelastine 0.05 % ophthalmic solution Commonly known as:  OPTIVAR Place 1 drop into both eyes 2 (two) times daily as needed for itching.   CALCIUM 600/VITAMIN D3 600-800 MG-UNIT Tabs Generic drug:  Calcium Carb-Cholecalciferol Take 600 mg by mouth daily.   conjugated estrogens vaginal cream Commonly known as:  PREMARIN Place 1 Applicatorful vaginally daily. Use pea sized amount M-W-Fr before  bedtime   diphenhydramine-acetaminophen 25-500 MG Tabs tablet Commonly known as:  TYLENOL PM Take 1 tablet by mouth at bedtime as needed (sleep or pain).   docusate sodium 100 MG capsule Commonly known as:  COLACE Take 1 capsule (100 mg total) by mouth 2 (two) times daily.   FLUoxetine 20 MG capsule Commonly known as:  PROZAC Take 20 mg by mouth daily.   furosemide 20 MG tablet Commonly known as:  LASIX Take 20 mg by mouth daily.   gabapentin 300 MG capsule Commonly known as:  NEURONTIN Take 1 capsule (300MG ) by mouth every morning, 1 capsule (300MG ) every evening and 3 capsules (900MG ) at bedtime   GNP CAL MAG ZINC +D3 PO Take 1 tablet by mouth daily.   insulin aspart 100 UNIT/ML injection Commonly known as:  novoLOG Inject 0-9 Units into the skin 3 (three) times daily with meals. CBG 70 - 120: 0 units CBG 121 - 150: 1 unit CBG 151 - 200: 2 units CBG 201 - 250: 3 units CBG 251 - 300: 5 units CBG 301 - 350: 7 units CBG 351 - 400: 9 units CBG > 400: call MD   polyethylene glycol packet Commonly known as:  MIRALAX / GLYCOLAX Take 17 g by mouth daily as needed for mild constipation.   potassium chloride 20 MEQ packet Commonly known as:  KLOR-CON Take 20 mEq by mouth daily. While on lasix   pravastatin 20 MG tablet Commonly known as:  PRAVACHOL Take 20 mg by mouth every evening.   predniSONE 5 MG tablet Commonly known as:  DELTASONE Take 7.5 mg by mouth daily.   tiotropium 18 MCG inhalation capsule Commonly known as:  SPIRIVA Place 1 capsule (18 mcg total) into inhaler and inhale daily.   vitamin B-12 1000 MCG tablet Commonly known as:  CYANOCOBALAMIN Take 1,000 mcg by mouth daily.   Vitamin D (Ergocalciferol) 1.25 MG (50000 UT) Caps capsule Commonly known as:  DRISDOL Take 50,000 Units by mouth every Monday.       Allergies:  Allergies  Allergen Reactions  . Fosamax [Alendronate Sodium] Other (See Comments)    Reaction:  Made pt "very sick"  .  Wellbutrin [Bupropion] Other (See Comments)    Reaction:  Unknown    Family History: Family History  Problem Relation Age of Onset  . Hypertension Unknown   . Diabetes Unknown     Social History:  reports that she has never smoked. She has never used smokeless tobacco. She reports that she does not drink alcohol or use drugs.  ROS: UROLOGY Frequent Urination?: No Hard to postpone urination?: Yes Burning/pain with urination?: No Get up at night to  urinate?: No Leakage of urine?: Yes Urine stream starts and stops?: No Trouble starting stream?: No Do you have to strain to urinate?: No Blood in urine?: Yes Urinary tract infection?: Yes Sexually transmitted disease?: No Injury to kidneys or bladder?: No Painful intercourse?: No Weak stream?: No Currently pregnant?: No Vaginal bleeding?: No Last menstrual period?: n  Gastrointestinal Nausea?: No Vomiting?: No Indigestion/heartburn?: No Diarrhea?: No Constipation?: No  Constitutional Fever: No Night sweats?: No Weight loss?: No Fatigue?: No  Skin Skin rash/lesions?: No Itching?: No  Eyes Blurred vision?: No Double vision?: No  Ears/Nose/Throat Sore throat?: No Sinus problems?: No  Hematologic/Lymphatic Swollen glands?: No Easy bruising?: Yes  Cardiovascular Leg swelling?: No Chest pain?: No  Respiratory Cough?: No Shortness of breath?: No  Endocrine Excessive thirst?: No  Musculoskeletal Back pain?: No Joint pain?: Yes  Neurological Headaches?: No Dizziness?: No  Psychologic Depression?: Yes Anxiety?: Yes  Physical Exam: BP (!) 126/52 (BP Location: Left Arm, Patient Position: Sitting, Cuff Size: Large)   Pulse (!) 50   Ht 5' (1.524 m)   LMP  (LMP Unknown)   BMI 36.13 kg/m   Constitutional:  Alert and oriented, No acute distress.  Accompanied by daughter today who provides most of the history.  Malodorous, stale urine smell. HEENT: [place] AT, moist mucus membranes.  Trachea midline, no  masses. Cardiovascular: No clubbing, cyanosis, or edema. Respiratory: Normal respiratory effort, no increased work of breathing. GI: Abdomen is soft, nontender, nondistended, no abdominal masses, obese.   GU: Excoriated and hypopigmented external genitalia with chronic skin changes.  Stenotic vaginal opening, not able to accommodate speculum.  Atrophic vaginal mucosa appreciated.  Urethral meatus is normal.  There is urine in her vaginal vault and active trickling of urine from urethral meatus appreciated during examination with Valsalva. Skin: No rashes, bruises or suspicious lesions. Neurologic: Grossly intact, no focal deficits, moving all 4 extremities. Psychiatric: Normal mood and affect.  Laboratory Data: Lab Results  Component Value Date   WBC 10.5 08/15/2018   HGB 9.4 (L) 08/15/2018   HCT 28.7 (L) 08/15/2018   MCV 87.3 08/15/2018   PLT 337 08/15/2018    Lab Results  Component Value Date   CREATININE 1.25 (H) 08/15/2018    Lab Results  Component Value Date   HGBA1C 8.3 (H) 12/17/2016    Urinalysis NA  Pertinent Imaging: Results for orders placed or performed in visit on 10/02/18  Bladder Scan (Post Void Residual) in office  Result Value Ref Range   Scan Result 66     Assessment & Plan:    1. Recurrent UTI Lengthy discussion today about the natural history of recurrent urinary tract infections as well as treatment options  I am highly suspicious that her symptoms are in fact not related to UTI in the first place.  Her habitus, mental status, and anatomy is such that there is no possible way that she is providing a sterile urine specimen if she is not being catheterized of which I see no documentation.  Her symptoms are primarily blood sugar related, hypoglycemia which is less typical than usually associated hyperglycemia.  I have strongly encouraged that if there is concern for urinary tract infection, that she be catheterized for specimen as the only reliable way  to collect a urine/urine culture.  Addition to this, she would likely benefit from cranberry tablets twice daily, probiotic, and topical estrogen cream.  I showed her daughter how to apply this today, pea-sized amount per urethral meatus 3 times a week.  Adequate bladder emptying today.   Recommend renal ultrasound to rule out any upper tract pathology.  I encouraged her to come to our office for a catheterized specimen if she has signs or symptoms of UTI.  I written out these recommendations which are provided on her handout today.   - Bladder Scan (Post Void Residual) in office - US RENAL; Future  2. History of renal cell cancer Remote history of renal cell carcinoma status post nephrectomy  Renal ultrasound as above to rule out any upper tract pathology  3. Atrophic vaginitis Atrophic vaginitis with chronic changes appreciated  In addition to topical estrogen, I recommended a barrier cream such as A/D or Desitin  4. Vaginal stenosis As above  5. Urinary incontinence, mixed We discussed hygiene issues as it relates to urinary tract infection When there are reports that her urine smells badly, I suspect this has to do with stale urine in the diaper and hygiene rather than infected urine   Hollice Espy, MD  [place] [address] [place], [place] [address] [phone]   I spent 45 min with this patient of which greater than 50% was spent in counseling and coordination of care with the patient.

## 2018-10-02 NOTE — Patient Instructions (Addendum)
1. Cranberry tablets twice daily  2. Probiotic daily  3. Estrogen cream M-W-Fr  Pea size amount per urethral meatus  4. Renal ultrasound  5.  Call our office for same day visit for catheterized urine sample

## 2018-10-09 ENCOUNTER — Other Ambulatory Visit: Payer: Self-pay | Admitting: *Deleted

## 2018-10-09 NOTE — Patient Outreach (Signed)
Tracy Mcmahon [facility]) Care Management  10/09/2018  Tracy Mcmahon 01-01-1929 [id number]   Telephone follow up call   Referral received 9/23 Referral source: [facility] Case Manager Referral reason : Admission 9/20-9/22, Dx. Hypoglycemia and UTI  Transition of care by PCP office - [facility]  Chart reviewed PMHx : includes but not limited to Hypertension , COPD with home oxygen, Depression , Diabetes.    Successful outreach call to patient , able to speak with her daughter, caregiver, Tracy Mcmahon.  Recent UTI Daughter discussed patient recent visit to urologist , reviewed signs of UTI non noted. Daughter continues to encourage patient with personal care, getting up to bathroom and changing her pull up more up.  Diabetes  Daughter discussed patient blood sugars running less than 200 , no low blood sugar readings , this morning reading 156. Daughter discussed that patient has a new meter and supplies. Reviewed insulin plan she agrees administering as prescribed by MD, requiring less correction scale .  Reinforced monitoring of blood sugar for low blood sugar symptoms reviewed.   Daughter discussed patient has completed how physical therapy patient continues to use her walker in home .   Patient daughter reports that they will be going to [place] this weekend and for thanksgiving , she is packing up things now, filling patient pill organizer now making sure that she has all of patient medications, respiratory supplies.  She denies having cost concerns related to medications. Discussed [employer] over the counter book, believes she has it.    COPD Patient continues to use oxygen, take respiratory treatment , daughter denies patient having any increase in worsening symptoms of COPD, as reviewed increased shortness of breath, cough, sputum production. Reinforced taking medications as prescribed.   Patient daughter denies any new concerns, offered home visit in the next  month , daughter agreeable to telephone follow up call.    Plan  Will plan return call in the next month.    THN CM Care Plan Problem One     Most Recent Value  Care Plan Problem One  Recent hospital admission related to UIT, hypoglycemia at risk for readmission ,4 admits in last 4 months    Role Documenting the Problem One  Care Management [place] for Problem One  Active  [facility] Long Term Goal   Patient will not experience a hospital admission in the next 90 days  [extended goal date d/t high risk readmission ]  THN Long Term Goal Start Date  08/18/18  Interventions for Problem One Long Term Goal  Review current clinical state and review of symptoms of UTI early notification to MD for office visit and catheterized urine specimen.   THN CM Short Term Goal #1   Over the next 30 days patient/daugther will be able to state at least 3 sympotms of UTI and action plan   THN CM Short Term Goal #1 Start Date  08/18/18  [facility] CM Short Term Goal #1 Met Date  09/25/18  THN CM Short Term Goal #2   Over the next 30 days patient will be able to states 3 measures to reduce UTI infections   THN CM Short Term Goal #2 Start Date  09/25/18 Barrie Folk date restart to met goal ]  Interventions for Short Term Goal #2  Reinforced with teachback with daughter   [facility] CM Short Term Goal #3  Patient will attend all medical appointment in the next 30 days  THN CM Short Term Goal #3  Start Date  08/18/18  THN CM Short Term Goal #3 Met Date  09/25/18    [facility] CM Care Plan Problem Two     Most Recent Value  Care Plan Problem Two  Knowledge deficit related to diabetes management   Role Documenting the Problem Two  Care Management Assistant  Care Plan for Problem Two  Active  THN CM Short Term Goal #1   Patient will be able to report steps to treating low blood sugar over the next 30 days   THN CM Short Term Goal #1 Start Date  09/25/18 Barrie Folk restart , patient needs review ]  Interventions for Short Term Goal #2    Reviewed with teachback with patient daughter, reinforcing rule of 15 ,   THN CM Short Term Goal #2   Over the next 30 days patient/daughter will begin to monitor blood sugars at least twice daily and keep a record.  THN CM Short Term Goal #2 Start Date  09/02/18  [facility]N CM Short Term Goal #2 Met Date  10/09/18  THN CM Short Term Goal #3   Patient/caregiver will be able to verbalize 2 ways of managing low blood sugr over the next 30 day s  [facility] CM Short Term Goal #3 Start Date  10/09/18  Interventions for Short Term Goal #3  Discuss snack options to have on hand for travel ,hard candy, glucose tablets, peanut butter/crackers .       [name], RN, [place] Management Coordinator  [phone]- Mobile [phone]- Toll Free Main Office

## 2018-10-15 DIAGNOSIS — N183 Chronic kidney disease, stage 3 (moderate): Secondary | ICD-10-CM | POA: Diagnosis not present

## 2018-10-15 DIAGNOSIS — J449 Chronic obstructive pulmonary disease, unspecified: Secondary | ICD-10-CM | POA: Diagnosis not present

## 2018-10-15 DIAGNOSIS — E11649 Type 2 diabetes mellitus with hypoglycemia without coma: Secondary | ICD-10-CM | POA: Diagnosis not present

## 2018-10-15 DIAGNOSIS — N3000 Acute cystitis without hematuria: Secondary | ICD-10-CM | POA: Diagnosis not present

## 2018-10-15 DIAGNOSIS — L89152 Pressure ulcer of sacral region, stage 2: Secondary | ICD-10-CM | POA: Diagnosis not present

## 2018-10-15 DIAGNOSIS — E119 Type 2 diabetes mellitus without complications: Secondary | ICD-10-CM | POA: Diagnosis not present

## 2018-10-15 DIAGNOSIS — I129 Hypertensive chronic kidney disease with stage 1 through stage 4 chronic kidney disease, or unspecified chronic kidney disease: Secondary | ICD-10-CM | POA: Diagnosis not present

## 2018-10-15 DIAGNOSIS — F329 Major depressive disorder, single episode, unspecified: Secondary | ICD-10-CM | POA: Diagnosis not present

## 2018-10-15 DIAGNOSIS — M353 Polymyalgia rheumatica: Secondary | ICD-10-CM | POA: Diagnosis not present

## 2018-10-19 ENCOUNTER — Other Ambulatory Visit: Payer: Self-pay

## 2018-10-19 ENCOUNTER — Observation Stay
Admission: EM | Admit: 2018-10-19 | Discharge: 2018-10-21 | Disposition: A | Discharge status: Skilled Nursing Facility | Payer: Medicare HMO | Attending: Internal Medicine | Admitting: Internal Medicine

## 2018-10-19 ENCOUNTER — Emergency Department: Payer: Medicare HMO

## 2018-10-19 DIAGNOSIS — R531 Weakness: Secondary | ICD-10-CM

## 2018-10-19 DIAGNOSIS — Z85528 Personal history of other malignant neoplasm of kidney: Secondary | ICD-10-CM | POA: Insufficient documentation

## 2018-10-19 DIAGNOSIS — Z86718 Personal history of other venous thrombosis and embolism: Secondary | ICD-10-CM | POA: Insufficient documentation

## 2018-10-19 DIAGNOSIS — I447 Left bundle-branch block, unspecified: Secondary | ICD-10-CM | POA: Insufficient documentation

## 2018-10-19 DIAGNOSIS — G2581 Restless legs syndrome: Secondary | ICD-10-CM | POA: Insufficient documentation

## 2018-10-19 DIAGNOSIS — E1151 Type 2 diabetes mellitus with diabetic peripheral angiopathy without gangrene: Secondary | ICD-10-CM | POA: Diagnosis not present

## 2018-10-19 DIAGNOSIS — R52 Pain, unspecified: Secondary | ICD-10-CM

## 2018-10-19 DIAGNOSIS — D631 Anemia in chronic kidney disease: Secondary | ICD-10-CM | POA: Insufficient documentation

## 2018-10-19 DIAGNOSIS — E669 Obesity, unspecified: Secondary | ICD-10-CM | POA: Insufficient documentation

## 2018-10-19 DIAGNOSIS — M7918 Myalgia, other site: Secondary | ICD-10-CM | POA: Diagnosis not present

## 2018-10-19 DIAGNOSIS — E1140 Type 2 diabetes mellitus with diabetic neuropathy, unspecified: Secondary | ICD-10-CM | POA: Insufficient documentation

## 2018-10-19 DIAGNOSIS — S79912A Unspecified injury of left hip, initial encounter: Secondary | ICD-10-CM | POA: Diagnosis not present

## 2018-10-19 DIAGNOSIS — N183 Chronic kidney disease, stage 3 (moderate): Secondary | ICD-10-CM | POA: Insufficient documentation

## 2018-10-19 DIAGNOSIS — Z7982 Long term (current) use of aspirin: Secondary | ICD-10-CM | POA: Insufficient documentation

## 2018-10-19 DIAGNOSIS — Z87440 Personal history of urinary (tract) infections: Secondary | ICD-10-CM | POA: Diagnosis not present

## 2018-10-19 DIAGNOSIS — Z888 Allergy status to other drugs, medicaments and biological substances status: Secondary | ICD-10-CM | POA: Insufficient documentation

## 2018-10-19 DIAGNOSIS — Z794 Long term (current) use of insulin: Secondary | ICD-10-CM | POA: Insufficient documentation

## 2018-10-19 DIAGNOSIS — M549 Dorsalgia, unspecified: Secondary | ICD-10-CM | POA: Diagnosis not present

## 2018-10-19 DIAGNOSIS — M353 Polymyalgia rheumatica: Secondary | ICD-10-CM | POA: Diagnosis not present

## 2018-10-19 DIAGNOSIS — E785 Hyperlipidemia, unspecified: Secondary | ICD-10-CM | POA: Diagnosis not present

## 2018-10-19 DIAGNOSIS — R410 Disorientation, unspecified: Secondary | ICD-10-CM | POA: Diagnosis not present

## 2018-10-19 DIAGNOSIS — M24812 Other specific joint derangements of left shoulder, not elsewhere classified: Secondary | ICD-10-CM | POA: Insufficient documentation

## 2018-10-19 DIAGNOSIS — Z96643 Presence of artificial hip joint, bilateral: Secondary | ICD-10-CM | POA: Insufficient documentation

## 2018-10-19 DIAGNOSIS — I1310 Hypertensive heart and chronic kidney disease without heart failure, with stage 1 through stage 4 chronic kidney disease, or unspecified chronic kidney disease: Secondary | ICD-10-CM | POA: Diagnosis not present

## 2018-10-19 DIAGNOSIS — I442 Atrioventricular block, complete: Secondary | ICD-10-CM | POA: Insufficient documentation

## 2018-10-19 DIAGNOSIS — E860 Dehydration: Secondary | ICD-10-CM | POA: Diagnosis not present

## 2018-10-19 DIAGNOSIS — N3000 Acute cystitis without hematuria: Secondary | ICD-10-CM

## 2018-10-19 DIAGNOSIS — B9620 Unspecified Escherichia coli [E. coli] as the cause of diseases classified elsewhere: Secondary | ICD-10-CM | POA: Diagnosis present

## 2018-10-19 DIAGNOSIS — E1122 Type 2 diabetes mellitus with diabetic chronic kidney disease: Secondary | ICD-10-CM | POA: Insufficient documentation

## 2018-10-19 DIAGNOSIS — I700 Atherosclerosis of aorta: Secondary | ICD-10-CM | POA: Diagnosis not present

## 2018-10-19 DIAGNOSIS — W07XXXA Fall from chair, initial encounter: Secondary | ICD-10-CM | POA: Diagnosis not present

## 2018-10-19 DIAGNOSIS — E559 Vitamin D deficiency, unspecified: Secondary | ICD-10-CM | POA: Insufficient documentation

## 2018-10-19 DIAGNOSIS — S299XXA Unspecified injury of thorax, initial encounter: Secondary | ICD-10-CM | POA: Diagnosis not present

## 2018-10-19 DIAGNOSIS — F329 Major depressive disorder, single episode, unspecified: Secondary | ICD-10-CM | POA: Insufficient documentation

## 2018-10-19 DIAGNOSIS — Z7952 Long term (current) use of systemic steroids: Secondary | ICD-10-CM | POA: Insufficient documentation

## 2018-10-19 DIAGNOSIS — M8580 Other specified disorders of bone density and structure, unspecified site: Secondary | ICD-10-CM | POA: Insufficient documentation

## 2018-10-19 DIAGNOSIS — M24811 Other specific joint derangements of right shoulder, not elsewhere classified: Secondary | ICD-10-CM | POA: Insufficient documentation

## 2018-10-19 DIAGNOSIS — R51 Headache: Secondary | ICD-10-CM | POA: Diagnosis not present

## 2018-10-19 DIAGNOSIS — E1165 Type 2 diabetes mellitus with hyperglycemia: Secondary | ICD-10-CM | POA: Insufficient documentation

## 2018-10-19 DIAGNOSIS — N390 Urinary tract infection, site not specified: Principal | ICD-10-CM | POA: Insufficient documentation

## 2018-10-19 DIAGNOSIS — G44309 Post-traumatic headache, unspecified, not intractable: Secondary | ICD-10-CM | POA: Diagnosis not present

## 2018-10-19 DIAGNOSIS — Z905 Acquired absence of kidney: Secondary | ICD-10-CM | POA: Insufficient documentation

## 2018-10-19 DIAGNOSIS — J441 Chronic obstructive pulmonary disease with (acute) exacerbation: Secondary | ICD-10-CM | POA: Insufficient documentation

## 2018-10-19 DIAGNOSIS — Z8249 Family history of ischemic heart disease and other diseases of the circulatory system: Secondary | ICD-10-CM | POA: Insufficient documentation

## 2018-10-19 DIAGNOSIS — Z7901 Long term (current) use of anticoagulants: Secondary | ICD-10-CM | POA: Insufficient documentation

## 2018-10-19 DIAGNOSIS — Z79899 Other long term (current) drug therapy: Secondary | ICD-10-CM | POA: Insufficient documentation

## 2018-10-19 LAB — COMPREHENSIVE METABOLIC PANEL
ALBUMIN: 3 g/dL — AB (ref 3.5–5.0)
ALK PHOS: 61 U/L (ref 38–126)
ALT: 13 U/L (ref 0–44)
AST: 20 U/L (ref 15–41)
Anion gap: 8 (ref 5–15)
BUN: 20 mg/dL (ref 8–23)
CALCIUM: 9.5 mg/dL (ref 8.9–10.3)
CO2: 29 mmol/L (ref 22–32)
CREATININE: 1.28 mg/dL — AB (ref 0.44–1.00)
Chloride: 104 mmol/L (ref 98–111)
GFR calc non Af Amer: 36 mL/min — ABNORMAL LOW (ref >60)
GFR, EST AFRICAN AMERICAN: 42 mL/min — AB (ref >60)
GLUCOSE: 132 mg/dL — AB (ref 70–99)
Potassium: 4.3 mmol/L (ref 3.5–5.1)
SODIUM: 141 mmol/L (ref 135–145)
Total Bilirubin: 0.5 mg/dL (ref 0.3–1.2)
Total Protein: 6.2 g/dL — ABNORMAL LOW (ref 6.5–8.1)

## 2018-10-19 LAB — CBC WITH DIFFERENTIAL/PLATELET
Abs Immature Granulocytes: 0.04 10*3/uL (ref 0.00–0.07)
Basophils Absolute: 0 10*3/uL (ref 0.0–0.1)
Basophils Relative: 1 %
EOS ABS: 0.3 10*3/uL (ref 0.0–0.5)
EOS PCT: 4 %
HEMATOCRIT: 35 % — AB (ref 36.0–46.0)
HEMOGLOBIN: 10.2 g/dL — AB (ref 12.0–15.0)
Immature Granulocytes: 1 %
LYMPHS ABS: 2.5 10*3/uL (ref 0.7–4.0)
Lymphocytes Relative: 30 %
MCH: 26.7 pg (ref 26.0–34.0)
MCHC: 29.1 g/dL — AB (ref 30.0–36.0)
MCV: 91.6 fL (ref 80.0–100.0)
MONO ABS: 0.6 10*3/uL (ref 0.1–1.0)
MONOS PCT: 8 %
Neutro Abs: 4.9 10*3/uL (ref 1.7–7.7)
Neutrophils Relative %: 56 %
Platelets: 301 10*3/uL (ref 150–400)
RBC: 3.82 MIL/uL — ABNORMAL LOW (ref 3.87–5.11)
RDW: 16.9 % — AB (ref 11.5–15.5)
WBC: 8.3 10*3/uL (ref 4.0–10.5)
nRBC: 0 % (ref 0.0–0.2)

## 2018-10-19 LAB — URINALYSIS, COMPLETE (UACMP) WITH MICROSCOPIC
BILIRUBIN URINE: NEGATIVE
Glucose, UA: NEGATIVE mg/dL
Hgb urine dipstick: NEGATIVE
Ketones, ur: NEGATIVE mg/dL
Leukocytes, UA: NEGATIVE
Nitrite: POSITIVE — AB
PROTEIN: NEGATIVE mg/dL
Specific Gravity, Urine: 1.014 (ref 1.005–1.030)
pH: 5 (ref 5.0–8.0)

## 2018-10-19 LAB — TROPONIN I

## 2018-10-19 MED ORDER — ACETAMINOPHEN 325 MG PO TABS
ORAL_TABLET | ORAL | Status: AC
  Filled 2018-10-19: qty 2

## 2018-10-19 MED ORDER — ACETAMINOPHEN 325 MG PO TABS
650.0000 mg | ORAL_TABLET | Freq: Once | ORAL | Status: AC
  Administered 2018-10-19: 650 mg via ORAL

## 2018-10-19 MED ORDER — CEFAZOLIN SODIUM-DEXTROSE 1-4 GM/50ML-% IV SOLN
1.0000 g | Freq: Once | INTRAVENOUS | Status: AC
  Administered 2018-10-19: 1 g via INTRAVENOUS
  Filled 2018-10-19: qty 50

## 2018-10-19 NOTE — ED Notes (Signed)
Claiborne Billings RN unsuccessful at PIV  Insertion and specimen collection. Raquel RN to attempt PIV insertion and specimen collection.

## 2018-10-19 NOTE — ED Triage Notes (Addendum)
Pt arrived via ems from home due to fall around 9 am. Pt fell forward out of her chair and called ems to help her. Pt did not want to come to the hospital at that time. Pt has bruising to middle of back. EMS states that pt's residence smelled of pet urine and feces. Pt states that she lives with relatives since she is not able to care for herself. Pt now c/o of pain in her back and a hematoma on the right side of her head. CBG-234, 150/63, 76.

## 2018-10-19 NOTE — ED Notes (Addendum)
Patient c/o back pain post fall. Patient reported toothache to daughter yesterday. Patient's daughter reports that patient is more confused than her baseline. Patient's daughter reports unwitnessed fall today, although patient's daughter reports she heard the fall. Patient informed daughter that her legs gave out.   Patient has bruising to back, and tenderness to palpation of left hip.

## 2018-10-19 NOTE — ED Provider Notes (Signed)
[facility] Emergency Department Provider Note  ____________________________________________   None    (approximate)  I have reviewed the triage vital signs and the nursing notes.   HISTORY  Chief Complaint Fall   HPI Tracy Mcmahon is a 84 y.o. female who presents to the emergency department for treatment and evaluation after falling forward out of her chair. Her daughter states that she has had 2 falls today but believes that they are do to the fact that she places blankets and things in front of her chair for her dog to lie on then when she tries to stands up trips.  She has also noted that patient has been increasingly confused especially over the past couple of days.  She has had recurrent and frequent urinary tract infections for which she has been evaluated by Dr. Erlene Quan.  She is not currently on antibiotics.  Daughter states that she sits in a chair all day and does not get up even to urinate therefore she stays wet.  Daughter also states that she does not want to take shower.  Patient has significant past medical history of diabetes, chronic kidney disease, recurrent UTI, and renal carcinoma.  Past Medical History:  Diagnosis Date  . Cancer ([place])   . Cataract   . Chronic kidney disease (CKD), stage III (moderate) (HCC)   . Chronic urticaria   . COPD (chronic obstructive pulmonary disease) ([place])   . Depression   . Diabetes mellitus without complication ([place])   . DVT (deep venous thrombosis) (HCC)    left leg  . Hyperlipemia   . Hypertension   . Osteoarthritis   . Osteopenia   . Polymyalgia rheumatica ([place])   . Polyneuropathy   . Renal cancer ([place])   . Renal insufficiency 08/10/2016   Chart indicates CKD stage 3  . Restless leg syndrome   . Vitamin D deficiency   . Vulvar intraepithelial neoplasia with lichen sclerosus     Patient Active Problem List   Diagnosis Date Noted  . Pressure injury of skin 08/15/2018  . Weakness 06/26/2018  .  Hypoglycemia 04/30/2018  . UTI (urinary tract infection) 03/15/2018  . Acute encephalopathy 12/19/2016  . Generalized weakness 12/19/2016  . Acute on chronic respiratory failure with hypoxia and hypercapnia ([place]) 12/16/2016  . COPD exacerbation ([place]) 12/16/2016  . Community acquired pneumonia 12/16/2016  . Lactic acidosis 12/16/2016  . Sepsis ([place]) 12/16/2016  . PE (pulmonary embolism) 08/11/2016  . COPD (chronic obstructive pulmonary disease) ([place]) 03/29/2015  . Hypertension 03/29/2015  . Diabetes ([place]) 03/29/2015  . Hyperlipidemia 03/29/2015  . Depression 03/29/2015  . Restless leg syndrome 03/29/2015  . DVT (deep venous thrombosis) ([place]) 03/29/2015  . CKD (chronic kidney disease), stage III ([place]) 03/29/2015    Past Surgical History:  Procedure Laterality Date  . ABDOMINAL HYSTERECTOMY    . APPENDECTOMY    . CATARACT EXTRACTION    . COLONOSCOPY    . JOINT REPLACEMENT Bilateral    hip  . LAMINOTOMY    . NEPHRECTOMY    . TEMPORAL ARTERY BIOPSY / LIGATION    . TONSILLECTOMY      Prior to Admission medications   Medication Sig Start Date End Date Taking? Authorizing Provider  albuterol (PROVENTIL) (2.5 MG/3ML) 0.083% nebulizer solution Take 3 mLs (2.5 mg total) by nebulization every 4 (four) hours. 12/19/16   Theodoro Grist, MD  alendronate (FOSAMAX) 35 MG tablet Take 35 mg by mouth every 7 (seven) days. Take with a full glass  of water on an empty stomach.    [provider]  amLODipine (NORVASC) 5 MG tablet Take 1 tablet (5 mg total) by mouth daily. 05/03/18   Gladstone Lighter, MD  apixaban (ELIQUIS) 5 MG TABS tablet Take 1 tablet (5 mg total) by mouth 2 (two) times daily. 05/03/18   Gladstone Lighter, MD  aspirin EC 81 MG tablet Take 81 mg by mouth daily.    [provider]  atenolol (TENORMIN) 25 MG tablet Take 25 mg by mouth 2 (two) times daily.    [provider]  azelastine (OPTIVAR) 0.05 % ophthalmic solution Place 1 drop into both eyes 2 (two)  times daily as needed for itching. 06/19/18   [provider]  Calcium Carb-Cholecalciferol (CALCIUM 600/VITAMIN D3) 600-800 MG-UNIT TABS Take 600 mg by mouth daily.    [provider]  conjugated estrogens (PREMARIN) vaginal cream Place 1 Applicatorful vaginally daily. Use pea sized amount M-W-Fr before bedtime 10/02/18   Hollice Espy, MD  diphenhydramine-acetaminophen (TYLENOL PM) 25-500 MG TABS tablet Take 1 tablet by mouth at bedtime as needed (sleep or pain).    [provider]  docusate sodium (COLACE) 100 MG capsule Take 1 capsule (100 mg total) by mouth 2 (two) times daily. 06/29/18   Gladstone Lighter, MD  FLUoxetine (PROZAC) 20 MG capsule Take 20 mg by mouth daily.    [provider]  furosemide (LASIX) 20 MG tablet Take 20 mg by mouth daily.    [provider]  gabapentin (NEURONTIN) 300 MG capsule Take 1 capsule (300MG ) by mouth every morning, 1 capsule (300MG ) every evening and 3 capsules (900MG ) at bedtime    [provider]  insulin aspart (NOVOLOG) 100 UNIT/ML injection Inject 0-9 Units into the skin 3 (three) times daily with meals. CBG 70 - 120: 0 units CBG 121 - 150: 1 unit CBG 151 - 200: 2 units CBG 201 - 250: 3 units CBG 251 - 300: 5 units CBG 301 - 350: 7 units CBG 351 - 400: 9 units CBG > 400: call MD 08/16/18   Gladstone Lighter, MD  Multiple Minerals-Vitamins (GNP CAL MAG ZINC +D3 PO) Take 1 tablet by mouth daily.    [provider]  polyethylene glycol (MIRALAX / GLYCOLAX) packet Take 17 g by mouth daily as needed for mild constipation. 06/29/18   Gladstone Lighter, MD  potassium chloride (KLOR-CON) 20 MEQ packet Take 20 mEq by mouth daily. While on lasix 06/29/18   Gladstone Lighter, MD  pravastatin (PRAVACHOL) 20 MG tablet Take 20 mg by mouth every evening.     [provider]  predniSONE (DELTASONE) 5 MG tablet Take 7.5 mg by mouth daily.     [provider]  tiotropium (SPIRIVA) 18 MCG  inhalation capsule Place 1 capsule (18 mcg total) into inhaler and inhale daily. 12/20/16   Theodoro Grist, MD  vitamin B-12 (CYANOCOBALAMIN) 1000 MCG tablet Take 1,000 mcg by mouth daily.    [provider]  Vitamin D, Ergocalciferol, (DRISDOL) 50000 UNITS CAPS capsule Take 50,000 Units by mouth every Monday.     [provider]    Allergies Fosamax [alendronate sodium] and Wellbutrin [bupropion]  Family History  Problem Relation Age of Onset  . Hypertension Unknown   . Diabetes Unknown     Social History Social History   Tobacco Use  . Smoking status: Never Smoker  . Smokeless tobacco: Never Used  Substance Use Topics  . Alcohol use: No    Alcohol/week: 0.0 standard  drinks  . Drug use: No    Review of Systems  Constitutional: No fever/chills Eyes: No visual changes. ENT: No sore throat. Cardiovascular: Denies chest pain. Respiratory: Denies shortness of breath. Gastrointestinal: No abdominal pain.  No nausea, no vomiting.  No diarrhea.  No constipation. Genitourinary: Negative for dysuria. Musculoskeletal: Negative for back pain. Skin: Negative for rash. Neurological: Positive for post traumatic headache. ____________________________________________   PHYSICAL EXAM:  VITAL SIGNS: ED Triage Vitals  Enc Vitals Group     BP      Pulse      Resp      Temp      Temp src      SpO2      Weight      Height      Head Circumference      Peak Flow      Pain Score      Pain Loc      Pain Edu?      Excl. in [place]?     Constitutional: Dementia. Confused. Eyes: Conjunctivae are normal. Head/Face: Head atraumatic. Early ecchymosis on the right lateral forehead. Nose: No congestion/rhinnorhea. No epistaxis. Mouth/Throat: Mucous membranes are moist.  Oropharynx non-erythematous. Neck: No stridor.   Cardiovascular:   Good peripheral circulation. Respiratory: Normal respiratory effort.  No retractions. Lungs CTAB. Gastrointestinal: Soft and nontender.  No distention. No abdominal bruits.  Musculoskeletal: Left side posterior back/thoracic area tender with ecchymosis. Neurologic: Normal speech, awake, alert,  Skin:  Skin is warm, dry and intact. Thin, scattered ecchymosis. Psychiatric: Mood and affect are normal. Speech and behavior are normal.  ____________________________________________   LABS (all labs ordered are listed, but only abnormal results are displayed)  Labs Reviewed  COMPREHENSIVE METABOLIC PANEL - Abnormal; Notable for the following components:      Result Value   Glucose, Bld 132 (*)    Creatinine, Ser 1.28 (*)    Total Protein 6.2 (*)    Albumin 3.0 (*)    GFR calc non Af Amer 36 (*)    GFR calc Af Amer 42 (*)    All other components within normal limits  CBC WITH DIFFERENTIAL/PLATELET - Abnormal; Notable for the following components:   RBC 3.82 (*)    Hemoglobin 10.2 (*)    HCT 35.0 (*)    MCHC 29.1 (*)    RDW 16.9 (*)    All other components within normal limits  URINALYSIS, COMPLETE (UACMP) WITH MICROSCOPIC - Abnormal; Notable for the following components:   Color, Urine YELLOW (*)    APPearance CLEAR (*)    Nitrite POSITIVE (*)    Bacteria, UA MANY (*)    All other components within normal limits  TROPONIN I   ____________________________________________  EKG  ED ECG REPORT I, Yerik Zeringue, FNP-BC, personally viewed and interpreted this ECG.   Date: 10/19/2018  EKG Time: 2049  Rate: 71  Rhythm: Sinus  Axis: No axis deviation  Intervals:left bundle branch block  ST&T Change: No ST elevation  ____________________________________________  RADIOLOGY  ED MD interpretation:    Left hip x-ray shows no fracture.  Left rib and chest shows no acute fracture.  CT head negative for acute findings.  Official radiology report(s): Dg Ribs Unilateral W/chest Left  Result Date: 10/19/2018 CLINICAL DATA:  Fall today.  Left-sided rib pain. EXAM: LEFT RIBS AND CHEST - 3+ VIEW COMPARISON:   Two-view chest x-ray 08/14/2018 FINDINGS: The heart is enlarged. There is no edema or effusions. Aortic atherosclerosis is present. No  significant airspace consolidation is present. Dedicated imaging of the ribs demonstrates a remote tenth lateral rib fracture. No acute fractures are present. There is no pneumothorax. Advanced degenerative changes are noted in the shoulders, right greater than left. IMPRESSION: 1. No acute fracture. 2. Remote left tenth rib fracture. 3. Cardiomegaly without failure. 4. Aortic atherosclerosis. 5. Advanced degenerative changes of the shoulders, right greater than left. Electronically Signed   By: San Morelle M.D.   On: 10/19/2018 21:53   Ct Head Wo Contrast  Result Date: 10/19/2018 CLINICAL DATA:  Posttraumatic headache EXAM: CT HEAD WITHOUT CONTRAST TECHNIQUE: Contiguous axial images were obtained from the base of the skull through the vertex without intravenous contrast. COMPARISON:  Head CT 04/30/2018 FINDINGS: Brain: There is no mass, hemorrhage or extra-axial collection. The size and configuration of the ventricles and extra-axial CSF spaces are normal. Areas of hypoattenuation of the deep gray nuclei and confluent periventricular white matter hypodensity, consistent with chronic small vessel disease. Vascular: Atherosclerotic calcification of the internal carotid arteries at the skull base. No abnormal hyperdensity of the major intracranial arteries or dural venous sinuses. Skull: The visualized skull base, calvarium and extracranial soft tissues are normal. Sinuses/Orbits: No fluid levels or advanced mucosal thickening of the visualized paranasal sinuses. No mastoid or middle ear effusion. The orbits are normal. IMPRESSION: 1. No acute intracranial abnormality. 2. Findings of chronic ischemic microangiopathy. Electronically Signed   By: Ulyses Jarred M.D.   On: 10/19/2018 22:12   Dg Hip Unilat With Pelvis 2-3 Views Left  Result Date: 10/19/2018 CLINICAL DATA:   Fall EXAM: DG HIP (WITH OR WITHOUT PELVIS) 2-3V LEFT COMPARISON:  None. FINDINGS: There are bilateral total hip arthroplasties, as well as a screw and plate fixation of the lateral right iliac bone. No abnormal perihardware lucency. No acute fracture or dislocation. IMPRESSION: No acute fracture or dislocation of the left hip or pelvis. Electronically Signed   By: Ulyses Jarred M.D.   On: 10/19/2018 22:26    ____________________________________________   PROCEDURES  Procedure(s) performed: None  Procedures  Critical Care performed: No  ____________________________________________   INITIAL IMPRESSION / ASSESSMENT AND PLAN / ED COURSE  As part of my medical decision making, I reviewed the following data within the electronic MEDICAL RECORD NUMBER Notes from 08/14/18 visit to urology.   84 year old female presenting to the emergency department for treatment and evaluation of confusion, 2 falls today, and weakness.  The daughter states that she is too weak to get up and walk, so she just sits in her chair all day.  Daughter states that she sits in a wet depends and continuously gets urinary tract infections.  The note from September 20 visit with Dr. Hollice Espy was reviewed.  She questioned whether the urine specimens were contaminated and an adequate specimen.  Today, the urine was obtained via in and out cath by nursing staff.  It does appear that she has acute cystitis.  Review of the previous urine culture shows the infection was susceptible to Ancef.  She will be given a dose of IV Ancef and will plan for observation in the hospital. Case discussed with Dr. Bjorn Loser who will bring her in for observation and possible palliative care consult.    ____________________________________________   FINAL CLINICAL IMPRESSION(S) / ED DIAGNOSES  Final diagnoses:  Pain  Weakness  Acute recurrent cystitis  Confusion     ED Discharge Orders    None       Note:  This document was prepared  using Systems analyst and may include unintentional dictation errors.    Victorino Dike, FNP 10/20/18 0100    Lavonia Drafts, MD 10/22/18 [id number]

## 2018-10-19 NOTE — ED Notes (Signed)
This RN made unsuccessful attempt at PIV insertion. Patient transported to CT. Claiborne Billings RN will attempt PIV insertion and blood specimen collection upon return.

## 2018-10-19 NOTE — ED Notes (Addendum)
ED Provider at bedside. 

## 2018-10-20 ENCOUNTER — Encounter: Payer: Self-pay | Admitting: Internal Medicine

## 2018-10-20 DIAGNOSIS — E860 Dehydration: Secondary | ICD-10-CM | POA: Diagnosis not present

## 2018-10-20 DIAGNOSIS — R4182 Altered mental status, unspecified: Secondary | ICD-10-CM | POA: Diagnosis not present

## 2018-10-20 DIAGNOSIS — N390 Urinary tract infection, site not specified: Secondary | ICD-10-CM | POA: Diagnosis present

## 2018-10-20 DIAGNOSIS — W07XXXA Fall from chair, initial encounter: Secondary | ICD-10-CM | POA: Diagnosis not present

## 2018-10-20 LAB — GLUCOSE, CAPILLARY
Glucose-Capillary: 114 mg/dL — ABNORMAL HIGH (ref 70–99)
Glucose-Capillary: 176 mg/dL — ABNORMAL HIGH (ref 70–99)
Glucose-Capillary: 199 mg/dL — ABNORMAL HIGH (ref 70–99)
Glucose-Capillary: 177 mg/dL — ABNORMAL HIGH (ref 70–99)

## 2018-10-20 LAB — PHOSPHORUS: Phosphorus: 4.6 mg/dL (ref 2.5–4.6)

## 2018-10-20 LAB — MAGNESIUM: Magnesium: 2.1 mg/dL (ref 1.7–2.4)

## 2018-10-20 LAB — PREALBUMIN: PREALBUMIN: 9.9 mg/dL — AB (ref 18–38)

## 2018-10-20 MED ORDER — POLYETHYLENE GLYCOL 3350 17 G PO PACK
17.0000 g | PACK | Freq: Every day | ORAL | Status: DC
  Administered 2018-10-20 – 2018-10-21 (×2): 17 g via ORAL
  Filled 2018-10-20 (×2): qty 1

## 2018-10-20 MED ORDER — OXYCODONE HCL 5 MG PO TABS
5.0000 mg | ORAL_TABLET | Freq: Once | ORAL | Status: AC
  Administered 2018-10-20: 5 mg via ORAL
  Filled 2018-10-20: qty 1

## 2018-10-20 MED ORDER — APIXABAN 5 MG PO TABS
5.0000 mg | ORAL_TABLET | Freq: Two times a day (BID) | ORAL | Status: DC
  Administered 2018-10-20 – 2018-10-21 (×3): 5 mg via ORAL
  Filled 2018-10-20 (×3): qty 1

## 2018-10-20 MED ORDER — ATENOLOL 25 MG PO TABS
25.0000 mg | ORAL_TABLET | Freq: Two times a day (BID) | ORAL | Status: DC
  Administered 2018-10-20 – 2018-10-21 (×3): 25 mg via ORAL
  Filled 2018-10-20 (×4): qty 1

## 2018-10-20 MED ORDER — LACTATED RINGERS IV SOLN
INTRAVENOUS | Status: AC
  Administered 2018-10-20: 04:00:00 via INTRAVENOUS

## 2018-10-20 MED ORDER — FUROSEMIDE 20 MG PO TABS
20.0000 mg | ORAL_TABLET | Freq: Every day | ORAL | Status: DC
  Administered 2018-10-20 – 2018-10-21 (×2): 20 mg via ORAL
  Filled 2018-10-20 (×2): qty 1

## 2018-10-20 MED ORDER — ASPIRIN EC 81 MG PO TBEC
81.0000 mg | DELAYED_RELEASE_TABLET | Freq: Every day | ORAL | Status: DC
  Administered 2018-10-20 – 2018-10-21 (×2): 81 mg via ORAL
  Filled 2018-10-20 (×2): qty 1

## 2018-10-20 MED ORDER — BISACODYL 5 MG PO TBEC: 5.0000 mg | DELAYED_RELEASE_TABLET | Freq: Every day | ORAL | Status: DC | PRN

## 2018-10-20 MED ORDER — AMLODIPINE BESYLATE 5 MG PO TABS
5.0000 mg | ORAL_TABLET | Freq: Every day | ORAL | Status: DC
  Administered 2018-10-20 – 2018-10-21 (×2): 5 mg via ORAL
  Filled 2018-10-20 (×2): qty 1

## 2018-10-20 MED ORDER — TIOTROPIUM BROMIDE MONOHYDRATE 18 MCG IN CAPS
18.0000 ug | ORAL_CAPSULE | Freq: Every day | RESPIRATORY_TRACT | Status: DC
  Administered 2018-10-20 – 2018-10-21 (×2): 18 ug via RESPIRATORY_TRACT
  Filled 2018-10-20: qty 5

## 2018-10-20 MED ORDER — ONDANSETRON HCL 4 MG PO TABS: 4.0000 mg | ORAL_TABLET | Freq: Four times a day (QID) | ORAL | Status: DC | PRN

## 2018-10-20 MED ORDER — INSULIN ASPART 100 UNIT/ML ~~LOC~~ SOLN: 0.0000 [IU] | Freq: Every day | SUBCUTANEOUS | Status: DC

## 2018-10-20 MED ORDER — GABAPENTIN 300 MG PO CAPS: 900.0000 mg | ORAL_CAPSULE | Freq: Every evening | ORAL | Status: DC

## 2018-10-20 MED ORDER — FLUOXETINE HCL 20 MG PO CAPS
20.0000 mg | ORAL_CAPSULE | Freq: Every day | ORAL | Status: DC
  Administered 2018-10-20 – 2018-10-21 (×2): 20 mg via ORAL
  Filled 2018-10-20 (×2): qty 1

## 2018-10-20 MED ORDER — PREDNISONE 5 MG PO TABS
7.5000 mg | ORAL_TABLET | Freq: Every day | ORAL | Status: DC
  Administered 2018-10-20 – 2018-10-21 (×2): 7.5 mg via ORAL
  Filled 2018-10-20 (×2): qty 1

## 2018-10-20 MED ORDER — SENNOSIDES-DOCUSATE SODIUM 8.6-50 MG PO TABS: 1.0000 | ORAL_TABLET | Freq: Every evening | ORAL | Status: DC | PRN

## 2018-10-20 MED ORDER — PREDNISONE 5 MG PO TABS: 7.5000 mg | ORAL_TABLET | Freq: Every day | ORAL | Status: DC

## 2018-10-20 MED ORDER — PRAVASTATIN SODIUM 20 MG PO TABS
20.0000 mg | ORAL_TABLET | Freq: Every evening | ORAL | Status: DC
  Administered 2018-10-20 – 2018-10-21 (×2): 20 mg via ORAL
  Filled 2018-10-20 (×2): qty 1

## 2018-10-20 MED ORDER — ONDANSETRON HCL 4 MG/2ML IJ SOLN: 4.0000 mg | Freq: Four times a day (QID) | INTRAMUSCULAR | Status: DC | PRN

## 2018-10-20 MED ORDER — POTASSIUM CHLORIDE 20 MEQ PO PACK
20.0000 meq | PACK | Freq: Every day | ORAL | Status: DC
  Administered 2018-10-20 – 2018-10-21 (×2): 20 meq via ORAL
  Filled 2018-10-20 (×2): qty 1

## 2018-10-20 MED ORDER — OXYCODONE HCL 5 MG PO TABS
5.0000 mg | ORAL_TABLET | ORAL | Status: DC | PRN
  Administered 2018-10-21: 5 mg via ORAL
  Administered 2018-10-21: 10 mg via ORAL
  Filled 2018-10-20: qty 1
  Filled 2018-10-20: qty 2

## 2018-10-20 MED ORDER — GABAPENTIN 300 MG PO CAPS
300.0000 mg | ORAL_CAPSULE | Freq: Every morning | ORAL | Status: DC
  Administered 2018-10-20 – 2018-10-21 (×2): 300 mg via ORAL
  Filled 2018-10-20 (×2): qty 1

## 2018-10-20 MED ORDER — SODIUM CHLORIDE 0.9 % IV SOLN
1.0000 g | INTRAVENOUS | Status: DC
  Administered 2018-10-20 – 2018-10-21 (×2): 1 g via INTRAVENOUS
  Filled 2018-10-20 (×2): qty 1

## 2018-10-20 MED ORDER — ALBUTEROL SULFATE (2.5 MG/3ML) 0.083% IN NEBU: 2.5000 mg | INHALATION_SOLUTION | RESPIRATORY_TRACT | Status: DC | PRN

## 2018-10-20 MED ORDER — CALCIUM CARBONATE-VITAMIN D 500-200 MG-UNIT PO TABS
1.0000 | ORAL_TABLET | Freq: Every day | ORAL | Status: DC
  Administered 2018-10-20 – 2018-10-21 (×2): 1 via ORAL
  Filled 2018-10-20 (×2): qty 1

## 2018-10-20 MED ORDER — ACETAMINOPHEN 325 MG PO TABS: 650.0000 mg | ORAL_TABLET | Freq: Four times a day (QID) | ORAL | Status: DC | PRN

## 2018-10-20 MED ORDER — ACETAMINOPHEN 650 MG RE SUPP: 650.0000 mg | Freq: Four times a day (QID) | RECTAL | Status: DC | PRN

## 2018-10-20 MED ORDER — INSULIN ASPART 100 UNIT/ML ~~LOC~~ SOLN
0.0000 [IU] | Freq: Three times a day (TID) | SUBCUTANEOUS | Status: DC
  Administered 2018-10-20: 2 [IU] via SUBCUTANEOUS
  Administered 2018-10-20: 1 [IU] via SUBCUTANEOUS
  Administered 2018-10-21 (×2): 2 [IU] via SUBCUTANEOUS
  Administered 2018-10-21: 1 [IU] via SUBCUTANEOUS
  Filled 2018-10-20 (×5): qty 1

## 2018-10-20 MED ORDER — VITAMIN B-12 1000 MCG PO TABS
1000.0000 ug | ORAL_TABLET | Freq: Every day | ORAL | Status: DC
  Administered 2018-10-20 – 2018-10-21 (×2): 1000 ug via ORAL
  Filled 2018-10-20 (×2): qty 1

## 2018-10-20 MED ORDER — GABAPENTIN 300 MG PO CAPS
300.0000 mg | ORAL_CAPSULE | Freq: Every evening | ORAL | Status: DC
  Administered 2018-10-20 – 2018-10-21 (×2): 300 mg via ORAL
  Filled 2018-10-20 (×2): qty 1

## 2018-10-20 NOTE — Care Management (Signed)
Reported that patient lives at home with daughter [name] who is POA, and her son in [place].  No family is at bedside currently.  The number listed for [name] is the home phone and no one was available.   RNCM called daughter Jacqlyn Larsen.  Jacqlyn Larsen confirms that patient lives with [name].  However [name] has left out of town today,and will not be back for a week.   Per Jacqlyn Larsen she is not sure what the plan for her mom is at discharge.  PT consult pending.  Jacqlyn Larsen states that she is meeting with her other sister Barnetta Chapel tonight to discuss her mother's disposition.    Becky provided me with 2 contact numbers for carol, and 1 for carol's son.  The 1st two numbers did not have an option for voicemail.  I left a voicemail on [name]'s sons phone. Awaiting return call

## 2018-10-20 NOTE — Care Management Obs Status (Signed)
[place] NOTIFICATION   Patient Details  [name] MRN: [id number] Date of Birth: Jun 25, 1929   Medicare Observation Status Notification Given:  No(Daughter Becky declined to reivew. Requested that I speak with daughter carol.  I not have not been able to reach [place].  Message left )    Beverly Sessions, RN 10/20/2018, 3:56 PM

## 2018-10-20 NOTE — Evaluation (Signed)
Physical Therapy Evaluation Patient Details [name] MRN: [id number] DOB: October 29, 1929 Today's Date: 10/20/2018   History of Present Illness  Pt is a 84 y/o F who presented s/p fall x2.  Pt with recent agitation and memory impairment per daughter.  Pt found to have UTI.  CT head negative.  Imaging of hip negative.  Pt found to have remote L 10th rib fx.  Pt's PMH includes renal cancer, COPD, osteopenia, Bil THA.      Clinical Impression  Pt admitted with above diagnosis. Pt currently with functional limitations due to the deficits listed below (see PT Problem List). Pt currently requires mod +2 assist with bed mobility and min +2 assist with transfers.  Pt not at her baseline level of mobility.  Given pt's current mobility status, recommending SNF at d/c.   Pt will benefit from skilled PT to increase their independence and safety with mobility to allow discharge to the venue listed below.      Follow Up Recommendations SNF    Equipment Recommendations  Other (comment)(TBD at next venue of care)    Recommendations for Other Services       Precautions / Restrictions Precautions Precautions: Other (comment);Fall Precaution Comments: 10th rib fx Restrictions Weight Bearing Restrictions: No      Mobility  Bed Mobility Overal bed mobility: Needs Assistance Bed Mobility: Supine to Sit     Supine to sit: +2 for physical assistance;HOB elevated;Mod assist     General bed mobility comments: Assist for all aspects of bed mobility.    Transfers Overall transfer level: Needs assistance Equipment used: Rolling walker (2 wheeled) Transfers: Sit to/from [facility] Sit to Stand: Min assist;+2 physical assistance Stand pivot transfers: Min assist;+2 physical assistance       General transfer comment: Assist to boost to standing and cues for proper hand placement.  Pt does not reach back for armrests when preparing to sit despite verbal cues.  Pt anxious with  OOB activity and directional cues provided when pivoting to chair.    Ambulation/Gait             General Gait Details: Not safe to attempt at this time due to pt's anxiety and fatigue.   Stairs            Wheelchair Mobility    Modified Rankin (Stroke Patients Only)       Balance Overall balance assessment: Needs assistance Sitting-balance support: Single extremity supported;Feet supported Sitting balance-Leahy Scale: Poor Sitting balance - Comments: Relies on at least 1UE support while sitting EOB   Standing balance support: Bilateral upper extremity supported;During functional activity Standing balance-Leahy Scale: Poor Standing balance comment: Pt relies on use of RW and physical assist for static and dynamic activities                             Pertinent Vitals/Pain Pain Assessment: Faces Pain Score: 0-No pain Pain Location: No signs of pain Pain Intervention(s): Monitored during session    Home Living Family/patient expects to be discharged to:: Private residence Living Arrangements: Children(Lives with daughter and son in [place]) Available Help at Discharge: Family Type of Home: House Home Access: Stairs to enter Entrance Stairs-Rails: Right Entrance Stairs-Number of Steps: 2 Home Layout: One level Home Equipment: Environmental consultant - 2 wheels      Prior Function Level of Independence: Needs assistance   Gait / Transfers Assistance Needed: Ambulates household distances with RW.  No  additional recent falls.    ADL's / Homemaking Assistance Needed: Assist with showering.  Daughter does the cooking, cleaning.  Pt ind with dressing.         Hand Dominance        Extremity/Trunk Assessment   Upper Extremity Assessment Upper Extremity Assessment: Generalized weakness    Lower Extremity Assessment Lower Extremity Assessment: Generalized weakness    Cervical / Trunk Assessment Cervical / Trunk Assessment: Kyphotic  Communication    Communication: HOH  Cognition Arousal/Alertness: Awake/alert Behavior During Therapy: Anxious(anxious about bed mobility and OOB mobility) Overall Cognitive Status: No family/caregiver present to determine baseline cognitive functioning Area of Impairment: Memory;Safety/judgement                     Memory: Decreased short-term memory   Safety/Judgement: Decreased awareness of safety;Decreased awareness of deficits            General Comments General comments (skin integrity, edema, etc.): Vitals monitored. Pt on RA throughout. In supine SpO2 91%, with activity down as low as 87%.      Exercises General Exercises - Lower Extremity Ankle Circles/Pumps: AROM;Both;10 reps;Supine Heel Slides: AROM;Both;10 reps;Supine Straight Leg Raises: Both;10 reps;Supine;AROM;Strengthening   Assessment/Plan    PT Assessment Patient needs continued PT services  PT Problem List Decreased strength;Decreased activity tolerance;Decreased balance;Decreased mobility;Decreased cognition;Decreased knowledge of use of DME;Decreased safety awareness;Pain;Cardiopulmonary status limiting activity       PT Treatment Interventions DME instruction;Gait training;Stair training;Functional mobility training;Therapeutic exercise;Therapeutic activities;Balance training;Neuromuscular re-education;Patient/family education;Cognitive remediation;Wheelchair mobility training    PT Goals (Current goals can be found in the Care Plan section)  Acute Rehab PT Goals Patient Stated Goal: to improve strength PT Goal Formulation: With patient Time For Goal Achievement: 11/03/18 Potential to Achieve Goals: Good    Frequency Min 2X/week   Barriers to discharge Other (comment) Unsure of amount of assist available at d/c but after chart review, suspect 24/7 assist from daughter who she lives with    Co-evaluation               AM-PAC PT "6 Clicks" Mobility  Outcome Measure Help needed turning from your back  to your side while in a flat bed without using bedrails?: A Lot Help needed moving from lying on your back to sitting on the side of a flat bed without using bedrails?: A Lot Help needed moving to and from a bed to a chair (including a wheelchair)?: A Little Help needed standing up from a chair using your arms (e.g., wheelchair or bedside chair)?: A Lot Help needed to walk in hospital room?: A Lot Help needed climbing 3-5 steps with a railing? : A Lot 6 Click Score: 13    End of Session Equipment Utilized During Treatment: Gait belt Activity Tolerance: Patient limited by fatigue;Other (comment)(limited by anxiety) Patient left: in chair;with call bell/phone within reach;with chair alarm set Nurse Communication: Mobility status;Other (comment)(battery in chair alarm box low) PT Visit Diagnosis: Muscle weakness (generalized) (M62.81);Repeated falls (R29.6);Unsteadiness on feet (R26.81);Other abnormalities of gait and mobility (R26.89)    Time: 8119-1478 PT Time Calculation (min) (ACUTE ONLY): 32 min   Charges:   PT Evaluation $PT Eval Moderate Complexity: 1 Mod PT Treatments $Therapeutic Activity: 8-22 mins        Session was performed by student PT, Belva Crome, and directed, overseen, and documented by this PT.  Collie Siad PT, DPT 10/20/2018, 4:24 PM

## 2018-10-20 NOTE — H&P (Addendum)
[facility] [place] [name]    MR#:  [id number]  DATE OF BIRTH:  Aug 14, 1929  DATE OF ADMISSION:  10/19/2018  PRIMARY CARE PHYSICIAN: [name], MD   REQUESTING/REFERRING PHYSICIAN: Victorino Dike, FNP  CHIEF COMPLAINT:   Chief Complaint  Patient presents with  . Fall    HISTORY OF PRESENT ILLNESS:  Tracy Mcmahon  is a 84 y.o. female with a known history of obesity, T2IDDM, HTN, HLD, DVT (Eliquis), CKD III, COPD, PMR, Hx RCC p/w AMS, falls. Pt is very angry at her ED nurse for drawing blood, and will not provide Hx/ROS. She becomes agitated when asked orientation (or any other) questions, but is cooperative and non-combative for physical exam. Hx and limited ROS obtained from pt's daughter. Pt's daughter states that pt has had recurrent UTI in the past several months, and was admitted 06/06, 08/02 and 09/20 w/ UTI and hypoglycemia. On present admission, pt is not hypoglycemic, but U/A (+) nitrite, bacteria [though (-) leuk est, RBC, WBC, protein]. Pt is incontinent of urine at baseline, wears adult Depends at home. Urologist Hollice Espy) recommended regular scheduled toileting q2h, but pt's daughter states there are volitional issues, w/ pt not wanting to get up to go to the bathroom. Pt has reportedly had intermittent memory/orientation issues, as well as irrational anger and behavioral issues. She has been sleeping more. She still eats very well. She likely has undiagnosed mild/early dementia. She ambulates w/ walker at baseline. She fell out of her chair during the day, and has some bruising/soreness. There was also reportedly increased AMS/confusion during the day. These factors prompted evaluation. Trauma imaging performed in ED, including CT head, was (-) acute abnl. U/A (+) as noted above. Dehydrated. Pt intermittently c/o musculoskeletal pain.  PAST MEDICAL HISTORY:   Past Medical History:  Diagnosis Date  . Cancer ([place])    . Cataract   . Chronic kidney disease (CKD), stage III (moderate) ([facility])   . Chronic urticaria   . COPD (chronic obstructive pulmonary disease) ([place])   . Depression   . Diabetes mellitus without complication ([place])   . DVT (deep venous thrombosis) ([facility])    left leg  . Hyperlipemia   . Hypertension   . Osteoarthritis   . Osteopenia   . Polymyalgia rheumatica ([place])   . Polyneuropathy   . Renal cancer ([place])   . Renal insufficiency 08/10/2016   Chart indicates CKD stage 3  . Restless leg syndrome   . Vitamin D deficiency   . Vulvar intraepithelial neoplasia with lichen sclerosus     PAST SURGICAL HISTORY:   Past Surgical History:  Procedure Laterality Date  . ABDOMINAL HYSTERECTOMY    . APPENDECTOMY    . CATARACT EXTRACTION    . COLONOSCOPY    . JOINT REPLACEMENT Bilateral    hip  . LAMINOTOMY    . NEPHRECTOMY    . TEMPORAL ARTERY BIOPSY / LIGATION    . TONSILLECTOMY      SOCIAL HISTORY:   Social History   Tobacco Use  . Smoking status: Never Smoker  . Smokeless tobacco: Never Used  Substance Use Topics  . Alcohol use: No    Alcohol/week: 0.0 standard drinks    FAMILY HISTORY:   Family History  Problem Relation Age of Onset  . Hypertension Unknown   . Diabetes Unknown     DRUG ALLERGIES:   Allergies  Allergen Reactions  . Fosamax [Alendronate Sodium] Other (See  Comments)    Reaction:  Made pt "very sick"  . Wellbutrin [Bupropion] Other (See Comments)    Reaction:  Unknown    REVIEW OF SYSTEMS:   Review of Systems  Unable to perform ROS: Mental status change  Musculoskeletal: Positive for falls, joint pain and myalgias.  Neurological: Positive for weakness.  Psychiatric/Behavioral: Positive for memory loss.   (+) AMS/confusion, urinary incontinence. MEDICATIONS AT HOME:   Prior to Admission medications   Medication Sig Start Date End Date Taking? Authorizing Provider  amLODipine (NORVASC) 5 MG tablet Take 1 tablet (5 mg total) by mouth  daily. 05/03/18  Yes Gladstone Lighter, MD  conjugated estrogens (PREMARIN) vaginal cream Place 1 Applicatorful vaginally daily. Use pea sized amount M-W-Fr before bedtime 10/02/18  Yes Hollice Espy, MD  insulin aspart (NOVOLOG) 100 UNIT/ML injection Inject 0-9 Units into the skin 3 (three) times daily with meals. CBG 70 - 120: 0 units CBG 121 - 150: 1 unit CBG 151 - 200: 2 units CBG 201 - 250: 3 units CBG 251 - 300: 5 units CBG 301 - 350: 7 units CBG 351 - 400: 9 units CBG > 400: call MD 08/16/18  Yes Gladstone Lighter, MD  insulin glargine (LANTUS) 100 UNIT/ML injection Inject 100 Units into the skin daily.   Yes [provider]  pravastatin (PRAVACHOL) 20 MG tablet Take 20 mg by mouth every evening.    Yes [provider]  albuterol (PROVENTIL) (2.5 MG/3ML) 0.083% nebulizer solution Take 3 mLs (2.5 mg total) by nebulization every 4 (four) hours. 12/19/16   Theodoro Grist, MD  apixaban (ELIQUIS) 5 MG TABS tablet Take 1 tablet (5 mg total) by mouth 2 (two) times daily. 05/03/18   Gladstone Lighter, MD  aspirin EC 81 MG tablet Take 81 mg by mouth daily.    [provider]  atenolol (TENORMIN) 25 MG tablet Take 25 mg by mouth 2 (two) times daily.    [provider]  azelastine (OPTIVAR) 0.05 % ophthalmic solution Place 1 drop into both eyes 2 (two) times daily as needed for itching. 06/19/18   [provider]  Calcium Carb-Cholecalciferol (CALCIUM 600/VITAMIN D3) 600-800 MG-UNIT TABS Take 600 mg by mouth daily.    [provider]  diphenhydramine-acetaminophen (TYLENOL PM) 25-500 MG TABS tablet Take 1 tablet by mouth at bedtime as needed (sleep or pain).    [provider]  docusate sodium (COLACE) 100 MG capsule Take 1 capsule (100 mg total) by mouth 2 (two) times daily. Patient not taking: Reported on 10/20/2018 06/29/18   Gladstone Lighter, MD  FLUoxetine (PROZAC) 20 MG capsule Take 20 mg by mouth daily.    [provider]    furosemide (LASIX) 20 MG tablet Take 20 mg by mouth daily.    [provider]  gabapentin (NEURONTIN) 300 MG capsule Take 1 capsule (300MG ) by mouth every morning, 1 capsule (300MG ) every evening and 3 capsules (900MG ) at bedtime    [provider]  Multiple Minerals-Vitamins (GNP CAL MAG ZINC +D3 PO) Take 1 tablet by mouth daily.    [provider]  polyethylene glycol (MIRALAX / GLYCOLAX) packet Take 17 g by mouth daily as needed for mild constipation. Patient not taking: Reported on 10/20/2018 06/29/18   Gladstone Lighter, MD  potassium chloride (KLOR-CON) 20 MEQ packet Take 20 mEq by mouth daily. While on lasix 06/29/18   Gladstone Lighter, MD  predniSONE (DELTASONE) 5 MG tablet Take 7.5 mg by mouth daily.     [provider]  vitamin B-12 (CYANOCOBALAMIN) 1000 MCG tablet Take 1,000 mcg by mouth daily.    [provider]  Vitamin D, Ergocalciferol, (DRISDOL) 50000 UNITS CAPS capsule Take 50,000 Units by mouth every Monday.     [provider]      VITAL SIGNS:  Blood pressure (!) 116/48, pulse 76, temperature 98.3 F (36.8 C), resp. rate 19, height 5' (1.524 m), weight 90.7 kg, SpO2 95 %.  PHYSICAL EXAMINATION:  Physical Exam  Constitutional: She appears well-developed and well-nourished. She is active and cooperative.  Non-toxic appearance. She does not have a sickly appearance. She does not appear ill. No distress. She is not intubated.  HENT:  Mouth/Throat: Oropharynx is clear and moist. Mucous membranes are not dry. No oropharyngeal exudate.  Eyes: Conjunctivae, EOM and lids are normal. No scleral icterus.  Neck: Neck supple. No JVD present. No thyromegaly present.  Cardiovascular: Normal rate, regular rhythm, S1 normal, S2 normal and normal heart sounds.  No extrasystoles are present. Exam reveals no gallop, no S3, no S4, no distant heart sounds and no friction rub.  No murmur heard. Pulmonary/Chest: Effort normal and breath  sounds normal. No accessory muscle usage or stridor. No apnea, no tachypnea and no bradypnea. She is not intubated. No respiratory distress. She has no decreased breath sounds. She has no wheezes. She has no rhonchi. She has no rales.  Abdominal: Soft. Bowel sounds are normal. She exhibits no distension. There is no tenderness. There is no rigidity, no rebound and no guarding.  Musculoskeletal: Normal range of motion. She exhibits no edema or tenderness.  Lymphadenopathy:    She has no cervical adenopathy.  Neurological: She is alert.  Angry/irate, will not answer my questions (as noted in HPI).  Skin: Skin is warm and dry. No rash noted. She is not diaphoretic. No erythema.  Psychiatric: Her affect is angry. She is agitated. She is noncommunicative.  Angry/irate, will not answer my questions (as noted in HPI).   LABORATORY PANEL:   CBC Recent Labs  Lab 10/19/18 2229  WBC 8.3  HGB 10.2*  HCT 35.0*  PLT 301   ------------------------------------------------------------------------------------------------------------------  Chemistries  Recent Labs  Lab 10/19/18 2229  NA 141  K 4.3  CL 104  CO2 29  GLUCOSE 132*  BUN 20  CREATININE 1.28*  CALCIUM 9.5  MG 2.1  AST 20  ALT 13  ALKPHOS 61  BILITOT 0.5   ------------------------------------------------------------------------------------------------------------------  Cardiac Enzymes Recent Labs  Lab 10/19/18 2229  TROPONINI <0.03   ------------------------------------------------------------------------------------------------------------------  RADIOLOGY:  Dg Ribs Unilateral W/chest Left  Result Date: 10/19/2018 CLINICAL DATA:  Fall today.  Left-sided rib pain. EXAM: LEFT RIBS AND CHEST - 3+ VIEW COMPARISON:  Two-view chest x-ray 08/14/2018 FINDINGS: The heart is enlarged. There is no edema or effusions. Aortic atherosclerosis is present. No significant airspace consolidation is present. Dedicated imaging of the  ribs demonstrates a remote tenth lateral rib fracture. No acute fractures are present. There is no pneumothorax. Advanced degenerative changes are noted in the shoulders, right greater than left. IMPRESSION: 1. No acute fracture. 2. Remote left tenth rib fracture. 3. Cardiomegaly without failure. 4. Aortic atherosclerosis. 5. Advanced degenerative changes of the shoulders, right greater than left. Electronically Signed   By: San Morelle M.D.   On: 10/19/2018 21:53   Ct Head Wo Contrast  Result Date: 10/19/2018 CLINICAL DATA:  Posttraumatic headache EXAM: CT HEAD WITHOUT CONTRAST TECHNIQUE: Contiguous axial images were obtained from the base of the skull through the  vertex without intravenous contrast. COMPARISON:  Head CT 04/30/2018 FINDINGS: Brain: There is no mass, hemorrhage or extra-axial collection. The size and configuration of the ventricles and extra-axial CSF spaces are normal. Areas of hypoattenuation of the deep gray nuclei and confluent periventricular white matter hypodensity, consistent with chronic small vessel disease. Vascular: Atherosclerotic calcification of the internal carotid arteries at the skull base. No abnormal hyperdensity of the major intracranial arteries or dural venous sinuses. Skull: The visualized skull base, calvarium and extracranial soft tissues are normal. Sinuses/Orbits: No fluid levels or advanced mucosal thickening of the visualized paranasal sinuses. No mastoid or middle ear effusion. The orbits are normal. IMPRESSION: 1. No acute intracranial abnormality. 2. Findings of chronic ischemic microangiopathy. Electronically Signed   By: Ulyses Jarred M.D.   On: 10/19/2018 22:12   Dg Hip Unilat With Pelvis 2-3 Views Left  Result Date: 10/19/2018 CLINICAL DATA:  Fall EXAM: DG HIP (WITH OR WITHOUT PELVIS) 2-3V LEFT COMPARISON:  None. FINDINGS: There are bilateral total hip arthroplasties, as well as a screw and plate fixation of the lateral right iliac bone. No  abnormal perihardware lucency. No acute fracture or dislocation. IMPRESSION: No acute fracture or dislocation of the left hip or pelvis. Electronically Signed   By: Ulyses Jarred M.D.   On: 10/19/2018 22:26   IMPRESSION AND PLAN:   A/P: 50F UTI, dehydration, falls. Hyperglycemia (w/ T2IDDM), Cr elevation/CKD III, hypoalbuminemia/hypoproteinemia, normocytic anemia. -UTI: SIRS (-). Ceftriaxone. UCx. -Dehydration: IVF. EF 55-65% as of 08/11/2016 Echo. -Falls: Fall precautions. P/T eval. -Hyperglycemia, T2IDDM: SSI. Pt not hypoglycemic on present admission. -Cr elevation, CKD III: Cr 1.28, at baseline. Avoid nephrotoxins. -Hypoalbuminemia/hypoproteinemia: Prealbumin. -Normocytic anemia: Hgb stable. Likely anemia of chronic disease. No evidence of active/acute blood loss at present time. -c/w home meds/formulary subs. -FEN/GI: Cardiac diabetic diet. -DVT PPx: Eliquis. -Code status: Full code. Had a lengthy discussion w/ pt's daughter (DPOA) about code status. It seems the pt and family have never discussed this properly in the past. Pt's daughter is aware, however, of her mother's advanced age and medical comorbidities, but states she will need to confer w/ her other two sisters prior to making a decision; she anticipates they will likely discuss and change pt's code status once she is discharged home. -Disposition: Observation, < 2 midnights.   All the records are reviewed and case discussed with ED provider. Management plans discussed with the patient, family and they are in agreement.  CODE STATUS: Full code.  TOTAL TIME TAKING CARE OF THIS PATIENT: 75 minutes.    Arta Silence M.D on 10/20/2018 at 2:27 AM  Between 7am to 6pm - Pager - [phone]  After 6pm go to [email or web address] - [employer] [facility]  Office  [phone]  CC: Primary care physician; [name], MD   Note: This dictation was prepared with Dragon dictation along with  smaller phrase technology. Any transcriptional errors that result from this process are unintentional.

## 2018-10-21 DIAGNOSIS — Z87440 Personal history of urinary (tract) infections: Secondary | ICD-10-CM | POA: Diagnosis not present

## 2018-10-21 DIAGNOSIS — M6281 Muscle weakness (generalized): Secondary | ICD-10-CM | POA: Diagnosis not present

## 2018-10-21 DIAGNOSIS — E1165 Type 2 diabetes mellitus with hyperglycemia: Secondary | ICD-10-CM | POA: Diagnosis not present

## 2018-10-21 DIAGNOSIS — N3000 Acute cystitis without hematuria: Secondary | ICD-10-CM | POA: Diagnosis not present

## 2018-10-21 DIAGNOSIS — D631 Anemia in chronic kidney disease: Secondary | ICD-10-CM | POA: Diagnosis not present

## 2018-10-21 DIAGNOSIS — M353 Polymyalgia rheumatica: Secondary | ICD-10-CM | POA: Diagnosis not present

## 2018-10-21 DIAGNOSIS — R0902 Hypoxemia: Secondary | ICD-10-CM | POA: Diagnosis not present

## 2018-10-21 DIAGNOSIS — N183 Chronic kidney disease, stage 3 (moderate): Secondary | ICD-10-CM | POA: Diagnosis not present

## 2018-10-21 DIAGNOSIS — N390 Urinary tract infection, site not specified: Secondary | ICD-10-CM | POA: Diagnosis not present

## 2018-10-21 DIAGNOSIS — E860 Dehydration: Secondary | ICD-10-CM | POA: Diagnosis not present

## 2018-10-21 DIAGNOSIS — R279 Unspecified lack of coordination: Secondary | ICD-10-CM | POA: Diagnosis not present

## 2018-10-21 DIAGNOSIS — J811 Chronic pulmonary edema: Secondary | ICD-10-CM | POA: Diagnosis not present

## 2018-10-21 DIAGNOSIS — M7918 Myalgia, other site: Secondary | ICD-10-CM | POA: Diagnosis not present

## 2018-10-21 DIAGNOSIS — J9620 Acute and chronic respiratory failure, unspecified whether with hypoxia or hypercapnia: Secondary | ICD-10-CM | POA: Diagnosis not present

## 2018-10-21 DIAGNOSIS — E1122 Type 2 diabetes mellitus with diabetic chronic kidney disease: Secondary | ICD-10-CM | POA: Diagnosis not present

## 2018-10-21 DIAGNOSIS — Z905 Acquired absence of kidney: Secondary | ICD-10-CM | POA: Diagnosis not present

## 2018-10-21 DIAGNOSIS — N281 Cyst of kidney, acquired: Secondary | ICD-10-CM | POA: Diagnosis not present

## 2018-10-21 DIAGNOSIS — I82409 Acute embolism and thrombosis of unspecified deep veins of unspecified lower extremity: Secondary | ICD-10-CM | POA: Diagnosis not present

## 2018-10-21 DIAGNOSIS — Z7401 Bed confinement status: Secondary | ICD-10-CM | POA: Diagnosis not present

## 2018-10-21 DIAGNOSIS — I1310 Hypertensive heart and chronic kidney disease without heart failure, with stage 1 through stage 4 chronic kidney disease, or unspecified chronic kidney disease: Secondary | ICD-10-CM | POA: Diagnosis not present

## 2018-10-21 LAB — BASIC METABOLIC PANEL
Anion gap: 7 (ref 5–15)
BUN: 21 mg/dL (ref 8–23)
CO2: 30 mmol/L (ref 22–32)
Calcium: 8.6 mg/dL — ABNORMAL LOW (ref 8.9–10.3)
Chloride: 103 mmol/L (ref 98–111)
Creatinine, Ser: 1.58 mg/dL — ABNORMAL HIGH (ref 0.44–1.00)
GFR calc Af Amer: 34 mL/min — ABNORMAL LOW (ref >60)
GFR calc non Af Amer: 29 mL/min — ABNORMAL LOW (ref >60)
GLUCOSE: 146 mg/dL — AB (ref 70–99)
POTASSIUM: 4.1 mmol/L (ref 3.5–5.1)
Sodium: 140 mmol/L (ref 135–145)

## 2018-10-21 LAB — GLUCOSE, CAPILLARY
Glucose-Capillary: 138 mg/dL — ABNORMAL HIGH (ref 70–99)
Glucose-Capillary: 176 mg/dL — ABNORMAL HIGH (ref 70–99)
Glucose-Capillary: 182 mg/dL — ABNORMAL HIGH (ref 70–99)

## 2018-10-21 MED ORDER — CEPHALEXIN 250 MG PO CAPS: 250.0000 mg | ORAL_CAPSULE | Freq: Two times a day (BID) | ORAL | 0 refills | Status: AC

## 2018-10-21 MED ORDER — INSULIN GLARGINE 100 UNIT/ML ~~LOC~~ SOLN: 25.0000 [IU] | Freq: Every day | SUBCUTANEOUS | 11 refills | Status: DC

## 2018-10-21 MED ORDER — CEPHALEXIN 250 MG PO CAPS
250.0000 mg | ORAL_CAPSULE | Freq: Two times a day (BID) | ORAL | Status: DC
  Administered 2018-10-21: 250 mg via ORAL
  Filled 2018-10-21 (×2): qty 1

## 2018-10-21 NOTE — Discharge Summary (Signed)
[facility] [place] [name]    MR#:  [id number]  DATE OF BIRTH:  04/25/1929  DATE OF ADMISSION:  10/19/2018 ADMITTING PHYSICIAN: Arta Silence, MD  DATE OF DISCHARGE: 10/21/2018  PRIMARY CARE PHYSICIAN: [name], MD    ADMISSION DIAGNOSIS:  Confusion [R41.0] Weakness [R53.1] Pain [R52] Acute recurrent cystitis [N30.00]  DISCHARGE DIAGNOSIS:  Active Problems:   Recurrent UTI   SECONDARY DIAGNOSIS:   Past Medical History:  Diagnosis Date  . Cancer ([place])   . Cataract   . Chronic kidney disease (CKD), stage III (moderate) (HCC)   . Chronic urticaria   . COPD (chronic obstructive pulmonary disease) ([place])   . Depression   . Diabetes mellitus without complication ([place])   . DVT (deep venous thrombosis) (HCC)    left leg  . Hyperlipemia   . Hypertension   . Osteoarthritis   . Osteopenia   . Polymyalgia rheumatica ([place])   . Polyneuropathy   . Renal cancer ([place])   . Renal insufficiency 08/10/2016   Chart indicates CKD stage 3  . Restless leg syndrome   . Vitamin D deficiency   . Vulvar intraepithelial neoplasia with lichen sclerosus     HOSPITAL COURSE:  84 year old female with history of recurrent UTIs who presented after a fall and found to have urinary tract infection.  1  Recurrent UTI: Patient is followed by urology as outpatient.  Last urine culture was positive For E. coli which is pansensitive.  Patient will be discharged on oral Keflex. We asked that PCP follow-up with final urine culture results.  2.  Diabetes: For now patient will be discharged on Lantus 25 units daily.  She is supposed to be on 40 units daily however this may be too much for her according to her blood sugars while in the hospital. 3.  Essential hypertension: Continue Norvasc and atenolol  4.  History of DVT: According to patient's PCP notes patient still on Eliquis and we will continue this  5.  Chronic kidney disease stage  III: Creatinine remains relatively stable.  Patient will follow-up with her PCP DISCHARGE CONDITIONS AND DIET:   Stable on diabetic diet  CONSULTS OBTAINED:  Treatment Team:  Arta Silence, MD  DRUG ALLERGIES:   Allergies  Allergen Reactions  . Fosamax [Alendronate Sodium] Other (See Comments)    Reaction:  Made pt "very sick"  . Wellbutrin [Bupropion] Other (See Comments)    Reaction:  Unknown    DISCHARGE MEDICATIONS:   Allergies as of 10/21/2018      Reactions   Fosamax [alendronate Sodium] Other (See Comments)   Reaction:  Made pt "very sick"   Wellbutrin [bupropion] Other (See Comments)   Reaction:  Unknown      Medication List    STOP taking these medications   azelastine 0.05 % ophthalmic solution Commonly known as:  OPTIVAR   docusate sodium 100 MG capsule Commonly known as:  COLACE   polyethylene glycol packet Commonly known as:  MIRALAX / GLYCOLAX   potassium chloride 20 MEQ packet Commonly known as:  KLOR-CON     TAKE these medications   albuterol (2.5 MG/3ML) 0.083% nebulizer solution Commonly known as:  PROVENTIL Take 3 mLs (2.5 mg total) by nebulization every 4 (four) hours.   amLODipine 5 MG tablet Commonly known as:  NORVASC Take 1 tablet (5 mg total) by mouth daily.   apixaban 5 MG Tabs tablet Commonly known as:  ELIQUIS Take 1 tablet (  5 mg total) by mouth 2 (two) times daily.   aspirin EC 81 MG tablet Take 81 mg by mouth daily.   atenolol 25 MG tablet Commonly known as:  TENORMIN Take 25 mg by mouth 2 (two) times daily.   CALCIUM 600/VITAMIN D3 600-800 MG-UNIT Tabs Generic drug:  Calcium Carb-Cholecalciferol Take 600 mg by mouth daily.   cephALEXin 250 MG capsule Commonly known as:  KEFLEX Take 1 capsule (250 mg total) by mouth every 12 (twelve) hours for 4 days.   conjugated estrogens vaginal cream Commonly known as:  PREMARIN Place 1 Applicatorful vaginally daily. Use pea sized amount M-W-Fr before bedtime    diphenhydramine-acetaminophen 25-500 MG Tabs tablet Commonly known as:  TYLENOL PM Take 1 tablet by mouth at bedtime as needed (sleep or pain).   FLUoxetine 20 MG capsule Commonly known as:  PROZAC Take 20 mg by mouth daily.   furosemide 20 MG tablet Commonly known as:  LASIX Take 20 mg by mouth daily.   gabapentin 300 MG capsule Commonly known as:  NEURONTIN Take 1 capsule (300MG ) by mouth every morning, 1 capsule (300MG ) every evening and 3 capsules (900MG ) at bedtime   GNP CAL MAG ZINC +D3 PO Take 1 tablet by mouth daily.   insulin aspart 100 UNIT/ML injection Commonly known as:  novoLOG Inject 0-9 Units into the skin 3 (three) times daily with meals. CBG 70 - 120: 0 units CBG 121 - 150: 1 unit CBG 151 - 200: 2 units CBG 201 - 250: 3 units CBG 251 - 300: 5 units CBG 301 - 350: 7 units CBG 351 - 400: 9 units CBG > 400: call MD   insulin glargine 100 UNIT/ML injection Commonly known as:  LANTUS Inject 0.25 mLs (25 Units total) into the skin daily. What changed:  how much to take   pravastatin 20 MG tablet Commonly known as:  PRAVACHOL Take 20 mg by mouth every evening.   predniSONE 5 MG tablet Commonly known as:  DELTASONE Take 7.5 mg by mouth daily.   vitamin B-12 1000 MCG tablet Commonly known as:  CYANOCOBALAMIN Take 1,000 mcg by mouth daily.   Vitamin D (Ergocalciferol) 1.25 MG (50000 UT) Caps capsule Commonly known as:  DRISDOL Take 50,000 Units by mouth every Monday.         Today   CHIEF COMPLAINT:  No acute issues overnight    VITAL SIGNS:  Blood pressure (!) 141/47, pulse 67, temperature 98.6 F (37 C), temperature source Axillary, resp. rate 20, height 5' (1.524 m), weight 90.7 kg, SpO2 92 %.   REVIEW OF SYSTEMS:  Review of Systems  Constitutional: Negative.  Negative for chills, fever and malaise/fatigue.  HENT: Negative.  Negative for ear discharge, ear pain, hearing loss, nosebleeds and sore throat.   Eyes: Negative.  Negative  for blurred vision and pain.  Respiratory: Negative.  Negative for cough, hemoptysis, shortness of breath and wheezing.   Cardiovascular: Negative.  Negative for chest pain, palpitations and leg swelling.  Gastrointestinal: Negative.  Negative for abdominal pain, blood in stool, diarrhea, nausea and vomiting.  Genitourinary: Negative.  Negative for dysuria.  Musculoskeletal: Negative.  Negative for back pain.  Skin: Negative.   Neurological: Negative for dizziness, tremors, speech change, focal weakness, seizures and headaches.  Endo/Heme/Allergies: Negative.  Does not bruise/bleed easily.  Psychiatric/Behavioral: Positive for memory loss. Negative for depression, hallucinations and suicidal ideas.     PHYSICAL EXAMINATION:  GENERAL:  84 y.o.-year-old patient lying in the bed with no  acute distress.  NECK:  Supple, no jugular venous distention. No thyroid enlargement, no tenderness.  LUNGS: Normal breath sounds bilaterally, no wheezing, rales,rhonchi  No use of accessory muscles of respiration.  CARDIOVASCULAR: S1, S2 normal. No murmurs, rubs, or gallops.  ABDOMEN: Soft, non-tender, non-distended. Bowel sounds present. No organomegaly or mass.  EXTREMITIES: No pedal edema, cyanosis, or clubbing.  PSYCHIATRIC: The patient is alert and oriented x 3.  SKIN: No obvious rash, lesion, or ulcer.   DATA REVIEW:   CBC Recent Labs  Lab 10/19/18 2229  WBC 8.3  HGB 10.2*  HCT 35.0*  PLT 301    Chemistries  Recent Labs  Lab 10/19/18 2229 10/21/18 0403  NA 141 140  K 4.3 4.1  CL 104 103  CO2 29 30  GLUCOSE 132* 146*  BUN 20 21  CREATININE 1.28* 1.58*  CALCIUM 9.5 8.6*  MG 2.1  --   AST 20  --   ALT 13  --   ALKPHOS 61  --   BILITOT 0.5  --     Cardiac Enzymes Recent Labs  Lab 10/19/18 2229  TROPONINI <0.03    Microbiology Results  @MICRORSLT48 @  RADIOLOGY:  Dg Ribs Unilateral W/chest Left  Result Date: 10/19/2018 CLINICAL DATA:  Fall today.  Left-sided rib pain.  EXAM: LEFT RIBS AND CHEST - 3+ VIEW COMPARISON:  Two-view chest x-ray 08/14/2018 FINDINGS: The heart is enlarged. There is no edema or effusions. Aortic atherosclerosis is present. No significant airspace consolidation is present. Dedicated imaging of the ribs demonstrates a remote tenth lateral rib fracture. No acute fractures are present. There is no pneumothorax. Advanced degenerative changes are noted in the shoulders, right greater than left. IMPRESSION: 1. No acute fracture. 2. Remote left tenth rib fracture. 3. Cardiomegaly without failure. 4. Aortic atherosclerosis. 5. Advanced degenerative changes of the shoulders, right greater than left. Electronically Signed   By: San Morelle M.D.   On: 10/19/2018 21:53   Ct Head Wo Contrast  Result Date: 10/19/2018 CLINICAL DATA:  Posttraumatic headache EXAM: CT HEAD WITHOUT CONTRAST TECHNIQUE: Contiguous axial images were obtained from the base of the skull through the vertex without intravenous contrast. COMPARISON:  Head CT 04/30/2018 FINDINGS: Brain: There is no mass, hemorrhage or extra-axial collection. The size and configuration of the ventricles and extra-axial CSF spaces are normal. Areas of hypoattenuation of the deep gray nuclei and confluent periventricular white matter hypodensity, consistent with chronic small vessel disease. Vascular: Atherosclerotic calcification of the internal carotid arteries at the skull base. No abnormal hyperdensity of the major intracranial arteries or dural venous sinuses. Skull: The visualized skull base, calvarium and extracranial soft tissues are normal. Sinuses/Orbits: No fluid levels or advanced mucosal thickening of the visualized paranasal sinuses. No mastoid or middle ear effusion. The orbits are normal. IMPRESSION: 1. No acute intracranial abnormality. 2. Findings of chronic ischemic microangiopathy. Electronically Signed   By: Ulyses Jarred M.D.   On: 10/19/2018 22:12   Dg Hip Unilat With Pelvis 2-3  Views Left  Result Date: 10/19/2018 CLINICAL DATA:  Fall EXAM: DG HIP (WITH OR WITHOUT PELVIS) 2-3V LEFT COMPARISON:  None. FINDINGS: There are bilateral total hip arthroplasties, as well as a screw and plate fixation of the lateral right iliac bone. No abnormal perihardware lucency. No acute fracture or dislocation. IMPRESSION: No acute fracture or dislocation of the left hip or pelvis. Electronically Signed   By: Ulyses Jarred M.D.   On: 10/19/2018 22:26      Allergies as of  10/21/2018      Reactions   Fosamax [alendronate Sodium] Other (See Comments)   Reaction:  Made pt "very sick"   Wellbutrin [bupropion] Other (See Comments)   Reaction:  Unknown      Medication List    STOP taking these medications   azelastine 0.05 % ophthalmic solution Commonly known as:  OPTIVAR   docusate sodium 100 MG capsule Commonly known as:  COLACE   polyethylene glycol packet Commonly known as:  MIRALAX / GLYCOLAX   potassium chloride 20 MEQ packet Commonly known as:  KLOR-CON     TAKE these medications   albuterol (2.5 MG/3ML) 0.083% nebulizer solution Commonly known as:  PROVENTIL Take 3 mLs (2.5 mg total) by nebulization every 4 (four) hours.   amLODipine 5 MG tablet Commonly known as:  NORVASC Take 1 tablet (5 mg total) by mouth daily.   apixaban 5 MG Tabs tablet Commonly known as:  ELIQUIS Take 1 tablet (5 mg total) by mouth 2 (two) times daily.   aspirin EC 81 MG tablet Take 81 mg by mouth daily.   atenolol 25 MG tablet Commonly known as:  TENORMIN Take 25 mg by mouth 2 (two) times daily.   CALCIUM 600/VITAMIN D3 600-800 MG-UNIT Tabs Generic drug:  Calcium Carb-Cholecalciferol Take 600 mg by mouth daily.   cephALEXin 250 MG capsule Commonly known as:  KEFLEX Take 1 capsule (250 mg total) by mouth every 12 (twelve) hours for 4 days.   conjugated estrogens vaginal cream Commonly known as:  PREMARIN Place 1 Applicatorful vaginally daily. Use pea sized amount M-W-Fr  before bedtime   diphenhydramine-acetaminophen 25-500 MG Tabs tablet Commonly known as:  TYLENOL PM Take 1 tablet by mouth at bedtime as needed (sleep or pain).   FLUoxetine 20 MG capsule Commonly known as:  PROZAC Take 20 mg by mouth daily.   furosemide 20 MG tablet Commonly known as:  LASIX Take 20 mg by mouth daily.   gabapentin 300 MG capsule Commonly known as:  NEURONTIN Take 1 capsule (300MG ) by mouth every morning, 1 capsule (300MG ) every evening and 3 capsules (900MG ) at bedtime   GNP CAL MAG ZINC +D3 PO Take 1 tablet by mouth daily.   insulin aspart 100 UNIT/ML injection Commonly known as:  novoLOG Inject 0-9 Units into the skin 3 (three) times daily with meals. CBG 70 - 120: 0 units CBG 121 - 150: 1 unit CBG 151 - 200: 2 units CBG 201 - 250: 3 units CBG 251 - 300: 5 units CBG 301 - 350: 7 units CBG 351 - 400: 9 units CBG > 400: call MD   insulin glargine 100 UNIT/ML injection Commonly known as:  LANTUS Inject 0.25 mLs (25 Units total) into the skin daily. What changed:  how much to take   pravastatin 20 MG tablet Commonly known as:  PRAVACHOL Take 20 mg by mouth every evening.   predniSONE 5 MG tablet Commonly known as:  DELTASONE Take 7.5 mg by mouth daily.   vitamin B-12 1000 MCG tablet Commonly known as:  CYANOCOBALAMIN Take 1,000 mcg by mouth daily.   Vitamin D (Ergocalciferol) 1.25 MG (50000 UT) Caps capsule Commonly known as:  DRISDOL Take 50,000 Units by mouth every Monday.          Management plans discussed with the patient and she is in agreement. Stable for discharge   Patient should follow up with pcp  CODE STATUS:     Code Status Orders  (From admission, onward)  Start     Ordered   10/20/18 0304  Full code  Continuous     10/20/18 0303        Code Status History    Date Active Date Inactive Code Status Order ID Comments User Context   08/14/2018 2245 08/16/2018 1829 Partial Code [id number]  Sela Hua, MD  ED   06/26/2018 1128 06/29/2018 2059 Full Code [id number]  Gladstone Lighter, MD Inpatient   04/30/2018 1446 05/03/2018 1507 Full Code [id number]  Saundra Shelling, MD Inpatient   03/15/2018 2153 03/17/2018 1803 Full Code [id number]  Lance Coon, MD Inpatient   12/17/2016 0125 12/19/2016 1856 Full Code [id number]  Theodoro Grist, MD Inpatient   08/11/2016 0324 08/11/2016 0937 Full Code [id number]  Lance Coon, MD Inpatient   03/29/2015 2115 03/30/2015 1555 Full Code [id number]  Nicholes Mango, MD Inpatient    Advance Directive Documentation     Most Recent Value  Type of Advance Directive  Healthcare Power of Attorney  Pre-existing out of facility DNR order (yellow form or pink MOST form)  -  "MOST" Form in Place?  -      TOTAL TIME TAKING CARE OF THIS PATIENT: 38 minutes.    Note: This dictation was prepared with Dragon dictation along with smaller phrase technology. Any transcriptional errors that result from this process are unintentional.  Ladamien Rammel M.D on 10/21/2018 at 12:07 PM  Between 7am to 6pm - Pager - [phone] After 6pm go to [email or web address] - password [email or web address] [facility]  Office  [phone]  CC: Primary care physician; [name], MD

## 2018-10-21 NOTE — Progress Notes (Signed)
Patient is medically stable for D/C to [facility] today. Per [facility] admissions coordinator at [facility] authorization has been received and patient can come today to room D-14. RN will call report and arrange EMS for transport. Clinical Education officer, museum (CSW) sent D/C orders to [facility] via [employer]. Patient is aware of above. CSW contacted patient's daughter [name] and made her aware of above. Please reconsult if future social work needs arise. CSW signing off.   [employer], LCSW [phone]

## 2018-10-21 NOTE — Clinical Social Work Note (Signed)
Clinical Social Work Assessment  Patient Details  [name] MRN: [id number] Date of Birth: 15-Dec-1928  Date of referral:  10/21/18               Reason for consult:  Facility Placement                Permission sought to share information with:  Case Manager, Customer service manager, Family Supports Permission granted to share information::  Yes, Verbal Permission Granted  Name::      SNF  Agency::   [facility]   Relationship::     Contact Information:     Housing/Transportation Living arrangements for the past 2 months:  [place] of Information:  Adult Children Patient Interpreter Needed:  None Criminal Activity/Legal Involvement Pertinent to Current Situation/Hospitalization:  No - Comment as needed Significant Relationships:  Adult Children Lives with:  Adult Children Do you feel safe going back to the place where you live?  Yes Need for family participation in patient care:  Yes (Comment)  Care giving concerns: Patient lives with daughter    Social Worker assessment / plan:  CSW consulted for SNF placement. CSW spoke with daughter Tracy Mcmahon [phone]. Daughter states that she is currently out of town and can not take patient home. Per daughter she has talked with [facility] and they will take patient for rehab. CSW contacted Rick at [facility] and he states that he can not accept patient until authorization is received. CSW contacted patient's daughter and explained this to her. Daughter states that she will pay privately for patient to go to SNF. CSW will follow for discharge planning.   Employment status:  Retired Nurse, adult PT Recommendations:  [place] / Referral to community resources:  [place]  Patient/Family's Response to care:  Daughter thanked CSW for assistance   Patient/Family's Understanding of and Emotional Response to Diagnosis, Current  Treatment, and Prognosis:  Daughter understands current treatment   Emotional Assessment Appearance:  Appears stated age Attitude/Demeanor/Rapport:    Affect (typically observed):  Accepting, Pleasant Orientation:  Oriented to Self, Oriented to Place Alcohol / Substance use:  Not Applicable Psych involvement (Current and /or in the community):  No (Comment)  Discharge Needs  Concerns to be addressed:  Discharge Planning Concerns Readmission within the last 30 days:  No Current discharge risk:  Cognitively Impaired, Dependent with Mobility Barriers to Discharge:  Continued Medical Work up   [employer], [place] 10/21/2018, 1:58 PM

## 2018-10-21 NOTE — Care Management Obs Status (Signed)
[place] NOTIFICATION   Patient Details  [name] MRN: [id number] Date of Birth: 15-Sep-1929   Medicare Observation Status Notification Given:  Yes    Beverly Sessions, RN 10/21/2018, 3:51 PM

## 2018-10-21 NOTE — NC FL2 (Signed)
[place] LEVEL OF CARE SCREENING TOOL     IDENTIFICATION  Patient [name] Birthdate: 1929-10-09 Sex: female Admission Date (Current Location): 10/19/2018  [place] and [place] Number:  Engineering geologist and Address:  [facility], [address] [place], [place] [address]      Provider Number: [id number]  Attending Physician Name and Address:  Bettey Costa, MD  Relative Name and Phone Number:  [name]- daughter [phone]    Current Level of Care: Hospital Recommended Level of Care: [place] Prior Approval Number:    Date Approved/Denied:   PASRR Number: [id number]  Discharge Plan: SNF    Current Diagnoses: Patient Active Problem List   Diagnosis Date Noted  . Recurrent UTI 10/20/2018  . Pressure injury of skin 08/15/2018  . Weakness 06/26/2018  . Hypoglycemia 04/30/2018  . UTI (urinary tract infection) 03/15/2018  . Acute encephalopathy 12/19/2016  . Generalized weakness 12/19/2016  . Acute on chronic respiratory failure with hypoxia and hypercapnia ([place]) 12/16/2016  . COPD exacerbation ([place]) 12/16/2016  . Community acquired pneumonia 12/16/2016  . Lactic acidosis 12/16/2016  . Sepsis ([place]) 12/16/2016  . PE (pulmonary embolism) 08/11/2016  . COPD (chronic obstructive pulmonary disease) ([place]) 03/29/2015  . Hypertension 03/29/2015  . Diabetes ([place]) 03/29/2015  . Hyperlipidemia 03/29/2015  . Depression 03/29/2015  . Restless leg syndrome 03/29/2015  . DVT (deep venous thrombosis) ([place]) 03/29/2015  . CKD (chronic kidney disease), stage III ([place]) 03/29/2015    Orientation RESPIRATION BLADDER Height & Weight     Self, Place  Normal Incontinent Weight: 200 lb (90.7 kg) Height:  5' (152.4 cm)  BEHAVIORAL SYMPTOMS/MOOD NEUROLOGICAL BOWEL NUTRITION STATUS  (none) (none) Continent Diet(Heart healthy )  AMBULATORY STATUS COMMUNICATION OF NEEDS Skin   Extensive Assist Verbally Normal                        Personal Care Assistance Level of Assistance  Bathing, Feeding, Dressing Bathing Assistance: Limited assistance Feeding assistance: Independent Dressing Assistance: Limited assistance     Functional Limitations Info  Sight, Hearing, Speech Sight Info: Adequate Hearing Info: Adequate Speech Info: Adequate    SPECIAL CARE FACTORS FREQUENCY  PT (By licensed PT), OT (By licensed OT)     PT Frequency: 5 OT Frequency: 5            Contractures Contractures Info: Not present    Additional Factors Info  Code Status, Allergies Code Status Info: Full Code  Allergies Info: Fosamax Alendronate Sodium, Wellbutrin Bupropion           Current Medications (10/21/2018):  This is the current hospital active medication list Current Facility-Administered Medications  Medication Dose Route Frequency Provider Last Rate Last Dose  . acetaminophen (TYLENOL) tablet 650 mg  650 mg Oral Q6H PRN Arta Silence, MD       Or  . acetaminophen (TYLENOL) suppository 650 mg  650 mg Rectal Q6H PRN Arta Silence, MD      . albuterol (PROVENTIL) (2.5 MG/3ML) 0.083% nebulizer solution 2.5 mg  2.5 mg Nebulization Q4H PRN Arta Silence, MD      . amLODipine (NORVASC) tablet 5 mg  5 mg Oral Daily Arta Silence, MD   5 mg at 10/21/18 1029  . apixaban (ELIQUIS) tablet 5 mg  5 mg Oral BID Arta Silence, MD   5 mg at 10/21/18 1030  . aspirin EC tablet 81 mg  81 mg Oral Daily [place], [place],  MD   81 mg at 10/21/18 1029  . atenolol (TENORMIN) tablet 25 mg  25 mg Oral BID Arta Silence, MD   25 mg at 10/21/18 1030  . bisacodyl (DULCOLAX) EC tablet 5 mg  5 mg Oral Daily PRN Arta Silence, MD      . calcium-vitamin D (OSCAL WITH D) 500-200 MG-UNIT per tablet 1 tablet  1 tablet Oral Daily Arta Silence, MD   1 tablet at 10/21/18 1029  . cephALEXin (KEFLEX) capsule 250 mg  250 mg Oral Q12H Mody, Sital, MD   250 mg at 10/21/18 1030  .  FLUoxetine (PROZAC) capsule 20 mg  20 mg Oral Daily Arta Silence, MD   20 mg at 10/21/18 1030  . furosemide (LASIX) tablet 20 mg  20 mg Oral Daily Arta Silence, MD   20 mg at 10/21/18 1030  . gabapentin (NEURONTIN) capsule 300 mg  300 mg Oral q morning - 10a Arta Silence, MD   300 mg at 10/21/18 1029  . gabapentin (NEURONTIN) capsule 300 mg  300 mg Oral QPM Epifanio Lesches, MD   300 mg at 10/20/18 1800  . insulin aspart (novoLOG) injection 0-5 Units  0-5 Units Subcutaneous QHS Sridharan, Prasanna, MD      . insulin aspart (novoLOG) injection 0-9 Units  0-9 Units Subcutaneous TID WC Arta Silence, MD   1 Units at 10/21/18 0823  . ondansetron (ZOFRAN) tablet 4 mg  4 mg Oral Q6H PRN Arta Silence, MD       Or  . ondansetron (ZOFRAN) injection 4 mg  4 mg Intravenous Q6H PRN Arta Silence, MD      . oxyCODONE (Oxy IR/ROXICODONE) immediate release tablet 5-10 mg  5-10 mg Oral Q4H PRN Jodell Cipro, Prasanna, MD      . polyethylene glycol (MIRALAX / GLYCOLAX) packet 17 g  17 g Oral Daily Arta Silence, MD   17 g at 10/21/18 1030  . potassium chloride (KLOR-CON) packet 20 mEq  20 mEq Oral Daily Arta Silence, MD   20 mEq at 10/21/18 1029  . pravastatin (PRAVACHOL) tablet 20 mg  20 mg Oral QPM Arta Silence, MD   20 mg at 10/20/18 1757  . predniSONE (DELTASONE) tablet 7.5 mg  7.5 mg Oral Daily Arta Silence, MD   7.5 mg at 10/21/18 1033  . senna-docusate (Senokot-S) tablet 1 tablet  1 tablet Oral QHS PRN Arta Silence, MD      . tiotropium (SPIRIVA) inhalation capsule ([facility] use ONLY) 18 mcg  18 mcg Inhalation Daily Arta Silence, MD   18 mcg at 10/21/18 1032  . vitamin B-12 (CYANOCOBALAMIN) tablet 1,000 mcg  1,000 mcg Oral Daily Arta Silence, MD   1,000 mcg at 10/21/18 1030     Discharge Medications: Please see discharge summary for a list of discharge medications.  Relevant Imaging Results:  Relevant Lab  Results:   Additional Information SSn: [id number]  Annamaria Boots, [place]

## 2018-10-21 NOTE — Clinical Social Work Placement (Signed)
   CLINICAL SOCIAL WORK PLACEMENT  NOTE  Date:  10/21/2018  Patient Details  [name] MRN: [id number] Date of Birth: 1928/12/08  Clinical Social Work is seeking post-discharge placement for this patient at the [place] level of care (*CSW will initial, date and re-position this form in  chart as items are completed):  Yes   Patient/family provided with [place] Work Department's list of facilities offering this level of care within the geographic area requested by the patient (or if unable, by the patient's family).  Yes   Patient/family informed of their freedom to choose among providers that offer the needed level of care, that participate in Medicare, Medicaid or managed care program needed by the patient, have an available bed and are willing to accept the patient.  Yes   Patient/family informed of [facility]'s ownership interest in [facility] and [facility], as well as of the fact that they are under no obligation to receive care at these facilities.  PASRR submitted to EDS on       PASRR number received on       Existing PASRR number confirmed on 10/21/18     FL2 transmitted to all facilities in geographic area requested by pt/family on 10/21/18     FL2 transmitted to all facilities within larger geographic area on       Patient informed that his/her managed care company has contracts with or will negotiate with certain facilities, including the following:        Yes   Patient/family informed of bed offers received.  Patient chooses bed at [facility] )     Physician recommends and patient chooses bed at      Patient to be [facility] to [facility] ) on 10/21/18.  Patient to be transferred to facility by [facility] )     Patient family notified on 10/21/18 of transfer.  Name of family member notified:  (Patient's daughter [name] is aware of D/C today. )     PHYSICIAN       Additional Comment:     _______________________________________________ Naya Ilagan, Veronia Beets, LCSW 10/21/2018, 5:33 PM

## 2018-10-21 NOTE — Clinical Social Work Note (Signed)
Patient is medically ready for discharge today. CSW spoke with daughter [name] [phone]. [name] states that [place] at [place] told her patient needs to stay another night. CSW explained that [facility] is not able to make that decision and patient is medically stable for discharge today. CSW explained that [employer] [facility] has offered to take patient today if family can pay $1320 today to cover 5 days while waiting for [place]. Daughter states that she can not do that. Daughter states that she will call [facility] again to see if they will consider taking patient today. [name] states that she will call CSW back after speaking with [facility]. CSW also spoke with patient's 2 other daughters Herma Mering and Smith Mince at bedside. Both state that they are not "allowed" to do anything with their mother because they do not have POA. CSW awaiting phone call from [place].   [place], [place]

## 2018-10-21 NOTE — Care Management (Signed)
Daughter [name] who is POA is currently out of town in [place].  Patient lives at home with daughter.  Plan is for [facility] at discharge.  Observation letter reviewed with [name] over the phone.  Printed copy left in room.

## 2018-10-23 ENCOUNTER — Other Ambulatory Visit: Payer: Self-pay | Admitting: *Deleted

## 2018-10-23 LAB — URINE CULTURE

## 2018-10-23 NOTE — Patient Outreach (Signed)
[facility]) Care Management  10/23/2018  Tracy Mcmahon 1928/12/26 [id number]   Care Coordination   Patient active with [facility] care management, community care coordinator , noted in patient admission to [facility] on 11/26 and discharged on 11/27 to [facility] SNF.  Plan Will close nursing program for now , will notify LCSW of discharge to SNF.     [name], RN, [place] Management Coordinator  [phone]- Mobile [phone]- Toll Free Main Office

## 2018-10-27 ENCOUNTER — Other Ambulatory Visit: Payer: Self-pay | Admitting: *Deleted

## 2018-10-27 ENCOUNTER — Encounter: Payer: Self-pay | Admitting: *Deleted

## 2018-10-27 ENCOUNTER — Ambulatory Visit
Admission: RE | Admit: 2018-10-27 | Discharge: 2018-10-27 | Disposition: A | Discharge status: Home / Self Care | Payer: Medicare HMO | Source: Ambulatory Visit | Attending: Urology | Admitting: Urology

## 2018-10-27 DIAGNOSIS — Z905 Acquired absence of kidney: Secondary | ICD-10-CM | POA: Insufficient documentation

## 2018-10-27 DIAGNOSIS — E1122 Type 2 diabetes mellitus with diabetic chronic kidney disease: Secondary | ICD-10-CM | POA: Diagnosis not present

## 2018-10-27 DIAGNOSIS — I82409 Acute embolism and thrombosis of unspecified deep veins of unspecified lower extremity: Secondary | ICD-10-CM | POA: Diagnosis not present

## 2018-10-27 DIAGNOSIS — N390 Urinary tract infection, site not specified: Secondary | ICD-10-CM | POA: Diagnosis not present

## 2018-10-27 DIAGNOSIS — N281 Cyst of kidney, acquired: Secondary | ICD-10-CM | POA: Diagnosis not present

## 2018-10-27 DIAGNOSIS — J811 Chronic pulmonary edema: Secondary | ICD-10-CM | POA: Diagnosis not present

## 2018-10-27 DIAGNOSIS — N183 Chronic kidney disease, stage 3 (moderate): Secondary | ICD-10-CM | POA: Diagnosis not present

## 2018-10-27 NOTE — Patient Outreach (Signed)
[name] Care Management  10/27/2018  Tracy Mcmahon 05-20-1929 [id number]   Post Acute Care Coordniation  Phone call to the discharge planner at [facility] to discuss patient's progress in rehab. This social worer spoke with the discharge planner Beth who states that she had limited knowledge regarding patient's progress at this time , however knows that patient will be discharging home with her daughter.  Patient out of the facility at a doctor's appointment at this time.   Plan: This social worker will follow up with the discharge planner regarding patient's progress and discharge plan within 2 weeks.   Tracy Mcmahon [facility] Care Management [phone]

## 2018-10-27 NOTE — Telephone Encounter (Signed)
This encounter was created in error - please disregard.

## 2018-10-27 NOTE — Patient Outreach (Signed)
[facility]) Care Management  10/27/2018  Tracy Mcmahon 1929/10/14 [id number]   Phone call to the [facility] to check patient's progress in rehab and to discuss any foreseeable barriers to discharge. Message left for a return call.  Plan: This Education officer, museum will await return call form the discharge planner.   Sheralyn Boatman [facility] Care Management [phone]

## 2018-10-28 ENCOUNTER — Telehealth: Payer: Self-pay

## 2018-10-28 NOTE — Telephone Encounter (Signed)
I have attempted to reach pt on her phone number listed, someone answered and said she was currently in a nursing facility. I then tried two other numbers on her chart and her daughter said she was in [place] home. Due to no DPR on file I could not release information to her daughter. I did try and call [facility], was on hold for 5 minutes, was transferred to pt, someone picked up said hello, then disconnected the line. Was unable to reach anyone on callback.

## 2018-10-28 NOTE — Telephone Encounter (Signed)
-----   Message from Hollice Espy, MD sent at 10/28/2018  2:23 PM EST ----- Your renal ultrasound looks fine.  There are no stones or any other significant pathology.  Hollice Espy, MD

## 2018-10-29 ENCOUNTER — Ambulatory Visit: Payer: Self-pay | Admitting: *Deleted

## 2018-10-30 ENCOUNTER — Other Ambulatory Visit: Payer: Self-pay | Admitting: Family Medicine

## 2018-11-03 ENCOUNTER — Ambulatory Visit: Payer: Self-pay | Admitting: *Deleted

## 2018-11-03 ENCOUNTER — Other Ambulatory Visit: Payer: Self-pay | Admitting: *Deleted

## 2018-11-03 NOTE — Patient Outreach (Addendum)
[facility]) Care Management  11/03/2018  SOLEIL MAS 1929-07-23 [id number]   Post Acute Care Coordination  Phone call to the discharge planner at the [facility] to follow up on patient's progress in therapy. Per Beth-discharge planner there is no discharge date set for patient, however she will return home [facility]. Per Eustaquio Maize, patient has a supportive family who visit daily. She sees no barriers to discharge when ready at this time.  Plan: This Education officer, museum will continue to follow.   Sheralyn Boatman [facility] Care Management [phone]

## 2018-11-10 DIAGNOSIS — Z79899 Other long term (current) drug therapy: Secondary | ICD-10-CM | POA: Diagnosis not present

## 2018-11-10 DIAGNOSIS — Z794 Long term (current) use of insulin: Secondary | ICD-10-CM | POA: Diagnosis not present

## 2018-11-10 DIAGNOSIS — J439 Emphysema, unspecified: Secondary | ICD-10-CM | POA: Diagnosis not present

## 2018-11-10 DIAGNOSIS — E1159 Type 2 diabetes mellitus with other circulatory complications: Secondary | ICD-10-CM | POA: Diagnosis not present

## 2018-11-10 DIAGNOSIS — N183 Chronic kidney disease, stage 3 (moderate): Secondary | ICD-10-CM | POA: Diagnosis not present

## 2018-11-10 DIAGNOSIS — J9611 Chronic respiratory failure with hypoxia: Secondary | ICD-10-CM | POA: Diagnosis not present

## 2018-11-10 DIAGNOSIS — M353 Polymyalgia rheumatica: Secondary | ICD-10-CM | POA: Diagnosis not present

## 2018-11-10 DIAGNOSIS — E1122 Type 2 diabetes mellitus with diabetic chronic kidney disease: Secondary | ICD-10-CM | POA: Diagnosis not present

## 2018-11-10 DIAGNOSIS — E1169 Type 2 diabetes mellitus with other specified complication: Secondary | ICD-10-CM | POA: Diagnosis not present

## 2018-11-10 DIAGNOSIS — E559 Vitamin D deficiency, unspecified: Secondary | ICD-10-CM | POA: Diagnosis not present

## 2018-11-11 ENCOUNTER — Other Ambulatory Visit: Payer: Self-pay | Admitting: *Deleted

## 2018-11-11 NOTE — Patient Outreach (Signed)
[facility]) Care Management  11/11/2018  SHIANNE ZEISER 17-Jul-1929 [id number]   Transition of Care Referral   Referral Date: 11/10/18  Referral Source:  [facility] inpatient referral  Date of Admission: 10/21/18 to [facility]  Diagnosis: confusion, weakness, pain, acute recurrent cystitis Date of Discharge: on 11/07/18 . Facility: [facility]    This pt is being followed by [facility] SW and prior to admission to hospital and facility was followed by [employer] RN CM.  Outreach attempt # 1 No answer. [facility] RN CM left HIPAA compliant voicemail message along with CM's contact info.   Plan: [facility] RN CM sent an unsuccessful outreach letter and scheduled this patient for another call attempt within 4 business days  [place] L. Lavina Hamman, RN, BSN, [place] Management Care Coordinator Direct Number [phone] Mobile number [phone]  Main [facility] number [phone] Fax number [phone]

## 2018-11-12 ENCOUNTER — Ambulatory Visit: Payer: Self-pay | Admitting: *Deleted

## 2018-11-16 ENCOUNTER — Ambulatory Visit: Payer: Self-pay | Admitting: *Deleted

## 2018-11-19 ENCOUNTER — Ambulatory Visit: Payer: Self-pay | Admitting: *Deleted

## 2018-11-20 ENCOUNTER — Other Ambulatory Visit: Payer: Self-pay | Admitting: *Deleted

## 2018-11-20 NOTE — Patient Outreach (Addendum)
[facility]) Care Management  11/20/2018  ARRIANNA CATALA 08-25-1929 [id number]   Transition of Care Referral  Referral Date: 11/10/18  Referral Source:  [facility] inpatient referral  Date of Admission: 10/21/18 to [facility]  Diagnosis: confusion, weakness, pain, acute recurrent cystitis Date of Discharge: on 11/07/18 . Facility: [facility]    This pt is being followed by [facility] SW and prior to admission to hospital and facility was followed by [employer] RN CM.  Transition of care by PCP office - [facility]   Outreach attempt # 2 No answer. [facility] RN CM left HIPAA compliant voicemail message along with CM's contact info.   Cm has called the number in Epic listed as her home/mobile number and the same number listed for her daughter, carol's number Cm left a HIPAA compliant voice message at the number listed for Smith Mince daughter   Plan: [facility] RN CM  scheduled this patient for another call attempt within 4 business days Telephonic [facility] RN CM consulted with [facility] SW and updated [place] RN CM, K Clinical biochemist via Epic in [employer] L. Lavina Hamman, RN, BSN, [place] Management Care Coordinator Direct Number [phone] Mobile number [phone]  Main [facility] number [phone] Fax number [phone]

## 2018-11-23 ENCOUNTER — Other Ambulatory Visit: Payer: Self-pay | Admitting: *Deleted

## 2018-11-23 NOTE — Patient Outreach (Signed)
[facility]) Care Management  11/23/2018  MARYROSE COLVIN [birth year] [id number]   Transition of Care Referral  Referral Date:11/10/18 Referral Source:[facility] inpatient referral  Date of Admission:10/21/18 [facility] Diagnosis:confusion, weakness, pain, acute recurrent cystitis Date of Discharge:on 11/07/18 . [place]   This pt is being followed by [facility] SW and prior to admission to hospital and facility was followed by [facility] RN CM.  Transition of care by PCP office - [facility]   Outreach attempt # 3 No answer. [facility] RN CM left HIPAA compliant voicemail message along with CM's contact info.     Plan: [facility] RN CM  scheduled this patient for case closure within 4 business days   [facility] SW [facility] [place] [facility] RN CM, Doreene Adas were updated on last week   [employer] Lavina Hamman, RN, BSN, [place] Management Care Coordinator Direct Number [phone] Mobile number [phone]  Main [facility] number [phone] Fax number [phone]

## 2018-11-26 ENCOUNTER — Other Ambulatory Visit: Payer: Self-pay | Admitting: *Deleted

## 2018-11-26 NOTE — Patient Outreach (Signed)
[facility]) Care Management  11/26/2018  ELISE KNOBLOCH 1929-03-18 [id number]   Case closure   Call attempts made on 12/181/19, 11/20/18 and 11/23/18 Unsuccessful outreach letter sent on 11/11/18 without a response   Plan [facility] RN CM will close case after no response from patient within 10 business days. Unable to reach   [place]osalie. Lavina Hamman, RN, BSN, [place] Coordinator Office number [phone] Mobile number [phone]  Main [facility] number [phone] Fax number [phone]

## 2018-11-30 ENCOUNTER — Other Ambulatory Visit: Payer: Self-pay | Admitting: *Deleted

## 2018-11-30 NOTE — Patient Outreach (Signed)
[facility]) Care Management  11/30/2018  NEVEA SPIEWAK [birth year] [id number]   Phone call to patient to [place] work needs post discharge from the SNF. Message left for a return call.  Unable to contact letter previously sent by [facility].  This social worker to call patient within 3-4 business days.   Sheralyn Boatman [facility] Care Management [phone]

## 2018-12-02 ENCOUNTER — Other Ambulatory Visit: Payer: Self-pay | Admitting: *Deleted

## 2018-12-02 NOTE — Patient Outreach (Signed)
[facility]) Care Management  12/02/2018  DANYALE RIDINGER 25-Jun-1929 [id number]  Second attempt to reach patient Phone call to patient to assess for social work needs post discharge from the SNF. Message left for a return call.  Unable to contact letter previously sent by [facility].  This social worker to call patient within 3-4 business days.    Sheralyn Boatman [facility] Care Management [phone]

## 2018-12-07 ENCOUNTER — Other Ambulatory Visit: Payer: Self-pay | Admitting: *Deleted

## 2018-12-07 NOTE — Patient Outreach (Signed)
[facility]) Care Management  12/07/2018  Tracy Mcmahon 02/16/1929 [id number]   Third outreach call to patient    Phone call to patient to assess for social worn needs post discharge from the SNF. Message left for a return call.  This Education officer, museum will await patient's return call. Will close case if a return call is not made by 12/11/2018.   Sheralyn Boatman [facility] Care Management [phone]

## 2018-12-15 ENCOUNTER — Other Ambulatory Visit: Payer: Self-pay | Admitting: *Deleted

## 2018-12-15 NOTE — Patient Outreach (Signed)
[facility]) Care Management  12/15/2018  HARJOT ZAVADIL [birth year] [id number]   Case Closure  Call attempts made on 11/30/18, 12/02/18 and 12/07/18 without response.    Plan: Patient to be closed due to no response received.   Sheralyn Boatman [facility] Care Management [phone]

## 2019-01-05 DIAGNOSIS — Z794 Long term (current) use of insulin: Secondary | ICD-10-CM | POA: Diagnosis not present

## 2019-01-05 DIAGNOSIS — E1169 Type 2 diabetes mellitus with other specified complication: Secondary | ICD-10-CM | POA: Diagnosis not present

## 2019-01-05 DIAGNOSIS — E1159 Type 2 diabetes mellitus with other circulatory complications: Secondary | ICD-10-CM | POA: Diagnosis not present

## 2019-01-05 DIAGNOSIS — Z9189 Other specified personal risk factors, not elsewhere classified: Secondary | ICD-10-CM | POA: Diagnosis not present

## 2019-01-05 DIAGNOSIS — N183 Chronic kidney disease, stage 3 (moderate): Secondary | ICD-10-CM | POA: Diagnosis not present

## 2019-01-05 DIAGNOSIS — E1122 Type 2 diabetes mellitus with diabetic chronic kidney disease: Secondary | ICD-10-CM | POA: Diagnosis not present

## 2019-01-05 DIAGNOSIS — F321 Major depressive disorder, single episode, moderate: Secondary | ICD-10-CM | POA: Diagnosis not present

## 2019-01-05 DIAGNOSIS — I10 Essential (primary) hypertension: Secondary | ICD-10-CM | POA: Diagnosis not present

## 2019-01-05 DIAGNOSIS — F0151 Vascular dementia with behavioral disturbance: Secondary | ICD-10-CM | POA: Diagnosis not present

## 2019-01-05 DIAGNOSIS — E782 Mixed hyperlipidemia: Secondary | ICD-10-CM | POA: Diagnosis not present

## 2019-01-11 DIAGNOSIS — N184 Chronic kidney disease, stage 4 (severe): Secondary | ICD-10-CM | POA: Diagnosis not present

## 2019-01-11 DIAGNOSIS — I10 Essential (primary) hypertension: Secondary | ICD-10-CM | POA: Diagnosis not present

## 2019-01-11 DIAGNOSIS — D631 Anemia in chronic kidney disease: Secondary | ICD-10-CM | POA: Diagnosis not present

## 2019-01-11 DIAGNOSIS — N2581 Secondary hyperparathyroidism of renal origin: Secondary | ICD-10-CM | POA: Diagnosis not present

## 2019-01-14 DIAGNOSIS — D631 Anemia in chronic kidney disease: Secondary | ICD-10-CM | POA: Diagnosis not present

## 2019-01-14 DIAGNOSIS — N184 Chronic kidney disease, stage 4 (severe): Secondary | ICD-10-CM | POA: Diagnosis not present

## 2019-01-14 DIAGNOSIS — N2581 Secondary hyperparathyroidism of renal origin: Secondary | ICD-10-CM | POA: Diagnosis not present

## 2019-01-14 DIAGNOSIS — I10 Essential (primary) hypertension: Secondary | ICD-10-CM | POA: Diagnosis not present

## 2019-02-12 DIAGNOSIS — M6080 Other myositis, unspecified site: Secondary | ICD-10-CM | POA: Diagnosis not present

## 2019-02-12 DIAGNOSIS — I10 Essential (primary) hypertension: Secondary | ICD-10-CM | POA: Diagnosis not present

## 2019-02-12 DIAGNOSIS — R0902 Hypoxemia: Secondary | ICD-10-CM | POA: Diagnosis not present

## 2019-03-04 DIAGNOSIS — N183 Chronic kidney disease, stage 3 (moderate): Secondary | ICD-10-CM | POA: Diagnosis not present

## 2019-03-04 DIAGNOSIS — F0151 Vascular dementia with behavioral disturbance: Secondary | ICD-10-CM | POA: Diagnosis not present

## 2019-03-04 DIAGNOSIS — J439 Emphysema, unspecified: Secondary | ICD-10-CM | POA: Diagnosis not present

## 2019-03-04 DIAGNOSIS — E1159 Type 2 diabetes mellitus with other circulatory complications: Secondary | ICD-10-CM | POA: Diagnosis not present

## 2019-03-04 DIAGNOSIS — E1142 Type 2 diabetes mellitus with diabetic polyneuropathy: Secondary | ICD-10-CM | POA: Diagnosis not present

## 2019-03-04 DIAGNOSIS — G629 Polyneuropathy, unspecified: Secondary | ICD-10-CM | POA: Diagnosis not present

## 2019-03-04 DIAGNOSIS — E1169 Type 2 diabetes mellitus with other specified complication: Secondary | ICD-10-CM | POA: Diagnosis not present

## 2019-03-04 DIAGNOSIS — J9611 Chronic respiratory failure with hypoxia: Secondary | ICD-10-CM | POA: Diagnosis not present

## 2019-03-04 DIAGNOSIS — E1122 Type 2 diabetes mellitus with diabetic chronic kidney disease: Secondary | ICD-10-CM | POA: Diagnosis not present

## 2019-03-15 DIAGNOSIS — I10 Essential (primary) hypertension: Secondary | ICD-10-CM | POA: Diagnosis not present

## 2019-03-15 DIAGNOSIS — M6080 Other myositis, unspecified site: Secondary | ICD-10-CM | POA: Diagnosis not present

## 2019-03-15 DIAGNOSIS — R0902 Hypoxemia: Secondary | ICD-10-CM | POA: Diagnosis not present

## 2019-04-14 DIAGNOSIS — I10 Essential (primary) hypertension: Secondary | ICD-10-CM | POA: Diagnosis not present

## 2019-04-14 DIAGNOSIS — R0902 Hypoxemia: Secondary | ICD-10-CM | POA: Diagnosis not present

## 2019-04-14 DIAGNOSIS — M6080 Other myositis, unspecified site: Secondary | ICD-10-CM | POA: Diagnosis not present

## 2019-04-18 ENCOUNTER — Other Ambulatory Visit: Payer: Self-pay

## 2019-04-18 ENCOUNTER — Emergency Department: Payer: Medicare HMO

## 2019-04-18 ENCOUNTER — Emergency Department
Admission: EM | Admit: 2019-04-18 | Discharge: 2019-04-18 | Disposition: A | Discharge status: Home / Self Care | Payer: Medicare HMO | Attending: Emergency Medicine | Admitting: Emergency Medicine

## 2019-04-18 DIAGNOSIS — Z7982 Long term (current) use of aspirin: Secondary | ICD-10-CM | POA: Diagnosis not present

## 2019-04-18 DIAGNOSIS — Z7901 Long term (current) use of anticoagulants: Secondary | ICD-10-CM | POA: Insufficient documentation

## 2019-04-18 DIAGNOSIS — Z794 Long term (current) use of insulin: Secondary | ICD-10-CM | POA: Insufficient documentation

## 2019-04-18 DIAGNOSIS — R001 Bradycardia, unspecified: Secondary | ICD-10-CM | POA: Diagnosis not present

## 2019-04-18 DIAGNOSIS — N183 Chronic kidney disease, stage 3 (moderate): Secondary | ICD-10-CM | POA: Diagnosis not present

## 2019-04-18 DIAGNOSIS — Z96643 Presence of artificial hip joint, bilateral: Secondary | ICD-10-CM | POA: Insufficient documentation

## 2019-04-18 DIAGNOSIS — R21 Rash and other nonspecific skin eruption: Secondary | ICD-10-CM | POA: Insufficient documentation

## 2019-04-18 DIAGNOSIS — Z79899 Other long term (current) drug therapy: Secondary | ICD-10-CM | POA: Insufficient documentation

## 2019-04-18 DIAGNOSIS — J449 Chronic obstructive pulmonary disease, unspecified: Secondary | ICD-10-CM | POA: Insufficient documentation

## 2019-04-18 DIAGNOSIS — Z1159 Encounter for screening for other viral diseases: Secondary | ICD-10-CM | POA: Diagnosis not present

## 2019-04-18 DIAGNOSIS — Z20828 Contact with and (suspected) exposure to other viral communicable diseases: Secondary | ICD-10-CM | POA: Diagnosis not present

## 2019-04-18 DIAGNOSIS — I129 Hypertensive chronic kidney disease with stage 1 through stage 4 chronic kidney disease, or unspecified chronic kidney disease: Secondary | ICD-10-CM | POA: Diagnosis not present

## 2019-04-18 DIAGNOSIS — E1122 Type 2 diabetes mellitus with diabetic chronic kidney disease: Secondary | ICD-10-CM | POA: Diagnosis not present

## 2019-04-18 DIAGNOSIS — R0602 Shortness of breath: Secondary | ICD-10-CM

## 2019-04-18 LAB — CBC WITH DIFFERENTIAL/PLATELET
Abs Immature Granulocytes: 0.1 10*3/uL — ABNORMAL HIGH (ref 0.00–0.07)
Basophils Absolute: 0 10*3/uL (ref 0.0–0.1)
Basophils Relative: 0 %
Eosinophils Absolute: 1 10*3/uL — ABNORMAL HIGH (ref 0.0–0.5)
Eosinophils Relative: 7 %
HCT: 38.9 % (ref 36.0–46.0)
Hemoglobin: 11.8 g/dL — ABNORMAL LOW (ref 12.0–15.0)
Immature Granulocytes: 1 %
Lymphocytes Relative: 12 %
Lymphs Abs: 1.7 10*3/uL (ref 0.7–4.0)
MCH: 26.9 pg (ref 26.0–34.0)
MCHC: 30.3 g/dL (ref 30.0–36.0)
MCV: 88.8 fL (ref 80.0–100.0)
Monocytes Absolute: 0.7 10*3/uL (ref 0.1–1.0)
Monocytes Relative: 5 %
Neutro Abs: 10.5 10*3/uL — ABNORMAL HIGH (ref 1.7–7.7)
Neutrophils Relative %: 75 %
Platelets: 300 10*3/uL (ref 150–400)
RBC: 4.38 MIL/uL (ref 3.87–5.11)
RDW: 18.2 % — ABNORMAL HIGH (ref 11.5–15.5)
WBC: 14 10*3/uL — ABNORMAL HIGH (ref 4.0–10.5)
nRBC: 0 % (ref 0.0–0.2)

## 2019-04-18 LAB — URINALYSIS, COMPLETE (UACMP) WITH MICROSCOPIC
Bilirubin Urine: NEGATIVE
Glucose, UA: NEGATIVE mg/dL
Hgb urine dipstick: NEGATIVE
Ketones, ur: NEGATIVE mg/dL
Leukocytes,Ua: NEGATIVE
Nitrite: NEGATIVE
Protein, ur: NEGATIVE mg/dL
Specific Gravity, Urine: 1.004 — ABNORMAL LOW (ref 1.005–1.030)
pH: 5 (ref 5.0–8.0)

## 2019-04-18 LAB — COMPREHENSIVE METABOLIC PANEL
ALT: 10 U/L (ref 0–44)
AST: 14 U/L — ABNORMAL LOW (ref 15–41)
Albumin: 3.2 g/dL — ABNORMAL LOW (ref 3.5–5.0)
Alkaline Phosphatase: 57 U/L (ref 38–126)
Anion gap: 8 (ref 5–15)
BUN: 27 mg/dL — ABNORMAL HIGH (ref 8–23)
CO2: 29 mmol/L (ref 22–32)
Calcium: 9 mg/dL (ref 8.9–10.3)
Chloride: 102 mmol/L (ref 98–111)
Creatinine, Ser: 1.41 mg/dL — ABNORMAL HIGH (ref 0.44–1.00)
GFR calc Af Amer: 39 mL/min — ABNORMAL LOW (ref >60)
GFR calc non Af Amer: 33 mL/min — ABNORMAL LOW (ref >60)
Glucose, Bld: 138 mg/dL — ABNORMAL HIGH (ref 70–99)
Potassium: 4.5 mmol/L (ref 3.5–5.1)
Sodium: 139 mmol/L (ref 135–145)
Total Bilirubin: 0.4 mg/dL (ref 0.3–1.2)
Total Protein: 6.4 g/dL — ABNORMAL LOW (ref 6.5–8.1)

## 2019-04-18 LAB — SARS CORONAVIRUS 2 BY RT PCR (HOSPITAL ORDER, PERFORMED IN ~~LOC~~ HOSPITAL LAB): SARS Coronavirus 2: NEGATIVE

## 2019-04-18 LAB — TROPONIN I: Troponin I: <0.03 ng/mL (ref <0.03)

## 2019-04-18 MED ORDER — DIPHENHYDRAMINE HCL 50 MG/ML IJ SOLN
12.5000 mg | Freq: Once | INTRAMUSCULAR | Status: AC
  Administered 2019-04-18: 12.5 mg via INTRAVENOUS
  Filled 2019-04-18: qty 1

## 2019-04-18 MED ORDER — CEPHALEXIN 500 MG PO CAPS: 500.0000 mg | ORAL_CAPSULE | Freq: Three times a day (TID) | ORAL | 0 refills | Status: AC

## 2019-04-18 MED ORDER — FAMOTIDINE IN NACL 20-0.9 MG/50ML-% IV SOLN
20.0000 mg | Freq: Once | INTRAVENOUS | Status: AC
  Administered 2019-04-18: 20 mg via INTRAVENOUS
  Filled 2019-04-18: qty 50

## 2019-04-18 MED ORDER — DOXYCYCLINE HYCLATE 100 MG PO CAPS: 100.0000 mg | ORAL_CAPSULE | Freq: Two times a day (BID) | ORAL | 0 refills | Status: AC

## 2019-04-18 MED ORDER — DIPHENHYDRAMINE-ZINC ACETATE 2-0.1 % EX CREA: 1.0000 "application " | TOPICAL_CREAM | Freq: Three times a day (TID) | CUTANEOUS | 0 refills | Status: DC | PRN

## 2019-04-18 MED ORDER — METHYLPREDNISOLONE SODIUM SUCC 125 MG IJ SOLR
80.0000 mg | Freq: Once | INTRAMUSCULAR | Status: AC
  Administered 2019-04-18: 13:00:00 80 mg via INTRAVENOUS
  Filled 2019-04-18: qty 2

## 2019-04-18 NOTE — ED Provider Notes (Signed)
[facility] Emergency Department Provider Note  ____________________________________________  Time seen: Approximately 12:22 PM  I have reviewed the triage vital signs and the nursing notes.   HISTORY  Chief Complaint Rash   HPI CAPRI VEALS is a 85 y.o. female who presents to the emergency department for treatment of skin rash. She states that the rash is pruritic. She pulled a tick off her leg about a week ago. Rash started late last night. No alleviating measures.  Past Medical History:  Diagnosis Date  . Cancer ([place])   . Cataract   . Chronic kidney disease (CKD), stage III (moderate) (HCC)   . Chronic urticaria   . COPD (chronic obstructive pulmonary disease) ([place])   . Depression   . Diabetes mellitus without complication ([place])   . DVT (deep venous thrombosis) (HCC)    left leg  . Hyperlipemia   . Hypertension   . Osteoarthritis   . Osteopenia   . Polymyalgia rheumatica ([place])   . Polyneuropathy   . Renal cancer ([place])   . Renal insufficiency 08/10/2016   Chart indicates CKD stage 3  . Restless leg syndrome   . Vitamin D deficiency   . Vulvar intraepithelial neoplasia with lichen sclerosus     Patient Active Problem List   Diagnosis Date Noted  . Recurrent UTI 10/20/2018  . Pressure injury of skin 08/15/2018  . Weakness 06/26/2018  . Hypoglycemia 04/30/2018  . UTI (urinary tract infection) 03/15/2018  . Acute encephalopathy 12/19/2016  . Generalized weakness 12/19/2016  . Acute on chronic respiratory failure with hypoxia and hypercapnia ([place]) 12/16/2016  . COPD exacerbation ([place]) 12/16/2016  . Community acquired pneumonia 12/16/2016  . Lactic acidosis 12/16/2016  . Sepsis ([place]) 12/16/2016  . PE (pulmonary embolism) 08/11/2016  . COPD (chronic obstructive pulmonary disease) ([place]) 03/29/2015  . Hypertension 03/29/2015  . Diabetes ([place]) 03/29/2015  . Hyperlipidemia 03/29/2015  . Depression 03/29/2015  . Restless leg syndrome  03/29/2015  . DVT (deep venous thrombosis) ([place]) 03/29/2015  . CKD (chronic kidney disease), stage III ([place]) 03/29/2015    Past Surgical History:  Procedure Laterality Date  . ABDOMINAL HYSTERECTOMY    . APPENDECTOMY    . CATARACT EXTRACTION    . COLONOSCOPY    . JOINT REPLACEMENT Bilateral    hip  . LAMINOTOMY    . NEPHRECTOMY    . TEMPORAL ARTERY BIOPSY / LIGATION    . TONSILLECTOMY      Prior to Admission medications   Medication Sig Start Date End Date Taking? Authorizing Provider  albuterol (PROVENTIL) (2.5 MG/3ML) 0.083% nebulizer solution Take 3 mLs (2.5 mg total) by nebulization every 4 (four) hours. 12/19/16   Theodoro Grist, MD  amLODipine (NORVASC) 5 MG tablet Take 1 tablet (5 mg total) by mouth daily. 05/03/18   Gladstone Lighter, MD  apixaban (ELIQUIS) 5 MG TABS tablet Take 1 tablet (5 mg total) by mouth 2 (two) times daily. Patient not taking: Reported on 10/20/2018 05/03/18   Gladstone Lighter, MD  aspirin EC 81 MG tablet Take 81 mg by mouth daily.    [provider]  atenolol (TENORMIN) 25 MG tablet Take 25 mg by mouth 2 (two) times daily.    [provider]  Calcium Carb-Cholecalciferol (CALCIUM 600/VITAMIN D3) 600-800 MG-UNIT TABS Take 600 mg by mouth daily.    [provider]  cephALEXin (KEFLEX) 500 MG capsule Take 1 capsule (500 mg total) by mouth 3 (three) times daily for 7 days. 04/18/19 04/25/19  Sherral Hammers,  Conley Rolls, PA-C  conjugated estrogens (PREMARIN) vaginal cream Place 1 Applicatorful vaginally daily. Use pea sized amount M-W-Fr before bedtime 10/02/18   Hollice Espy, MD  diphenhydramine-acetaminophen (TYLENOL PM) 25-500 MG TABS tablet Take 1 tablet by mouth at bedtime as needed (sleep or pain).    [provider]  diphenhydrAMINE-zinc acetate (BENADRYL EXTRA STRENGTH) cream Apply 1 application topically 3 (three) times daily as needed for itching. 04/18/19   Lannie Fields, PA-C  doxycycline (VIBRAMYCIN) 100 MG capsule Take 1  capsule (100 mg total) by mouth 2 (two) times daily for 7 days. 04/18/19 04/25/19  Lannie Fields, PA-C  FLUoxetine (PROZAC) 20 MG capsule Take 20 mg by mouth daily.    [provider]  furosemide (LASIX) 20 MG tablet Take 20 mg by mouth daily.    [provider]  gabapentin (NEURONTIN) 300 MG capsule Take 1 capsule (300MG ) by mouth every morning, 1 capsule (300MG ) every evening and 3 capsules (900MG ) at bedtime    [provider]  insulin aspart (NOVOLOG) 100 UNIT/ML injection Inject 0-9 Units into the skin 3 (three) times daily with meals. CBG 70 - 120: 0 units CBG 121 - 150: 1 unit CBG 151 - 200: 2 units CBG 201 - 250: 3 units CBG 251 - 300: 5 units CBG 301 - 350: 7 units CBG 351 - 400: 9 units CBG > 400: call MD 08/16/18   Gladstone Lighter, MD  insulin glargine (LANTUS) 100 UNIT/ML injection Inject 0.25 mLs (25 Units total) into the skin daily. 10/21/18   Bettey Costa, MD  Multiple Minerals-Vitamins (GNP CAL MAG ZINC +D3 PO) Take 1 tablet by mouth daily.    [provider]  pravastatin (PRAVACHOL) 20 MG tablet Take 20 mg by mouth every evening.     [provider]  predniSONE (DELTASONE) 5 MG tablet Take 7.5 mg by mouth daily.     [provider]  vitamin B-12 (CYANOCOBALAMIN) 1000 MCG tablet Take 1,000 mcg by mouth daily.    [provider]  Vitamin D, Ergocalciferol, (DRISDOL) 50000 UNITS CAPS capsule Take 50,000 Units by mouth every Monday.     [provider]    Allergies Fosamax [alendronate sodium] and Wellbutrin [bupropion]  Family History  Problem Relation Age of Onset  . Hypertension Other   . Diabetes Other     Social History Social History   Tobacco Use  . Smoking status: Never Smoker  . Smokeless tobacco: Never Used  Substance Use Topics  . Alcohol use: No    Alcohol/week: 0.0 standard drinks  . Drug use: No    Review of Systems  Constitutional: Negataive for fever. Respiratory: Negative  for cough or shortness of breath.  Musculoskeletal: Negative for myalgias Skin: Positive for diffuse rash over anterior chest, upper and lower extremities Neurological: Negative for numbness or paresthesias. ____________________________________________   PHYSICAL EXAM:  VITAL SIGNS: ED Triage Vitals  Enc Vitals Group     BP 04/18/19 1020 (!) 128/41     Pulse Rate 04/18/19 1020 71     Resp 04/18/19 1020 17     Temp 04/18/19 1020 97.7 F (36.5 C)     Temp Source 04/18/19 1020 Oral     SpO2 04/18/19 1020 99 %     Weight 04/18/19 1019 155 lb (70.3 kg)     Height 04/18/19 1019 5' (1.524 m)     Head Circumference --      Peak Flow --      Pain  Score 04/18/19 1018 0     Pain Loc --      Pain Edu? --      Excl. in [place]? --      Constitutional: Uncomfortable appearing. Eyes: Conjunctivae are clear without discharge or drainage. Nose: No rhinorrhea noted. Mouth/Throat: Airway is patent.  Neck: No stridor. Unrestricted range of motion observed. Cardiovascular: Capillary refill is <3 seconds.  Respiratory: Respirations are even and unlabored.. Musculoskeletal: Unrestricted range of motion observed. Neurologic: Awake, alert, and oriented x 4.  Skin:  Erythematous, maculopapular rash over chest wall, upper and lower extremities. Rash blanches. Scaling noted in the area of rash on lower extremities. No vesicles or open/draining/weeping.  ____________________________________________   LABS (all labs ordered are listed, but only abnormal results are displayed)  Labs Reviewed  CBC WITH DIFFERENTIAL/PLATELET - Abnormal; Notable for the following components:      Result Value   WBC 14.0 (*)    Hemoglobin 11.8 (*)    RDW 18.2 (*)    Neutro Abs 10.5 (*)    Eosinophils Absolute 1.0 (*)    Abs Immature Granulocytes 0.10 (*)    All other components within normal limits  COMPREHENSIVE METABOLIC PANEL - Abnormal; Notable for the following components:   Glucose, Bld 138 (*)    BUN 27 (*)     Creatinine, Ser 1.41 (*)    Total Protein 6.4 (*)    Albumin 3.2 (*)    AST 14 (*)    GFR calc non Af Amer 33 (*)    GFR calc Af Amer 39 (*)    All other components within normal limits  URINALYSIS, COMPLETE (UACMP) WITH MICROSCOPIC - Abnormal; Notable for the following components:   Color, Urine STRAW (*)    APPearance CLEAR (*)    Specific Gravity, Urine 1.004 (*)    Bacteria, UA MANY (*)    All other components within normal limits  SARS CORONAVIRUS 2 (HOSPITAL ORDER, [place] LAB)  TROPONIN I  ROCKY MTN SPOTTED FVR ABS PNL(IGG+IGM)  LYME DISEASE, WESTERN BLOT   ____________________________________________  EKG  ED ECG REPORT I, Robley [place], FNP-BC personally viewed and interpreted this ECG.   Date: 04/20/2019  Rate: 59  Rhythm: unchanged from previous tracings, sinus bradycardia, LBBB, left axis deviation  Axis: left  Intervals:left bundle branch block  ST&T Change: no ST elevation  ____________________________________________  RADIOLOGY  Chest x-ray is negative for acute findings. ____________________________________________   PROCEDURES  Procedures ____________________________________________   INITIAL IMPRESSION / ASSESSMENT AND PLAN / ED COURSE  MONEE DEMBECK is a 85 y.o. female who presents to the emergency department for treatment and evaluation of rash. She also had some vague complaint of shortness of breath. Labs and chest x-ray pending.  Care relinquished to [facility], PA-C who will follow patient to disposition.   Medications  diphenhydrAMINE (BENADRYL) injection 12.5 mg (12.5 mg Intravenous Given 04/18/19 1326)  famotidine (PEPCID) IVPB 20 mg premix (0 mg Intravenous Stopped 04/18/19 1455)  methylPREDNISolone sodium succinate (SOLU-MEDROL) 125 mg/2 mL injection 80 mg (80 mg Intravenous Given 04/18/19 1324)     Pertinent labs & imaging results that were available during my care of the patient were reviewed by me  and considered in my medical decision making (see chart for details).  ____________________________________________   FINAL CLINICAL IMPRESSION(S) / ED DIAGNOSES  Final diagnoses:  Rash    ED Discharge Orders         Ordered    doxycycline (VIBRAMYCIN)  100 MG capsule  2 times daily     04/18/19 1633    cephALEXin (KEFLEX) 500 MG capsule  3 times daily     04/18/19 1633    diphenhydrAMINE-zinc acetate (BENADRYL EXTRA STRENGTH) cream  3 times daily PRN     04/18/19 1633           Note:  This document was prepared using Dragon voice recognition software and may include unintentional dictation errors.   Victorino Dike, FNP 04/20/19 4401    Nena Polio, MD 04/21/19 [phone]

## 2019-04-18 NOTE — ED Notes (Signed)
Red, rash noted to arms and torso. Patient sates she noticed it last night.

## 2019-04-18 NOTE — ED Provider Notes (Signed)
  Physical Exam  BP (!) 139/59 (BP Location: Left Arm)   Pulse 66   Temp 97.7 F (36.5 C) (Oral)   Resp 18   Ht 5' (1.524 m)   Wt 70.3 kg   LMP  (LMP Unknown)   SpO2 97%   BMI 30.27 kg/m   Physical Exam  ED Course/Procedures   Clinical Course as of Apr 17 1633  Sun Apr 18, 2019  1311 Lyme disease dna by pcr(borrelia burg) [CT]    Clinical Course User Index [CT] Victorino Dike, FNP    Procedures  MDM   Assumed patient care for [place], NP.  On physical exam, patient had an erythematous, maculopapular rash of chest, upper extremities and lower extremities.  Rash on lower extremities had a plaque-like appearance and rash on chest had regions of crusting.  Rash spares skin along the web spacing of the hands and along the back.  Patient reports that she had a tick bite approximately a week ago from her dog which lives inside with her.  Patient denies spending time outside.  No new contact exposures.  No one in the house has similar symptoms.  Patient denies starting new medications.  Patient describes rash as pruritic.  Rash does not follow a dermatomal distribution and she denies a prodrome of burning and tingling.  She has never had a similar rash in the past.  No fever or chills at home.  Patient denies nausea and vomiting.  Patient denies weakness.  Patient states that her rash is not significantly changed since receiving Benadryl, Solu-Medrol and famotidine in the emergency department.  Basic labs were concerning for mild leukocytosis, WBC 14.  Blood work was otherwise reassuring.  Patient was treated for contact dermatitis with topical Benadryl cream, doxycycline and Benadryl.  Patient resides with her daughter and her son-in-law and and she states that she has easy access to the emergency department if her symptoms worsen.  She feels comfortable being discharged from the emergency department.  All patient questions were answered.     Vallarie Mare [place], PA-C 04/18/19  1640    Earleen Newport, MD 04/18/19 [phone]

## 2019-04-18 NOTE — Discharge Instructions (Addendum)
I want you to take Keflex 3 times a day and doxycycline 2 times a day for the next week. You have also been prescribed a Benadryl topical cream for itching. I want you to return to the emergency department if you develop a fever or if rash appears to be worsening.

## 2019-04-18 NOTE — ED Notes (Signed)
X-ray at bedside

## 2019-04-18 NOTE — ED Notes (Signed)
Attempted to take patient to restroom. Patient reports she cannot pee right now, will try again later. PAtient given soda and sandwich tray

## 2019-04-18 NOTE — ED Notes (Signed)
Patient assisted to bathroom and then back to room. Patient requesting to stay in wheelchair at this time. PA Jacki at bedside at this time. Call bell next to patient

## 2019-04-18 NOTE — ED Triage Notes (Signed)
Pt c/o itchy rash all over since last night, denies having any known allergies. No difficulty breathing or other sx on arrival.

## 2019-04-18 NOTE — ED Notes (Signed)
Patient assisted to toilet and back to bed. Patients wet brief changed

## 2019-04-22 LAB — LYME DISEASE, WESTERN BLOT
IgG P18 Ab.: ABSENT
IgG P28 Ab.: ABSENT
IgG P30 Ab.: ABSENT
IgG P39 Ab.: ABSENT
IgG P41 Ab.: ABSENT
IgG P45 Ab.: ABSENT
IgG P58 Ab.: ABSENT
IgG P66 Ab.: ABSENT
IgG P93 Ab.: ABSENT
IgM P23 Ab.: ABSENT
IgM P39 Ab.: ABSENT
IgM P41 Ab.: ABSENT
Lyme IgG Wb: NEGATIVE
Lyme IgM Wb: NEGATIVE

## 2019-04-22 LAB — RMSF, IGG, IFA: RMSF, IGG, IFA: <1:64 {titer}

## 2019-04-22 LAB — ROCKY MTN SPOTTED FVR ABS PNL(IGG+IGM)
RMSF IgG: POSITIVE — AB
RMSF IgM: 0.2 index (ref 0.00–0.89)

## 2019-04-26 DIAGNOSIS — E1122 Type 2 diabetes mellitus with diabetic chronic kidney disease: Secondary | ICD-10-CM | POA: Diagnosis not present

## 2019-04-26 DIAGNOSIS — D729 Disorder of white blood cells, unspecified: Secondary | ICD-10-CM | POA: Diagnosis not present

## 2019-04-26 DIAGNOSIS — R238 Other skin changes: Secondary | ICD-10-CM | POA: Diagnosis not present

## 2019-04-26 DIAGNOSIS — N183 Chronic kidney disease, stage 3 (moderate): Secondary | ICD-10-CM | POA: Diagnosis not present

## 2019-04-26 DIAGNOSIS — L309 Dermatitis, unspecified: Secondary | ICD-10-CM | POA: Diagnosis not present

## 2019-04-26 DIAGNOSIS — Z794 Long term (current) use of insulin: Secondary | ICD-10-CM | POA: Diagnosis not present

## 2019-04-27 DIAGNOSIS — L2389 Allergic contact dermatitis due to other agents: Secondary | ICD-10-CM | POA: Diagnosis not present

## 2019-05-15 DIAGNOSIS — I10 Essential (primary) hypertension: Secondary | ICD-10-CM | POA: Diagnosis not present

## 2019-05-15 DIAGNOSIS — R0902 Hypoxemia: Secondary | ICD-10-CM | POA: Diagnosis not present

## 2019-05-15 DIAGNOSIS — M6080 Other myositis, unspecified site: Secondary | ICD-10-CM | POA: Diagnosis not present

## 2019-06-14 DIAGNOSIS — R0902 Hypoxemia: Secondary | ICD-10-CM | POA: Diagnosis not present

## 2019-06-14 DIAGNOSIS — M6080 Other myositis, unspecified site: Secondary | ICD-10-CM | POA: Diagnosis not present

## 2019-06-14 DIAGNOSIS — I10 Essential (primary) hypertension: Secondary | ICD-10-CM | POA: Diagnosis not present

## 2019-07-15 DIAGNOSIS — G629 Polyneuropathy, unspecified: Secondary | ICD-10-CM | POA: Diagnosis not present

## 2019-07-15 DIAGNOSIS — Z79899 Other long term (current) drug therapy: Secondary | ICD-10-CM | POA: Diagnosis not present

## 2019-07-15 DIAGNOSIS — J9611 Chronic respiratory failure with hypoxia: Secondary | ICD-10-CM | POA: Diagnosis not present

## 2019-07-15 DIAGNOSIS — R0902 Hypoxemia: Secondary | ICD-10-CM | POA: Diagnosis not present

## 2019-07-15 DIAGNOSIS — E782 Mixed hyperlipidemia: Secondary | ICD-10-CM | POA: Diagnosis not present

## 2019-07-15 DIAGNOSIS — N183 Chronic kidney disease, stage 3 (moderate): Secondary | ICD-10-CM | POA: Diagnosis not present

## 2019-07-15 DIAGNOSIS — E1169 Type 2 diabetes mellitus with other specified complication: Secondary | ICD-10-CM | POA: Diagnosis not present

## 2019-07-15 DIAGNOSIS — M6080 Other myositis, unspecified site: Secondary | ICD-10-CM | POA: Diagnosis not present

## 2019-07-15 DIAGNOSIS — Z794 Long term (current) use of insulin: Secondary | ICD-10-CM | POA: Diagnosis not present

## 2019-07-15 DIAGNOSIS — M353 Polymyalgia rheumatica: Secondary | ICD-10-CM | POA: Diagnosis not present

## 2019-07-15 DIAGNOSIS — J439 Emphysema, unspecified: Secondary | ICD-10-CM | POA: Diagnosis not present

## 2019-07-15 DIAGNOSIS — Z0000 Encounter for general adult medical examination without abnormal findings: Secondary | ICD-10-CM | POA: Diagnosis not present

## 2019-07-15 DIAGNOSIS — E1122 Type 2 diabetes mellitus with diabetic chronic kidney disease: Secondary | ICD-10-CM | POA: Diagnosis not present

## 2019-07-15 DIAGNOSIS — I10 Essential (primary) hypertension: Secondary | ICD-10-CM | POA: Diagnosis not present

## 2019-07-15 DIAGNOSIS — N390 Urinary tract infection, site not specified: Secondary | ICD-10-CM | POA: Diagnosis not present

## 2019-07-15 DIAGNOSIS — F0151 Vascular dementia with behavioral disturbance: Secondary | ICD-10-CM | POA: Diagnosis not present

## 2019-07-15 DIAGNOSIS — E1159 Type 2 diabetes mellitus with other circulatory complications: Secondary | ICD-10-CM | POA: Diagnosis not present

## 2019-08-15 DIAGNOSIS — I10 Essential (primary) hypertension: Secondary | ICD-10-CM | POA: Diagnosis not present

## 2019-08-15 DIAGNOSIS — M6080 Other myositis, unspecified site: Secondary | ICD-10-CM | POA: Diagnosis not present

## 2019-08-15 DIAGNOSIS — R0902 Hypoxemia: Secondary | ICD-10-CM | POA: Diagnosis not present

## 2019-09-14 DIAGNOSIS — M6080 Other myositis, unspecified site: Secondary | ICD-10-CM | POA: Diagnosis not present

## 2019-09-14 DIAGNOSIS — I10 Essential (primary) hypertension: Secondary | ICD-10-CM | POA: Diagnosis not present

## 2019-09-14 DIAGNOSIS — R0902 Hypoxemia: Secondary | ICD-10-CM | POA: Diagnosis not present

## 2019-10-15 DIAGNOSIS — M6080 Other myositis, unspecified site: Secondary | ICD-10-CM | POA: Diagnosis not present

## 2019-10-15 DIAGNOSIS — R0902 Hypoxemia: Secondary | ICD-10-CM | POA: Diagnosis not present

## 2019-10-15 DIAGNOSIS — I10 Essential (primary) hypertension: Secondary | ICD-10-CM | POA: Diagnosis not present

## 2019-10-19 DIAGNOSIS — R1310 Dysphagia, unspecified: Secondary | ICD-10-CM | POA: Diagnosis not present

## 2019-10-19 DIAGNOSIS — J441 Chronic obstructive pulmonary disease with (acute) exacerbation: Secondary | ICD-10-CM | POA: Diagnosis not present

## 2019-10-19 DIAGNOSIS — J9621 Acute and chronic respiratory failure with hypoxia: Secondary | ICD-10-CM | POA: Diagnosis not present

## 2019-10-19 DIAGNOSIS — I5030 Unspecified diastolic (congestive) heart failure: Secondary | ICD-10-CM | POA: Diagnosis not present

## 2019-10-19 DIAGNOSIS — E46 Unspecified protein-calorie malnutrition: Secondary | ICD-10-CM | POA: Diagnosis not present

## 2019-11-09 DIAGNOSIS — F418 Other specified anxiety disorders: Secondary | ICD-10-CM | POA: Diagnosis not present

## 2019-11-14 DIAGNOSIS — I10 Essential (primary) hypertension: Secondary | ICD-10-CM | POA: Diagnosis not present

## 2019-11-14 DIAGNOSIS — M6080 Other myositis, unspecified site: Secondary | ICD-10-CM | POA: Diagnosis not present

## 2019-11-14 DIAGNOSIS — R0902 Hypoxemia: Secondary | ICD-10-CM | POA: Diagnosis not present

## 2019-12-15 DIAGNOSIS — M6080 Other myositis, unspecified site: Secondary | ICD-10-CM | POA: Diagnosis not present

## 2019-12-15 DIAGNOSIS — I10 Essential (primary) hypertension: Secondary | ICD-10-CM | POA: Diagnosis not present

## 2019-12-15 DIAGNOSIS — R0902 Hypoxemia: Secondary | ICD-10-CM | POA: Diagnosis not present

## 2020-01-15 DIAGNOSIS — I10 Essential (primary) hypertension: Secondary | ICD-10-CM | POA: Diagnosis not present

## 2020-01-15 DIAGNOSIS — R0902 Hypoxemia: Secondary | ICD-10-CM | POA: Diagnosis not present

## 2020-01-15 DIAGNOSIS — M6080 Other myositis, unspecified site: Secondary | ICD-10-CM | POA: Diagnosis not present

## 2020-02-08 DIAGNOSIS — E559 Vitamin D deficiency, unspecified: Secondary | ICD-10-CM | POA: Diagnosis not present

## 2020-02-08 DIAGNOSIS — J439 Emphysema, unspecified: Secondary | ICD-10-CM | POA: Diagnosis not present

## 2020-02-08 DIAGNOSIS — Z794 Long term (current) use of insulin: Secondary | ICD-10-CM | POA: Diagnosis not present

## 2020-02-08 DIAGNOSIS — N1832 Chronic kidney disease, stage 3b: Secondary | ICD-10-CM | POA: Diagnosis not present

## 2020-02-08 DIAGNOSIS — E785 Hyperlipidemia, unspecified: Secondary | ICD-10-CM | POA: Diagnosis not present

## 2020-02-08 DIAGNOSIS — E1142 Type 2 diabetes mellitus with diabetic polyneuropathy: Secondary | ICD-10-CM | POA: Diagnosis not present

## 2020-02-08 DIAGNOSIS — I152 Hypertension secondary to endocrine disorders: Secondary | ICD-10-CM | POA: Diagnosis not present

## 2020-02-08 DIAGNOSIS — N183 Chronic kidney disease, stage 3 unspecified: Secondary | ICD-10-CM | POA: Diagnosis not present

## 2020-02-08 DIAGNOSIS — E1121 Type 2 diabetes mellitus with diabetic nephropathy: Secondary | ICD-10-CM | POA: Diagnosis not present

## 2020-02-08 DIAGNOSIS — E1169 Type 2 diabetes mellitus with other specified complication: Secondary | ICD-10-CM | POA: Diagnosis not present

## 2020-02-08 DIAGNOSIS — D631 Anemia in chronic kidney disease: Secondary | ICD-10-CM | POA: Diagnosis not present

## 2020-02-08 DIAGNOSIS — E1122 Type 2 diabetes mellitus with diabetic chronic kidney disease: Secondary | ICD-10-CM | POA: Diagnosis not present

## 2020-02-08 DIAGNOSIS — H6123 Impacted cerumen, bilateral: Secondary | ICD-10-CM | POA: Diagnosis not present

## 2020-02-08 DIAGNOSIS — Z79899 Other long term (current) drug therapy: Secondary | ICD-10-CM | POA: Diagnosis not present

## 2020-02-08 DIAGNOSIS — E1159 Type 2 diabetes mellitus with other circulatory complications: Secondary | ICD-10-CM | POA: Diagnosis not present

## 2020-02-12 DIAGNOSIS — I10 Essential (primary) hypertension: Secondary | ICD-10-CM | POA: Diagnosis not present

## 2020-02-12 DIAGNOSIS — M6080 Other myositis, unspecified site: Secondary | ICD-10-CM | POA: Diagnosis not present

## 2020-02-12 DIAGNOSIS — R0902 Hypoxemia: Secondary | ICD-10-CM | POA: Diagnosis not present

## 2020-03-14 DIAGNOSIS — M6080 Other myositis, unspecified site: Secondary | ICD-10-CM | POA: Diagnosis not present

## 2020-03-14 DIAGNOSIS — I10 Essential (primary) hypertension: Secondary | ICD-10-CM | POA: Diagnosis not present

## 2020-03-14 DIAGNOSIS — R0902 Hypoxemia: Secondary | ICD-10-CM | POA: Diagnosis not present

## 2020-04-13 DIAGNOSIS — I10 Essential (primary) hypertension: Secondary | ICD-10-CM | POA: Diagnosis not present

## 2020-04-13 DIAGNOSIS — M6080 Other myositis, unspecified site: Secondary | ICD-10-CM | POA: Diagnosis not present

## 2020-04-13 DIAGNOSIS — R0902 Hypoxemia: Secondary | ICD-10-CM | POA: Diagnosis not present

## 2020-05-14 DIAGNOSIS — M6080 Other myositis, unspecified site: Secondary | ICD-10-CM | POA: Diagnosis not present

## 2020-05-14 DIAGNOSIS — I10 Essential (primary) hypertension: Secondary | ICD-10-CM | POA: Diagnosis not present

## 2020-05-14 DIAGNOSIS — R0902 Hypoxemia: Secondary | ICD-10-CM | POA: Diagnosis not present

## 2020-06-13 DIAGNOSIS — M6080 Other myositis, unspecified site: Secondary | ICD-10-CM | POA: Diagnosis not present

## 2020-06-13 DIAGNOSIS — I10 Essential (primary) hypertension: Secondary | ICD-10-CM | POA: Diagnosis not present

## 2020-06-13 DIAGNOSIS — R0902 Hypoxemia: Secondary | ICD-10-CM | POA: Diagnosis not present

## 2020-08-20 ENCOUNTER — Encounter: Payer: Self-pay | Admitting: Emergency Medicine

## 2020-08-20 DIAGNOSIS — R109 Unspecified abdominal pain: Secondary | ICD-10-CM | POA: Diagnosis not present

## 2020-08-20 DIAGNOSIS — N189 Chronic kidney disease, unspecified: Secondary | ICD-10-CM | POA: Diagnosis not present

## 2020-08-20 DIAGNOSIS — E785 Hyperlipidemia, unspecified: Secondary | ICD-10-CM | POA: Diagnosis present

## 2020-08-20 DIAGNOSIS — M6281 Muscle weakness (generalized): Secondary | ICD-10-CM | POA: Diagnosis not present

## 2020-08-20 DIAGNOSIS — J479 Bronchiectasis, uncomplicated: Secondary | ICD-10-CM | POA: Diagnosis not present

## 2020-08-20 DIAGNOSIS — N1831 Chronic kidney disease, stage 3a: Secondary | ICD-10-CM | POA: Diagnosis not present

## 2020-08-20 DIAGNOSIS — I4891 Unspecified atrial fibrillation: Secondary | ICD-10-CM | POA: Diagnosis not present

## 2020-08-20 DIAGNOSIS — I951 Orthostatic hypotension: Secondary | ICD-10-CM | POA: Diagnosis present

## 2020-08-20 DIAGNOSIS — Z79899 Other long term (current) drug therapy: Secondary | ICD-10-CM

## 2020-08-20 DIAGNOSIS — L89152 Pressure ulcer of sacral region, stage 2: Secondary | ICD-10-CM | POA: Diagnosis not present

## 2020-08-20 DIAGNOSIS — R627 Adult failure to thrive: Secondary | ICD-10-CM | POA: Diagnosis present

## 2020-08-20 DIAGNOSIS — R5381 Other malaise: Secondary | ICD-10-CM | POA: Diagnosis not present

## 2020-08-20 DIAGNOSIS — B9620 Unspecified Escherichia coli [E. coli] as the cause of diseases classified elsewhere: Secondary | ICD-10-CM | POA: Diagnosis present

## 2020-08-20 DIAGNOSIS — M353 Polymyalgia rheumatica: Secondary | ICD-10-CM | POA: Diagnosis present

## 2020-08-20 DIAGNOSIS — R279 Unspecified lack of coordination: Secondary | ICD-10-CM | POA: Diagnosis not present

## 2020-08-20 DIAGNOSIS — K8020 Calculus of gallbladder without cholecystitis without obstruction: Secondary | ICD-10-CM | POA: Diagnosis present

## 2020-08-20 DIAGNOSIS — R0902 Hypoxemia: Secondary | ICD-10-CM | POA: Diagnosis not present

## 2020-08-20 DIAGNOSIS — Z90710 Acquired absence of both cervix and uterus: Secondary | ICD-10-CM

## 2020-08-20 DIAGNOSIS — Z7901 Long term (current) use of anticoagulants: Secondary | ICD-10-CM

## 2020-08-20 DIAGNOSIS — Z85528 Personal history of other malignant neoplasm of kidney: Secondary | ICD-10-CM | POA: Diagnosis not present

## 2020-08-20 DIAGNOSIS — Z7982 Long term (current) use of aspirin: Secondary | ICD-10-CM

## 2020-08-20 DIAGNOSIS — Z20822 Contact with and (suspected) exposure to covid-19: Secondary | ICD-10-CM | POA: Diagnosis present

## 2020-08-20 DIAGNOSIS — R1111 Vomiting without nausea: Secondary | ICD-10-CM | POA: Diagnosis not present

## 2020-08-20 DIAGNOSIS — J90 Pleural effusion, not elsewhere classified: Secondary | ICD-10-CM | POA: Diagnosis not present

## 2020-08-20 DIAGNOSIS — F329 Major depressive disorder, single episode, unspecified: Secondary | ICD-10-CM | POA: Diagnosis present

## 2020-08-20 DIAGNOSIS — J9811 Atelectasis: Secondary | ICD-10-CM | POA: Diagnosis present

## 2020-08-20 DIAGNOSIS — N179 Acute kidney failure, unspecified: Secondary | ICD-10-CM | POA: Diagnosis not present

## 2020-08-20 DIAGNOSIS — R531 Weakness: Secondary | ICD-10-CM | POA: Diagnosis present

## 2020-08-20 DIAGNOSIS — M8580 Other specified disorders of bone density and structure, unspecified site: Secondary | ICD-10-CM | POA: Diagnosis present

## 2020-08-20 DIAGNOSIS — F0390 Unspecified dementia without behavioral disturbance: Secondary | ICD-10-CM | POA: Diagnosis present

## 2020-08-20 DIAGNOSIS — I517 Cardiomegaly: Secondary | ICD-10-CM | POA: Diagnosis not present

## 2020-08-20 DIAGNOSIS — I129 Hypertensive chronic kidney disease with stage 1 through stage 4 chronic kidney disease, or unspecified chronic kidney disease: Secondary | ICD-10-CM | POA: Diagnosis present

## 2020-08-20 DIAGNOSIS — E1122 Type 2 diabetes mellitus with diabetic chronic kidney disease: Secondary | ICD-10-CM | POA: Diagnosis present

## 2020-08-20 DIAGNOSIS — E559 Vitamin D deficiency, unspecified: Secondary | ICD-10-CM | POA: Diagnosis present

## 2020-08-20 DIAGNOSIS — N390 Urinary tract infection, site not specified: Principal | ICD-10-CM | POA: Diagnosis present

## 2020-08-20 DIAGNOSIS — Z888 Allergy status to other drugs, medicaments and biological substances status: Secondary | ICD-10-CM

## 2020-08-20 DIAGNOSIS — K5900 Constipation, unspecified: Secondary | ICD-10-CM | POA: Diagnosis present

## 2020-08-20 DIAGNOSIS — K828 Other specified diseases of gallbladder: Secondary | ICD-10-CM | POA: Diagnosis not present

## 2020-08-20 DIAGNOSIS — Z96643 Presence of artificial hip joint, bilateral: Secondary | ICD-10-CM | POA: Diagnosis present

## 2020-08-20 DIAGNOSIS — Z87412 Personal history of vulvar dysplasia: Secondary | ICD-10-CM

## 2020-08-20 DIAGNOSIS — E1142 Type 2 diabetes mellitus with diabetic polyneuropathy: Secondary | ICD-10-CM | POA: Diagnosis present

## 2020-08-20 DIAGNOSIS — I959 Hypotension, unspecified: Secondary | ICD-10-CM | POA: Diagnosis not present

## 2020-08-20 DIAGNOSIS — Z86718 Personal history of other venous thrombosis and embolism: Secondary | ICD-10-CM

## 2020-08-20 DIAGNOSIS — Z833 Family history of diabetes mellitus: Secondary | ICD-10-CM

## 2020-08-20 DIAGNOSIS — Z8249 Family history of ischemic heart disease and other diseases of the circulatory system: Secondary | ICD-10-CM

## 2020-08-20 DIAGNOSIS — K861 Other chronic pancreatitis: Secondary | ICD-10-CM | POA: Diagnosis present

## 2020-08-20 DIAGNOSIS — R52 Pain, unspecified: Secondary | ICD-10-CM | POA: Diagnosis not present

## 2020-08-20 DIAGNOSIS — K6289 Other specified diseases of anus and rectum: Secondary | ICD-10-CM | POA: Diagnosis not present

## 2020-08-20 DIAGNOSIS — G4700 Insomnia, unspecified: Secondary | ICD-10-CM | POA: Diagnosis present

## 2020-08-20 DIAGNOSIS — R000 Tachycardia, unspecified: Secondary | ICD-10-CM | POA: Diagnosis not present

## 2020-08-20 DIAGNOSIS — D631 Anemia in chronic kidney disease: Secondary | ICD-10-CM | POA: Diagnosis present

## 2020-08-20 DIAGNOSIS — G2581 Restless legs syndrome: Secondary | ICD-10-CM | POA: Diagnosis present

## 2020-08-20 DIAGNOSIS — H9190 Unspecified hearing loss, unspecified ear: Secondary | ICD-10-CM | POA: Diagnosis present

## 2020-08-20 DIAGNOSIS — R112 Nausea with vomiting, unspecified: Secondary | ICD-10-CM | POA: Diagnosis not present

## 2020-08-20 DIAGNOSIS — Z794 Long term (current) use of insulin: Secondary | ICD-10-CM

## 2020-08-20 DIAGNOSIS — I10 Essential (primary) hypertension: Secondary | ICD-10-CM | POA: Diagnosis not present

## 2020-08-20 DIAGNOSIS — Z905 Acquired absence of kidney: Secondary | ICD-10-CM

## 2020-08-20 DIAGNOSIS — L89322 Pressure ulcer of left buttock, stage 2: Secondary | ICD-10-CM | POA: Diagnosis not present

## 2020-08-20 LAB — CBC
HCT: 34.8 % — ABNORMAL LOW (ref 36.0–46.0)
Hemoglobin: 10.6 g/dL — ABNORMAL LOW (ref 12.0–15.0)
MCH: 27.4 pg (ref 26.0–34.0)
MCHC: 30.5 g/dL (ref 30.0–36.0)
MCV: 89.9 fL (ref 80.0–100.0)
Platelets: 213 10*3/uL (ref 150–400)
RBC: 3.87 MIL/uL (ref 3.87–5.11)
RDW: 15.8 % — ABNORMAL HIGH (ref 11.5–15.5)
WBC: 7.2 10*3/uL (ref 4.0–10.5)
nRBC: 0 % (ref 0.0–0.2)

## 2020-08-20 LAB — COMPREHENSIVE METABOLIC PANEL
ALT: 12 U/L (ref 0–44)
AST: 17 U/L (ref 15–41)
Albumin: 3.1 g/dL — ABNORMAL LOW (ref 3.5–5.0)
Alkaline Phosphatase: 70 U/L (ref 38–126)
Anion gap: 10 (ref 5–15)
BUN: 25 mg/dL — ABNORMAL HIGH (ref 8–23)
CO2: 30 mmol/L (ref 22–32)
Calcium: 9 mg/dL (ref 8.9–10.3)
Chloride: 99 mmol/L (ref 98–111)
Creatinine, Ser: 1.17 mg/dL — ABNORMAL HIGH (ref 0.44–1.00)
GFR calc Af Amer: 48 mL/min — ABNORMAL LOW (ref >60)
GFR calc non Af Amer: 41 mL/min — ABNORMAL LOW (ref >60)
Glucose, Bld: 124 mg/dL — ABNORMAL HIGH (ref 70–99)
Potassium: 4.1 mmol/L (ref 3.5–5.1)
Sodium: 139 mmol/L (ref 135–145)
Total Bilirubin: 0.4 mg/dL (ref 0.3–1.2)
Total Protein: 6.6 g/dL (ref 6.5–8.1)

## 2020-08-20 LAB — LIPASE, BLOOD: Lipase: 25 U/L (ref 11–51)

## 2020-08-20 LAB — LACTIC ACID, PLASMA: Lactic Acid, Venous: 1.2 mmol/L (ref 0.5–1.9)

## 2020-08-20 NOTE — ED Triage Notes (Signed)
Pt arrived via EMS from home where she lives with family who reported weakness and strong urine smell x3 days. EMS reports pt was sitting in recliner with urine soaked through and into the floor. Poor living conditions per EMS and concern for living situation and families ability to take care of pt.

## 2020-08-21 ENCOUNTER — Inpatient Hospital Stay
Admission: EM | Admit: 2020-08-21 | Discharge: 2020-08-24 | DRG: 690 | Disposition: A | Discharge status: Skilled Nursing Facility | Payer: Medicare HMO | Attending: Internal Medicine | Admitting: Internal Medicine

## 2020-08-21 ENCOUNTER — Emergency Department: Payer: Medicare HMO

## 2020-08-21 DIAGNOSIS — K861 Other chronic pancreatitis: Secondary | ICD-10-CM | POA: Diagnosis present

## 2020-08-21 DIAGNOSIS — G2581 Restless legs syndrome: Secondary | ICD-10-CM | POA: Diagnosis present

## 2020-08-21 DIAGNOSIS — R109 Unspecified abdominal pain: Secondary | ICD-10-CM

## 2020-08-21 DIAGNOSIS — M8580 Other specified disorders of bone density and structure, unspecified site: Secondary | ICD-10-CM | POA: Diagnosis present

## 2020-08-21 DIAGNOSIS — Z20822 Contact with and (suspected) exposure to covid-19: Secondary | ICD-10-CM | POA: Diagnosis present

## 2020-08-21 DIAGNOSIS — R531 Weakness: Secondary | ICD-10-CM | POA: Diagnosis not present

## 2020-08-21 DIAGNOSIS — E785 Hyperlipidemia, unspecified: Secondary | ICD-10-CM | POA: Diagnosis present

## 2020-08-21 DIAGNOSIS — B9620 Unspecified Escherichia coli [E. coli] as the cause of diseases classified elsewhere: Secondary | ICD-10-CM | POA: Diagnosis present

## 2020-08-21 DIAGNOSIS — I959 Hypotension, unspecified: Secondary | ICD-10-CM | POA: Diagnosis not present

## 2020-08-21 DIAGNOSIS — R112 Nausea with vomiting, unspecified: Secondary | ICD-10-CM | POA: Diagnosis not present

## 2020-08-21 DIAGNOSIS — L89152 Pressure ulcer of sacral region, stage 2: Secondary | ICD-10-CM

## 2020-08-21 DIAGNOSIS — K5900 Constipation, unspecified: Secondary | ICD-10-CM

## 2020-08-21 DIAGNOSIS — F0390 Unspecified dementia without behavioral disturbance: Secondary | ICD-10-CM | POA: Diagnosis present

## 2020-08-21 DIAGNOSIS — N390 Urinary tract infection, site not specified: Secondary | ICD-10-CM | POA: Diagnosis present

## 2020-08-21 DIAGNOSIS — E1122 Type 2 diabetes mellitus with diabetic chronic kidney disease: Secondary | ICD-10-CM | POA: Diagnosis present

## 2020-08-21 DIAGNOSIS — G4700 Insomnia, unspecified: Secondary | ICD-10-CM | POA: Diagnosis present

## 2020-08-21 DIAGNOSIS — R627 Adult failure to thrive: Secondary | ICD-10-CM | POA: Diagnosis not present

## 2020-08-21 DIAGNOSIS — I10 Essential (primary) hypertension: Secondary | ICD-10-CM | POA: Diagnosis not present

## 2020-08-21 DIAGNOSIS — K8020 Calculus of gallbladder without cholecystitis without obstruction: Secondary | ICD-10-CM | POA: Diagnosis present

## 2020-08-21 DIAGNOSIS — Z85528 Personal history of other malignant neoplasm of kidney: Secondary | ICD-10-CM | POA: Diagnosis not present

## 2020-08-21 DIAGNOSIS — J9811 Atelectasis: Secondary | ICD-10-CM | POA: Diagnosis present

## 2020-08-21 DIAGNOSIS — E559 Vitamin D deficiency, unspecified: Secondary | ICD-10-CM | POA: Diagnosis present

## 2020-08-21 DIAGNOSIS — I129 Hypertensive chronic kidney disease with stage 1 through stage 4 chronic kidney disease, or unspecified chronic kidney disease: Secondary | ICD-10-CM | POA: Diagnosis present

## 2020-08-21 DIAGNOSIS — I951 Orthostatic hypotension: Secondary | ICD-10-CM | POA: Diagnosis present

## 2020-08-21 DIAGNOSIS — N179 Acute kidney failure, unspecified: Secondary | ICD-10-CM | POA: Diagnosis present

## 2020-08-21 DIAGNOSIS — N189 Chronic kidney disease, unspecified: Secondary | ICD-10-CM | POA: Diagnosis not present

## 2020-08-21 DIAGNOSIS — M353 Polymyalgia rheumatica: Secondary | ICD-10-CM | POA: Diagnosis present

## 2020-08-21 DIAGNOSIS — N1831 Chronic kidney disease, stage 3a: Secondary | ICD-10-CM | POA: Diagnosis present

## 2020-08-21 DIAGNOSIS — L89322 Pressure ulcer of left buttock, stage 2: Secondary | ICD-10-CM | POA: Diagnosis not present

## 2020-08-21 DIAGNOSIS — E1142 Type 2 diabetes mellitus with diabetic polyneuropathy: Secondary | ICD-10-CM | POA: Diagnosis present

## 2020-08-21 LAB — URINALYSIS, COMPLETE (UACMP) WITH MICROSCOPIC
Bilirubin Urine: NEGATIVE
Glucose, UA: NEGATIVE mg/dL
Ketones, ur: NEGATIVE mg/dL
Nitrite: NEGATIVE
Protein, ur: NEGATIVE mg/dL
Specific Gravity, Urine: 1.009 (ref 1.005–1.030)
pH: 6 (ref 5.0–8.0)

## 2020-08-21 LAB — CBC
HCT: 35.1 % — ABNORMAL LOW (ref 36.0–46.0)
Hemoglobin: 10.7 g/dL — ABNORMAL LOW (ref 12.0–15.0)
MCH: 27.6 pg (ref 26.0–34.0)
MCHC: 30.5 g/dL (ref 30.0–36.0)
MCV: 90.5 fL (ref 80.0–100.0)
Platelets: 204 10*3/uL (ref 150–400)
RBC: 3.88 MIL/uL (ref 3.87–5.11)
RDW: 15.9 % — ABNORMAL HIGH (ref 11.5–15.5)
WBC: 6.4 10*3/uL (ref 4.0–10.5)
nRBC: 0 % (ref 0.0–0.2)

## 2020-08-21 LAB — HEMOGLOBIN A1C
Hgb A1c MFr Bld: 6 % — ABNORMAL HIGH (ref 4.8–5.6)
Mean Plasma Glucose: 125.5 mg/dL

## 2020-08-21 LAB — BASIC METABOLIC PANEL
Anion gap: 10 (ref 5–15)
BUN: 22 mg/dL (ref 8–23)
CO2: 27 mmol/L (ref 22–32)
Calcium: 8.6 mg/dL — ABNORMAL LOW (ref 8.9–10.3)
Chloride: 105 mmol/L (ref 98–111)
Creatinine, Ser: 1.06 mg/dL — ABNORMAL HIGH (ref 0.44–1.00)
GFR calc Af Amer: 54 mL/min — ABNORMAL LOW (ref >60)
GFR calc non Af Amer: 47 mL/min — ABNORMAL LOW (ref >60)
Glucose, Bld: 111 mg/dL — ABNORMAL HIGH (ref 70–99)
Potassium: 3.9 mmol/L (ref 3.5–5.1)
Sodium: 142 mmol/L (ref 135–145)

## 2020-08-21 LAB — GLUCOSE, CAPILLARY
Glucose-Capillary: 116 mg/dL — ABNORMAL HIGH (ref 70–99)
Glucose-Capillary: 160 mg/dL — ABNORMAL HIGH (ref 70–99)
Glucose-Capillary: 145 mg/dL — ABNORMAL HIGH (ref 70–99)

## 2020-08-21 LAB — RESPIRATORY PANEL BY RT PCR (FLU A&B, COVID)
Influenza A by PCR: NEGATIVE
Influenza B by PCR: NEGATIVE
SARS Coronavirus 2 by RT PCR: NEGATIVE

## 2020-08-21 LAB — TROPONIN I (HIGH SENSITIVITY): Troponin I (High Sensitivity): 9 ng/L (ref <18)

## 2020-08-21 LAB — LACTIC ACID, PLASMA: Lactic Acid, Venous: 0.8 mmol/L (ref 0.5–1.9)

## 2020-08-21 MED ORDER — SODIUM CHLORIDE 0.9 % IV SOLN
1.0000 g | INTRAVENOUS | Status: DC
  Administered 2020-08-21 – 2020-08-23 (×3): 1 g via INTRAVENOUS
  Filled 2020-08-21: qty 1
  Filled 2020-08-21 (×3): qty 10

## 2020-08-21 MED ORDER — TRAZODONE HCL 50 MG PO TABS
25.0000 mg | ORAL_TABLET | Freq: Every evening | ORAL | Status: DC | PRN
  Administered 2020-08-22: 25 mg via ORAL
  Filled 2020-08-21: qty 1

## 2020-08-21 MED ORDER — ONDANSETRON HCL 4 MG PO TABS: 4.0000 mg | ORAL_TABLET | Freq: Four times a day (QID) | ORAL | Status: DC | PRN

## 2020-08-21 MED ORDER — MAGNESIUM HYDROXIDE 400 MG/5ML PO SUSP: 30.0000 mL | Freq: Every day | ORAL | Status: DC | PRN

## 2020-08-21 MED ORDER — ACETAMINOPHEN 650 MG RE SUPP: 650.0000 mg | Freq: Four times a day (QID) | RECTAL | Status: DC | PRN

## 2020-08-21 MED ORDER — SODIUM CHLORIDE 0.9 % IV SOLN: INTRAVENOUS | Status: DC

## 2020-08-21 MED ORDER — ASPIRIN EC 81 MG PO TBEC
81.0000 mg | DELAYED_RELEASE_TABLET | Freq: Every day | ORAL | Status: DC
  Administered 2020-08-21 – 2020-08-24 (×4): 81 mg via ORAL
  Filled 2020-08-21 (×4): qty 1

## 2020-08-21 MED ORDER — MIDODRINE HCL 5 MG PO TABS
10.0000 mg | ORAL_TABLET | Freq: Three times a day (TID) | ORAL | Status: DC
  Administered 2020-08-21 – 2020-08-24 (×6): 10 mg via ORAL
  Filled 2020-08-21 (×9): qty 2

## 2020-08-21 MED ORDER — AMLODIPINE BESYLATE 5 MG PO TABS
2.5000 mg | ORAL_TABLET | Freq: Every day | ORAL | Status: DC
  Administered 2020-08-21 – 2020-08-23 (×3): 2.5 mg via ORAL
  Filled 2020-08-21 (×3): qty 1

## 2020-08-21 MED ORDER — SODIUM CHLORIDE 0.9 % IV BOLUS
500.0000 mL | Freq: Once | INTRAVENOUS | Status: AC
  Administered 2020-08-21: 500 mL via INTRAVENOUS

## 2020-08-21 MED ORDER — ENOXAPARIN SODIUM 40 MG/0.4ML ~~LOC~~ SOLN: 40.0000 mg | SUBCUTANEOUS | Status: DC

## 2020-08-21 MED ORDER — PRAVASTATIN SODIUM 20 MG PO TABS
20.0000 mg | ORAL_TABLET | Freq: Every evening | ORAL | Status: DC
  Administered 2020-08-22: 20 mg via ORAL
  Filled 2020-08-21 (×4): qty 1

## 2020-08-21 MED ORDER — GABAPENTIN 100 MG PO CAPS
100.0000 mg | ORAL_CAPSULE | Freq: Two times a day (BID) | ORAL | Status: DC
  Administered 2020-08-21 – 2020-08-24 (×6): 100 mg via ORAL
  Filled 2020-08-21 (×8): qty 1

## 2020-08-21 MED ORDER — ACETAMINOPHEN 325 MG PO TABS: 650.0000 mg | ORAL_TABLET | Freq: Four times a day (QID) | ORAL | Status: DC | PRN

## 2020-08-21 MED ORDER — SODIUM CHLORIDE 0.9 % IV SOLN
1.0000 g | Freq: Once | INTRAVENOUS | Status: AC
  Administered 2020-08-21: 1 g via INTRAVENOUS
  Filled 2020-08-21: qty 10

## 2020-08-21 MED ORDER — SODIUM CHLORIDE 0.9 % IV BOLUS
1000.0000 mL | Freq: Once | INTRAVENOUS | Status: AC
  Administered 2020-08-21: 1000 mL via INTRAVENOUS

## 2020-08-21 MED ORDER — ONDANSETRON HCL 4 MG/2ML IJ SOLN: 4.0000 mg | Freq: Four times a day (QID) | INTRAMUSCULAR | Status: DC | PRN

## 2020-08-21 MED ORDER — INSULIN ASPART 100 UNIT/ML ~~LOC~~ SOLN
0.0000 [IU] | Freq: Three times a day (TID) | SUBCUTANEOUS | Status: DC
  Administered 2020-08-21 – 2020-08-22 (×2): 1 [IU] via SUBCUTANEOUS
  Filled 2020-08-21 (×2): qty 1

## 2020-08-21 MED ORDER — MIRTAZAPINE 15 MG PO TABS
7.5000 mg | ORAL_TABLET | Freq: Every day | ORAL | Status: DC
  Administered 2020-08-21 – 2020-08-23 (×3): 7.5 mg via ORAL
  Filled 2020-08-21 (×3): qty 1

## 2020-08-21 MED ORDER — APIXABAN 2.5 MG PO TABS
2.5000 mg | ORAL_TABLET | Freq: Two times a day (BID) | ORAL | Status: DC
  Administered 2020-08-21 – 2020-08-24 (×5): 2.5 mg via ORAL
  Filled 2020-08-21 (×6): qty 1

## 2020-08-21 MED ORDER — ATENOLOL 25 MG PO TABS
25.0000 mg | ORAL_TABLET | Freq: Two times a day (BID) | ORAL | Status: DC
  Administered 2020-08-21 – 2020-08-24 (×7): 25 mg via ORAL
  Filled 2020-08-21 (×7): qty 1

## 2020-08-21 NOTE — Evaluation (Signed)
Physical Therapy Evaluation Patient Details [name] MRN: [id number] DOB: 11/15/1929 Today's Date: 08/21/2020   History of Present Illness  86 y.o. Caucasian female with a known history of multiple medical problems that are mentioned below including stage III chronic kidney disease, type diabetes mellitus, hypertension, dyslipidemia and depression, presented to the emergency room with acute onset of failure to thrive.  The patient stopped walking several days ago and has been staying in her recliner.  She is cared for by her disabled daughter who lately has not been able to care for her.  She has been having dysuria and urinary frequency urgency without hematuria or flank pain  Clinical Impression  Patient received in bed, anxious and fearful during evaluation. Daughter present. She is able to perform bed mobility with min+2 assist. Although stating "I can't" during mobility. She required min/mod assist +2 for sit to stand from edge of bed. She is unable to progress to ambulation at this time. She is limited due to cognition and weakness. She will continue to benefit from skilled PT while here to improve strength and functional independence.        Follow Up Recommendations SNF    Equipment Recommendations  None recommended by PT    Recommendations for Other Services       Precautions / Restrictions Precautions Precautions: Fall Restrictions Weight Bearing Restrictions: No      Mobility  Bed Mobility Overal bed mobility: Needs Assistance Bed Mobility: Supine to Sit;Sit to Supine;Rolling Rolling: Min assist;+2 for physical assistance   Supine to sit: Min assist;+2 for physical assistance Sit to supine: +2 for physical assistance;Min assist   General bed mobility comments: Pt needing hand over hand guidance to hold on to bed rails for technique.  Transfers Overall transfer level: Needs assistance Equipment used: 2 person hand held assist Transfers: Sit to/from  Stand Sit to Stand: Mod assist;+2 physical assistance         General transfer comment: patient fearful and anxious with mobility. States " I can't"  Ambulation/Gait             General Gait Details: patient unable to ambulate at this time due to fear/anxiety  Stairs            Wheelchair Mobility    Modified Rankin (Stroke Patients Only)       Balance Overall balance assessment: Needs assistance Sitting-balance support: Feet unsupported;Bilateral upper extremity supported Sitting balance-Leahy Scale: Fair Sitting balance - Comments: min- mod A on EOB with posterior bias abd then R lateral lean Postural control: Posterior lean;Right lateral lean Standing balance support: Bilateral upper extremity supported;During functional activity Standing balance-Leahy Scale: Poor Standing balance comment: +2 assist                             Pertinent Vitals/Pain Pain Assessment: Faces Faces Pain Scale: Hurts little more Pain Location: generalized Pain Descriptors / Indicators: Crying;Discomfort Pain Intervention(s): Limited activity within patient's tolerance;Monitored during session;Repositioned    Home Living Family/patient expects to be discharged to:: Skilled nursing facility Living Arrangements: Children Available Help at Discharge: Family;Available 24 hours/day Type of Home: House Home Access: Stairs to enter Entrance Stairs-Rails: Right Entrance Stairs-Number of Steps: 2 Home Layout: One level Home Equipment: Walker - 2 wheels Additional Comments: walk in shower with chair    Prior Function Level of Independence: Needs assistance   Gait / Transfers Assistance Needed: prior to the last month, patient was  able to ambulate household distances(to bathroom) with RW. Over the last month patient has been moving less and has stopped walking completely.  ADL's / Homemaking Assistance Needed: Daughter reports she assist pt with showering         Hand Dominance   Dominant Hand: Right    Extremity/Trunk Assessment   Upper Extremity Assessment Upper Extremity Assessment: Defer to OT evaluation    Lower Extremity Assessment Lower Extremity Assessment: Generalized weakness    Cervical / Trunk Assessment Cervical / Trunk Assessment: Kyphotic  Communication   Communication: HOH  Cognition Arousal/Alertness: Awake/alert Behavior During Therapy: Anxious Overall Cognitive Status: History of cognitive impairments - at baseline Area of Impairment: Orientation;Following commands;Safety/judgement;Awareness                 Orientation Level: Disoriented to;Place;Time;Situation     Following Commands: Follows one step commands with increased time Safety/Judgement: Decreased awareness of safety;Decreased awareness of deficits Awareness: Intellectual   General Comments: Pt looking at therapist and asking several times, " Whatr do you want me to do?" and becoming very tearful      General Comments      Exercises     Assessment/Plan    PT Assessment Patient needs continued PT services  PT Problem List Decreased strength;Decreased mobility;Decreased safety awareness;Decreased coordination;Decreased cognition;Decreased activity tolerance;Decreased balance       PT Treatment Interventions DME instruction;Therapeutic activities;Gait training;Therapeutic exercise;Patient/family education;Balance training;Functional mobility training;Stair training    PT Goals (Current goals can be found in the Care Plan section)  Acute Rehab PT Goals Patient Stated Goal: to go to rehab PT Goal Formulation: With family Time For Goal Achievement: 09/04/20 Potential to Achieve Goals: Good    Frequency Min 2X/week   Barriers to discharge Decreased caregiver support;Inaccessible home environment patient was living with her daughter, but her daughter is unable to take care of her    Co-evaluation PT/OT/SLP  Co-Evaluation/Treatment: Yes Reason for Co-Treatment: Necessary to address cognition/behavior during functional activity;For patient/therapist safety;To address functional/ADL transfers PT goals addressed during session: Mobility/safety with mobility;Balance OT goals addressed during session: ADL's and self-care;Other (comment) (functional mobility)       AM-PAC PT "6 Clicks" Mobility  Outcome Measure Help needed turning from your back to your side while in a flat bed without using bedrails?: A Lot Help needed moving from lying on your back to sitting on the side of a flat bed without using bedrails?: A Lot Help needed moving to and from a bed to a chair (including a wheelchair)?: Total Help needed standing up from a chair using your arms (e.g., wheelchair or bedside chair)?: A Lot Help needed to walk in hospital room?: Total Help needed climbing 3-5 steps with a railing? : Total 6 Click Score: 9    End of Session Equipment Utilized During Treatment: Gait belt Activity Tolerance: Other (comment) (patient limited by anxiety and fear) Patient left: in bed;with call bell/phone within reach;with bed alarm set;with family/visitor present Nurse Communication: Mobility status PT Visit Diagnosis: Muscle weakness (generalized) (M62.81);Other abnormalities of gait and mobility (R26.89);Difficulty in walking, not elsewhere classified (R26.2);Unsteadiness on feet (R26.81)    Time: 1740-8144 PT Time Calculation (min) (ACUTE ONLY): 16 min   Charges:   PT Evaluation $PT Eval Moderate Complexity: 1 Mod          Kage Willmann, PT, GCS 08/21/20,3:29 PM

## 2020-08-21 NOTE — ED Notes (Signed)
PT at bedside.

## 2020-08-21 NOTE — ED Notes (Signed)
This RN spoke with daughter whom pt resides with and is disabled herself. Per daughter [name], pt has had increase in weakness over last month and has become non-ambulatory for 2-3 weeks. Pt has been sitting in recliner, defecating on self and daughter has attempted to clean up pt but pt refuses. Daughter reports pt refused to go to MD appointments as well. Daughter updated on plan of care and the need for social work involvement. Daughter agrees that pt needs more hands on care than what she is able to provide.

## 2020-08-21 NOTE — Evaluation (Signed)
Occupational Therapy Evaluation Patient Details [name] MRN: [id number] DOB: November 25, 1929 Today's Date: 08/21/2020    History of Present Illness 86 y.o. Caucasian female with a known history of multiple medical problems that are mentioned below including stage III chronic kidney disease, type diabetes mellitus, hypertension, dyslipidemia and depression, presented to the emergency room with acute onset of failure to thrive.  The patient stopped walking several days ago and has been staying in her recliner.  She is cared for by her disabled daughter who lately has not been able to care for her.  She has been having dysuria and urinary frequency urgency without hematuria or flank pain   Clinical Impression   Patient presenting with decreased I in self care, balance, functional mobility/transfer, endurance, safety awareness, and cognitive deficits at baseline.  Patient was mod I with use of RW up until 4 weeks ago. He daughter would assist with IADL tasks and assist her safely into walk in shower. Pt has been needing increased assistance over the last ~ 3 weeks and her daughter is unable to help at this level of care. Pt very confused, oriented only to self, and tearful during session.  Patient currently functioning at min - +2 assist and level of care my vary depending on cognitive mentation at that time. Patient will benefit from acute OT to increase overall independence in the areas of ADLs, functional mobility, and safety awareness in order to safely discharge to next venue of care.    Follow Up Recommendations  SNF;Supervision/Assistance - 24 hour    Equipment Recommendations  Other (comment) (defer to next venue of care)    Recommendations for Other Services Other (comment) (none at this time)     Precautions / Restrictions Precautions Precautions: Fall      Mobility Bed Mobility Overal bed mobility: Needs Assistance Bed Mobility: Rolling;Supine to Sit;Sit to Supine Rolling:  Min assist;+2 for physical assistance   Supine to sit: +2 for physical assistance;Min assist;Mod assist Sit to supine: Mod assist;+2 for physical assistance;Min assist   General bed mobility comments: Pt needing hand over hand guidance to hold on to bed rails for technique.  Transfers Overall transfer level: Needs assistance Equipment used: 2 person hand held assist Transfers: Sit to/from Stand Sit to Stand: Min assist;Mod assist;+2 physical assistance         General transfer comment: from EOB    Balance Overall balance assessment: Needs assistance Sitting-balance support: Feet unsupported;Bilateral upper extremity supported Sitting balance-Leahy Scale: Fair Sitting balance - Comments: min- mod A on EOB with posterior bias abd then R lateral lean Postural control: Posterior lean;Right lateral lean Standing balance support: Bilateral upper extremity supported Standing balance-Leahy Scale: Zero Standing balance comment: +2 assist          ADL either performed or assessed with clinical judgement   ADL Overall ADL's : Needs assistance/impaired     Grooming: Wash/dry hands;Wash/dry face;Bed level;Set up;Cueing for sequencing   General ADL Comments: Pt washing face with set up A and replies with "thank you". OT anticipates pt to need mod A for UB self care and max - total A for LB. Pt's ability to participate will likely vary depending on pt's cognitive mentation at the time.     Vision Patient Visual Report: No change from baseline              Pertinent Vitals/Pain Pain Assessment: Faces Faces Pain Scale: Hurts even more Pain Location: generalized Pain Descriptors / Indicators: Crying;Discomfort Pain Intervention(s):  Limited activity within patient's tolerance;Monitored during session;Repositioned     Hand Dominance Right   Extremity/Trunk Assessment Upper Extremity Assessment Upper Extremity Assessment: Generalized weakness   Lower Extremity  Assessment Lower Extremity Assessment: Generalized weakness   Cervical / Trunk Assessment Cervical / Trunk Assessment: Kyphotic   Communication Communication Communication: HOH   Cognition Arousal/Alertness: Awake/alert Behavior During Therapy: Anxious Overall Cognitive Status: History of cognitive impairments - at baseline Area of Impairment: Orientation;Following commands;Safety/judgement;Awareness      Orientation Level: Disoriented to;Place;Time;Situation     Following Commands: Follows one step commands with increased time Safety/Judgement: Decreased awareness of safety;Decreased awareness of deficits Awareness: Intellectual   General Comments: Pt looking at therapist and asking several times, " Whatr do you want me to do?" and becoming very tearful              Home Living Family/patient expects to be discharged to:: Private residence Living Arrangements: Children Available Help at Discharge: Family;Available 24 hours/day Type of Home: House Home Access: Stairs to enter [employer] of Steps: 2 Entrance Stairs-Rails: Right Home Layout: One level     Bathroom Shower/Tub: Teacher, early years/pre: Standard Bathroom Accessibility: Yes   Home Equipment: Environmental consultant - 2 wheels   Additional Comments: walk in shower with chair      Prior Functioning/Environment Level of Independence: Needs assistance  Gait / Transfers Assistance Needed: ambulates household distances(to bathroom) with RW ADL's / Homemaking Assistance Needed: Daughter reports she assist pt with showering            OT Problem List: Decreased strength;Decreased coordination;Decreased cognition;Decreased activity tolerance;Decreased safety awareness;Decreased knowledge of use of DME or AE;Impaired balance (sitting and/or standing)      OT Treatment/Interventions: Self-care/ADL training;Therapeutic exercise;Therapeutic activities;Cognitive remediation/compensation;Energy  conservation;Patient/family education;DME and/or AE instruction;Balance training    OT Goals(Current goals can be found in the care plan section) Acute Rehab OT Goals Patient Stated Goal: to go to rehab OT Goal Formulation: With family Time For Goal Achievement: 09/04/20 Potential to Achieve Goals: Fair ADL Goals Pt Will Perform Lower Body Dressing: with min assist;sit to/from stand Pt Will Transfer to Toilet: with min assist;ambulating Pt Will Perform Toileting - Clothing Manipulation and hygiene: with min assist;sit to/from stand  OT Frequency: Min 1X/week   Barriers to D/C: Other (comment)  caregiver is unable to assist her at current level of care       Co-evaluation PT/OT/SLP Co-Evaluation/Treatment: Yes Reason for Co-Treatment: Complexity of the patient's impairments (multi-system involvement);Necessary to address cognition/behavior during functional activity;For patient/therapist safety;To address functional/ADL transfers   OT goals addressed during session: ADL's and self-care;Other (comment) (functional mobility)      AM-PAC OT "6 Clicks" Daily Activity     Outcome Measure Help from another person eating meals?: A Little Help from another person taking care of personal grooming?: A Little Help from another person toileting, which includes using toliet, bedpan, or urinal?: Total Help from another person bathing (including washing, rinsing, drying)?: A Lot Help from another person to put on and taking off regular upper body clothing?: A Little Help from another person to put on and taking off regular lower body clothing?: Total 6 Click Score: 13   End of Session Nurse Communication: Mobility status;Precautions  Activity Tolerance: Patient limited by fatigue;Other (comment) (cognition) Patient left: in bed;with call bell/phone within reach;with bed alarm set  OT Visit Diagnosis: History of falling (Z91.81);Muscle weakness (generalized) (M62.81)  Time:  8875-7972 OT Time Calculation (min): 16 min Charges:  OT General Charges $OT Visit: 1 Visit OT Evaluation $OT Eval Moderate Complexity: 1 [address] [place], OTR/L , [facility]IS ascom [phone]  08/21/20, 1:35 PM

## 2020-08-21 NOTE — ED Notes (Signed)
Admitting bedside.  Pt cleaned of incontinent urine episode by this RN.

## 2020-08-21 NOTE — ED Notes (Addendum)
Patient moved into hospital bed. Cleaned and new brief placed on patient after being incontinent of urine. Buttocks pressure dressing in place and clean at this time

## 2020-08-21 NOTE — ED Notes (Addendum)
Pt with strong urine smell taken into triage room 2 to be cleaned and changed. Pt with breakdown to the buttocks, bilateral axillaries, under bilateral breast, groin. Pressure breakdown at bilateral heels, elbows. Pts back is red breakdown noted tot he right side with live maggots. This RN removed approx. 15 maggots from pts posterior. Pt cleaned, barrier cream applied, pressure pad applied to sacral region, new brief applied as well as linens, chucks pads, socks and gown. Pt also present with multiple mats in hair.

## 2020-08-21 NOTE — ED Notes (Signed)
Pt able to use bedpan to urinate. Urine sample obtained and sent to lab.  Blood cultures obtained by Laurette Schimke, RN.

## 2020-08-21 NOTE — Progress Notes (Signed)
PROGRESS NOTE    Tracy Mcmahon  KKX:[id number] DOB: 07-25-1929 DOA: 08/21/2020 PCP: Tracy Kayser, MD   Brief Narrative: Taken from H&P Tracy Mcmahon  is a 86 y.o. Caucasian female with a known history of multiple medical problems including stage III chronic kidney disease, type diabetes mellitus, hypertension, dyslipidemia and depression, presented to the emergency room with failure to thrive.  The patient stopped walking several days ago and has been staying in her recliner.  She is cared for by her disabled daughter who lately has not been able to care for her.  She has been having dysuria and urinary frequency urgency without hematuria or flank pain. UA with pyuria, cultures pending, COVID-19 negative.  Chest x-ray with small pleural effusions and basilar atelectasis and central bronchiectasis. She was started on Rocephin and received 1 L of bolus.  Subjective: Patient with advanced dementia at baseline, not recognizing daughter stating that she is my mother.  Denies any other complaints.  Assessment & Plan:   Active Problems:   FTT (failure to thrive) in adult  Failure to thrive.  Multifactorial which include generalized weakness and inability to walk.  UTI might be contributory.  Patient has advanced dementia at baseline.  Disabled daughter is unable to provide care. -PT/OT evaluation. -Patient will need a memory unit placement.  Possible UTI.  Can be contributory to her current worsening weakness. UA with pyuria.  Blood cultures negative, urine cultures pending -Continue ceftriaxone for now-we will de-escalate once culture results are available.  Stage II small decubitus sacral ulcer. -DuoDERM was applied. -She'll need frequent postural changes.  Hypertension.  Blood pressure mildly elevated. -Continue home dose of amlodipine and Tenormin. -Continue to monitor  Type 2 diabetes.  Appears well controlled with A1c of 6. -Continue with SSI.  Objective: Vitals:   08/21/20 0600  08/21/20 0700 08/21/20 1200 08/21/20 1230  BP: (!) 98/40 (!) 140/53 (!) 145/55 (!) 115/46  Pulse: 76 77 79 74  Resp:    16  Temp:      TempSrc:      SpO2: 99% 98% 94% 94%   No intake or output data in the 24 hours ending 08/21/20 1521 There were no vitals filed for this visit.  Examination:  General exam: Frail elderly lady, appears calm and comfortable  Respiratory system: Clear to auscultation. Respiratory effort normal. Cardiovascular system: S1 & S2 heard, RRR. No JVD, murmurs, Gastrointestinal system: Soft, nontender, nondistended, bowel sounds positive. Central nervous system: Alert and oriented to self only, no focal neurological deficits. Extremities: No edema, no cyanosis, pulses intact and symmetrical. Psychiatry: Judgement and insight appear impaired.  DVT prophylaxis: Eliquis Code Status: Full Family Communication: Daughter was updated at bedside Disposition Plan:  Status is: Inpatient  Remains inpatient appropriate because:Inpatient level of care appropriate due to severity of illness   Dispo: The patient is from: Home              Anticipated d/c is to: SNF              Anticipated d/c date is: 2 days              Patient currently is not medically stable to d/c.   Consultants:   None  Procedures:  Antimicrobials:  Ceftriaxone  Data Reviewed: I have personally reviewed following labs and imaging studies  CBC: Recent Labs  Lab 08/20/20 2207 08/21/20 0450  WBC 7.2 6.4  HGB 10.6* 10.7*  HCT 34.8* 35.1*  MCV 89.9 90.5  PLT  213 992   Basic Metabolic Panel: Recent Labs  Lab 08/20/20 2207 08/21/20 0450  NA 139 142  K 4.1 3.9  CL 99 105  CO2 30 27  GLUCOSE 124* 111*  BUN 25* 22  CREATININE 1.17* 1.06*  CALCIUM 9.0 8.6*   GFR: CrCl cannot be calculated (Unknown ideal weight.). Liver Function Tests: Recent Labs  Lab 08/20/20 2207  AST 17  ALT 12  ALKPHOS 70  BILITOT 0.4  PROT 6.6  ALBUMIN 3.1*   Recent Labs  Lab 08/20/20 2207    LIPASE 25   No results for input(s): AMMONIA in the last 168 hours. Coagulation Profile: No results for input(s): INR, PROTIME in the last 168 hours. Cardiac Enzymes: No results for input(s): CKTOTAL, CKMB, CKMBINDEX, TROPONINI in the last 168 hours. BNP (last 3 results) No results for input(s): PROBNP in the last 8760 hours. HbA1C: Recent Labs    08/21/20 0450  HGBA1C 6.0*   CBG: Recent Labs  Lab 08/21/20 0822 08/21/20 1204  GLUCAP 116* 160*   Lipid Profile: No results for input(s): CHOL, HDL, LDLCALC, TRIG, CHOLHDL, LDLDIRECT in the last 72 hours. Thyroid Function Tests: No results for input(s): TSH, T4TOTAL, FREET4, T3FREE, THYROIDAB in the last 72 hours. Anemia Panel: No results for input(s): VITAMINB12, FOLATE, FERRITIN, TIBC, IRON, RETICCTPCT in the last 72 hours. Sepsis Labs: Recent Labs  Lab 08/20/20 2207 08/21/20 0705  LATICACIDVEN 1.2 0.8    Recent Results (from the past 240 hour(s))  Blood culture (routine x 2)     Status: None (Preliminary result)   Collection Time: 08/20/20 10:07 PM   Specimen: BLOOD  Result Value Ref Range Status   Specimen Description BLOOD RIGHT [facility]  Final   Special Requests   Final    BOTTLES DRAWN AEROBIC AND ANAEROBIC Blood Culture adequate volume   Culture   Final    NO GROWTH < 12 HOURS Performed at [facility], [address], [place], [place] [address]    Report Status PENDING  Incomplete  Culture, blood (Routine X 2) w Reflex to ID Panel     Status: None (Preliminary result)   Collection Time: 08/21/20  2:55 AM   Specimen: BLOOD  Result Value Ref Range Status   Specimen Description BLOOD LEFT ANTECUBITAL  Final   Special Requests   Final    BOTTLES DRAWN AEROBIC AND ANAEROBIC Blood Culture adequate volume   Culture   Final    NO GROWTH < 12 HOURS Performed at [facility], [address], [place], [place] [address]    Report Status PENDING  Incomplete  Respiratory Panel by RT PCR (Flu A&B,  Covid) - Nasopharyngeal Swab     Status: None   Collection Time: 08/21/20  4:50 AM   Specimen: Nasopharyngeal Swab  Result Value Ref Range Status   SARS Coronavirus 2 by RT PCR NEGATIVE NEGATIVE Final    Comment: (NOTE) SARS-CoV-2 target nucleic acids are NOT DETECTED.  The SARS-CoV-2 RNA is generally detectable in upper respiratoy specimens during the acute phase of infection. The lowest concentration of SARS-CoV-2 viral copies this assay can detect is 131 copies/mL. A negative result does not preclude SARS-Cov-2 infection and should not be used as the sole basis for treatment or other patient management decisions. A negative result may occur with  improper specimen collection/handling, submission of specimen other than nasopharyngeal swab, presence of viral mutation(s) within the areas targeted by this assay, and inadequate number of viral copies (<131 copies/mL). A negative  result must be combined with clinical observations, patient history, and epidemiological information. The expected result is Negative.  Fact Sheet for Patients:  PinkCheek.be  Fact Sheet for Healthcare Providers:  GravelBags.it  This test is no t yet approved or cleared by the [place] FDA and  has been authorized for detection and/or diagnosis of SARS-CoV-2 by FDA under an Emergency Use Authorization (EUA). This EUA will remain  in effect (meaning this test can be used) for the duration of the COVID-19 declaration under Section 564(b)(1) of the Act, 21 U.S.C. section 360bbb-3(b)(1), unless the authorization is terminated or revoked sooner.     Influenza A by PCR NEGATIVE NEGATIVE Final   Influenza B by PCR NEGATIVE NEGATIVE Final    Comment: (NOTE) The Xpert Xpress SARS-CoV-2/FLU/RSV assay is intended as an aid in  the diagnosis of influenza from Nasopharyngeal swab specimens and  should not be used as a sole basis for treatment. Nasal  washings and  aspirates are unacceptable for Xpert Xpress SARS-CoV-2/FLU/RSV  testing.  Fact Sheet for Patients: PinkCheek.be  Fact Sheet for Healthcare Providers: GravelBags.it  This test is not yet approved or cleared by the [place] FDA and  has been authorized for detection and/or diagnosis of SARS-CoV-2 by  FDA under an Emergency Use Authorization (EUA). This EUA will remain  in effect (meaning this test can be used) for the duration of the  Covid-19 declaration under Section 564(b)(1) of the Act, 21  U.S.C. section 360bbb-3(b)(1), unless the authorization is  terminated or revoked. Performed at [facility], [address], [place], [place] [address]      Radiology Studies: [facility] Chest [place] 1 View  Result Date: 08/21/2020 CLINICAL DATA:  Weakness and strong urine smell for 3 days. EXAM: PORTABLE CHEST 1 VIEW COMPARISON:  04/18/2019 FINDINGS: Mild cardiac enlargement. No vascular congestion or edema. Small left pleural effusion with basilar atelectasis. Central bronchiectasis bilaterally. No pneumothorax. Calcification of the aorta. Degenerative changes in the spine and shoulders. Surgical clips in the upper abdomen. IMPRESSION: Small left pleural effusion with basilar atelectasis. Central bronchiectasis. Electronically Signed   By: Lucienne Capers M.D.   On: 08/21/2020 02:23    Scheduled Meds: . amLODipine  2.5 mg Oral Daily  . aspirin EC  81 mg Oral Daily  . atenolol  25 mg Oral BID  . enoxaparin (LOVENOX) injection  40 mg Subcutaneous Q24H  . insulin aspart  0-9 Units Subcutaneous TID PC & HS  . midodrine  10 mg Oral TID WC  . mirtazapine  7.5 mg Oral QHS  . pravastatin  20 mg Oral QPM   Continuous Infusions: . sodium chloride 100 mL/hr at 08/21/20 1018  . cefTRIAXone (ROCEPHIN)  IV       LOS: 0 days   Time spent: 35 minutes  Lorella Nimrod, MD [facility]  If 7PM-7AM, please contact  night-coverage [email or web address]  08/21/2020, 3:21 PM   This record has been created using Systems analyst. Errors have been sought and corrected,but may not always be located. Such creation errors do not reflect on the standard of care.

## 2020-08-21 NOTE — ED Notes (Signed)
Pharmacy messaged to verify pt medications.  °

## 2020-08-21 NOTE — ED Notes (Signed)
Daughter [name] made aware by annie,RN of patients clothes being soiled in urine and stool and thrown away. Daughter reports understanding.

## 2020-08-21 NOTE — TOC Initial Note (Signed)
Transition of Care [facility]) - Initial/Assessment Note    Patient Details  [name] MRN: [id number] Date of Birth: 07-22-1929  [facility] CM/SW Contact:    Ova Freshwater Phone Number:  [phone] 08/21/2020, 12:28 PM  Clinical Narrative:                  CSW spoke with patient's daughter/caregiver [name] [phone] to update on TOC process and timeline for SNF placement.  Patient has fluctuation orientation and is only oriented to self.  Patient mistakes her daughter for her mother and CSW for family member.  Patient is unable to perform any ADLs without assistance and Ms. Grandville Silos stated she thinks patient needs to be placed in a long term care facility.  This CSW inquired about patient's monthly income, which is 2,000+ per month.  This CSW explained to Ms. Grandville Silos, the patient would not be able to qualify for medicaid for long term care placement after SNF placement due monthly income exceeding Medicaid income limit. Ms. Grandville Silos verbalized understanding.  Patient is pending PT/OT evals for SNF placement.      Expected Discharge Plan: Skilled Nursing Facility Barriers to Discharge: Continued Medical Work up   Patient Goals and CMS Choice Patient states their goals for this hospitalization and ongoing recovery are:: Rehab and then a long term care placement CMS Medicare.gov Compare Post Acute Care list provided to:: Patient Represenative (must comment) [name] (Daughter) [phone]) Choice offered to / list presented to : Adult Children  Expected Discharge Plan and Services Expected Discharge Plan: [place] In-house Referral: Clinical Social Work     Living arrangements for the past 2 months: [place]                                      Prior Living Arrangements/Services Living arrangements for the past 2 months: Single Family Home Lives with:: Adult Children Patient language and need for  interpreter reviewed:: Yes Do you feel safe going back to the place where you live?: No   Patient needs increased assistance with ADLs.  Need for Family Participation in Patient Care: Yes (Comment) Care giver support system in place?: Yes (comment)   Criminal Activity/Legal Involvement Pertinent to Current Situation/Hospitalization: No - Comment as needed  Activities of Daily Living      Permission Sought/Granted Permission sought to share information with : Family Supports    Share Information with NAME: [name] (Daughter) [phone]           Emotional Assessment Appearance:: Appears stated age Attitude/Demeanor/Rapport: Apprehensive, Inconsistent Affect (typically observed): Restless Orientation: : Oriented to Self Alcohol / Substance Use: Not Applicable Psych Involvement: No (comment)  Admission diagnosis:  FTT (failure to thrive) in adult [R62.7] Patient Active Problem List   Diagnosis Date Noted  . FTT (failure to thrive) in adult 08/21/2020  . Recurrent UTI 10/20/2018  . Pressure injury of skin 08/15/2018  . Weakness 06/26/2018  . Hypoglycemia 04/30/2018  . UTI (urinary tract infection) 03/15/2018  . Acute encephalopathy 12/19/2016  . Generalized weakness 12/19/2016  . Acute on chronic respiratory failure with hypoxia and hypercapnia ([place]) 12/16/2016  . COPD exacerbation ([place]) 12/16/2016  . Community acquired pneumonia 12/16/2016  . Lactic acidosis 12/16/2016  . Sepsis ([place]) 12/16/2016  . PE (pulmonary embolism) 08/11/2016  . COPD (chronic obstructive pulmonary disease) ([place]) 03/29/2015  . Hypertension 03/29/2015  .  Diabetes ([place]) 03/29/2015  . Hyperlipidemia 03/29/2015  . Depression 03/29/2015  . Restless leg syndrome 03/29/2015  . DVT (deep venous thrombosis) ([place]) 03/29/2015  . CKD (chronic kidney disease), stage III ([place]) 03/29/2015   PCP:  [name], MD Pharmacy:   [id number] - [place], [place] [address] Phone: [phone] Fax: [phone]     Social Determinants of Health (SDOH) Interventions    Readmission Risk Interventions No flowsheet data found.

## 2020-08-21 NOTE — ED Provider Notes (Signed)
[facility] Emergency Department Provider Note   ____________________________________________   First MD Initiated Contact with Patient 08/21/20 0134     (approximate)  I have reviewed the triage vital signs and the nursing notes.   HISTORY  Chief Complaint Dysuria  Level of V caveat: Limited by very hard of hearing  HPI Tracy Mcmahon is a 86 y.o. female brought to the ED via EMS from home with a chief complaint of weakness and smell of strong urine.  Symptoms x3 days.  Unable to stand.  Patient reportedly lives with family but EMS reports patient was sitting in a recliner soaked with her urine amist poor living conditions.  Family reportedly unable to take care of patient.  Patient does not know why she is here.     Past Medical History:  Diagnosis Date  . Cancer ([place])   . Cataract   . Chronic kidney disease (CKD), stage III (moderate)   . Chronic urticaria   . COPD (chronic obstructive pulmonary disease) ([place])   . Depression   . Diabetes mellitus without complication ([place])   . DVT (deep venous thrombosis) ([facility])    left leg  . Hyperlipemia   . Hypertension   . Osteoarthritis   . Osteopenia   . Polymyalgia rheumatica ([place])   . Polyneuropathy   . Renal cancer ([place])   . Renal insufficiency 08/10/2016   Chart indicates CKD stage 3  . Restless leg syndrome   . Vitamin D deficiency   . Vulvar intraepithelial neoplasia with lichen sclerosus     Patient Active Problem List   Diagnosis Date Noted  . Recurrent UTI 10/20/2018  . Pressure injury of skin 08/15/2018  . Weakness 06/26/2018  . Hypoglycemia 04/30/2018  . UTI (urinary tract infection) 03/15/2018  . Acute encephalopathy 12/19/2016  . Generalized weakness 12/19/2016  . Acute on chronic respiratory failure with hypoxia and hypercapnia ([place]) 12/16/2016  . COPD exacerbation ([place]) 12/16/2016  . Community acquired pneumonia 12/16/2016  . Lactic acidosis 12/16/2016  . Sepsis ([place])  12/16/2016  . PE (pulmonary embolism) 08/11/2016  . COPD (chronic obstructive pulmonary disease) ([place]) 03/29/2015  . Hypertension 03/29/2015  . Diabetes ([place]) 03/29/2015  . Hyperlipidemia 03/29/2015  . Depression 03/29/2015  . Restless leg syndrome 03/29/2015  . DVT (deep venous thrombosis) ([place]) 03/29/2015  . CKD (chronic kidney disease), stage III ([place]) 03/29/2015    Past Surgical History:  Procedure Laterality Date  . ABDOMINAL HYSTERECTOMY    . APPENDECTOMY    . CATARACT EXTRACTION    . COLONOSCOPY    . JOINT REPLACEMENT Bilateral    hip  . LAMINOTOMY    . NEPHRECTOMY    . TEMPORAL ARTERY BIOPSY / LIGATION    . TONSILLECTOMY      Prior to Admission medications   Medication Sig Start Date End Date Taking? Authorizing Provider  amLODipine (NORVASC) 5 MG tablet Take 1 tablet (5 mg total) by mouth daily. Patient taking differently: Take 2.5 mg by mouth daily.  05/03/18  Yes Gladstone Lighter, MD  aspirin EC 81 MG tablet Take 81 mg by mouth daily.   Yes [provider]  atenolol (TENORMIN) 25 MG tablet Take 25 mg by mouth 2 (two) times daily.   Yes [provider]  ELIQUIS 2.5 MG TABS tablet Take 2.5 mg by mouth 2 (two) times daily. 06/30/20  Yes [provider]  gabapentin (NEURONTIN) 300 MG capsule Take 1 capsule (300MG ) by mouth every morning, 2 capsule (300MG ) every  evening and 2 capsules (900MG ) at bedtime   Yes [provider]  mirtazapine (REMERON) 7.5 MG tablet Take 7.5 mg by mouth at bedtime. 05/14/20  Yes [provider]  pravastatin (PRAVACHOL) 20 MG tablet Take 20 mg by mouth every evening.    Yes [provider]  Vitamin D, Ergocalciferol, (DRISDOL) 50000 UNITS CAPS capsule Take 50,000 Units by mouth every Sunday.    Yes [provider]  albuterol (PROVENTIL) (2.5 MG/3ML) 0.083% nebulizer solution Take 3 mLs (2.5 mg total) by nebulization every 4 (four) hours. Patient not taking: Reported on 08/21/2020 12/19/16    Theodoro Grist, MD  Calcium Carb-Cholecalciferol (CALCIUM 600/VITAMIN D3) 600-800 MG-UNIT TABS Take 600 mg by mouth daily. Patient not taking: Reported on 08/21/2020    [provider]  conjugated estrogens (PREMARIN) vaginal cream Place 1 Applicatorful vaginally daily. Use pea sized amount M-W-Fr before bedtime Patient not taking: Reported on 08/21/2020 10/02/18   Hollice Espy, MD  diphenhydramine-acetaminophen (TYLENOL PM) 25-500 MG TABS tablet Take 1 tablet by mouth at bedtime as needed (sleep or pain). Patient not taking: Reported on 08/21/2020    [provider]  diphenhydrAMINE-zinc acetate (BENADRYL EXTRA STRENGTH) cream Apply 1 application topically 3 (three) times daily as needed for itching. Patient not taking: Reported on 08/21/2020 04/18/19   Lannie Fields, PA-C  FLUoxetine (PROZAC) 20 MG capsule Take 20 mg by mouth daily. Patient not taking: Reported on 08/21/2020    [provider]  furosemide (LASIX) 20 MG tablet Take 20 mg by mouth daily. Patient not taking: Reported on 08/21/2020    [provider]  insulin aspart (NOVOLOG) 100 UNIT/ML injection Inject 0-9 Units into the skin 3 (three) times daily with meals. CBG 70 - 120: 0 units CBG 121 - 150: 1 unit CBG 151 - 200: 2 units CBG 201 - 250: 3 units CBG 251 - 300: 5 units CBG 301 - 350: 7 units CBG 351 - 400: 9 units CBG > 400: call MD Patient not taking: Reported on 08/21/2020 08/16/18   Gladstone Lighter, MD  insulin glargine (LANTUS) 100 UNIT/ML injection Inject 0.25 mLs (25 Units total) into the skin daily. Patient not taking: Reported on 08/21/2020 10/21/18   Bettey Costa, MD  Multiple Minerals-Vitamins (GNP CAL MAG ZINC +D3 PO) Take 1 tablet by mouth daily. Patient not taking: Reported on 08/21/2020    [provider]  predniSONE (DELTASONE) 5 MG tablet Take 7.5 mg by mouth daily.  Patient not taking: Reported on 08/21/2020    [provider]  vitamin B-12  (CYANOCOBALAMIN) 1000 MCG tablet Take 1,000 mcg by mouth daily. Patient not taking: Reported on 08/21/2020    [provider]    Allergies Fosamax [alendronate sodium] and Wellbutrin [bupropion]  Family History  Problem Relation Age of Onset  . Hypertension Other   . Diabetes Other     Social History Social History   Tobacco Use  . Smoking status: Never Smoker  . Smokeless tobacco: Never Used  Vaping Use  . Vaping Use: Never used  Substance Use Topics  . Alcohol use: No    Alcohol/week: 0.0 standard drinks  . Drug use: No    Review of Systems  Constitutional: Positive for generalized weakness.  No fever/chills Eyes: No visual changes. ENT: No sore throat. Cardiovascular: Denies chest pain. Respiratory: Denies shortness of breath. Gastrointestinal: No abdominal pain.  No nausea, no vomiting.  No diarrhea.  No constipation. Genitourinary: Positive for strong smell of urine.  Negative  for dysuria. Musculoskeletal: Negative for back pain. Skin: Negative for rash. Neurological: Negative for headaches, focal weakness or numbness.   ____________________________________________   PHYSICAL EXAM:  VITAL SIGNS: ED Triage Vitals [08/20/20 2202]  Enc Vitals Group     BP (!) 132/54     Pulse Rate 62     Resp 17     Temp 99.6 F (37.6 C)     Temp Source Rectal     SpO2 98 %     Weight      Height      Head Circumference      Peak Flow      Pain Score      Pain Loc      Pain Edu?      Excl. in [place]?     Constitutional: Alert and oriented.  Dirty appearing and in no acute distress. Eyes: Conjunctivae are normal. PERRL. EOMI. Head: Atraumatic. Nose: No congestion/rhinnorhea. Mouth/Throat: Mucous membranes are mildly dry.   Neck: No stridor.   Cardiovascular: Normal rate, regular rhythm. Grossly normal heart sounds.  Good peripheral circulation. Respiratory: Normal respiratory effort.  No retractions. Lungs CTAB. Gastrointestinal: Soft and nontender. No  distention. No abdominal bruits. No CVA tenderness. Musculoskeletal: No lower extremity tenderness nor edema.  No joint effusions.  Stool beneath fingernails and toenails. Neurologic:  Normal speech and language. No gross focal neurologic deficits are appreciated. No gait instability. Skin:  Skin is warm, dry and intact. No rash noted.  Stage II sacral decubitus ulcer.  Intertriginous rash beneath both breasts. Psychiatric: Mood and affect are normal. Speech and behavior are normal.  ____________________________________________   LABS (all labs ordered are listed, but only abnormal results are displayed)  Labs Reviewed  COMPREHENSIVE METABOLIC PANEL - Abnormal; Notable for the following components:      Result Value   Glucose, Bld 124 (*)    BUN 25 (*)    Creatinine, Ser 1.17 (*)    Albumin 3.1 (*)    GFR calc non Af Amer 41 (*)    GFR calc Af Amer 48 (*)    All other components within normal limits  CBC - Abnormal; Notable for the following components:   Hemoglobin 10.6 (*)    HCT 34.8 (*)    RDW 15.8 (*)    All other components within normal limits  URINALYSIS, COMPLETE (UACMP) WITH MICROSCOPIC - Abnormal; Notable for the following components:   Color, Urine YELLOW (*)    APPearance CLOUDY (*)    Hgb urine dipstick SMALL (*)    Leukocytes,Ua MODERATE (*)    Bacteria, UA MANY (*)    All other components within normal limits  URINE CULTURE  CULTURE, BLOOD (ROUTINE X 2)  RESPIRATORY PANEL BY RT PCR (FLU A&B, COVID)  CULTURE, BLOOD (ROUTINE X 2) W REFLEX TO ID PANEL  LIPASE, BLOOD  LACTIC ACID, PLASMA  TROPONIN I (HIGH SENSITIVITY)   ____________________________________________  EKG  ED ECG REPORT I, Jasmaine Rochel J, the attending physician, personally viewed and interpreted this ECG.   Date: 08/21/2020  EKG Time: 2201  Rate: 66  Rhythm: normal EKG, normal sinus rhythm  Axis: LAD  Intervals:left bundle branch block  ST&T Change:  Nonspecific  ____________________________________________  RADIOLOGY  ED MD interpretation: Small left pleural effusion  Official radiology report(s): DG Chest Port 1 View  Result Date: 08/21/2020 CLINICAL DATA:  Weakness and strong urine smell for 3 days. EXAM: PORTABLE CHEST 1 VIEW COMPARISON:  04/18/2019 FINDINGS: Mild cardiac enlargement. No vascular  congestion or edema. Small left pleural effusion with basilar atelectasis. Central bronchiectasis bilaterally. No pneumothorax. Calcification of the aorta. Degenerative changes in the spine and shoulders. Surgical clips in the upper abdomen. IMPRESSION: Small left pleural effusion with basilar atelectasis. Central bronchiectasis. Electronically Signed   By: Lucienne Capers M.D.   On: 08/21/2020 02:23    ____________________________________________   PROCEDURES  Procedure(s) performed (including Critical Care):  .1-3 Lead EKG Interpretation Performed by: Paulette Blanch, MD Authorized by: Paulette Blanch, MD     Interpretation: normal     ECG rate:  65   ECG rate assessment: normal     Rhythm: sinus rhythm     Ectopy: none     Conduction: normal   Comments:     Patient placed on cardiac monitor to evaluate for arrhythmias     ____________________________________________   INITIAL IMPRESSION / ASSESSMENT AND PLAN / ED COURSE  As part of my medical decision making, I reviewed the following data within the [facility] notes reviewed and incorporated, Labs reviewed, EKG interpreted, Old chart reviewed, Radiograph reviewed and Notes from prior ED visits        86 year old female sent to the ED for generalized weakness and possible UTI.  Differential diagnosis includes but is not limited to infectious, metabolic etiologies, failure to thrive, elder neglect, etc.  Nursing staff tells me there were maggots in patient's sacral decubitus ulcer which was cleaned out and dressing placed.  Barrier cream applied  beneath both breasts which were red and erythematous.  Work-up thus far unremarkable.  Will check troponin, UA, chest x-ray, respiratory panel.  If patient does not meet hospitalization criteria, will consult social work for placement.  Clinical Course as of Aug 22 323  Mon Aug 21, 2020  0205 UA noted.  Will order IV Rocephin.  Given patient's deconditioned state to the point where she cannot even stand, coupled with UTI and sacral decubitus ulcer, will discuss with hospitalist services for admission   [JS]    Clinical Course User Index [JS] Paulette Blanch, MD     ____________________________________________   FINAL CLINICAL IMPRESSION(S) / ED DIAGNOSES  Final diagnoses:  Weakness generalized  Lower urinary tract infectious disease  Sacral decubitus ulcer, stage II [facility])     ED Discharge Orders    None      *Please note:  Tracy Mcmahon was evaluated in Emergency Department on 08/21/2020 for the symptoms described in the history of present illness. She was evaluated in the context of the global COVID-19 pandemic, which necessitated consideration that the patient might be at risk for infection with the SARS-CoV-2 virus that causes COVID-19. Institutional protocols and algorithms that pertain to the evaluation of patients at risk for COVID-19 are in a state of rapid change based on information released by regulatory bodies including the CDC and federal and state organizations. These policies and algorithms were followed during the patient's care in the ED.  Some ED evaluations and interventions may be delayed as a result of limited staffing during and the pandemic.*   Note:  This document was prepared using Dragon voice recognition software and may include unintentional dictation errors.   Paulette Blanch, MD 08/21/20 [phone]

## 2020-08-21 NOTE — H&P (Signed)
[place]   PATIENT [name]    MR#:  [id number]  DATE OF BIRTH:  05-09-1929  DATE OF ADMISSION:  08/21/2020  PRIMARY CARE PHYSICIAN: [name], MD   REQUESTING/REFERRING PHYSICIAN: Lurline Hare, MD  CHIEF COMPLAINT:   Chief Complaint  Patient presents with  . Dysuria    HISTORY OF PRESENT ILLNESS:  Tracy Mcmahon  is a 86 y.o. Caucasian female with a known history of multiple medical problems that are mentioned below including stage III chronic kidney disease, type diabetes mellitus, hypertension, dyslipidemia and depression, presented to the emergency room with acute onset of failure to thrive.  The patient stopped walking several days ago and has been staying in her recliner.  She is cared for by her disabled daughter who lately has not been able to care for her.  She has been having dysuria and urinary frequency urgency without hematuria or flank pain.  She denied any cough or wheezing or dyspnea.  No diarrhea or constipation.  No chest pain or palpitations.  Upon presentation to the emergency room, temperature was 99.6, blood pressure was 132/54 and later 98/48 with otherwise normal vital signs.  Later dropped to 74/55 with a MAP of 62.  Labs showed a CMP with a creatinine of 1.15 and BUN 25 and anemia on her CBC.  Lactic acid was 1.2.  UA showed 11-20 WBCs with many bacteria blood cultures and urine culture were drawn. Chest x-ray showed small left pleural effusion with basilar atelectasis and central bronchiectasis.  The patient was given a gram of IV Rocephin and 1 L bolus of IV normal saline.  She will be admitted to a medically monitored bed with improvement of her blood pressure for further evaluation and management. PAST MEDICAL HISTORY:   Past Medical History:  Diagnosis Date  . Cancer ([place])   . Cataract   . Chronic kidney disease (CKD), stage III (moderate)   . Chronic urticaria   . COPD (chronic obstructive pulmonary disease) ([place])   . Depression   .  Diabetes mellitus without complication ([place])   . DVT (deep venous thrombosis) ([facility])    left leg  . Hyperlipemia   . Hypertension   . Osteoarthritis   . Osteopenia   . Polymyalgia rheumatica ([place])   . Polyneuropathy   . Renal cancer ([place])   . Renal insufficiency 08/10/2016   Chart indicates CKD stage 3  . Restless leg syndrome   . Vitamin D deficiency   . Vulvar intraepithelial neoplasia with lichen sclerosus     PAST SURGICAL HISTORY:   Past Surgical History:  Procedure Laterality Date  . ABDOMINAL HYSTERECTOMY    . APPENDECTOMY    . CATARACT EXTRACTION    . COLONOSCOPY    . JOINT REPLACEMENT Bilateral    hip  . LAMINOTOMY    . NEPHRECTOMY    . TEMPORAL ARTERY BIOPSY / LIGATION    . TONSILLECTOMY      SOCIAL HISTORY:   Social History   Tobacco Use  . Smoking status: Never Smoker  . Smokeless tobacco: Never Used  Substance Use Topics  . Alcohol use: No    Alcohol/week: 0.0 standard drinks    FAMILY HISTORY:   Family History  Problem Relation Age of Onset  . Hypertension Other   . Diabetes Other     DRUG ALLERGIES:   Allergies  Allergen Reactions  . Fosamax [Alendronate Sodium] Other (See Comments)    Reaction:  Made pt "very  sick"  . Wellbutrin [Bupropion] Other (See Comments)    Reaction:  Unknown    REVIEW OF SYSTEMS:   ROS As per history of present illness. All pertinent systems were reviewed above. Constitutional, HEENT, cardiovascular, respiratory, GI, GU, musculoskeletal, neuro, psychiatric, endocrine, integumentary and hematologic systems were reviewed and are otherwise negative/unremarkable except for positive findings mentioned above in the HPI.   MEDICATIONS AT HOME:   Prior to Admission medications   Medication Sig Start Date End Date Taking? Authorizing Provider  amLODipine (NORVASC) 5 MG tablet Take 1 tablet (5 mg total) by mouth daily. Patient taking differently: Take 2.5 mg by mouth daily.  05/03/18  Yes Gladstone Lighter, MD    aspirin EC 81 MG tablet Take 81 mg by mouth daily.   Yes [provider]  atenolol (TENORMIN) 25 MG tablet Take 25 mg by mouth 2 (two) times daily.   Yes [provider]  ELIQUIS 2.5 MG TABS tablet Take 2.5 mg by mouth 2 (two) times daily. 06/30/20  Yes [provider]  gabapentin (NEURONTIN) 300 MG capsule Take 1 capsule (300MG ) by mouth every morning, 2 capsule (300MG ) every evening and 2 capsules (900MG ) at bedtime   Yes [provider]  mirtazapine (REMERON) 7.5 MG tablet Take 7.5 mg by mouth at bedtime. 05/14/20  Yes [provider]  pravastatin (PRAVACHOL) 20 MG tablet Take 20 mg by mouth every evening.    Yes [provider]  Vitamin D, Ergocalciferol, (DRISDOL) 50000 UNITS CAPS capsule Take 50,000 Units by mouth every Sunday.    Yes [provider]  albuterol (PROVENTIL) (2.5 MG/3ML) 0.083% nebulizer solution Take 3 mLs (2.5 mg total) by nebulization every 4 (four) hours. Patient not taking: Reported on 08/21/2020 12/19/16   Theodoro Grist, MD  Calcium Carb-Cholecalciferol (CALCIUM 600/VITAMIN D3) 600-800 MG-UNIT TABS Take 600 mg by mouth daily. Patient not taking: Reported on 08/21/2020    [provider]  conjugated estrogens (PREMARIN) vaginal cream Place 1 Applicatorful vaginally daily. Use pea sized amount M-W-Fr before bedtime Patient not taking: Reported on 08/21/2020 10/02/18   Hollice Espy, MD  diphenhydramine-acetaminophen (TYLENOL PM) 25-500 MG TABS tablet Take 1 tablet by mouth at bedtime as needed (sleep or pain). Patient not taking: Reported on 08/21/2020    [provider]  diphenhydrAMINE-zinc acetate (BENADRYL EXTRA STRENGTH) cream Apply 1 application topically 3 (three) times daily as needed for itching. Patient not taking: Reported on 08/21/2020 04/18/19   Lannie Fields, PA-C  FLUoxetine (PROZAC) 20 MG capsule Take 20 mg by mouth daily. Patient not taking: Reported on 08/21/2020    [provider]  furosemide (LASIX) 20 MG tablet Take 20 mg by mouth daily. Patient not taking: Reported on 08/21/2020    [provider]  insulin aspart (NOVOLOG) 100 UNIT/ML injection Inject 0-9 Units into the skin 3 (three) times daily with meals. CBG 70 - 120: 0 units CBG 121 - 150: 1 unit CBG 151 - 200: 2 units CBG 201 - 250: 3 units CBG 251 - 300: 5 units CBG 301 - 350: 7 units CBG 351 - 400: 9 units CBG > 400: call MD Patient not taking: Reported on 08/21/2020 08/16/18   Gladstone Lighter, MD  insulin glargine (LANTUS) 100 UNIT/ML injection Inject 0.25 mLs (25 Units total) into the skin daily. Patient not taking: Reported on 08/21/2020 10/21/18   Bettey Costa, MD  Multiple Minerals-Vitamins (GNP CAL MAG ZINC +D3 PO) Take 1 tablet by mouth daily. Patient not taking: Reported  on 08/21/2020    [provider]  predniSONE (DELTASONE) 5 MG tablet Take 7.5 mg by mouth daily.  Patient not taking: Reported on 08/21/2020    [provider]  vitamin B-12 (CYANOCOBALAMIN) 1000 MCG tablet Take 1,000 mcg by mouth daily. Patient not taking: Reported on 08/21/2020    [provider]      VITAL SIGNS:  Blood pressure 121/73, pulse 77, temperature 99.6 F (37.6 C), temperature source Rectal, resp. rate 16, SpO2 100 %.  PHYSICAL EXAMINATION:  Physical Exam  GENERAL:  86 y.o.-year-old Caucasian female patient lying in the bed with no acute distress.  EYES: Pupils equal, round, reactive to light and accommodation. No scleral icterus. Extraocular muscles intact.  HEENT: Head atraumatic, normocephalic. Oropharynx and nasopharynx clear.  NECK:  Supple, no jugular venous distention. No thyroid enlargement, no tenderness.  LUNGS: Normal breath sounds bilaterally, no wheezing, rales,rhonchi or crepitation. No use of accessory muscles of respiration.  CARDIOVASCULAR: Regular rate and rhythm, S1, S2 normal. No murmurs, rubs, or gallops.  ABDOMEN: Soft, nondistended,  nontender. Bowel sounds present. No organomegaly or mass.  EXTREMITIES: No pedal edema, cyanosis, or clubbing.  NEUROLOGIC: Cranial nerves II through XII are intact. Muscle strength 5/5 in all extremities. Sensation intact. Gait not checked.  PSYCHIATRIC: The patient is alert and oriented x 3.  Normal affect and good eye contact. SKIN: Small clean sacral stage II decubitus ulcer LABORATORY PANEL:   CBC Recent Labs  Lab 08/20/20 2207  WBC 7.2  HGB 10.6*  HCT 34.8*  PLT 213   ------------------------------------------------------------------------------------------------------------------  Chemistries  Recent Labs  Lab 08/20/20 2207  NA 139  K 4.1  CL 99  CO2 30  GLUCOSE 124*  BUN 25*  CREATININE 1.17*  CALCIUM 9.0  AST 17  ALT 12  ALKPHOS 70  BILITOT 0.4   ------------------------------------------------------------------------------------------------------------------  Cardiac Enzymes No results for input(s): TROPONINI in the last 168 hours. ------------------------------------------------------------------------------------------------------------------  RADIOLOGY:  DG Chest Port 1 View  Result Date: 08/21/2020 CLINICAL DATA:  Weakness and strong urine smell for 3 days. EXAM: PORTABLE CHEST 1 VIEW COMPARISON:  04/18/2019 FINDINGS: Mild cardiac enlargement. No vascular congestion or edema. Small left pleural effusion with basilar atelectasis. Central bronchiectasis bilaterally. No pneumothorax. Calcification of the aorta. Degenerative changes in the spine and shoulders. Surgical clips in the upper abdomen. IMPRESSION: Small left pleural effusion with basilar atelectasis. Central bronchiectasis. Electronically Signed   By: Lucienne Capers M.D.   On: 08/21/2020 02:23      IMPRESSION AND PLAN:   1.  Failure to thrive, likely multifactorial including UTI and generalized debility and inability to walk. -The patient will be admitted to a medically monitored bed. -We  will continue antibiotic therapy with IV Rocephin. -We will follow urine and blood cultures. -Physical therapy consult to be obtained. -The patient was initially tachycardic however with no other criteria for sepsis. -She became hypotensive, will be given fluid boluses. -Case management consult will be obtained for consideration of placement in SNF post discharge.  2.  Stage II small decubitus sacral ulcer -We will apply DuoDERM as being done.  3.  Hypotension. -We will aggressively hydrate with IV normal saline. -We will follow blood pressure.  4.  DVT prophylaxis. -Subcutaneous Lovenox   All the records are reviewed and case discussed with ED provider. The plan of care was discussed in details with the patient (and family). I answered all questions. The patient agreed to proceed with the above mentioned plan. Further management will depend  upon hospital course.   CODE STATUS: Full code  Status is: Inpatient  Remains inpatient appropriate because:Altered mental status, Ongoing diagnostic testing needed not appropriate for outpatient work up, Unsafe d/c plan, IV treatments appropriate due to intensity of illness or inability to take PO and Inpatient level of care appropriate due to severity of illness   Dispo: The patient is from: Home              Anticipated d/c is to: SNF              Anticipated d/c date is: 3 days              Patient currently is not medically stable to d/c.    TOTAL TIME TAKING CARE OF THIS PATIENT: 55 minutes.    Christel Mormon M.D on 08/21/2020 at 3:43 AM  [facility]   From 7 PM-7 AM, contact night-coverage [email or web address]  CC: Primary care physician; [name], MD

## 2020-08-22 DIAGNOSIS — L89152 Pressure ulcer of sacral region, stage 2: Secondary | ICD-10-CM

## 2020-08-22 LAB — GLUCOSE, CAPILLARY
Glucose-Capillary: 100 mg/dL — ABNORMAL HIGH (ref 70–99)
Glucose-Capillary: 97 mg/dL (ref 70–99)
Glucose-Capillary: 91 mg/dL (ref 70–99)
Glucose-Capillary: 160 mg/dL — ABNORMAL HIGH (ref 70–99)

## 2020-08-22 NOTE — NC FL2 (Signed)
[place] LEVEL OF CARE SCREENING TOOL     IDENTIFICATION  Patient [name] Birthdate: 02-05-1929 Sex: female Admission Date (Current Location): 08/21/2020  [facility] and [place] Number:  Engineering geologist and Address:         Provider Number: [id number]  Attending Physician Name and Address:  Lorella Nimrod, MD  Relative Name and Phone Number:  [name] [phone]    Current Level of Care: SNF Recommended Level of Care: [place] Prior Approval Number:    Date Approved/Denied:   PASRR Number: [id number]  Discharge Plan: SNF    Current Diagnoses: Patient Active Problem List   Diagnosis Date Noted  . FTT (failure to thrive) in adult 08/21/2020  . Recurrent UTI 10/20/2018  . Pressure injury of skin 08/15/2018  . Weakness 06/26/2018  . Hypoglycemia 04/30/2018  . UTI (urinary tract infection) 03/15/2018  . Acute encephalopathy 12/19/2016  . Generalized weakness 12/19/2016  . Acute on chronic respiratory failure with hypoxia and hypercapnia ([place]) 12/16/2016  . COPD exacerbation ([place]) 12/16/2016  . Community acquired pneumonia 12/16/2016  . Lactic acidosis 12/16/2016  . Sepsis ([place]) 12/16/2016  . PE (pulmonary embolism) 08/11/2016  . COPD (chronic obstructive pulmonary disease) ([place]) 03/29/2015  . Hypertension 03/29/2015  . Diabetes ([place]) 03/29/2015  . Hyperlipidemia 03/29/2015  . Depression 03/29/2015  . Restless leg syndrome 03/29/2015  . DVT (deep venous thrombosis) ([place]) 03/29/2015  . CKD (chronic kidney disease), stage III ([place]) 03/29/2015    Orientation RESPIRATION BLADDER Height & Weight     Time, Self  Normal Incontinent Weight: 55.5 kg Height:     BEHAVIORAL SYMPTOMS/MOOD NEUROLOGICAL BOWEL NUTRITION STATUS      Continent Diet (Heart Healthy, thin liquids)  AMBULATORY STATUS COMMUNICATION OF NEEDS Skin   Extensive Assist Verbally Normal                       Personal Care Assistance Level of  Assistance  Bathing, Feeding, Dressing Bathing Assistance: Limited assistance Feeding assistance: Limited assistance Dressing Assistance: Limited assistance     Functional Limitations Info  Sight, Hearing, Speech Sight Info: Adequate Hearing Info: Impaired (Hard of Hearing) Speech Info: Adequate    SPECIAL CARE FACTORS FREQUENCY  PT (By licensed PT), OT (By licensed OT)                    Contractures Contractures Info: Not present    Additional Factors Info  Code Status, Allergies Code Status Info: Full Allergies Info: Fosamax, Wellbutrin           Current Medications (08/22/2020):  This is the current hospital active medication list Current Facility-Administered Medications  Medication Dose Route Frequency Provider Last Rate Last Admin  . 0.9 %  sodium chloride infusion   Intravenous Continuous Mansy, Arvella Merles, MD 100 mL/hr at 08/22/20 0504 New Bag at 08/22/20 0504  . acetaminophen (TYLENOL) tablet 650 mg  650 mg Oral Q6H PRN Mansy, Jan A, MD       Or  . acetaminophen (TYLENOL) suppository 650 mg  650 mg Rectal Q6H PRN Mansy, Jan A, MD      . amLODipine (NORVASC) tablet 2.5 mg  2.5 mg Oral Daily Mansy, Jan A, MD   2.5 mg at 08/21/20 1019  . apixaban (ELIQUIS) tablet 2.5 mg  2.5 mg Oral BID Lorella Nimrod, MD   2.5 mg at 08/21/20 2210  . aspirin EC tablet 81 mg  81 mg Oral Daily  Mansy, Jan A, MD   81 mg at 08/21/20 1018  . atenolol (TENORMIN) tablet 25 mg  25 mg Oral BID Mansy, Jan A, MD   25 mg at 08/21/20 2210  . cefTRIAXone (ROCEPHIN) 1 g in sodium chloride 0.9 % 100 mL IVPB  1 g Intravenous Q24H Mansy, Jan A, MD 200 mL/hr at 08/21/20 2210 1 g at 08/21/20 2210  . gabapentin (NEURONTIN) capsule 100 mg  100 mg Oral BID Lorella Nimrod, MD   100 mg at 08/21/20 2210  . insulin aspart (novoLOG) injection 0-9 Units  0-9 Units Subcutaneous TID PC & HS Mansy, Arvella Merles, MD   1 Units at 08/21/20 2231  . magnesium hydroxide (MILK OF MAGNESIA) suspension 30 mL  30 mL Oral Daily PRN  Mansy, Jan A, MD      . midodrine (PROAMATINE) tablet 10 mg  10 mg Oral TID WC Mansy, Jan A, MD   10 mg at 08/21/20 1018  . mirtazapine (REMERON) tablet 7.5 mg  7.5 mg Oral QHS Mansy, Jan A, MD   7.5 mg at 08/21/20 2210  . ondansetron (ZOFRAN) tablet 4 mg  4 mg Oral Q6H PRN Mansy, Jan A, MD       Or  . ondansetron [facility]) injection 4 mg  4 mg Intravenous Q6H PRN Mansy, Jan A, MD      . pravastatin (PRAVACHOL) tablet 20 mg  20 mg Oral QPM Mansy, Jan A, MD      . traZODone (DESYREL) tablet 25 mg  25 mg Oral QHS PRN Mansy, Arvella Merles, MD         Discharge Medications: Please see discharge summary for a list of discharge medications.  Relevant Imaging Results:  Relevant Lab Results:   Additional Information SS: [id number]  Shelbie Ammons, RN

## 2020-08-22 NOTE — Progress Notes (Signed)
PROGRESS NOTE    Tracy Mcmahon  GQQ:[id number] DOB: 29-Jan-1929 DOA: 08/21/2020 PCP: Tracy Kayser, MD   Brief Narrative: Taken from H&P Tracy Mcmahon  is a 86 y.o. Caucasian female with a known history of multiple medical problems including stage III chronic kidney disease, type diabetes mellitus, hypertension, dyslipidemia and depression, presented to the emergency room with failure to thrive.  The patient stopped walking several days ago and has been staying in her recliner.  She is cared for by her disabled daughter who lately has not been able to care for her.  She has been having dysuria and urinary frequency urgency without hematuria or flank pain. UA with pyuria, cultures pending, COVID-19 negative.  Chest x-ray with small pleural effusions and basilar atelectasis and central bronchiectasis. She was started on Rocephin and received 1 L of bolus.  Subjective: Patient was resting comfortably when seen today.  Easily arousable, denies any complaint.  Assessment & Plan:   Active Problems:   FTT (failure to thrive) in adult  Failure to thrive.  Multifactorial which include generalized weakness and inability to walk.  UTI might be contributory.  Patient has advanced dementia at baseline.  Disabled daughter is unable to provide care. -PT/OT evaluation-recommending SNF placement. -Patient will need a memory unit placement.  Possible UTI.  Can be contributory to her current worsening weakness. UA with pyuria.  Blood cultures negative, urine cultures with E. coli-pending susceptibility. -Continue ceftriaxone for now-we will de-escalate once culture results are available.  Stage II small decubitus sacral ulcer. -DuoDERM was applied. -She'll need frequent postural changes.  Hypertension.  Blood pressure mildly elevated. -Continue home dose of amlodipine and Tenormin. -Continue to monitor  Type 2 diabetes.  Appears well controlled with A1c of 6. -Continue with SSI.  Objective: Vitals:    08/21/20 1730 08/21/20 1835 08/21/20 2234 08/22/20 0757  BP: 115/61 125/75 (!) 148/45 (!) 167/41  Pulse:  75 79 83  Resp:  17 18 16   Temp:  97.9 F (36.6 C) 98.8 F (37.1 C) 98.4 F (36.9 C)  TempSrc:   Oral Oral  SpO2:  95% 100% 100%  Weight:       No intake or output data in the 24 hours ending 08/22/20 1608 Filed Weights   08/21/20 1646  Weight: 55.5 kg    Examination:  General.  Frail elderly lady, hard of hearing, in no acute distress. Pulmonary.  Lungs clear bilaterally, normal respiratory effort. CV.  Regular rate and rhythm, no JVD, rub or murmur. Abdomen.  Soft, nontender, nondistended, BS positive. CNS.  Alert and oriented to self only.  No focal neurologic deficit. Extremities.  No edema, no cyanosis, pulses intact and symmetrical. Psychiatry.  Judgment and insight appears impaired.  DVT prophylaxis: Eliquis Code Status: Full Family Communication: Daughter was updated on phone. Disposition Plan:  Status is: Inpatient  Remains inpatient appropriate because:Inpatient level of care appropriate due to severity of illness   Dispo: The patient is from: Home              Anticipated d/c is to: SNF              Anticipated d/c date is: 2 days              Patient currently is not medically stable to d/c.   Consultants:   None  Procedures:  Antimicrobials:  Ceftriaxone  Data Reviewed: I have personally reviewed following labs and imaging studies  CBC: Recent Labs  Lab 08/20/20 2207 08/21/20  0450  WBC 7.2 6.4  HGB 10.6* 10.7*  HCT 34.8* 35.1*  MCV 89.9 90.5  PLT 213 299   Basic Metabolic Panel: Recent Labs  Lab 08/20/20 2207 08/21/20 0450  NA 139 142  K 4.1 3.9  CL 99 105  CO2 30 27  GLUCOSE 124* 111*  BUN 25* 22  CREATININE 1.17* 1.06*  CALCIUM 9.0 8.6*   GFR: CrCl cannot be calculated (Unknown ideal weight.). Liver Function Tests: Recent Labs  Lab 08/20/20 2207  AST 17  ALT 12  ALKPHOS 70  BILITOT 0.4  PROT 6.6  ALBUMIN  3.1*   Recent Labs  Lab 08/20/20 2207  LIPASE 25   No results for input(s): AMMONIA in the last 168 hours. Coagulation Profile: No results for input(s): INR, PROTIME in the last 168 hours. Cardiac Enzymes: No results for input(s): CKTOTAL, CKMB, CKMBINDEX, TROPONINI in the last 168 hours. BNP (last 3 results) No results for input(s): PROBNP in the last 8760 hours. HbA1C: Recent Labs    08/21/20 0450  HGBA1C 6.0*   CBG: Recent Labs  Lab 08/21/20 0822 08/21/20 1204 08/21/20 2054 08/22/20 0812 08/22/20 1202  GLUCAP 116* 160* 145* 100* 97   Lipid Profile: No results for input(s): CHOL, HDL, LDLCALC, TRIG, CHOLHDL, LDLDIRECT in the last 72 hours. Thyroid Function Tests: No results for input(s): TSH, T4TOTAL, FREET4, T3FREE, THYROIDAB in the last 72 hours. Anemia Panel: No results for input(s): VITAMINB12, FOLATE, FERRITIN, TIBC, IRON, RETICCTPCT in the last 72 hours. Sepsis Labs: Recent Labs  Lab 08/20/20 2207 08/21/20 0705  LATICACIDVEN 1.2 0.8    Recent Results (from the past 240 hour(s))  Blood culture (routine x 2)     Status: None (Preliminary result)   Collection Time: 08/20/20 10:07 PM   Specimen: BLOOD  Result Value Ref Range Status   Specimen Description BLOOD RIGHT [facility]  Final   Special Requests   Final    BOTTLES DRAWN AEROBIC AND ANAEROBIC Blood Culture adequate volume   Culture   Final    NO GROWTH 2 DAYS Performed at [facility], [address], [place], [place] [address]    Report Status PENDING  Incomplete  Urine culture     Status: Abnormal (Preliminary result)   Collection Time: 08/21/20  2:43 AM   Specimen: Urine, Random  Result Value Ref Range Status   Specimen Description   Final    URINE, RANDOM Performed at [facility], [address] [place], [place] [address]    Special Requests   Final    NONE Performed at [facility], [address], [place], [place] [address]    Culture (A)  Final    >=100,000  COLONIES/mL ESCHERICHIA COLI SUSCEPTIBILITIES TO FOLLOW Performed at [facility], [place] [address], [place], [place] [address]    Report Status PENDING  Incomplete  Culture, blood (Routine X 2) w Reflex to ID Panel     Status: None (Preliminary result)   Collection Time: 08/21/20  2:55 AM   Specimen: BLOOD  Result Value Ref Range Status   Specimen Description BLOOD LEFT ANTECUBITAL  Final   Special Requests   Final    BOTTLES DRAWN AEROBIC AND ANAEROBIC Blood Culture adequate volume   Culture   Final    NO GROWTH 1 DAY Performed at [facility], [address] [place],  [address]    Report Status PENDING  Incomplete  Respiratory Panel by RT PCR (Flu A&B, Covid) - Nasopharyngeal Swab  Status: None   Collection Time: 08/21/20  4:50 AM   Specimen: Nasopharyngeal Swab  Result Value Ref Range Status   SARS Coronavirus 2 by RT PCR NEGATIVE NEGATIVE Final    Comment: (NOTE) SARS-CoV-2 target nucleic acids are NOT DETECTED.  The SARS-CoV-2 RNA is generally detectable in upper respiratoy specimens during the acute phase of infection. The lowest concentration of SARS-CoV-2 viral copies this assay can detect is 131 copies/mL. A negative result does not preclude SARS-Cov-2 infection and should not be used as the sole basis for treatment or other patient management decisions. A negative result may occur with  improper specimen collection/handling, submission of specimen other than nasopharyngeal swab, presence of viral mutation(s) within the areas targeted by this assay, and inadequate number of viral copies (<131 copies/mL). A negative result must be combined with clinical observations, patient history, and epidemiological information. The expected result is Negative.  Fact Sheet for Patients:  PinkCheek.be  Fact Sheet for Healthcare Providers:  GravelBags.it  This test is no t yet approved or cleared  by the [place] FDA and  has been authorized for detection and/or diagnosis of SARS-CoV-2 by FDA under an Emergency Use Authorization (EUA). This EUA will remain  in effect (meaning this test can be used) for the duration of the COVID-19 declaration under Section 564(b)(1) of the Act, 21 U.S.C. section 360bbb-3(b)(1), unless the authorization is terminated or revoked sooner.     Influenza A by PCR NEGATIVE NEGATIVE Final   Influenza B by PCR NEGATIVE NEGATIVE Final    Comment: (NOTE) The Xpert Xpress SARS-CoV-2/FLU/RSV assay is intended as an aid in  the diagnosis of influenza from Nasopharyngeal swab specimens and  should not be used as a sole basis for treatment. Nasal washings and  aspirates are unacceptable for Xpert Xpress SARS-CoV-2/FLU/RSV  testing.  Fact Sheet for Patients: PinkCheek.be  Fact Sheet for Healthcare Providers: GravelBags.it  This test is not yet approved or cleared by the [place] FDA and  has been authorized for detection and/or diagnosis of SARS-CoV-2 by  FDA under an Emergency Use Authorization (EUA). This EUA will remain  in effect (meaning this test can be used) for the duration of the  Covid-19 declaration under Section 564(b)(1) of the Act, 21  U.S.C. section 360bbb-3(b)(1), unless the authorization is  terminated or revoked. Performed at [facility], [address], [place], [place] [address]      Radiology Studies: [facility] Chest [place] 1 View  Result Date: 08/21/2020 CLINICAL DATA:  Weakness and strong urine smell for 3 days. EXAM: PORTABLE CHEST 1 VIEW COMPARISON:  04/18/2019 FINDINGS: Mild cardiac enlargement. No vascular congestion or edema. Small left pleural effusion with basilar atelectasis. Central bronchiectasis bilaterally. No pneumothorax. Calcification of the aorta. Degenerative changes in the spine and shoulders. Surgical clips in the upper abdomen. IMPRESSION:  Small left pleural effusion with basilar atelectasis. Central bronchiectasis. Electronically Signed   By: Lucienne Capers M.D.   On: 08/21/2020 02:23    Scheduled Meds: . amLODipine  2.5 mg Oral Daily  . apixaban  2.5 mg Oral BID  . aspirin EC  81 mg Oral Daily  . atenolol  25 mg Oral BID  . gabapentin  100 mg Oral BID  . insulin aspart  0-9 Units Subcutaneous TID PC & HS  . midodrine  10 mg Oral TID WC  . mirtazapine  7.5 mg Oral QHS  . pravastatin  20 mg Oral QPM   Continuous Infusions: . sodium chloride 100 mL/hr  at 08/22/20 0504  . cefTRIAXone (ROCEPHIN)  IV 1 g (08/21/20 2210)     LOS: 1 day   Time spent: 25 minutes  Lorella Nimrod, MD [facility]  If 7PM-7AM, please contact night-coverage [email or web address]  08/22/2020, 4:08 PM   This record has been created using Systems analyst. Errors have been sought and corrected,but may not always be located. Such creation errors do not reflect on the standard of care.

## 2020-08-23 ENCOUNTER — Inpatient Hospital Stay: Payer: Medicare HMO

## 2020-08-23 DIAGNOSIS — K5900 Constipation, unspecified: Secondary | ICD-10-CM

## 2020-08-23 DIAGNOSIS — R109 Unspecified abdominal pain: Secondary | ICD-10-CM

## 2020-08-23 DIAGNOSIS — I10 Essential (primary) hypertension: Secondary | ICD-10-CM

## 2020-08-23 DIAGNOSIS — R112 Nausea with vomiting, unspecified: Secondary | ICD-10-CM

## 2020-08-23 DIAGNOSIS — B9620 Unspecified Escherichia coli [E. coli] as the cause of diseases classified elsewhere: Secondary | ICD-10-CM

## 2020-08-23 LAB — URINE CULTURE: Culture: >=100000 — AB

## 2020-08-23 LAB — GLUCOSE, CAPILLARY
Glucose-Capillary: 109 mg/dL — ABNORMAL HIGH (ref 70–99)
Glucose-Capillary: 128 mg/dL — ABNORMAL HIGH (ref 70–99)
Glucose-Capillary: 84 mg/dL (ref 70–99)

## 2020-08-23 MED ORDER — PANCRELIPASE (LIP-PROT-AMYL) 12000-38000 UNITS PO CPEP
72000.0000 [IU] | ORAL_CAPSULE | Freq: Three times a day (TID) | ORAL | Status: DC
  Administered 2020-08-23 – 2020-08-24 (×3): 72000 [IU] via ORAL
  Filled 2020-08-23 (×5): qty 6

## 2020-08-23 MED ORDER — HALOPERIDOL LACTATE 5 MG/ML IJ SOLN
1.0000 mg | Freq: Four times a day (QID) | INTRAMUSCULAR | Status: DC | PRN
  Filled 2020-08-23: qty 1

## 2020-08-23 MED ORDER — IOHEXOL 9 MG/ML PO SOLN
500.0000 mL | ORAL | Status: AC
  Administered 2020-08-23 (×2): 500 mL via ORAL

## 2020-08-23 MED ORDER — TRAZODONE HCL 50 MG PO TABS
50.0000 mg | ORAL_TABLET | Freq: Every day | ORAL | Status: DC
  Administered 2020-08-23: 50 mg via ORAL
  Filled 2020-08-23: qty 1

## 2020-08-23 MED ORDER — HALOPERIDOL 1 MG PO TABS
1.0000 mg | ORAL_TABLET | Freq: Three times a day (TID) | ORAL | Status: DC | PRN
  Administered 2020-08-23 (×2): 1 mg via ORAL
  Filled 2020-08-23 (×4): qty 1

## 2020-08-23 MED ORDER — BISACODYL 10 MG RE SUPP: 10.0000 mg | Freq: Once | RECTAL | Status: DC

## 2020-08-23 NOTE — Progress Notes (Signed)
PT Cancellation Note  Patient Details [name] MRN: [id number] DOB: November 25, 1929   Cancelled Treatment:    Reason Eval/Treat Not Completed:  (Pt eating breakfast at this time, family and tech requested to allow pt to finish eating prior to PT. PT to re-attempt as able.)   Lieutenant Diego PT, DPT 9:30 AM,08/23/20

## 2020-08-23 NOTE — TOC Progression Note (Signed)
Transition of Care [facility]) - Progression Note    Patient Details  [name] MRN: [id number] Date of Birth: 09/26/1929  [facility] CM/SW [place], RN Phone Number: 08/23/2020, 10:03 AM  Clinical Narrative:   RNCM met with daughter [name] at bedside. Bed offers received and presented from [facility] and [facility]. Daughter would prefer [facility], they had not responded in the hub so this CM reached out to [facility] to see if they could look at patient. RNCM will follow.     Expected Discharge Plan: [place] Barriers to Discharge: Continued Medical Work up  Expected Discharge Plan and Services Expected Discharge Plan: [place] In-house Referral: Clinical Social Work     Living arrangements for the past 2 months: Single Family Home                                       Social Determinants of Health (SDOH) Interventions    Readmission Risk Interventions No flowsheet data found.

## 2020-08-23 NOTE — Progress Notes (Signed)
Patient ID: Tracy Mcmahon, female   DOB: 05-02-1929, 86 y.o.   MRN: [id number] [facility] PROGRESS NOTE  Tracy Mcmahon LNL:[id number] DOB: August 27, 1929 DOA: 08/21/2020 PCP: [name], MD  HPI/Subjective: Patient complains of nausea vomiting going on for a while.  Has some abdominal discomfort.  No diarrhea or constipation.  Did not sleep very well last night.  She was combative with the nursing staff and pulled out an IV.  Objective: Vitals:   08/22/20 2351 08/23/20 0801  BP: (!) 141/52 (!) 158/80  Pulse: 68 (!) 52  Resp: 14 17  Temp: 98.2 F (36.8 C) 98.3 F (36.8 C)  SpO2: 98% 100%    Intake/Output Summary (Last 24 hours) at 08/23/2020 1532 Last data filed at 08/23/2020 4081 Gross per 24 hour  Intake 2200 ml  Output 200 ml  Net 2000 ml   Filed Weights   08/21/20 1646  Weight: 55.5 kg    ROS: Review of Systems  Respiratory: Negative for shortness of breath.   Cardiovascular: Negative for chest pain.  Gastrointestinal: Positive for abdominal pain, nausea and vomiting.  Musculoskeletal: Negative for joint pain.   Exam: Physical Exam HENT:     Head: Normocephalic.     Mouth/Throat:     Pharynx: No oropharyngeal exudate.  Eyes:     General: Lids are normal.     Conjunctiva/sclera: Conjunctivae normal.     Pupils: Pupils are equal, round, and reactive to light.  Cardiovascular:     Rate and Rhythm: Normal rate and regular rhythm.     Heart sounds: Normal heart sounds, S1 normal and S2 normal.  Pulmonary:     Breath sounds: Examination of the right-lower field reveals decreased breath sounds. Examination of the left-lower field reveals decreased breath sounds. Decreased breath sounds present. No wheezing, rhonchi or rales.  Abdominal:     General: Bowel sounds are normal.     Palpations: Abdomen is soft.     Tenderness: There is generalized abdominal tenderness.  Musculoskeletal:     Right lower leg: No swelling.     Left lower leg: No swelling.  Skin:     General: Skin is warm.     Findings: No lesion.     Comments: Blood on the bed sheets from where she pulled out the IV.  Neurological:     Mental Status: She is alert.     Comments: Follows some simple commands.       Data Reviewed: Basic Metabolic Panel: Recent Labs  Lab 08/20/20 2207 08/21/20 0450  NA 139 142  K 4.1 3.9  CL 99 105  CO2 30 27  GLUCOSE 124* 111*  BUN 25* 22  CREATININE 1.17* 1.06*  CALCIUM 9.0 8.6*   Liver Function Tests: Recent Labs  Lab 08/20/20 2207  AST 17  ALT 12  ALKPHOS 70  BILITOT 0.4  PROT 6.6  ALBUMIN 3.1*   Recent Labs  Lab 08/20/20 2207  LIPASE 25   CBC: Recent Labs  Lab 08/20/20 2207 08/21/20 0450  WBC 7.2 6.4  HGB 10.6* 10.7*  HCT 34.8* 35.1*  MCV 89.9 90.5  PLT 213 204    CBG: Recent Labs  Lab 08/22/20 0812 08/22/20 1202 08/22/20 1620 08/22/20 2153 08/23/20 1208  GLUCAP 100* 97 91 160* 109*    Recent Results (from the past 240 hour(s))  Blood culture (routine x 2)     Status: None (Preliminary result)   Collection Time: 08/20/20 10:07 PM   Specimen: BLOOD  Result  Value Ref Range Status   Specimen Description BLOOD RIGHT [facility]  Final   Special Requests   Final    BOTTLES DRAWN AEROBIC AND ANAEROBIC Blood Culture adequate volume   Culture   Final    NO GROWTH 3 DAYS Performed at [facility], [address] [place], [place] [address]    Report Status PENDING  Incomplete  Urine culture     Status: Abnormal   Collection Time: 08/21/20  2:43 AM   Specimen: Urine, Random  Result Value Ref Range Status   Specimen Description   Final    URINE, RANDOM Performed at [facility], [address] [place], [place] [address]    Special Requests   Final    NONE Performed at [facility], [address] [place],  [address]    Culture >=100,000 COLONIES/mL ESCHERICHIA COLI (A)  Final   Report Status 08/23/2020 FINAL  Final   Organism ID, Bacteria ESCHERICHIA COLI (A)  Final       Susceptibility   Escherichia coli - MIC*    AMPICILLIN <=2 SENSITIVE Sensitive     CEFAZOLIN <=4 SENSITIVE Sensitive     CEFTRIAXONE <=0.25 SENSITIVE Sensitive     CIPROFLOXACIN <=0.25 SENSITIVE Sensitive     GENTAMICIN <=1 SENSITIVE Sensitive     IMIPENEM <=0.25 SENSITIVE Sensitive     NITROFURANTOIN <=16 SENSITIVE Sensitive     TRIMETH/SULFA <=20 SENSITIVE Sensitive     AMPICILLIN/SULBACTAM <=2 SENSITIVE Sensitive     PIP/TAZO <=4 SENSITIVE Sensitive     * >=100,000 COLONIES/mL ESCHERICHIA COLI  Culture, blood (Routine X 2) w Reflex to ID Panel     Status: None (Preliminary result)   Collection Time: 08/21/20  2:55 AM   Specimen: BLOOD  Result Value Ref Range Status   Specimen Description BLOOD LEFT ANTECUBITAL  Final   Special Requests   Final    BOTTLES DRAWN AEROBIC AND ANAEROBIC Blood Culture adequate volume   Culture   Final    NO GROWTH 2 DAYS Performed at [facility], [address], [place],  [address]    Report Status PENDING  Incomplete  Respiratory Panel by RT PCR (Flu A&B, Covid) - Nasopharyngeal Swab     Status: None   Collection Time: 08/21/20  4:50 AM   Specimen: Nasopharyngeal Swab  Result Value Ref Range Status   SARS Coronavirus 2 by RT PCR NEGATIVE NEGATIVE Final    Comment: (NOTE) SARS-CoV-2 target nucleic acids are NOT DETECTED.  The SARS-CoV-2 RNA is generally detectable in upper respiratoy specimens during the acute phase of infection. The lowest concentration of SARS-CoV-2 viral copies this assay can detect is 131 copies/mL. A negative result does not preclude SARS-Cov-2 infection and should not be used as the sole basis for treatment or other patient management decisions. A negative result may occur with  improper specimen collection/handling, submission of specimen other than nasopharyngeal swab, presence of viral mutation(s) within the areas targeted by this assay, and inadequate number of viral copies (<131 copies/mL). A  negative result must be combined with clinical observations, patient history, and epidemiological information. The expected result is Negative.  Fact Sheet for Patients:  PinkCheek.be  Fact Sheet for Healthcare Providers:  GravelBags.it  This test is no t yet approved or cleared by the [place] FDA and  has been authorized for detection and/or diagnosis of SARS-CoV-2 by FDA under an Emergency Use Authorization (EUA). This EUA will remain  in effect (meaning this test can  be used) for the duration of the COVID-19 declaration under Section 564(b)(1) of the Act, 21 U.S.C. section 360bbb-3(b)(1), unless the authorization is terminated or revoked sooner.     Influenza A by PCR NEGATIVE NEGATIVE Final   Influenza B by PCR NEGATIVE NEGATIVE Final    Comment: (NOTE) The Xpert Xpress SARS-CoV-2/FLU/RSV assay is intended as an aid in  the diagnosis of influenza from Nasopharyngeal swab specimens and  should not be used as a sole basis for treatment. Nasal washings and  aspirates are unacceptable for Xpert Xpress SARS-CoV-2/FLU/RSV  testing.  Fact Sheet for Patients: PinkCheek.be  Fact Sheet for Healthcare Providers: GravelBags.it  This test is not yet approved or cleared by the [place] FDA and  has been authorized for detection and/or diagnosis of SARS-CoV-2 by  FDA under an Emergency Use Authorization (EUA). This EUA will remain  in effect (meaning this test can be used) for the duration of the  Covid-19 declaration under Section 564(b)(1) of the Act, 21  U.S.C. section 360bbb-3(b)(1), unless the authorization is  terminated or revoked. Performed at [facility], [address], [place], [place] [address]      Studies: CT ABDOMEN PELVIS WO CONTRAST  Result Date: 08/23/2020 CLINICAL DATA:  Nausea and vomiting. Reported urinary tract infection  EXAM: CT ABDOMEN AND PELVIS WITHOUT CONTRAST TECHNIQUE: Multidetector CT imaging of the abdomen and pelvis was performed following the standard protocol without IV contrast. Oral contrast administered. COMPARISON:  March 24, 2014 FINDINGS: Lower chest: There are fairly small pleural effusions bilaterally with consolidation and compressive atelectasis in the lung bases. There are multiple foci of coronary artery calcification. There is a small pericardial effusion. There is mitral annulus calcification. Hepatobiliary: Scattered liver cysts are again noted, stable. No noncystic liver lesions are evident on this noncontrast enhanced study. There are small apparent gallstones within the gallbladder. The gallbladder appears mildly distended without wall thickening. No biliary duct dilatation is evident. Pancreas: Cystic areas in the body of the pancreas appear stable, largest measuring 3.5 x 2.9 cm. Stable pancreatic calcification also noted. Suspect changes of chronic pancreatitis. Pancreas essentially unchanged in appearance compared to the 2015 study. Spleen: No splenic lesions are evident. Adrenals/Urinary Tract: Adrenals bilaterally appear unremarkable. There is a cyst arising from the upper pole of the right kidney measuring 5.6 x 4.6 cm. Left kidney absent. No lesions seen in the left pararenal fossa. There is no right-sided hydronephrosis. No renal or ureteral calculus evident on the right. Urinary bladder is midline with wall thickness within normal limits. Note that artifact from total hip prostheses makes evaluation of the bladder somewhat less than optimal. Stomach/Bowel: Rectum is distended with stool. There is artifact from the total hip replacements making evaluation of the rectum somewhat less than optimal. There does appear to be soft tissue thickening in the perirectal region. There are sigmoid diverticula without diverticulitis. Elsewhere, there is no appreciable bowel wall or mesenteric thickening.  There is no appreciable bowel obstruction. Terminal ileal region appears unremarkable. There is no evident free air or portal venous air. Vascular/Lymphatic: There is aortic and iliac artery atherosclerosis. No aneurysm evident. No adenopathy is appreciable in the abdomen or pelvis. Reproductive: Uterus absent.  No pelvic mass. Other: No periappendiceal region inflammation. No abscess or ascites evident in the abdomen or pelvis. Musculoskeletal: Total hip replacements bilaterally. Lumbar levoscoliosis noted. No blastic or lytic bone lesions. Spinal stenosis noted at L4-5 due to severe bony hypertrophy and diffuse disc protrusion. No intramuscular or abdominal wall  lesions are appreciable. IMPRESSION: 1. Rectum is distended with stool. Soft tissue thickening in the perirectal region raises concern for a degree of stercoral proctitis. 2. Fairly small pleural effusions bilaterally with a degree of consolidation and compressive atelectasis in the lung bases. 3. Evidence suggesting chronic pancreatitis, with pancreas essentially stable in appearance compared to prior study from 2015. A cystadenoma in the body of the pancreas, stable since the 2015 study, is a possibility, potentially superimposed on pancreatitis. 4. Gallbladder is mildly distended with small gallstones. No gallbladder wall thickening evident by CT. 5. Absent left kidney. No right renal hydronephrosis. No renal or ureteral calculus on the right. Urinary bladder wall thickness normal. 6. Sigmoid diverticula without diverticulitis. No bowel obstruction. No abscess in the abdomen or pelvis. No periappendiceal region inflammatory change. 7. Aortic Atherosclerosis (ICD10-I70.0). There are foci of coronary artery and pelvic arterial vascular calcification. 8.  Small pericardial effusion. 9. Spinal stenosis at L4-5 due to disc protrusion and bony hypertrophy. 10.  Total hip replacements bilaterally. 11.  Uterus absent. Electronically Signed   By: Lowella Grip III M.D.   On: 08/23/2020 14:18    Scheduled Meds: . amLODipine  2.5 mg Oral Daily  . apixaban  2.5 mg Oral BID  . aspirin EC  81 mg Oral Daily  . atenolol  25 mg Oral BID  . bisacodyl  10 mg Rectal Once  . gabapentin  100 mg Oral BID  . insulin aspart  0-9 Units Subcutaneous TID PC & HS  . lipase/protease/amylase  72,000 Units Oral TID AC  . midodrine  10 mg Oral TID WC  . mirtazapine  7.5 mg Oral QHS  . pravastatin  20 mg Oral QPM  . traZODone  50 mg Oral QHS   Continuous Infusions: . sodium chloride 100 mL/hr at 08/22/20 0504  . cefTRIAXone (ROCEPHIN)  IV 1 g (08/22/20 2006)    Assessment/Plan:  1. Nausea vomiting with abdominal pain.  CT scan of the abdomen shows findings consistent with chronic pancreatitis and cyst.  I will give a trial of Creon.  As needed nausea medications.  We will get ultrasound right upper quadrant tomorrow morning. 2. E. coli UTI on Rocephin 3. Essential hypertension and orthostatic hypotension.  Patient on midodrine and atenolol.  I will get rid of the Norvasc at this point. 4. Acute kidney injury.  Improved with IV fluids.  Check creatinine again tomorrow morning. 5. Impaired fasting glucose.  Get rid of sliding scale since hemoglobin A1c 6.0. 6. Insomnia continue Remeron and trazodone increased dose. 7. Stage II decubiti present on admission see documentation below 8. Constipation seen on CT scan will give a Dulcolax suppository. 9. Generalized weakness.  Physical therapy recommends rehab.  Pressure Injury 08/14/18 Stage II -  Partial thickness loss of dermis presenting as a shallow open ulcer with a red, pink wound bed without slough. (Active)  08/14/18 2231  Location: Buttocks  Location Orientation: Left;Lower  Staging: Stage II -  Partial thickness loss of dermis presenting as a shallow open ulcer with a red, pink wound bed without slough.  Wound Description (Comments):   Present on Admission: Yes     Pressure Injury 08/14/18  Stage II -  Partial thickness loss of dermis presenting as a shallow open ulcer with a red, pink wound bed without slough. (Active)  08/14/18 2231  Location: Buttocks  Location Orientation: Left;Upper  Staging: Stage II -  Partial thickness loss of dermis presenting as a shallow open ulcer with a  red, pink wound bed without slough.  Wound Description (Comments):   Present on Admission: Yes       Code Status:     Code Status Orders  (From admission, onward)         Start     Ordered   08/21/20 0340  Full code  Continuous        08/21/20 0341        Code Status History    Date Active Date Inactive Code Status Order ID Comments User Context   10/20/2018 0303 10/21/2018 2252 Full Code [id number]  Arta Silence, MD Inpatient   08/14/2018 2245 08/16/2018 1829 Partial Code [id number]  Sela Hua, MD ED   06/26/2018 1128 06/29/2018 2059 Full Code [id number]  Gladstone Lighter, MD Inpatient   04/30/2018 1446 05/03/2018 1507 Full Code [id number]  Saundra Shelling, MD Inpatient   03/15/2018 2153 03/17/2018 1803 Full Code [id number]  Lance Coon, MD Inpatient   12/17/2016 0125 12/19/2016 1856 Full Code [id number]  Theodoro Grist, MD Inpatient   08/11/2016 0324 08/11/2016 0937 Full Code [id number]  Lance Coon, MD Inpatient   03/29/2015 2115 03/30/2015 1555 Full Code [id number]  Nicholes Mango, MD Inpatient   Advance Care Planning Activity     Family Communication: Tried to reach daughter but able to do so. Disposition Plan: Status is: Inpatient  Dispo:  Patient From: Home  Planned Disposition: [place]  Expected discharge date: 08/24/20  Medically trying Creon to see if this helps out with her nausea vomiting and abdominal pain.  Time spent: 27 minutes  [place]

## 2020-08-23 NOTE — Plan of Care (Signed)

## 2020-08-23 NOTE — Progress Notes (Signed)
Patient removed IV

## 2020-08-23 NOTE — Progress Notes (Signed)
PT Cancellation Note  Patient Details [name] MRN: [id number] DOB: 1929/07/02   Cancelled Treatment:    Reason Eval/Treat Not Completed: Other (comment) (PT entered room, pt sleeping soundly. Soon after PT entrance, transport entered to take patient to imaging. PT to re-attempt as able.)   Lieutenant Diego PT, DPT 1:54 PM,08/23/20

## 2020-08-24 ENCOUNTER — Inpatient Hospital Stay: Payer: Medicare HMO

## 2020-08-24 DIAGNOSIS — Z86718 Personal history of other venous thrombosis and embolism: Secondary | ICD-10-CM | POA: Diagnosis not present

## 2020-08-24 DIAGNOSIS — K861 Other chronic pancreatitis: Secondary | ICD-10-CM

## 2020-08-24 DIAGNOSIS — R627 Adult failure to thrive: Secondary | ICD-10-CM | POA: Diagnosis not present

## 2020-08-24 DIAGNOSIS — R112 Nausea with vomiting, unspecified: Secondary | ICD-10-CM | POA: Diagnosis not present

## 2020-08-24 DIAGNOSIS — E785 Hyperlipidemia, unspecified: Secondary | ICD-10-CM | POA: Diagnosis not present

## 2020-08-24 DIAGNOSIS — L89322 Pressure ulcer of left buttock, stage 2: Secondary | ICD-10-CM | POA: Diagnosis not present

## 2020-08-24 DIAGNOSIS — F3289 Other specified depressive episodes: Secondary | ICD-10-CM | POA: Diagnosis not present

## 2020-08-24 DIAGNOSIS — N390 Urinary tract infection, site not specified: Secondary | ICD-10-CM | POA: Diagnosis not present

## 2020-08-24 DIAGNOSIS — R531 Weakness: Secondary | ICD-10-CM | POA: Diagnosis not present

## 2020-08-24 DIAGNOSIS — L89152 Pressure ulcer of sacral region, stage 2: Secondary | ICD-10-CM | POA: Diagnosis not present

## 2020-08-24 DIAGNOSIS — J90 Pleural effusion, not elsewhere classified: Secondary | ICD-10-CM | POA: Diagnosis not present

## 2020-08-24 DIAGNOSIS — R279 Unspecified lack of coordination: Secondary | ICD-10-CM | POA: Diagnosis not present

## 2020-08-24 DIAGNOSIS — M353 Polymyalgia rheumatica: Secondary | ICD-10-CM | POA: Diagnosis not present

## 2020-08-24 DIAGNOSIS — M6281 Muscle weakness (generalized): Secondary | ICD-10-CM | POA: Diagnosis not present

## 2020-08-24 DIAGNOSIS — I10 Essential (primary) hypertension: Secondary | ICD-10-CM | POA: Diagnosis not present

## 2020-08-24 DIAGNOSIS — R5381 Other malaise: Secondary | ICD-10-CM | POA: Diagnosis not present

## 2020-08-24 DIAGNOSIS — N189 Chronic kidney disease, unspecified: Secondary | ICD-10-CM

## 2020-08-24 DIAGNOSIS — I82411 Acute embolism and thrombosis of right femoral vein: Secondary | ICD-10-CM | POA: Diagnosis not present

## 2020-08-24 DIAGNOSIS — K8020 Calculus of gallbladder without cholecystitis without obstruction: Secondary | ICD-10-CM | POA: Diagnosis not present

## 2020-08-24 DIAGNOSIS — N179 Acute kidney failure, unspecified: Secondary | ICD-10-CM

## 2020-08-24 DIAGNOSIS — R109 Unspecified abdominal pain: Secondary | ICD-10-CM | POA: Diagnosis not present

## 2020-08-24 DIAGNOSIS — L89153 Pressure ulcer of sacral region, stage 3: Secondary | ICD-10-CM | POA: Diagnosis not present

## 2020-08-24 LAB — CBC
HCT: 28.7 % — ABNORMAL LOW (ref 36.0–46.0)
Hemoglobin: 9.4 g/dL — ABNORMAL LOW (ref 12.0–15.0)
MCH: 27.6 pg (ref 26.0–34.0)
MCHC: 32.8 g/dL (ref 30.0–36.0)
MCV: 84.4 fL (ref 80.0–100.0)
Platelets: 213 10*3/uL (ref 150–400)
RBC: 3.4 MIL/uL — ABNORMAL LOW (ref 3.87–5.11)
RDW: 15.7 % — ABNORMAL HIGH (ref 11.5–15.5)
WBC: 4.2 10*3/uL (ref 4.0–10.5)
nRBC: 0 % (ref 0.0–0.2)

## 2020-08-24 LAB — BASIC METABOLIC PANEL
Anion gap: 7 (ref 5–15)
BUN: 12 mg/dL (ref 8–23)
CO2: 26 mmol/L (ref 22–32)
Calcium: 8.7 mg/dL — ABNORMAL LOW (ref 8.9–10.3)
Chloride: 109 mmol/L (ref 98–111)
Creatinine, Ser: 1.05 mg/dL — ABNORMAL HIGH (ref 0.44–1.00)
GFR calc Af Amer: 55 mL/min — ABNORMAL LOW (ref >60)
GFR calc non Af Amer: 47 mL/min — ABNORMAL LOW (ref >60)
Glucose, Bld: 102 mg/dL — ABNORMAL HIGH (ref 70–99)
Potassium: 3.9 mmol/L (ref 3.5–5.1)
Sodium: 142 mmol/L (ref 135–145)

## 2020-08-24 LAB — GLUCOSE, CAPILLARY
Glucose-Capillary: 98 mg/dL (ref 70–99)
Glucose-Capillary: 158 mg/dL — ABNORMAL HIGH (ref 70–99)

## 2020-08-24 MED ORDER — GABAPENTIN 100 MG PO CAPS
100.0000 mg | ORAL_CAPSULE | Freq: Two times a day (BID) | ORAL | 0 refills | Status: DC
Start: 2020-08-24 — End: 2022-05-28

## 2020-08-24 MED ORDER — ONDANSETRON HCL 4 MG PO TABS: 4.0000 mg | ORAL_TABLET | Freq: Four times a day (QID) | ORAL | 0 refills | Status: DC | PRN

## 2020-08-24 MED ORDER — TRAZODONE HCL 50 MG PO TABS
50.0000 mg | ORAL_TABLET | Freq: Every day | ORAL | 0 refills | Status: DC
Start: 2020-08-24 — End: 2022-05-28

## 2020-08-24 MED ORDER — PANCRELIPASE (LIP-PROT-AMYL) 36000-114000 UNITS PO CPEP: 72000.0000 [IU] | ORAL_CAPSULE | Freq: Three times a day (TID) | ORAL | 0 refills | Status: AC

## 2020-08-24 MED ORDER — MIDODRINE HCL 10 MG PO TABS
10.0000 mg | ORAL_TABLET | Freq: Three times a day (TID) | ORAL | 0 refills | Status: DC
Start: 2020-08-24 — End: 2022-05-28

## 2020-08-24 MED ORDER — CEPHALEXIN 500 MG PO CAPS: 500.0000 mg | ORAL_CAPSULE | Freq: Three times a day (TID) | ORAL | 0 refills | Status: AC

## 2020-08-24 NOTE — Plan of Care (Signed)
  Problem: Pain Managment: Goal: General experience of comfort will improve Outcome: Progressing   

## 2020-08-24 NOTE — Care Management Important Message (Signed)
Important Message  Patient Details  Name: NOVIA LANSBERRY MRN: [id number] Date of Birth: [birth year]   Medicare Important Message Given:  Yes     Dannette Barbara 08/24/2020, 1:23 PM

## 2020-08-24 NOTE — Discharge Summary (Signed)
[facility] [name]    MR#:  [id number]  DATE OF BIRTH:  [birth year]  DATE OF ADMISSION:  08/21/2020 ADMITTING PHYSICIAN: Christel Mormon, MD  DATE OF DISCHARGE: 08/24/2020  PRIMARY CARE PHYSICIAN: [name], MD    ADMISSION DIAGNOSIS:  Lower urinary tract infectious disease [N39.0] Weakness generalized [R53.1] FTT (failure to thrive) in adult [R62.7] Sacral decubitus ulcer, stage II ([place]) [L89.152]  DISCHARGE DIAGNOSIS:  Active Problems:   Lower urinary tract infectious disease   Weakness generalized   E. coli UTI   FTT (failure to thrive) in adult   Sacral decubitus ulcer, stage II (HCC)   Nausea and vomiting   Abdominal pain   Constipation   SECONDARY DIAGNOSIS:   Past Medical History:  Diagnosis Date  . Cancer ([place])   . Cataract   . Chronic kidney disease (CKD), stage III (moderate)   . Chronic urticaria   . COPD (chronic obstructive pulmonary disease) ([place])   . Depression   . Diabetes mellitus without complication ([place])   . DVT (deep venous thrombosis) (H[facility])    left leg  . Hyperlipemia   . Hypertension   . Osteoarthritis   . Osteopenia   . Polymyalgia rheumatica ([place])   . Polyneuropathy   . Renal cancer ([place])   . Renal insufficiency 08/10/2016   Chart indicates CKD stage 3  . Restless leg syndrome   . Vitamin D deficiency   . Vulvar intraepithelial neoplasia with lichen sclerosus     HOSPITAL COURSE:   1.  Chronic pancreatitis with nausea vomiting abdominal pain.  I did a CT scan and it was consistent with chronic pancreatitis and a cyst.  We will give a trial of pancreatic enzymes Creon prior to meals.  Right upper quadrant ultrasound did not show any acute cholecystitis but did show some gallstones. 2.  E. coli UTI on Rocephin.  We will give 5 more doses of Keflex upon going to rehab. 3.  Essential hypertension and orthostatic hypotension.  The patient is on midodrine and atenolol.  I got  rid of the Norvasc at this point.  Hopefully can get rid of the midodrine if blood pressure starts going up. 4.  Acute kidney injury on chronic kidney disease stage IIIa..  This has improved with IV fluids.  Creatinine was 1.17 on presentation now down to 1.05. 5.  Insomnia on Remeron and trazodone 6.  Stage II decubiti present on admission.  Left upper and left lower buttock stage II.  Local wound care. 7.  Constipation on CT scan.  Dulcolax suppository given yesterday 8.  Generalized weakness.  Physical therapy recommends rehab. 9.  History of DVT on low-dose Eliquis.  DISCHARGE CONDITIONS:   Satisfactory  CONSULTS OBTAINED:  None  DRUG ALLERGIES:   Allergies  Allergen Reactions  . Fosamax [Alendronate Sodium] Other (See Comments)    Reaction:  Made pt "very sick"  . Wellbutrin [Bupropion] Other (See Comments)    Reaction:  Unknown    DISCHARGE MEDICATIONS:   Allergies as of 08/24/2020      Reactions   Fosamax [alendronate Sodium] Other (See Comments)   Reaction:  Made pt "very sick"   Wellbutrin [bupropion] Other (See Comments)   Reaction:  Unknown      Medication List    STOP taking these medications   albuterol (2.5 MG/3ML) 0.083% nebulizer solution Commonly known as: PROVENTIL   amLODipine 5 MG tablet Commonly known as: NORVASC  aspirin EC 81 MG tablet   Calcium 600/Vitamin D3 600-800 MG-UNIT Tabs Generic drug: Calcium Carb-Cholecalciferol   conjugated estrogens vaginal cream Commonly known as: Premarin   diphenhydramine-acetaminophen 25-500 MG Tabs tablet Commonly known as: TYLENOL PM   diphenhydrAMINE-zinc acetate cream Commonly known as: Benadryl Extra Strength   FLUoxetine 20 MG capsule Commonly known as: PROZAC   furosemide 20 MG tablet Commonly known as: LASIX   GNP CAL MAG ZINC +D3 PO   insulin aspart 100 UNIT/ML injection Commonly known as: novoLOG   insulin glargine 100 UNIT/ML injection Commonly known as: LANTUS   predniSONE 5 MG  tablet Commonly known as: DELTASONE   vitamin B-12 1000 MCG tablet Commonly known as: CYANOCOBALAMIN     TAKE these medications   atenolol 25 MG tablet Commonly known as: TENORMIN Take 25 mg by mouth 2 (two) times daily.   cephALEXin 500 MG capsule Commonly known as: KEFLEX Take 1 capsule (500 mg total) by mouth 3 (three) times daily for 5 doses.   Eliquis 2.5 MG Tabs tablet Generic drug: apixaban Take 2.5 mg by mouth 2 (two) times daily.   gabapentin 100 MG capsule Commonly known as: NEURONTIN Take 1 capsule (100 mg total) by mouth 2 (two) times daily. What changed:   medication strength  how much to take  how to take this  when to take this  additional instructions   lipase/protease/amylase 36000 UNITS Cpep capsule Commonly known as: CREON Take 2 capsules (72,000 Units total) by mouth 3 (three) times daily before meals.   midodrine 10 MG tablet Commonly known as: PROAMATINE Take 1 tablet (10 mg total) by mouth 3 (three) times daily with meals.   mirtazapine 7.5 MG tablet Commonly known as: REMERON Take 7.5 mg by mouth at bedtime.   ondansetron 4 MG tablet Commonly known as: ZOFRAN Take 1 tablet (4 mg total) by mouth every 6 (six) hours as needed for nausea.   pravastatin 20 MG tablet Commonly known as: PRAVACHOL Take 20 mg by mouth every evening.   traZODone 50 MG tablet Commonly known as: DESYREL Take 1 tablet (50 mg total) by mouth at bedtime.   Vitamin D (Ergocalciferol) 1.25 MG (50000 UNIT) Caps capsule Commonly known as: DRISDOL Take 50,000 Units by mouth every Sunday.        DISCHARGE INSTRUCTIONS:   Follow-up team at rehab 1 day  If you experience worsening of your admission symptoms, develop shortness of breath, life threatening emergency, suicidal or homicidal thoughts you must seek medical attention immediately by calling 911 or calling your MD immediately  if symptoms less severe.  You Must read complete instructions/literature  along with all the possible adverse reactions/side effects for all the Medicines you take and that have been prescribed to you. Take any new Medicines after you have completely understood and accept all the possible adverse reactions/side effects.   Please note  You were cared for by a hospitalist during your hospital stay. If you have any questions about your discharge medications or the care you received while you were in the hospital after you are discharged, you can call the unit and asked to speak with the hospitalist on call if the hospitalist that took care of you is not available. Once you are discharged, your primary care physician will handle any further medical issues. Please note that NO REFILLS for any discharge medications will be authorized once you are discharged, as it is imperative that you return to your primary care physician (or establish a  relationship with a primary care physician if you do not have one) for your aftercare needs so that they can reassess your need for medications and monitor your lab values.    Today   CHIEF COMPLAINT:   Chief Complaint  Patient presents with  . Dysuria    HISTORY OF PRESENT ILLNESS:  Tracy Mcmahon  is a 86 y.o. female came in with burning on urination and nausea vomiting and abdominal pain   VITAL SIGNS:  Blood pressure (!) 156/69, pulse 67, temperature 98.1 F (36.7 C), temperature source Oral, resp. rate 15, weight 55.5 kg, SpO2 95 %.  I/O:    Intake/Output Summary (Last 24 hours) at 08/24/2020 1413 Last data filed at 08/24/2020 0044 Gross per 24 hour  Intake 240 ml  Output 600 ml  Net -360 ml    PHYSICAL EXAMINATION:  GENERAL:  86 y.o.-year-old patient lying in the bed with no acute distress.  EYES: Pupils equal, round, reactive to light and accommodation. No scleral icterus. Extraocular muscles intact.  HEENT: Head atraumatic, normocephalic. Oropharynx and nasopharynx clear.   LUNGS: Normal breath sounds bilaterally, no  wheezing, rales,rhonchi or crepitation. No use of accessory muscles of respiration.  CARDIOVASCULAR: S1, S2 normal. No murmurs, rubs, or gallops.  ABDOMEN: Soft, non-tender, non-distended.  EXTREMITIES: No pedal edema.  NEUROLOGIC: Cranial nerves II through XII are intact. Muscle strength 5/5 in all extremities. Sensation intact. Gait not checked.  PSYCHIATRIC: The patient is alert and oriented x 3.  SKIN: No obvious rash, lesion, or ulcer.   DATA REVIEW:   CBC Recent Labs  Lab 08/24/20 0550  WBC 4.2  HGB 9.4*  HCT 28.7*  PLT 213    Chemistries  Recent Labs  Lab 08/20/20 2207 08/21/20 0450 08/24/20 0550  NA 139   < > 142  K 4.1   < > 3.9  CL 99   < > 109  CO2 30   < > 26  GLUCOSE 124*   < > 102*  BUN 25*   < > 12  CREATININE 1.17*   < > 1.05*  CALCIUM 9.0   < > 8.7*  AST 17  --   --   ALT 12  --   --   ALKPHOS 70  --   --   BILITOT 0.4  --   --    < > = values in this interval not displayed.     Microbiology Results  Results for orders placed or performed during the hospital encounter of 08/21/20  Blood culture (routine x 2)     Status: None (Preliminary result)   Collection Time: 08/20/20 10:07 PM   Specimen: BLOOD  Result Value Ref Range Status   Specimen Description BLOOD RIGHT [facility]  Final   Special Requests   Final    BOTTLES DRAWN AEROBIC AND ANAEROBIC Blood Culture adequate volume   Culture   Final    NO GROWTH 4 DAYS Performed at [facility], [address] [place], [place] [address]    Report Status PENDING  Incomplete  Urine culture     Status: Abnormal   Collection Time: 08/21/20  2:43 AM   Specimen: Urine, Random  Result Value Ref Range Status   Specimen Description   Final    URINE, RANDOM Performed at [facility], [address] [place], [place] [address]    Special Requests   Final    NONE Performed at [facility], [address], [place], [place] [address]  Culture >=100,000 COLONIES/mL ESCHERICHIA  COLI (A)  Final   Report Status 08/23/2020 FINAL  Final   Organism ID, Bacteria ESCHERICHIA COLI (A)  Final      Susceptibility   Escherichia coli - MIC*    AMPICILLIN <=2 SENSITIVE Sensitive     CEFAZOLIN <=4 SENSITIVE Sensitive     CEFTRIAXONE <=0.25 SENSITIVE Sensitive     CIPROFLOXACIN <=0.25 SENSITIVE Sensitive     GENTAMICIN <=1 SENSITIVE Sensitive     IMIPENEM <=0.25 SENSITIVE Sensitive     NITROFURANTOIN <=16 SENSITIVE Sensitive     TRIMETH/SULFA <=20 SENSITIVE Sensitive     AMPICILLIN/SULBACTAM <=2 SENSITIVE Sensitive     PIP/TAZO <=4 SENSITIVE Sensitive     * >=100,000 COLONIES/mL ESCHERICHIA COLI  Culture, blood (Routine X 2) w Reflex to ID Panel     Status: None (Preliminary result)   Collection Time: 08/21/20  2:55 AM   Specimen: BLOOD  Result Value Ref Range Status   Specimen Description BLOOD LEFT ANTECUBITAL  Final   Special Requests   Final    BOTTLES DRAWN AEROBIC AND ANAEROBIC Blood Culture adequate volume   Culture   Final    NO GROWTH 3 DAYS Performed at [facility], [address], [place], [place] [address]    Report Status PENDING  Incomplete  Respiratory Panel by RT PCR (Flu A&B, Covid) - Nasopharyngeal Swab     Status: None   Collection Time: 08/21/20  4:50 AM   Specimen: Nasopharyngeal Swab  Result Value Ref Range Status   SARS Coronavirus 2 by RT PCR NEGATIVE NEGATIVE Final    Comment: (NOTE) SARS-CoV-2 target nucleic acids are NOT DETECTED.  The SARS-CoV-2 RNA is generally detectable in upper respiratoy specimens during the acute phase of infection. The lowest concentration of SARS-CoV-2 viral copies this assay can detect is 131 copies/mL. A negative result does not preclude SARS-Cov-2 infection and should not be used as the sole basis for treatment or other patient management decisions. A negative result may occur with  improper specimen collection/handling, submission of specimen other than nasopharyngeal swab, presence of viral  mutation(s) within the areas targeted by this assay, and inadequate number of viral copies (<131 copies/mL). A negative result must be combined with clinical observations, patient history, and epidemiological information. The expected result is Negative.  Fact Sheet for Patients:  PinkCheek.be  Fact Sheet for Healthcare Providers:  GravelBags.it  This test is no t yet approved or cleared by the [place] FDA and  has been authorized for detection and/or diagnosis of SARS-CoV-2 by FDA under an Emergency Use Authorization (EUA). This EUA will remain  in effect (meaning this test can be used) for the duration of the COVID-19 declaration under Section 564(b)(1) of the Act, 21 U.S.C. section 360bbb-3(b)(1), unless the authorization is terminated or revoked sooner.     Influenza A by PCR NEGATIVE NEGATIVE Final   Influenza B by PCR NEGATIVE NEGATIVE Final    Comment: (NOTE) The Xpert Xpress SARS-CoV-2/FLU/RSV assay is intended as an aid in  the diagnosis of influenza from Nasopharyngeal swab specimens and  should not be used as a sole basis for treatment. Nasal washings and  aspirates are unacceptable for Xpert Xpress SARS-CoV-2/FLU/RSV  testing.  Fact Sheet for Patients: PinkCheek.be  Fact Sheet for Healthcare Providers: GravelBags.it  This test is not yet approved or cleared by the [place] FDA and  has been authorized for detection and/or diagnosis of SARS-CoV-2 by  FDA under an Emergency Use Authorization (EUA).  This EUA will remain  in effect (meaning this test can be used) for the duration of the  Covid-19 declaration under Section 564(b)(1) of the Act, 21  U.S.C. section 360bbb-3(b)(1), unless the authorization is  terminated or revoked. Performed at [facility], [address] [place], [place] [address]     RADIOLOGY:  CT ABDOMEN  PELVIS WO CONTRAST  Result Date: 08/23/2020 CLINICAL DATA:  Nausea and vomiting. Reported urinary tract infection EXAM: CT ABDOMEN AND PELVIS WITHOUT CONTRAST TECHNIQUE: Multidetector CT imaging of the abdomen and pelvis was performed following the standard protocol without IV contrast. Oral contrast administered. COMPARISON:  March 24, 2014 FINDINGS: Lower chest: There are fairly small pleural effusions bilaterally with consolidation and compressive atelectasis in the lung bases. There are multiple foci of coronary artery calcification. There is a small pericardial effusion. There is mitral annulus calcification. Hepatobiliary: Scattered liver cysts are again noted, stable. No noncystic liver lesions are evident on this noncontrast enhanced study. There are small apparent gallstones within the gallbladder. The gallbladder appears mildly distended without wall thickening. No biliary duct dilatation is evident. Pancreas: Cystic areas in the body of the pancreas appear stable, largest measuring 3.5 x 2.9 cm. Stable pancreatic calcification also noted. Suspect changes of chronic pancreatitis. Pancreas essentially unchanged in appearance compared to the 2015 study. Spleen: No splenic lesions are evident. Adrenals/Urinary Tract: Adrenals bilaterally appear unremarkable. There is a cyst arising from the upper pole of the right kidney measuring 5.6 x 4.6 cm. Left kidney absent. No lesions seen in the left pararenal fossa. There is no right-sided hydronephrosis. No renal or ureteral calculus evident on the right. Urinary bladder is midline with wall thickness within normal limits. Note that artifact from total hip prostheses makes evaluation of the bladder somewhat less than optimal. Stomach/Bowel: Rectum is distended with stool. There is artifact from the total hip replacements making evaluation of the rectum somewhat less than optimal. There does appear to be soft tissue thickening in the perirectal region. There are  sigmoid diverticula without diverticulitis. Elsewhere, there is no appreciable bowel wall or mesenteric thickening. There is no appreciable bowel obstruction. Terminal ileal region appears unremarkable. There is no evident free air or portal venous air. Vascular/Lymphatic: There is aortic and iliac artery atherosclerosis. No aneurysm evident. No adenopathy is appreciable in the abdomen or pelvis. Reproductive: Uterus absent.  No pelvic mass. Other: No periappendiceal region inflammation. No abscess or ascites evident in the abdomen or pelvis. Musculoskeletal: Total hip replacements bilaterally. Lumbar levoscoliosis noted. No blastic or lytic bone lesions. Spinal stenosis noted at L4-5 due to severe bony hypertrophy and diffuse disc protrusion. No intramuscular or abdominal wall lesions are appreciable. IMPRESSION: 1. Rectum is distended with stool. Soft tissue thickening in the perirectal region raises concern for a degree of stercoral proctitis. 2. Fairly small pleural effusions bilaterally with a degree of consolidation and compressive atelectasis in the lung bases. 3. Evidence suggesting chronic pancreatitis, with pancreas essentially stable in appearance compared to prior study from 2015. A cystadenoma in the body of the pancreas, stable since the 2015 study, is a possibility, potentially superimposed on pancreatitis. 4. Gallbladder is mildly distended with small gallstones. No gallbladder wall thickening evident by CT. 5. Absent left kidney. No right renal hydronephrosis. No renal or ureteral calculus on the right. Urinary bladder wall thickness normal. 6. Sigmoid diverticula without diverticulitis. No bowel obstruction. No abscess in the abdomen or pelvis. No periappendiceal region inflammatory change. 7. Aortic Atherosclerosis (ICD10-I70.0). There are foci of  coronary artery and pelvic arterial vascular calcification. 8.  Small pericardial effusion. 9. Spinal stenosis at L4-5 due to disc protrusion and bony  hypertrophy. 10.  Total hip replacements bilaterally. 11.  Uterus absent. Electronically Signed   By: Lowella Grip III M.D.   On: 08/23/2020 14:18   US Abdomen Limited RUQ  Result Date: 08/24/2020 CLINICAL DATA:  Abdominal pain EXAM: ULTRASOUND ABDOMEN LIMITED RIGHT UPPER QUADRANT COMPARISON:  CT 08/23/2020 FINDINGS: Gallbladder: There is a small amount of sludge and tiny stones layering dependently within the gallbladder lumen. No wall thickening or pericholecystic fluid visualized. No sonographic Murphy sign noted by sonographer. Common bile duct: Diameter: 4 mm. Liver: Simple cyst within the right hepatic lobe adjacent to the gallbladder fossa measuring up to 4.0 cm. Within normal limits in parenchymal echogenicity. Portal vein is patent on color Doppler imaging with normal direction of blood flow towards the liver. Other: Small right-sided pleural effusion is seen. Incidental note of simple 4.9 cm upper pole right renal cyst. IMPRESSION: 1. Cholelithiasis and a small amount of biliary sludge. No sonographic evidence of cholecystitis. 2. Small right pleural effusion. 3. Simple cyst noted within the right hepatic lobe as well as within the right kidney. Electronically Signed   By: Davina Poke D.O.   On: 08/24/2020 08:32     Management plans discussed with the patient, family and they are in agreement.  CODE STATUS:     Code Status Orders  (From admission, onward)         Start     Ordered   08/21/20 0340  Full code  Continuous        08/21/20 0341        Code Status History    Date Active Date Inactive Code Status Order ID Comments User Context   10/20/2018 0303 10/21/2018 2252 Full Code [id number]  Arta Silence, MD Inpatient   08/14/2018 2245 08/16/2018 1829 Partial Code [id number]  Sela Hua, MD ED   06/26/2018 1128 06/29/2018 2059 Full Code [id number]  Gladstone Lighter, MD Inpatient   04/30/2018 1446 05/03/2018 1507 Full Code [id number]  Saundra Shelling, MD Inpatient    03/15/2018 2153 03/17/2018 1803 Full Code [id number]  Lance Coon, MD Inpatient   12/17/2016 0125 12/19/2016 1856 Full Code [id number]  Theodoro Grist, MD Inpatient   08/11/2016 0324 08/11/2016 0937 Full Code [id number]  Lance Coon, MD Inpatient   03/29/2015 2115 03/30/2015 1555 Full Code [id number]  Nicholes Mango, MD Inpatient   Advance Care Planning Activity      TOTAL TIME TAKING CARE OF THIS PATIENT: 35 minutes.    Loletha Grayer M.D on 08/24/2020 at 2:13 PM  Between 7am to 6pm - Pager - [phone]  After 6pm go to [email or web address] - password [email or web address]  [facility]  CC: Primary care physician; [name], MD

## 2020-08-25 LAB — CULTURE, BLOOD (ROUTINE X 2)
Culture: NO GROWTH
Special Requests: ADEQUATE

## 2020-08-26 LAB — CULTURE, BLOOD (ROUTINE X 2)
Culture: NO GROWTH
Special Requests: ADEQUATE

## 2020-08-27 DIAGNOSIS — K861 Other chronic pancreatitis: Secondary | ICD-10-CM | POA: Diagnosis not present

## 2020-08-27 DIAGNOSIS — R627 Adult failure to thrive: Secondary | ICD-10-CM | POA: Diagnosis not present

## 2020-08-27 DIAGNOSIS — N390 Urinary tract infection, site not specified: Secondary | ICD-10-CM | POA: Diagnosis not present

## 2020-08-27 DIAGNOSIS — I10 Essential (primary) hypertension: Secondary | ICD-10-CM | POA: Diagnosis not present

## 2020-08-27 DIAGNOSIS — E785 Hyperlipidemia, unspecified: Secondary | ICD-10-CM | POA: Diagnosis not present

## 2020-08-29 DIAGNOSIS — K861 Other chronic pancreatitis: Secondary | ICD-10-CM | POA: Diagnosis not present

## 2020-08-29 DIAGNOSIS — N390 Urinary tract infection, site not specified: Secondary | ICD-10-CM | POA: Diagnosis not present

## 2020-08-29 DIAGNOSIS — I82411 Acute embolism and thrombosis of right femoral vein: Secondary | ICD-10-CM | POA: Diagnosis not present

## 2020-09-06 DIAGNOSIS — E785 Hyperlipidemia, unspecified: Secondary | ICD-10-CM | POA: Diagnosis not present

## 2020-09-06 DIAGNOSIS — K861 Other chronic pancreatitis: Secondary | ICD-10-CM | POA: Diagnosis not present

## 2020-09-06 DIAGNOSIS — R627 Adult failure to thrive: Secondary | ICD-10-CM | POA: Diagnosis not present

## 2020-09-06 DIAGNOSIS — I10 Essential (primary) hypertension: Secondary | ICD-10-CM | POA: Diagnosis not present

## 2020-09-06 DIAGNOSIS — N390 Urinary tract infection, site not specified: Secondary | ICD-10-CM | POA: Diagnosis not present

## 2020-09-16 DIAGNOSIS — E785 Hyperlipidemia, unspecified: Secondary | ICD-10-CM | POA: Diagnosis not present

## 2020-09-16 DIAGNOSIS — R627 Adult failure to thrive: Secondary | ICD-10-CM | POA: Diagnosis not present

## 2020-09-16 DIAGNOSIS — K861 Other chronic pancreatitis: Secondary | ICD-10-CM | POA: Diagnosis not present

## 2020-09-16 DIAGNOSIS — I10 Essential (primary) hypertension: Secondary | ICD-10-CM | POA: Diagnosis not present

## 2020-09-22 DIAGNOSIS — E785 Hyperlipidemia, unspecified: Secondary | ICD-10-CM | POA: Diagnosis not present

## 2020-09-22 DIAGNOSIS — Z86718 Personal history of other venous thrombosis and embolism: Secondary | ICD-10-CM | POA: Diagnosis not present

## 2020-09-22 DIAGNOSIS — M353 Polymyalgia rheumatica: Secondary | ICD-10-CM | POA: Diagnosis not present

## 2020-09-22 DIAGNOSIS — I10 Essential (primary) hypertension: Secondary | ICD-10-CM | POA: Diagnosis not present

## 2020-09-22 DIAGNOSIS — M6281 Muscle weakness (generalized): Secondary | ICD-10-CM | POA: Diagnosis not present

## 2020-09-22 DIAGNOSIS — F3289 Other specified depressive episodes: Secondary | ICD-10-CM | POA: Diagnosis not present

## 2020-09-22 DIAGNOSIS — K861 Other chronic pancreatitis: Secondary | ICD-10-CM | POA: Diagnosis not present

## 2020-09-22 DIAGNOSIS — L89153 Pressure ulcer of sacral region, stage 3: Secondary | ICD-10-CM | POA: Diagnosis not present

## 2020-09-26 ENCOUNTER — Other Ambulatory Visit: Payer: Self-pay

## 2020-09-26 NOTE — Patient Outreach (Signed)
[facility]) Care Management  09/26/2020  Tracy Mcmahon 01/04/1929 [id number]   Referral Date: 09/26/20 Referral Source: [employer] Report Date of Discharge: 09/24/20 Facility:  [facility] [place]: [facility]   Referral received.  No outreach warranted at this time.  Transition of Care calls being completed via EMMI. RN CM will outreach patient for any red flags received.    Plan: RN CM will close case.    Jone Baseman, RN, MSN [facility] Care Management Care Management Coordinator Direct Line [phone] Toll Free: [phone]  Fax: [phone]

## 2020-09-29 DIAGNOSIS — R488 Other symbolic dysfunctions: Secondary | ICD-10-CM | POA: Diagnosis not present

## 2020-10-04 DIAGNOSIS — R488 Other symbolic dysfunctions: Secondary | ICD-10-CM | POA: Diagnosis not present

## 2020-10-16 DIAGNOSIS — M6281 Muscle weakness (generalized): Secondary | ICD-10-CM | POA: Diagnosis not present

## 2020-10-16 DIAGNOSIS — L89153 Pressure ulcer of sacral region, stage 3: Secondary | ICD-10-CM | POA: Diagnosis not present

## 2020-10-16 DIAGNOSIS — K861 Other chronic pancreatitis: Secondary | ICD-10-CM | POA: Diagnosis not present

## 2020-10-16 DIAGNOSIS — R300 Dysuria: Secondary | ICD-10-CM | POA: Diagnosis not present

## 2020-10-16 DIAGNOSIS — I10 Essential (primary) hypertension: Secondary | ICD-10-CM | POA: Diagnosis not present

## 2020-10-19 DIAGNOSIS — N390 Urinary tract infection, site not specified: Secondary | ICD-10-CM | POA: Diagnosis not present

## 2020-10-20 DIAGNOSIS — M6281 Muscle weakness (generalized): Secondary | ICD-10-CM | POA: Diagnosis not present

## 2020-10-20 DIAGNOSIS — I10 Essential (primary) hypertension: Secondary | ICD-10-CM | POA: Diagnosis not present

## 2020-10-20 DIAGNOSIS — N390 Urinary tract infection, site not specified: Secondary | ICD-10-CM | POA: Diagnosis not present

## 2020-10-20 DIAGNOSIS — L89153 Pressure ulcer of sacral region, stage 3: Secondary | ICD-10-CM | POA: Diagnosis not present

## 2020-10-25 DIAGNOSIS — N183 Chronic kidney disease, stage 3 unspecified: Secondary | ICD-10-CM | POA: Diagnosis not present

## 2020-10-25 DIAGNOSIS — K861 Other chronic pancreatitis: Secondary | ICD-10-CM | POA: Diagnosis not present

## 2020-10-25 DIAGNOSIS — I129 Hypertensive chronic kidney disease with stage 1 through stage 4 chronic kidney disease, or unspecified chronic kidney disease: Secondary | ICD-10-CM | POA: Diagnosis not present

## 2020-10-25 DIAGNOSIS — G9341 Metabolic encephalopathy: Secondary | ICD-10-CM | POA: Diagnosis not present

## 2020-10-25 DIAGNOSIS — R1084 Generalized abdominal pain: Secondary | ICD-10-CM | POA: Diagnosis not present

## 2020-10-25 DIAGNOSIS — R140 Abdominal distension (gaseous): Secondary | ICD-10-CM | POA: Diagnosis not present

## 2020-10-25 DIAGNOSIS — F0631 Mood disorder due to known physiological condition with depressive features: Secondary | ICD-10-CM | POA: Diagnosis not present

## 2020-10-26 DIAGNOSIS — I10 Essential (primary) hypertension: Secondary | ICD-10-CM | POA: Diagnosis not present

## 2020-10-27 DIAGNOSIS — I10 Essential (primary) hypertension: Secondary | ICD-10-CM | POA: Diagnosis not present

## 2020-10-27 DIAGNOSIS — N390 Urinary tract infection, site not specified: Secondary | ICD-10-CM | POA: Diagnosis not present

## 2020-10-30 DIAGNOSIS — R1084 Generalized abdominal pain: Secondary | ICD-10-CM | POA: Diagnosis not present

## 2020-10-30 DIAGNOSIS — I129 Hypertensive chronic kidney disease with stage 1 through stage 4 chronic kidney disease, or unspecified chronic kidney disease: Secondary | ICD-10-CM | POA: Diagnosis not present

## 2020-10-30 DIAGNOSIS — N390 Urinary tract infection, site not specified: Secondary | ICD-10-CM | POA: Diagnosis not present

## 2020-10-30 DIAGNOSIS — F0631 Mood disorder due to known physiological condition with depressive features: Secondary | ICD-10-CM | POA: Diagnosis not present

## 2020-11-01 DIAGNOSIS — L600 Ingrowing nail: Secondary | ICD-10-CM | POA: Diagnosis not present

## 2020-11-01 DIAGNOSIS — Z794 Long term (current) use of insulin: Secondary | ICD-10-CM | POA: Diagnosis not present

## 2020-11-01 DIAGNOSIS — E1140 Type 2 diabetes mellitus with diabetic neuropathy, unspecified: Secondary | ICD-10-CM | POA: Diagnosis not present

## 2020-11-01 DIAGNOSIS — B351 Tinea unguium: Secondary | ICD-10-CM | POA: Diagnosis not present

## 2020-11-21 DIAGNOSIS — I82411 Acute embolism and thrombosis of right femoral vein: Secondary | ICD-10-CM | POA: Diagnosis not present

## 2020-11-21 DIAGNOSIS — R627 Adult failure to thrive: Secondary | ICD-10-CM | POA: Diagnosis not present

## 2020-11-21 DIAGNOSIS — R262 Difficulty in walking, not elsewhere classified: Secondary | ICD-10-CM | POA: Diagnosis not present

## 2020-11-21 DIAGNOSIS — I10 Essential (primary) hypertension: Secondary | ICD-10-CM | POA: Diagnosis not present

## 2020-11-21 DIAGNOSIS — E569 Vitamin deficiency, unspecified: Secondary | ICD-10-CM | POA: Diagnosis not present

## 2020-11-21 DIAGNOSIS — Z20822 Contact with and (suspected) exposure to covid-19: Secondary | ICD-10-CM | POA: Diagnosis not present

## 2020-11-21 DIAGNOSIS — E785 Hyperlipidemia, unspecified: Secondary | ICD-10-CM | POA: Diagnosis not present

## 2020-11-21 DIAGNOSIS — F0631 Mood disorder due to known physiological condition with depressive features: Secondary | ICD-10-CM | POA: Diagnosis not present

## 2020-11-21 DIAGNOSIS — M6281 Muscle weakness (generalized): Secondary | ICD-10-CM | POA: Diagnosis not present

## 2020-11-22 DIAGNOSIS — M810 Age-related osteoporosis without current pathological fracture: Secondary | ICD-10-CM | POA: Diagnosis not present

## 2020-11-22 DIAGNOSIS — G4700 Insomnia, unspecified: Secondary | ICD-10-CM | POA: Diagnosis not present

## 2020-11-22 DIAGNOSIS — E785 Hyperlipidemia, unspecified: Secondary | ICD-10-CM | POA: Diagnosis not present

## 2020-11-22 DIAGNOSIS — E118 Type 2 diabetes mellitus with unspecified complications: Secondary | ICD-10-CM | POA: Diagnosis not present

## 2020-11-22 DIAGNOSIS — F0390 Unspecified dementia without behavioral disturbance: Secondary | ICD-10-CM | POA: Diagnosis not present

## 2020-11-22 DIAGNOSIS — M6281 Muscle weakness (generalized): Secondary | ICD-10-CM | POA: Diagnosis not present

## 2020-11-22 DIAGNOSIS — M353 Polymyalgia rheumatica: Secondary | ICD-10-CM | POA: Diagnosis not present

## 2020-11-22 DIAGNOSIS — I129 Hypertensive chronic kidney disease with stage 1 through stage 4 chronic kidney disease, or unspecified chronic kidney disease: Secondary | ICD-10-CM | POA: Diagnosis not present

## 2020-11-22 DIAGNOSIS — K861 Other chronic pancreatitis: Secondary | ICD-10-CM | POA: Diagnosis not present

## 2020-11-23 DIAGNOSIS — E569 Vitamin deficiency, unspecified: Secondary | ICD-10-CM | POA: Diagnosis not present

## 2020-11-23 DIAGNOSIS — R262 Difficulty in walking, not elsewhere classified: Secondary | ICD-10-CM | POA: Diagnosis not present

## 2020-11-23 DIAGNOSIS — I82411 Acute embolism and thrombosis of right femoral vein: Secondary | ICD-10-CM | POA: Diagnosis not present

## 2020-11-23 DIAGNOSIS — Z20822 Contact with and (suspected) exposure to covid-19: Secondary | ICD-10-CM | POA: Diagnosis not present

## 2020-11-23 DIAGNOSIS — I10 Essential (primary) hypertension: Secondary | ICD-10-CM | POA: Diagnosis not present

## 2020-11-23 DIAGNOSIS — R627 Adult failure to thrive: Secondary | ICD-10-CM | POA: Diagnosis not present

## 2020-11-23 DIAGNOSIS — M6281 Muscle weakness (generalized): Secondary | ICD-10-CM | POA: Diagnosis not present

## 2020-11-23 DIAGNOSIS — E785 Hyperlipidemia, unspecified: Secondary | ICD-10-CM | POA: Diagnosis not present

## 2020-11-23 DIAGNOSIS — F0631 Mood disorder due to known physiological condition with depressive features: Secondary | ICD-10-CM | POA: Diagnosis not present

## 2020-11-24 DIAGNOSIS — F0631 Mood disorder due to known physiological condition with depressive features: Secondary | ICD-10-CM | POA: Diagnosis not present

## 2020-11-24 DIAGNOSIS — Z20822 Contact with and (suspected) exposure to covid-19: Secondary | ICD-10-CM | POA: Diagnosis not present

## 2020-11-24 DIAGNOSIS — E569 Vitamin deficiency, unspecified: Secondary | ICD-10-CM | POA: Diagnosis not present

## 2020-11-24 DIAGNOSIS — E785 Hyperlipidemia, unspecified: Secondary | ICD-10-CM | POA: Diagnosis not present

## 2020-11-24 DIAGNOSIS — R262 Difficulty in walking, not elsewhere classified: Secondary | ICD-10-CM | POA: Diagnosis not present

## 2020-11-24 DIAGNOSIS — M6281 Muscle weakness (generalized): Secondary | ICD-10-CM | POA: Diagnosis not present

## 2020-11-24 DIAGNOSIS — R627 Adult failure to thrive: Secondary | ICD-10-CM | POA: Diagnosis not present

## 2020-11-24 DIAGNOSIS — I10 Essential (primary) hypertension: Secondary | ICD-10-CM | POA: Diagnosis not present

## 2020-11-24 DIAGNOSIS — I82411 Acute embolism and thrombosis of right femoral vein: Secondary | ICD-10-CM | POA: Diagnosis not present

## 2020-11-25 DIAGNOSIS — R262 Difficulty in walking, not elsewhere classified: Secondary | ICD-10-CM | POA: Diagnosis not present

## 2020-11-25 DIAGNOSIS — E785 Hyperlipidemia, unspecified: Secondary | ICD-10-CM | POA: Diagnosis not present

## 2020-11-25 DIAGNOSIS — I10 Essential (primary) hypertension: Secondary | ICD-10-CM | POA: Diagnosis not present

## 2020-11-25 DIAGNOSIS — Z20822 Contact with and (suspected) exposure to covid-19: Secondary | ICD-10-CM | POA: Diagnosis not present

## 2020-11-25 DIAGNOSIS — M6281 Muscle weakness (generalized): Secondary | ICD-10-CM | POA: Diagnosis not present

## 2020-11-25 DIAGNOSIS — F0631 Mood disorder due to known physiological condition with depressive features: Secondary | ICD-10-CM | POA: Diagnosis not present

## 2020-11-25 DIAGNOSIS — R627 Adult failure to thrive: Secondary | ICD-10-CM | POA: Diagnosis not present

## 2020-11-25 DIAGNOSIS — E569 Vitamin deficiency, unspecified: Secondary | ICD-10-CM | POA: Diagnosis not present

## 2020-11-25 DIAGNOSIS — I82411 Acute embolism and thrombosis of right femoral vein: Secondary | ICD-10-CM | POA: Diagnosis not present

## 2020-11-26 DIAGNOSIS — E785 Hyperlipidemia, unspecified: Secondary | ICD-10-CM | POA: Diagnosis not present

## 2020-11-26 DIAGNOSIS — R262 Difficulty in walking, not elsewhere classified: Secondary | ICD-10-CM | POA: Diagnosis not present

## 2020-11-26 DIAGNOSIS — Z20822 Contact with and (suspected) exposure to covid-19: Secondary | ICD-10-CM | POA: Diagnosis not present

## 2020-11-26 DIAGNOSIS — I10 Essential (primary) hypertension: Secondary | ICD-10-CM | POA: Diagnosis not present

## 2020-11-26 DIAGNOSIS — F0631 Mood disorder due to known physiological condition with depressive features: Secondary | ICD-10-CM | POA: Diagnosis not present

## 2020-11-26 DIAGNOSIS — M6281 Muscle weakness (generalized): Secondary | ICD-10-CM | POA: Diagnosis not present

## 2020-11-26 DIAGNOSIS — E569 Vitamin deficiency, unspecified: Secondary | ICD-10-CM | POA: Diagnosis not present

## 2020-11-26 DIAGNOSIS — I82411 Acute embolism and thrombosis of right femoral vein: Secondary | ICD-10-CM | POA: Diagnosis not present

## 2020-11-26 DIAGNOSIS — R627 Adult failure to thrive: Secondary | ICD-10-CM | POA: Diagnosis not present

## 2020-11-27 DIAGNOSIS — M6281 Muscle weakness (generalized): Secondary | ICD-10-CM | POA: Diagnosis not present

## 2020-11-27 DIAGNOSIS — R262 Difficulty in walking, not elsewhere classified: Secondary | ICD-10-CM | POA: Diagnosis not present

## 2020-11-27 DIAGNOSIS — F0631 Mood disorder due to known physiological condition with depressive features: Secondary | ICD-10-CM | POA: Diagnosis not present

## 2020-11-27 DIAGNOSIS — E569 Vitamin deficiency, unspecified: Secondary | ICD-10-CM | POA: Diagnosis not present

## 2020-11-27 DIAGNOSIS — E785 Hyperlipidemia, unspecified: Secondary | ICD-10-CM | POA: Diagnosis not present

## 2020-11-27 DIAGNOSIS — R627 Adult failure to thrive: Secondary | ICD-10-CM | POA: Diagnosis not present

## 2020-11-27 DIAGNOSIS — Z20822 Contact with and (suspected) exposure to covid-19: Secondary | ICD-10-CM | POA: Diagnosis not present

## 2020-11-27 DIAGNOSIS — I82411 Acute embolism and thrombosis of right femoral vein: Secondary | ICD-10-CM | POA: Diagnosis not present

## 2020-11-27 DIAGNOSIS — I10 Essential (primary) hypertension: Secondary | ICD-10-CM | POA: Diagnosis not present

## 2020-11-28 DIAGNOSIS — Z20822 Contact with and (suspected) exposure to covid-19: Secondary | ICD-10-CM | POA: Diagnosis not present

## 2020-11-28 DIAGNOSIS — E569 Vitamin deficiency, unspecified: Secondary | ICD-10-CM | POA: Diagnosis not present

## 2020-11-28 DIAGNOSIS — U071 COVID-19: Secondary | ICD-10-CM | POA: Diagnosis not present

## 2020-11-28 DIAGNOSIS — E785 Hyperlipidemia, unspecified: Secondary | ICD-10-CM | POA: Diagnosis not present

## 2020-11-28 DIAGNOSIS — R262 Difficulty in walking, not elsewhere classified: Secondary | ICD-10-CM | POA: Diagnosis not present

## 2020-11-28 DIAGNOSIS — R627 Adult failure to thrive: Secondary | ICD-10-CM | POA: Diagnosis not present

## 2020-11-28 DIAGNOSIS — M6281 Muscle weakness (generalized): Secondary | ICD-10-CM | POA: Diagnosis not present

## 2020-11-28 DIAGNOSIS — I129 Hypertensive chronic kidney disease with stage 1 through stage 4 chronic kidney disease, or unspecified chronic kidney disease: Secondary | ICD-10-CM | POA: Diagnosis not present

## 2020-11-28 DIAGNOSIS — R059 Cough, unspecified: Secondary | ICD-10-CM | POA: Diagnosis not present

## 2020-11-28 DIAGNOSIS — F0631 Mood disorder due to known physiological condition with depressive features: Secondary | ICD-10-CM | POA: Diagnosis not present

## 2020-11-28 DIAGNOSIS — I10 Essential (primary) hypertension: Secondary | ICD-10-CM | POA: Diagnosis not present

## 2020-11-28 DIAGNOSIS — K5900 Constipation, unspecified: Secondary | ICD-10-CM | POA: Diagnosis not present

## 2020-11-28 DIAGNOSIS — I82411 Acute embolism and thrombosis of right femoral vein: Secondary | ICD-10-CM | POA: Diagnosis not present

## 2020-11-29 DIAGNOSIS — I10 Essential (primary) hypertension: Secondary | ICD-10-CM | POA: Diagnosis not present

## 2020-11-29 DIAGNOSIS — E785 Hyperlipidemia, unspecified: Secondary | ICD-10-CM | POA: Diagnosis not present

## 2020-11-29 DIAGNOSIS — I82411 Acute embolism and thrombosis of right femoral vein: Secondary | ICD-10-CM | POA: Diagnosis not present

## 2020-11-29 DIAGNOSIS — F0631 Mood disorder due to known physiological condition with depressive features: Secondary | ICD-10-CM | POA: Diagnosis not present

## 2020-11-29 DIAGNOSIS — R262 Difficulty in walking, not elsewhere classified: Secondary | ICD-10-CM | POA: Diagnosis not present

## 2020-11-29 DIAGNOSIS — E569 Vitamin deficiency, unspecified: Secondary | ICD-10-CM | POA: Diagnosis not present

## 2020-11-29 DIAGNOSIS — M6281 Muscle weakness (generalized): Secondary | ICD-10-CM | POA: Diagnosis not present

## 2020-11-29 DIAGNOSIS — Z20822 Contact with and (suspected) exposure to covid-19: Secondary | ICD-10-CM | POA: Diagnosis not present

## 2020-11-29 DIAGNOSIS — R627 Adult failure to thrive: Secondary | ICD-10-CM | POA: Diagnosis not present

## 2020-11-30 DIAGNOSIS — R262 Difficulty in walking, not elsewhere classified: Secondary | ICD-10-CM | POA: Diagnosis not present

## 2020-11-30 DIAGNOSIS — Z20822 Contact with and (suspected) exposure to covid-19: Secondary | ICD-10-CM | POA: Diagnosis not present

## 2020-11-30 DIAGNOSIS — E569 Vitamin deficiency, unspecified: Secondary | ICD-10-CM | POA: Diagnosis not present

## 2020-11-30 DIAGNOSIS — E785 Hyperlipidemia, unspecified: Secondary | ICD-10-CM | POA: Diagnosis not present

## 2020-11-30 DIAGNOSIS — F0631 Mood disorder due to known physiological condition with depressive features: Secondary | ICD-10-CM | POA: Diagnosis not present

## 2020-11-30 DIAGNOSIS — R627 Adult failure to thrive: Secondary | ICD-10-CM | POA: Diagnosis not present

## 2020-11-30 DIAGNOSIS — I82411 Acute embolism and thrombosis of right femoral vein: Secondary | ICD-10-CM | POA: Diagnosis not present

## 2020-11-30 DIAGNOSIS — M6281 Muscle weakness (generalized): Secondary | ICD-10-CM | POA: Diagnosis not present

## 2020-11-30 DIAGNOSIS — I10 Essential (primary) hypertension: Secondary | ICD-10-CM | POA: Diagnosis not present

## 2020-12-01 DIAGNOSIS — I10 Essential (primary) hypertension: Secondary | ICD-10-CM | POA: Diagnosis not present

## 2020-12-01 DIAGNOSIS — R627 Adult failure to thrive: Secondary | ICD-10-CM | POA: Diagnosis not present

## 2020-12-01 DIAGNOSIS — Z20822 Contact with and (suspected) exposure to covid-19: Secondary | ICD-10-CM | POA: Diagnosis not present

## 2020-12-01 DIAGNOSIS — E785 Hyperlipidemia, unspecified: Secondary | ICD-10-CM | POA: Diagnosis not present

## 2020-12-01 DIAGNOSIS — M6281 Muscle weakness (generalized): Secondary | ICD-10-CM | POA: Diagnosis not present

## 2020-12-01 DIAGNOSIS — R262 Difficulty in walking, not elsewhere classified: Secondary | ICD-10-CM | POA: Diagnosis not present

## 2020-12-01 DIAGNOSIS — E569 Vitamin deficiency, unspecified: Secondary | ICD-10-CM | POA: Diagnosis not present

## 2020-12-01 DIAGNOSIS — I82411 Acute embolism and thrombosis of right femoral vein: Secondary | ICD-10-CM | POA: Diagnosis not present

## 2020-12-01 DIAGNOSIS — F0631 Mood disorder due to known physiological condition with depressive features: Secondary | ICD-10-CM | POA: Diagnosis not present

## 2020-12-04 DIAGNOSIS — R627 Adult failure to thrive: Secondary | ICD-10-CM | POA: Diagnosis not present

## 2020-12-04 DIAGNOSIS — I10 Essential (primary) hypertension: Secondary | ICD-10-CM | POA: Diagnosis not present

## 2020-12-04 DIAGNOSIS — M6281 Muscle weakness (generalized): Secondary | ICD-10-CM | POA: Diagnosis not present

## 2020-12-04 DIAGNOSIS — F0631 Mood disorder due to known physiological condition with depressive features: Secondary | ICD-10-CM | POA: Diagnosis not present

## 2020-12-04 DIAGNOSIS — Z20822 Contact with and (suspected) exposure to covid-19: Secondary | ICD-10-CM | POA: Diagnosis not present

## 2020-12-04 DIAGNOSIS — R262 Difficulty in walking, not elsewhere classified: Secondary | ICD-10-CM | POA: Diagnosis not present

## 2020-12-04 DIAGNOSIS — I82411 Acute embolism and thrombosis of right femoral vein: Secondary | ICD-10-CM | POA: Diagnosis not present

## 2020-12-04 DIAGNOSIS — E785 Hyperlipidemia, unspecified: Secondary | ICD-10-CM | POA: Diagnosis not present

## 2020-12-04 DIAGNOSIS — E569 Vitamin deficiency, unspecified: Secondary | ICD-10-CM | POA: Diagnosis not present

## 2020-12-05 DIAGNOSIS — R627 Adult failure to thrive: Secondary | ICD-10-CM | POA: Diagnosis not present

## 2020-12-05 DIAGNOSIS — E785 Hyperlipidemia, unspecified: Secondary | ICD-10-CM | POA: Diagnosis not present

## 2020-12-05 DIAGNOSIS — Z20822 Contact with and (suspected) exposure to covid-19: Secondary | ICD-10-CM | POA: Diagnosis not present

## 2020-12-05 DIAGNOSIS — F0631 Mood disorder due to known physiological condition with depressive features: Secondary | ICD-10-CM | POA: Diagnosis not present

## 2020-12-05 DIAGNOSIS — E569 Vitamin deficiency, unspecified: Secondary | ICD-10-CM | POA: Diagnosis not present

## 2020-12-05 DIAGNOSIS — I10 Essential (primary) hypertension: Secondary | ICD-10-CM | POA: Diagnosis not present

## 2020-12-05 DIAGNOSIS — I82411 Acute embolism and thrombosis of right femoral vein: Secondary | ICD-10-CM | POA: Diagnosis not present

## 2020-12-05 DIAGNOSIS — M6281 Muscle weakness (generalized): Secondary | ICD-10-CM | POA: Diagnosis not present

## 2020-12-05 DIAGNOSIS — G4701 Insomnia due to medical condition: Secondary | ICD-10-CM | POA: Diagnosis not present

## 2020-12-05 DIAGNOSIS — R262 Difficulty in walking, not elsewhere classified: Secondary | ICD-10-CM | POA: Diagnosis not present

## 2020-12-06 DIAGNOSIS — M6281 Muscle weakness (generalized): Secondary | ICD-10-CM | POA: Diagnosis not present

## 2020-12-06 DIAGNOSIS — I10 Essential (primary) hypertension: Secondary | ICD-10-CM | POA: Diagnosis not present

## 2020-12-06 DIAGNOSIS — R262 Difficulty in walking, not elsewhere classified: Secondary | ICD-10-CM | POA: Diagnosis not present

## 2020-12-06 DIAGNOSIS — F0631 Mood disorder due to known physiological condition with depressive features: Secondary | ICD-10-CM | POA: Diagnosis not present

## 2020-12-06 DIAGNOSIS — E785 Hyperlipidemia, unspecified: Secondary | ICD-10-CM | POA: Diagnosis not present

## 2020-12-06 DIAGNOSIS — E569 Vitamin deficiency, unspecified: Secondary | ICD-10-CM | POA: Diagnosis not present

## 2020-12-06 DIAGNOSIS — I82411 Acute embolism and thrombosis of right femoral vein: Secondary | ICD-10-CM | POA: Diagnosis not present

## 2020-12-06 DIAGNOSIS — Z20822 Contact with and (suspected) exposure to covid-19: Secondary | ICD-10-CM | POA: Diagnosis not present

## 2020-12-06 DIAGNOSIS — R627 Adult failure to thrive: Secondary | ICD-10-CM | POA: Diagnosis not present

## 2020-12-12 DIAGNOSIS — E785 Hyperlipidemia, unspecified: Secondary | ICD-10-CM | POA: Diagnosis not present

## 2020-12-12 DIAGNOSIS — R262 Difficulty in walking, not elsewhere classified: Secondary | ICD-10-CM | POA: Diagnosis not present

## 2020-12-12 DIAGNOSIS — M6281 Muscle weakness (generalized): Secondary | ICD-10-CM | POA: Diagnosis not present

## 2020-12-12 DIAGNOSIS — Z20822 Contact with and (suspected) exposure to covid-19: Secondary | ICD-10-CM | POA: Diagnosis not present

## 2020-12-12 DIAGNOSIS — R600 Localized edema: Secondary | ICD-10-CM | POA: Diagnosis not present

## 2020-12-12 DIAGNOSIS — F0631 Mood disorder due to known physiological condition with depressive features: Secondary | ICD-10-CM | POA: Diagnosis not present

## 2020-12-12 DIAGNOSIS — I82411 Acute embolism and thrombosis of right femoral vein: Secondary | ICD-10-CM | POA: Diagnosis not present

## 2020-12-12 DIAGNOSIS — E569 Vitamin deficiency, unspecified: Secondary | ICD-10-CM | POA: Diagnosis not present

## 2020-12-12 DIAGNOSIS — R627 Adult failure to thrive: Secondary | ICD-10-CM | POA: Diagnosis not present

## 2020-12-12 DIAGNOSIS — I10 Essential (primary) hypertension: Secondary | ICD-10-CM | POA: Diagnosis not present

## 2020-12-13 DIAGNOSIS — R2243 Localized swelling, mass and lump, lower limb, bilateral: Secondary | ICD-10-CM | POA: Diagnosis not present

## 2020-12-14 DIAGNOSIS — I5020 Unspecified systolic (congestive) heart failure: Secondary | ICD-10-CM | POA: Diagnosis not present

## 2020-12-14 DIAGNOSIS — R262 Difficulty in walking, not elsewhere classified: Secondary | ICD-10-CM | POA: Diagnosis not present

## 2020-12-14 DIAGNOSIS — M6281 Muscle weakness (generalized): Secondary | ICD-10-CM | POA: Diagnosis not present

## 2020-12-14 DIAGNOSIS — F0631 Mood disorder due to known physiological condition with depressive features: Secondary | ICD-10-CM | POA: Diagnosis not present

## 2020-12-14 DIAGNOSIS — I82411 Acute embolism and thrombosis of right femoral vein: Secondary | ICD-10-CM | POA: Diagnosis not present

## 2020-12-14 DIAGNOSIS — R627 Adult failure to thrive: Secondary | ICD-10-CM | POA: Diagnosis not present

## 2020-12-14 DIAGNOSIS — E569 Vitamin deficiency, unspecified: Secondary | ICD-10-CM | POA: Diagnosis not present

## 2020-12-14 DIAGNOSIS — Z20822 Contact with and (suspected) exposure to covid-19: Secondary | ICD-10-CM | POA: Diagnosis not present

## 2020-12-14 DIAGNOSIS — E785 Hyperlipidemia, unspecified: Secondary | ICD-10-CM | POA: Diagnosis not present

## 2020-12-14 DIAGNOSIS — I10 Essential (primary) hypertension: Secondary | ICD-10-CM | POA: Diagnosis not present

## 2020-12-15 DIAGNOSIS — E569 Vitamin deficiency, unspecified: Secondary | ICD-10-CM | POA: Diagnosis not present

## 2020-12-15 DIAGNOSIS — I82411 Acute embolism and thrombosis of right femoral vein: Secondary | ICD-10-CM | POA: Diagnosis not present

## 2020-12-15 DIAGNOSIS — E785 Hyperlipidemia, unspecified: Secondary | ICD-10-CM | POA: Diagnosis not present

## 2020-12-15 DIAGNOSIS — I10 Essential (primary) hypertension: Secondary | ICD-10-CM | POA: Diagnosis not present

## 2020-12-15 DIAGNOSIS — Z20822 Contact with and (suspected) exposure to covid-19: Secondary | ICD-10-CM | POA: Diagnosis not present

## 2020-12-15 DIAGNOSIS — R262 Difficulty in walking, not elsewhere classified: Secondary | ICD-10-CM | POA: Diagnosis not present

## 2020-12-15 DIAGNOSIS — R627 Adult failure to thrive: Secondary | ICD-10-CM | POA: Diagnosis not present

## 2020-12-15 DIAGNOSIS — M6281 Muscle weakness (generalized): Secondary | ICD-10-CM | POA: Diagnosis not present

## 2020-12-15 DIAGNOSIS — F0631 Mood disorder due to known physiological condition with depressive features: Secondary | ICD-10-CM | POA: Diagnosis not present

## 2020-12-18 DIAGNOSIS — Z20822 Contact with and (suspected) exposure to covid-19: Secondary | ICD-10-CM | POA: Diagnosis not present

## 2020-12-18 DIAGNOSIS — R262 Difficulty in walking, not elsewhere classified: Secondary | ICD-10-CM | POA: Diagnosis not present

## 2020-12-18 DIAGNOSIS — E569 Vitamin deficiency, unspecified: Secondary | ICD-10-CM | POA: Diagnosis not present

## 2020-12-18 DIAGNOSIS — I82411 Acute embolism and thrombosis of right femoral vein: Secondary | ICD-10-CM | POA: Diagnosis not present

## 2020-12-18 DIAGNOSIS — R627 Adult failure to thrive: Secondary | ICD-10-CM | POA: Diagnosis not present

## 2020-12-18 DIAGNOSIS — M6281 Muscle weakness (generalized): Secondary | ICD-10-CM | POA: Diagnosis not present

## 2020-12-18 DIAGNOSIS — E785 Hyperlipidemia, unspecified: Secondary | ICD-10-CM | POA: Diagnosis not present

## 2020-12-18 DIAGNOSIS — F0631 Mood disorder due to known physiological condition with depressive features: Secondary | ICD-10-CM | POA: Diagnosis not present

## 2020-12-18 DIAGNOSIS — I10 Essential (primary) hypertension: Secondary | ICD-10-CM | POA: Diagnosis not present

## 2020-12-19 DIAGNOSIS — N6323 Unspecified lump in the left breast, lower outer quadrant: Secondary | ICD-10-CM | POA: Diagnosis not present

## 2020-12-19 DIAGNOSIS — B379 Candidiasis, unspecified: Secondary | ICD-10-CM | POA: Diagnosis not present

## 2020-12-19 DIAGNOSIS — F0391 Unspecified dementia with behavioral disturbance: Secondary | ICD-10-CM | POA: Diagnosis not present

## 2020-12-22 DIAGNOSIS — E118 Type 2 diabetes mellitus with unspecified complications: Secondary | ICD-10-CM | POA: Diagnosis not present

## 2020-12-22 DIAGNOSIS — F0390 Unspecified dementia without behavioral disturbance: Secondary | ICD-10-CM | POA: Diagnosis not present

## 2020-12-22 DIAGNOSIS — H6123 Impacted cerumen, bilateral: Secondary | ICD-10-CM | POA: Diagnosis not present

## 2020-12-22 DIAGNOSIS — E785 Hyperlipidemia, unspecified: Secondary | ICD-10-CM | POA: Diagnosis not present

## 2020-12-22 DIAGNOSIS — M6281 Muscle weakness (generalized): Secondary | ICD-10-CM | POA: Diagnosis not present

## 2020-12-22 DIAGNOSIS — N6323 Unspecified lump in the left breast, lower outer quadrant: Secondary | ICD-10-CM | POA: Diagnosis not present

## 2020-12-22 DIAGNOSIS — I129 Hypertensive chronic kidney disease with stage 1 through stage 4 chronic kidney disease, or unspecified chronic kidney disease: Secondary | ICD-10-CM | POA: Diagnosis not present

## 2020-12-22 DIAGNOSIS — M810 Age-related osteoporosis without current pathological fracture: Secondary | ICD-10-CM | POA: Diagnosis not present

## 2020-12-22 DIAGNOSIS — B379 Candidiasis, unspecified: Secondary | ICD-10-CM | POA: Diagnosis not present

## 2020-12-25 DIAGNOSIS — E559 Vitamin D deficiency, unspecified: Secondary | ICD-10-CM | POA: Diagnosis not present

## 2020-12-25 DIAGNOSIS — E119 Type 2 diabetes mellitus without complications: Secondary | ICD-10-CM | POA: Diagnosis not present

## 2020-12-28 DIAGNOSIS — E559 Vitamin D deficiency, unspecified: Secondary | ICD-10-CM | POA: Diagnosis not present

## 2020-12-28 DIAGNOSIS — E119 Type 2 diabetes mellitus without complications: Secondary | ICD-10-CM | POA: Diagnosis not present

## 2021-01-01 DIAGNOSIS — B379 Candidiasis, unspecified: Secondary | ICD-10-CM | POA: Diagnosis not present

## 2021-01-01 DIAGNOSIS — N6323 Unspecified lump in the left breast, lower outer quadrant: Secondary | ICD-10-CM | POA: Diagnosis not present

## 2021-01-01 DIAGNOSIS — M6281 Muscle weakness (generalized): Secondary | ICD-10-CM | POA: Diagnosis not present

## 2021-01-01 DIAGNOSIS — H6123 Impacted cerumen, bilateral: Secondary | ICD-10-CM | POA: Diagnosis not present

## 2021-01-02 DIAGNOSIS — F0391 Unspecified dementia with behavioral disturbance: Secondary | ICD-10-CM | POA: Diagnosis not present

## 2021-01-02 DIAGNOSIS — G4701 Insomnia due to medical condition: Secondary | ICD-10-CM | POA: Diagnosis not present

## 2021-01-02 DIAGNOSIS — F0390 Unspecified dementia without behavioral disturbance: Secondary | ICD-10-CM | POA: Diagnosis not present

## 2021-01-02 DIAGNOSIS — M6281 Muscle weakness (generalized): Secondary | ICD-10-CM | POA: Diagnosis not present

## 2021-01-02 DIAGNOSIS — F0631 Mood disorder due to known physiological condition with depressive features: Secondary | ICD-10-CM | POA: Diagnosis not present

## 2021-01-03 DIAGNOSIS — M6281 Muscle weakness (generalized): Secondary | ICD-10-CM | POA: Diagnosis not present

## 2021-01-03 DIAGNOSIS — H6123 Impacted cerumen, bilateral: Secondary | ICD-10-CM | POA: Diagnosis not present

## 2021-01-03 DIAGNOSIS — R600 Localized edema: Secondary | ICD-10-CM | POA: Diagnosis not present

## 2021-01-03 DIAGNOSIS — N6323 Unspecified lump in the left breast, lower outer quadrant: Secondary | ICD-10-CM | POA: Diagnosis not present

## 2021-01-04 ENCOUNTER — Inpatient Hospital Stay: Payer: Medicare HMO | Attending: Oncology | Admitting: Oncology

## 2021-01-04 ENCOUNTER — Encounter: Payer: Self-pay | Admitting: Oncology

## 2021-01-04 ENCOUNTER — Encounter (INDEPENDENT_AMBULATORY_CARE_PROVIDER_SITE_OTHER): Payer: Self-pay

## 2021-01-04 ENCOUNTER — Telehealth: Payer: Self-pay | Admitting: *Deleted

## 2021-01-04 ENCOUNTER — Inpatient Hospital Stay: Payer: Medicare HMO

## 2021-01-04 VITALS — BP 153/67 | HR 72 | Temp 97.4°F

## 2021-01-04 DIAGNOSIS — Z800 Family history of malignant neoplasm of digestive organs: Secondary | ICD-10-CM | POA: Diagnosis not present

## 2021-01-04 DIAGNOSIS — N6323 Unspecified lump in the left breast, lower outer quadrant: Secondary | ICD-10-CM | POA: Insufficient documentation

## 2021-01-04 DIAGNOSIS — Z993 Dependence on wheelchair: Secondary | ICD-10-CM | POA: Insufficient documentation

## 2021-01-04 DIAGNOSIS — L539 Erythematous condition, unspecified: Secondary | ICD-10-CM | POA: Diagnosis not present

## 2021-01-04 DIAGNOSIS — Z809 Family history of malignant neoplasm, unspecified: Secondary | ICD-10-CM | POA: Insufficient documentation

## 2021-01-04 DIAGNOSIS — F0390 Unspecified dementia without behavioral disturbance: Secondary | ICD-10-CM | POA: Diagnosis not present

## 2021-01-04 DIAGNOSIS — R2243 Localized swelling, mass and lump, lower limb, bilateral: Secondary | ICD-10-CM | POA: Diagnosis not present

## 2021-01-04 DIAGNOSIS — Z801 Family history of malignant neoplasm of trachea, bronchus and lung: Secondary | ICD-10-CM | POA: Insufficient documentation

## 2021-01-04 DIAGNOSIS — N6320 Unspecified lump in the left breast, unspecified quadrant: Secondary | ICD-10-CM

## 2021-01-04 DIAGNOSIS — R208 Other disturbances of skin sensation: Secondary | ICD-10-CM | POA: Diagnosis not present

## 2021-01-04 NOTE — Telephone Encounter (Signed)
Mammo - first available per 01/04/21 los. Dr. Tasia Catchings wanted pt to be sched per los.  I called [facility] and was told: (Per Roselyn Reef: that a mammogram couldn't be sched, until pt found out where her last mammo was done. Pt stated that she didn't remember and her daughter stated that she was  Unsure.  Roselyn Reef, stated that (pt) would have to reach out to her PCP to obtain reports... She said that he should have them.  I made the pt and her daughter aware of what needed to be done in detail (written down for them). Pts daugther said that she would try to get it taken care of and give the office a call back after she spoke with her mother's PCP.

## 2021-01-04 NOTE — Progress Notes (Signed)
Hematology/Oncology Consult note [facility] [phone] [phone]   Patient Care Team: [name], MD as PCP - General (Internal Medicine)  REFERRING PROVIDER: Jennette Bill., MD  CHIEF COMPLAINTS/REASON FOR VISIT:  Evaluation of left breast mass  HISTORY OF PRESENTING ILLNESS:   Tracy Mcmahon is a  87 y.o.  female with PMH listed below was seen in consultation at the request of  Livia Snellen, Ronnette Hila., MD  for evaluation of left breast mass Patient was sent to cancer center as provider reports left breast mass. Patient was accompanied by her daughter who is not sure about the reason of referral.  She thought that patient was here for evaluation of her left side mass. Patient has mild dementia, not able to provide much history.   Review of Systems  Unable to perform ROS: Dementia  HENT:   Positive for hearing loss.     MEDICAL HISTORY:  Past Medical History:  Diagnosis Date  . Cancer ([place])   . Cataract   . Chronic kidney disease (CKD), stage III (moderate) (HCC)   . Chronic urticaria   . COPD (chronic obstructive pulmonary disease) ([place])   . Depression   . Diabetes mellitus without complication ([place])   . DVT (deep venous thrombosis) (HCC)    left leg  . Hyperlipemia   . Hypertension   . Osteoarthritis   . Osteopenia   . Polymyalgia rheumatica ([place])   . Polyneuropathy   . Renal cancer ([place])   . Renal insufficiency 08/10/2016   Chart indicates CKD stage 3  . Restless leg syndrome   . Vitamin D deficiency   . Vulvar intraepithelial neoplasia with lichen sclerosus     SURGICAL HISTORY: Past Surgical History:  Procedure Laterality Date  . ABDOMINAL HYSTERECTOMY    . APPENDECTOMY    . CATARACT EXTRACTION    . COLONOSCOPY    . JOINT REPLACEMENT Bilateral    hip  . LAMINOTOMY    . NEPHRECTOMY    . TEMPORAL ARTERY BIOPSY / LIGATION    . TONSILLECTOMY      SOCIAL HISTORY: Social History   Socioeconomic  History  . Marital status: Widowed    Spouse name: Not on file  . Number of children: Not on file  . Years of education: Not on file  . Highest education level: Not on file  Occupational History  . Occupation: retired  Tobacco Use  . Smoking status: Never Smoker  . Smokeless tobacco: Never Used  Vaping Use  . Vaping Use: Never used  Substance and Sexual Activity  . Alcohol use: No    Alcohol/week: 0.0 standard drinks  . Drug use: No  . Sexual activity: Never    Birth control/protection: None  Other Topics Concern  . Not on file  Social History Narrative   Lives at home with son-in-law   Ambulates with a walker   Social Determinants of Health   Financial Resource Strain: Not on file  Food Insecurity: Not on file  Transportation Needs: Not on file  Physical Activity: Not on file  Stress: Not on file  Social Connections: Not on file  Intimate Partner Violence: Not on file    FAMILY HISTORY: Family History  Problem Relation Age of Onset  . Hypertension Other   . Diabetes Other   . Heart disease Mother   . Lung cancer Father   . Colon cancer Sister   . Colon cancer Brother   . Cancer Sister  ALLERGIES:  is allergic to fosamax [alendronate sodium] and wellbutrin [bupropion].  MEDICATIONS:  Current Outpatient Medications  Medication Sig Dispense Refill  . acetaminophen (TYLENOL) 325 MG tablet Take 650 mg by mouth every 4 (four) hours as needed.    Marland Kitchen atenolol (TENORMIN) 25 MG tablet Take 25 mg by mouth 2 (two) times daily.    . carbamide peroxide (DEBROX) 6.5 % OTIC solution Place 5 drops into both ears daily.    Marland Kitchen ELIQUIS 2.5 MG TABS tablet Take 2.5 mg by mouth 2 (two) times daily.    . lipase/protease/amylase (CREON) 36000 UNITS CPEP capsule Take 2 capsules (72,000 Units total) by mouth 3 (three) times daily before meals. 270 capsule 0  . mirtazapine (REMERON) 7.5 MG tablet Take 7.5 mg by mouth at bedtime.    Marland Kitchen nystatin (NYSTATIN) powder Apply 1 application  topically 2 (two) times daily.    . ondansetron (ZOFRAN) 4 MG tablet Take 1 tablet (4 mg total) by mouth every 6 (six) hours as needed for nausea. 20 tablet 0  . pravastatin (PRAVACHOL) 20 MG tablet Take 20 mg by mouth every evening.     . senna-docusate (SENNA-PLUS) 8.6-50 MG tablet Take 1 tablet by mouth 2 (two) times daily.    . traZODone (DESYREL) 50 MG tablet Take 1 tablet (50 mg total) by mouth at bedtime. 30 tablet 0  . Vitamin D, Ergocalciferol, (DRISDOL) 50000 UNITS CAPS capsule Take 50,000 Units by mouth every Sunday.     . gabapentin (NEURONTIN) 100 MG capsule Take 1 capsule (100 mg total) by mouth 2 (two) times daily. (Patient not taking: Reported on 01/04/2021) 60 capsule 0  . midodrine (PROAMATINE) 10 MG tablet Take 1 tablet (10 mg total) by mouth 3 (three) times daily with meals. (Patient not taking: Reported on 01/04/2021) 90 tablet 0   No current facility-administered medications for this visit.     PHYSICAL EXAMINATION: ECOG PERFORMANCE STATUS: 2 - Symptomatic, <50% confined to bed Vitals:   01/04/21 1349  BP: (!) 153/67  Pulse: 72  Temp: (!) 97.4 F (36.3 C)   There were no vitals filed for this visit.  Physical Exam Constitutional:      General: She is not in acute distress.    Comments: Patient sits in a wheelchair.  HENT:     Head: Normocephalic and atraumatic.  Eyes:     General: No scleral icterus. Cardiovascular:     Rate and Rhythm: Normal rate and regular rhythm.     Heart sounds: Normal heart sounds.  Pulmonary:     Effort: Pulmonary effort is normal. No respiratory distress.     Breath sounds: No wheezing.  Abdominal:     General: Bowel sounds are normal. There is no distension.     Palpations: Abdomen is soft.     Comments: Left side abdominal wall bulging  Musculoskeletal:        General: No deformity. Normal range of motion.     Cervical back: Normal range of motion and neck supple.  Skin:    General: Skin is warm and dry.     Findings: No  erythema or rash.  Neurological:     Mental Status: She is alert. Mental status is at baseline.     Cranial Nerves: No cranial nerve deficit.     Coordination: Coordination normal.     Comments: Hearing loss.   Psychiatric:        Mood and Affect: Mood normal.   Bilateral breast examination was performed  at the sitting position due to patient is stable to get up to the examination table.  I was not able to appreciate mass in bilateral breast.  No palpable lymphadenopathy bilaterally.Mild erythematous rash below both breast  LABORATORY DATA:  I have reviewed the data as listed Lab Results  Component Value Date   WBC 4.2 08/24/2020   HGB 9.4 (L) 08/24/2020   HCT 28.7 (L) 08/24/2020   MCV 84.4 08/24/2020   PLT 213 08/24/2020   Recent Labs    08/20/20 2207 08/21/20 0450 08/24/20 0550  NA 139 142 142  K 4.1 3.9 3.9  CL 99 105 109  CO2 30 27 26   GLUCOSE 124* 111* 102*  BUN 25* 22 12  CREATININE 1.17* 1.06* 1.05*  CALCIUM 9.0 8.6* 8.7*  GFRNONAA 41* 47* 47*  GFRAA 48* 54* 55*  PROT 6.6  --   --   ALBUMIN 3.1*  --   --   AST 17  --   --   ALT 12  --   --   ALKPHOS 70  --   --   BILITOT 0.4  --   --    Iron/TIBC/Ferritin/ %Sat No results found for: IRON, TIBC, FERRITIN, IRONPCTSAT    RADIOGRAPHIC STUDIES: I have personally reviewed the radiological images as listed and agreed with the findings in the report. No results found.    ASSESSMENT & PLAN:  1. Left breast mass    Left breast mass, lower outer quadrant was reported by Dr. Livia Snellen.  I was not able to appreciate the mass.  I will obtain diagnostic mammogram for further evaluation.  Discussed with patient and daughter who agree with the plan.  Left side abdominal wall bulging, possible ventral hernia.  Orders Placed This Encounter  Procedures  . MM DIAG BREAST TOMO BILATERAL    Standing Status:   Future    Standing Expiration Date:   01/04/2022    Order Specific Question:   Reason for Exam (SYMPTOM  OR  DIAGNOSIS REQUIRED)    Answer:   breast mass located in lower ouder quadrant of left breast, reported by PCP Dr. Livia Snellen    Order Specific Question:   Preferred imaging location?    Answer:   [facility]  . US Breast Limited [facility]    Standing Status:   Future    Standing Expiration Date:   01/04/2022    Order Specific Question:   Reason for Exam (SYMPTOM  OR DIAGNOSIS REQUIRED)    Answer:   breast mass located in lower ouder quadrant of left breast, reported by PCP Dr. Livia Snellen    Order Specific Question:   Preferred imaging location?    Answer:   [facility]  . US Breast Limited [facility] Right [facility] [place]    Standing Status:   Future    Standing Expiration Date:   01/04/2022    Order Specific Question:   Reason for Exam (SYMPTOM  OR DIAGNOSIS REQUIRED)    Answer:   breast mass located in lower ouder quadrant of left breast, reported by PCP Dr. Livia Snellen    Order Specific Question:   Preferred imaging location?    Answer:   [facility]    All questions were answered. The patient knows to call the clinic with any problems questions or concerns.  cc Livia Snellen Ronnette Hila., MD    Return of visit: To pending mammogram results. Thank you for this kind referral and the opportunity to participate in the  care of this patient. A copy of today's note is routed to referring provider    Earlie Server, MD, PhD Hematology Oncology [facility] Pager- [phone] 01/04/2021

## 2021-01-04 NOTE — Progress Notes (Signed)
Patient here to establish care  

## 2021-01-05 NOTE — Progress Notes (Signed)
Scheduled patient for diagnostic mammogram 12/11/20 at 1:30.  Patient lives at [facility].  Notified daughter [name] of appointment date and time.

## 2021-01-05 NOTE — Telephone Encounter (Signed)
I looked in the data archive [facility] copy EHR) and found multiple reports of mammograms at [facility] with last one 10/19/13.  Please let [facility] know it looks like she was getting them here.

## 2021-01-05 NOTE — Telephone Encounter (Signed)
Nurse navigator Madilyn Fireman, discussed patients mammogram history with [facility].  Patient has been scheduled for mammogram on 01/11/21 @ 1:40 and Webb Silversmith notified Ms. Keough daughter [name] of appt details.

## 2021-01-05 NOTE — Telephone Encounter (Signed)
  Fyi.. I called [facility] and per Melissa pt needs to come to there office to fill out a Medical release form so that they can obtain records and images before they can get her mammogram Sched. I called pt daughter [name] and made her aware. She stated that she would go by [facility] today Then give [place] a call back.

## 2021-01-09 DIAGNOSIS — H6123 Impacted cerumen, bilateral: Secondary | ICD-10-CM | POA: Diagnosis not present

## 2021-01-09 DIAGNOSIS — R600 Localized edema: Secondary | ICD-10-CM | POA: Diagnosis not present

## 2021-01-10 DIAGNOSIS — I10 Essential (primary) hypertension: Secondary | ICD-10-CM | POA: Diagnosis not present

## 2021-01-11 ENCOUNTER — Other Ambulatory Visit: Payer: Self-pay

## 2021-01-11 ENCOUNTER — Ambulatory Visit
Admission: RE | Admit: 2021-01-11 | Discharge: 2021-01-11 | Disposition: A | Discharge status: Home / Self Care | Payer: Medicare HMO | Source: Ambulatory Visit | Attending: Oncology | Admitting: Oncology

## 2021-01-11 DIAGNOSIS — N6489 Other specified disorders of breast: Secondary | ICD-10-CM | POA: Diagnosis not present

## 2021-01-11 DIAGNOSIS — N6320 Unspecified lump in the left breast, unspecified quadrant: Secondary | ICD-10-CM

## 2021-01-11 DIAGNOSIS — N6323 Unspecified lump in the left breast, lower outer quadrant: Secondary | ICD-10-CM | POA: Diagnosis not present

## 2021-01-11 DIAGNOSIS — R928 Other abnormal and inconclusive findings on diagnostic imaging of breast: Secondary | ICD-10-CM | POA: Diagnosis not present

## 2021-01-16 ENCOUNTER — Telehealth: Payer: Self-pay

## 2021-01-16 DIAGNOSIS — R600 Localized edema: Secondary | ICD-10-CM | POA: Diagnosis not present

## 2021-01-16 DIAGNOSIS — M6281 Muscle weakness (generalized): Secondary | ICD-10-CM | POA: Diagnosis not present

## 2021-01-16 DIAGNOSIS — H6123 Impacted cerumen, bilateral: Secondary | ICD-10-CM | POA: Diagnosis not present

## 2021-01-16 NOTE — Telephone Encounter (Signed)
-----   Message from Earlie Server, MD sent at 01/15/2021  9:48 PM EST ----- Please let patient and daughter know that Mammogram is normal.

## 2021-01-16 NOTE — Telephone Encounter (Signed)
[name] notified.

## 2021-01-24 ENCOUNTER — Inpatient Hospital Stay: Payer: Medicare HMO

## 2021-01-24 ENCOUNTER — Other Ambulatory Visit: Payer: Self-pay

## 2021-01-24 DIAGNOSIS — L259 Unspecified contact dermatitis, unspecified cause: Secondary | ICD-10-CM | POA: Diagnosis not present

## 2021-01-24 DIAGNOSIS — I83012 Varicose veins of right lower extremity with ulcer of calf: Secondary | ICD-10-CM | POA: Diagnosis not present

## 2021-01-25 DIAGNOSIS — R7989 Other specified abnormal findings of blood chemistry: Secondary | ICD-10-CM | POA: Diagnosis not present

## 2021-01-29 DIAGNOSIS — I10 Essential (primary) hypertension: Secondary | ICD-10-CM | POA: Diagnosis not present

## 2021-01-29 DIAGNOSIS — R2681 Unsteadiness on feet: Secondary | ICD-10-CM | POA: Diagnosis not present

## 2021-01-29 DIAGNOSIS — Z8616 Personal history of COVID-19: Secondary | ICD-10-CM | POA: Diagnosis not present

## 2021-01-29 DIAGNOSIS — E785 Hyperlipidemia, unspecified: Secondary | ICD-10-CM | POA: Diagnosis not present

## 2021-01-29 DIAGNOSIS — G9341 Metabolic encephalopathy: Secondary | ICD-10-CM | POA: Diagnosis not present

## 2021-01-29 DIAGNOSIS — I82411 Acute embolism and thrombosis of right femoral vein: Secondary | ICD-10-CM | POA: Diagnosis not present

## 2021-01-30 DIAGNOSIS — R600 Localized edema: Secondary | ICD-10-CM | POA: Diagnosis not present

## 2021-01-30 DIAGNOSIS — F333 Major depressive disorder, recurrent, severe with psychotic symptoms: Secondary | ICD-10-CM | POA: Diagnosis not present

## 2021-01-30 DIAGNOSIS — I509 Heart failure, unspecified: Secondary | ICD-10-CM | POA: Diagnosis not present

## 2021-01-30 DIAGNOSIS — H6123 Impacted cerumen, bilateral: Secondary | ICD-10-CM | POA: Diagnosis not present

## 2021-01-30 DIAGNOSIS — F0391 Unspecified dementia with behavioral disturbance: Secondary | ICD-10-CM | POA: Diagnosis not present

## 2021-01-30 DIAGNOSIS — G4701 Insomnia due to medical condition: Secondary | ICD-10-CM | POA: Diagnosis not present

## 2021-01-30 DIAGNOSIS — Z86718 Personal history of other venous thrombosis and embolism: Secondary | ICD-10-CM | POA: Diagnosis not present

## 2021-01-30 DIAGNOSIS — B379 Candidiasis, unspecified: Secondary | ICD-10-CM | POA: Diagnosis not present

## 2021-01-30 DIAGNOSIS — N1831 Chronic kidney disease, stage 3a: Secondary | ICD-10-CM | POA: Diagnosis not present

## 2021-01-30 DIAGNOSIS — M6281 Muscle weakness (generalized): Secondary | ICD-10-CM | POA: Diagnosis not present

## 2021-01-30 DIAGNOSIS — I10 Essential (primary) hypertension: Secondary | ICD-10-CM | POA: Diagnosis not present

## 2021-01-30 DIAGNOSIS — E785 Hyperlipidemia, unspecified: Secondary | ICD-10-CM | POA: Diagnosis not present

## 2021-01-31 DIAGNOSIS — I83012 Varicose veins of right lower extremity with ulcer of calf: Secondary | ICD-10-CM | POA: Diagnosis not present

## 2021-02-02 DIAGNOSIS — H6123 Impacted cerumen, bilateral: Secondary | ICD-10-CM | POA: Diagnosis not present

## 2021-02-02 DIAGNOSIS — H93299 Other abnormal auditory perceptions, unspecified ear: Secondary | ICD-10-CM | POA: Diagnosis not present

## 2021-02-05 DIAGNOSIS — I10 Essential (primary) hypertension: Secondary | ICD-10-CM | POA: Diagnosis not present

## 2021-02-05 DIAGNOSIS — E785 Hyperlipidemia, unspecified: Secondary | ICD-10-CM | POA: Diagnosis not present

## 2021-02-05 DIAGNOSIS — Z8616 Personal history of COVID-19: Secondary | ICD-10-CM | POA: Diagnosis not present

## 2021-02-05 DIAGNOSIS — I82411 Acute embolism and thrombosis of right femoral vein: Secondary | ICD-10-CM | POA: Diagnosis not present

## 2021-02-05 DIAGNOSIS — G9341 Metabolic encephalopathy: Secondary | ICD-10-CM | POA: Diagnosis not present

## 2021-02-05 DIAGNOSIS — R2681 Unsteadiness on feet: Secondary | ICD-10-CM | POA: Diagnosis not present

## 2021-02-06 DIAGNOSIS — I82411 Acute embolism and thrombosis of right femoral vein: Secondary | ICD-10-CM | POA: Diagnosis not present

## 2021-02-06 DIAGNOSIS — G9341 Metabolic encephalopathy: Secondary | ICD-10-CM | POA: Diagnosis not present

## 2021-02-06 DIAGNOSIS — R2681 Unsteadiness on feet: Secondary | ICD-10-CM | POA: Diagnosis not present

## 2021-02-06 DIAGNOSIS — E785 Hyperlipidemia, unspecified: Secondary | ICD-10-CM | POA: Diagnosis not present

## 2021-02-06 DIAGNOSIS — Z8616 Personal history of COVID-19: Secondary | ICD-10-CM | POA: Diagnosis not present

## 2021-02-06 DIAGNOSIS — I10 Essential (primary) hypertension: Secondary | ICD-10-CM | POA: Diagnosis not present

## 2021-02-07 DIAGNOSIS — R2681 Unsteadiness on feet: Secondary | ICD-10-CM | POA: Diagnosis not present

## 2021-02-07 DIAGNOSIS — I10 Essential (primary) hypertension: Secondary | ICD-10-CM | POA: Diagnosis not present

## 2021-02-07 DIAGNOSIS — E785 Hyperlipidemia, unspecified: Secondary | ICD-10-CM | POA: Diagnosis not present

## 2021-02-07 DIAGNOSIS — G9341 Metabolic encephalopathy: Secondary | ICD-10-CM | POA: Diagnosis not present

## 2021-02-07 DIAGNOSIS — I83012 Varicose veins of right lower extremity with ulcer of calf: Secondary | ICD-10-CM | POA: Diagnosis not present

## 2021-02-07 DIAGNOSIS — I82411 Acute embolism and thrombosis of right femoral vein: Secondary | ICD-10-CM | POA: Diagnosis not present

## 2021-02-07 DIAGNOSIS — Z8616 Personal history of COVID-19: Secondary | ICD-10-CM | POA: Diagnosis not present

## 2021-02-08 DIAGNOSIS — G9341 Metabolic encephalopathy: Secondary | ICD-10-CM | POA: Diagnosis not present

## 2021-02-08 DIAGNOSIS — R2681 Unsteadiness on feet: Secondary | ICD-10-CM | POA: Diagnosis not present

## 2021-02-08 DIAGNOSIS — I82411 Acute embolism and thrombosis of right femoral vein: Secondary | ICD-10-CM | POA: Diagnosis not present

## 2021-02-08 DIAGNOSIS — I10 Essential (primary) hypertension: Secondary | ICD-10-CM | POA: Diagnosis not present

## 2021-02-08 DIAGNOSIS — Z8616 Personal history of COVID-19: Secondary | ICD-10-CM | POA: Diagnosis not present

## 2021-02-08 DIAGNOSIS — E785 Hyperlipidemia, unspecified: Secondary | ICD-10-CM | POA: Diagnosis not present

## 2021-02-09 DIAGNOSIS — E785 Hyperlipidemia, unspecified: Secondary | ICD-10-CM | POA: Diagnosis not present

## 2021-02-09 DIAGNOSIS — I10 Essential (primary) hypertension: Secondary | ICD-10-CM | POA: Diagnosis not present

## 2021-02-09 DIAGNOSIS — I82411 Acute embolism and thrombosis of right femoral vein: Secondary | ICD-10-CM | POA: Diagnosis not present

## 2021-02-09 DIAGNOSIS — Z8616 Personal history of COVID-19: Secondary | ICD-10-CM | POA: Diagnosis not present

## 2021-02-09 DIAGNOSIS — G9341 Metabolic encephalopathy: Secondary | ICD-10-CM | POA: Diagnosis not present

## 2021-02-09 DIAGNOSIS — R2681 Unsteadiness on feet: Secondary | ICD-10-CM | POA: Diagnosis not present

## 2021-02-12 DIAGNOSIS — I10 Essential (primary) hypertension: Secondary | ICD-10-CM | POA: Diagnosis not present

## 2021-02-12 DIAGNOSIS — G9341 Metabolic encephalopathy: Secondary | ICD-10-CM | POA: Diagnosis not present

## 2021-02-12 DIAGNOSIS — I82411 Acute embolism and thrombosis of right femoral vein: Secondary | ICD-10-CM | POA: Diagnosis not present

## 2021-02-12 DIAGNOSIS — R2681 Unsteadiness on feet: Secondary | ICD-10-CM | POA: Diagnosis not present

## 2021-02-12 DIAGNOSIS — E785 Hyperlipidemia, unspecified: Secondary | ICD-10-CM | POA: Diagnosis not present

## 2021-02-12 DIAGNOSIS — Z8616 Personal history of COVID-19: Secondary | ICD-10-CM | POA: Diagnosis not present

## 2021-02-13 DIAGNOSIS — E119 Type 2 diabetes mellitus without complications: Secondary | ICD-10-CM | POA: Diagnosis not present

## 2021-02-14 DIAGNOSIS — I83012 Varicose veins of right lower extremity with ulcer of calf: Secondary | ICD-10-CM | POA: Diagnosis not present

## 2021-02-14 DIAGNOSIS — R2681 Unsteadiness on feet: Secondary | ICD-10-CM | POA: Diagnosis not present

## 2021-02-14 DIAGNOSIS — R2243 Localized swelling, mass and lump, lower limb, bilateral: Secondary | ICD-10-CM | POA: Diagnosis not present

## 2021-02-14 DIAGNOSIS — I10 Essential (primary) hypertension: Secondary | ICD-10-CM | POA: Diagnosis not present

## 2021-02-14 DIAGNOSIS — I82411 Acute embolism and thrombosis of right femoral vein: Secondary | ICD-10-CM | POA: Diagnosis not present

## 2021-02-14 DIAGNOSIS — Z8616 Personal history of COVID-19: Secondary | ICD-10-CM | POA: Diagnosis not present

## 2021-02-14 DIAGNOSIS — R0989 Other specified symptoms and signs involving the circulatory and respiratory systems: Secondary | ICD-10-CM | POA: Diagnosis not present

## 2021-02-14 DIAGNOSIS — E785 Hyperlipidemia, unspecified: Secondary | ICD-10-CM | POA: Diagnosis not present

## 2021-02-14 DIAGNOSIS — G9341 Metabolic encephalopathy: Secondary | ICD-10-CM | POA: Diagnosis not present

## 2021-02-15 DIAGNOSIS — G9341 Metabolic encephalopathy: Secondary | ICD-10-CM | POA: Diagnosis not present

## 2021-02-15 DIAGNOSIS — I82411 Acute embolism and thrombosis of right femoral vein: Secondary | ICD-10-CM | POA: Diagnosis not present

## 2021-02-15 DIAGNOSIS — I10 Essential (primary) hypertension: Secondary | ICD-10-CM | POA: Diagnosis not present

## 2021-02-15 DIAGNOSIS — R2681 Unsteadiness on feet: Secondary | ICD-10-CM | POA: Diagnosis not present

## 2021-02-15 DIAGNOSIS — Z8616 Personal history of COVID-19: Secondary | ICD-10-CM | POA: Diagnosis not present

## 2021-02-15 DIAGNOSIS — E785 Hyperlipidemia, unspecified: Secondary | ICD-10-CM | POA: Diagnosis not present

## 2021-02-16 DIAGNOSIS — I10 Essential (primary) hypertension: Secondary | ICD-10-CM | POA: Diagnosis not present

## 2021-02-16 DIAGNOSIS — G9341 Metabolic encephalopathy: Secondary | ICD-10-CM | POA: Diagnosis not present

## 2021-02-16 DIAGNOSIS — Z8616 Personal history of COVID-19: Secondary | ICD-10-CM | POA: Diagnosis not present

## 2021-02-16 DIAGNOSIS — I82411 Acute embolism and thrombosis of right femoral vein: Secondary | ICD-10-CM | POA: Diagnosis not present

## 2021-02-16 DIAGNOSIS — R2681 Unsteadiness on feet: Secondary | ICD-10-CM | POA: Diagnosis not present

## 2021-02-16 DIAGNOSIS — E785 Hyperlipidemia, unspecified: Secondary | ICD-10-CM | POA: Diagnosis not present

## 2021-02-17 DIAGNOSIS — I82411 Acute embolism and thrombosis of right femoral vein: Secondary | ICD-10-CM | POA: Diagnosis not present

## 2021-02-17 DIAGNOSIS — E785 Hyperlipidemia, unspecified: Secondary | ICD-10-CM | POA: Diagnosis not present

## 2021-02-17 DIAGNOSIS — R2681 Unsteadiness on feet: Secondary | ICD-10-CM | POA: Diagnosis not present

## 2021-02-17 DIAGNOSIS — Z8616 Personal history of COVID-19: Secondary | ICD-10-CM | POA: Diagnosis not present

## 2021-02-17 DIAGNOSIS — G9341 Metabolic encephalopathy: Secondary | ICD-10-CM | POA: Diagnosis not present

## 2021-02-17 DIAGNOSIS — I10 Essential (primary) hypertension: Secondary | ICD-10-CM | POA: Diagnosis not present

## 2021-02-19 DIAGNOSIS — E785 Hyperlipidemia, unspecified: Secondary | ICD-10-CM | POA: Diagnosis not present

## 2021-02-19 DIAGNOSIS — Z8616 Personal history of COVID-19: Secondary | ICD-10-CM | POA: Diagnosis not present

## 2021-02-19 DIAGNOSIS — I82411 Acute embolism and thrombosis of right femoral vein: Secondary | ICD-10-CM | POA: Diagnosis not present

## 2021-02-19 DIAGNOSIS — G9341 Metabolic encephalopathy: Secondary | ICD-10-CM | POA: Diagnosis not present

## 2021-02-19 DIAGNOSIS — I10 Essential (primary) hypertension: Secondary | ICD-10-CM | POA: Diagnosis not present

## 2021-02-19 DIAGNOSIS — R2681 Unsteadiness on feet: Secondary | ICD-10-CM | POA: Diagnosis not present

## 2021-02-20 DIAGNOSIS — F0391 Unspecified dementia with behavioral disturbance: Secondary | ICD-10-CM | POA: Diagnosis not present

## 2021-02-21 DIAGNOSIS — I83012 Varicose veins of right lower extremity with ulcer of calf: Secondary | ICD-10-CM | POA: Diagnosis not present

## 2021-02-27 DIAGNOSIS — F0391 Unspecified dementia with behavioral disturbance: Secondary | ICD-10-CM | POA: Diagnosis not present

## 2021-02-27 DIAGNOSIS — G4701 Insomnia due to medical condition: Secondary | ICD-10-CM | POA: Diagnosis not present

## 2021-02-27 DIAGNOSIS — F0631 Mood disorder due to known physiological condition with depressive features: Secondary | ICD-10-CM | POA: Diagnosis not present

## 2021-03-02 DIAGNOSIS — E119 Type 2 diabetes mellitus without complications: Secondary | ICD-10-CM | POA: Diagnosis not present

## 2021-03-02 DIAGNOSIS — I5020 Unspecified systolic (congestive) heart failure: Secondary | ICD-10-CM | POA: Diagnosis not present

## 2021-03-02 DIAGNOSIS — K861 Other chronic pancreatitis: Secondary | ICD-10-CM | POA: Diagnosis not present

## 2021-03-02 DIAGNOSIS — E559 Vitamin D deficiency, unspecified: Secondary | ICD-10-CM | POA: Diagnosis not present

## 2021-03-02 DIAGNOSIS — I10 Essential (primary) hypertension: Secondary | ICD-10-CM | POA: Diagnosis not present

## 2021-03-06 DIAGNOSIS — M24572 Contracture, left ankle: Secondary | ICD-10-CM | POA: Diagnosis not present

## 2021-03-06 DIAGNOSIS — L603 Nail dystrophy: Secondary | ICD-10-CM | POA: Diagnosis not present

## 2021-03-06 DIAGNOSIS — I739 Peripheral vascular disease, unspecified: Secondary | ICD-10-CM | POA: Diagnosis not present

## 2021-03-06 DIAGNOSIS — M24571 Contracture, right ankle: Secondary | ICD-10-CM | POA: Diagnosis not present

## 2021-03-20 ENCOUNTER — Other Ambulatory Visit: Payer: Self-pay

## 2021-03-20 NOTE — Patient Outreach (Signed)
[facility]) Care Management  03/20/2021  DAUN RENS 06-Nov-1929 [id number]   Telephone Screen  Referral Date: 03/20/2021 Referral [place] Tier 4 Report Referral Reason: "high risk patient" Insurance: [employer]   Outreach attempt # 1 to patient. No answer after multiple rings and unable to leave message and voicemail box full.      Plan: RN CM will make outreach attempt to patient within 3-4 business days. RN CM will send unsuccessful outreach letter to patient.   Enzo Montgomery, RN,BSN,CCM [place] Management Telephonic Care Management Coordinator Direct Phone: [phone] Toll Free: [phone] Fax: [phone]

## 2021-03-22 ENCOUNTER — Other Ambulatory Visit: Payer: Self-pay

## 2021-03-22 NOTE — Patient Outreach (Signed)
[facility]) Care Management  03/22/2021  Tracy Mcmahon 1929-10-21 [id number]   Telephone Screen  Referral Date: 03/20/2021 Referral Source:[employer] Tier 4 Report Referral Reason: "high risk patient" Insurance: [employer]    Unusccessful outreach attempt #2 to patient.  Plan: RN CM will make outreach attempt to patient within 3-4 business days.  Enzo Montgomery, RN,BSN,CCM [place] Management Telephonic Care Management Coordinator Direct Phone: [phone] Toll Free: [phone] Fax: [phone]

## 2021-03-26 ENCOUNTER — Other Ambulatory Visit: Payer: Self-pay

## 2021-03-26 NOTE — Patient Outreach (Signed)
[place] [facility]) Care Management  03/26/2021  NARYA BEAVIN 05-Oct-1929 [id number]    Telephone Screen  Referral Date:03/20/2021 Referral Source:[employer] Tier 4 Report Referral Reason:"high risk patient" Insurance:[employer] Medicare   Unsuccessful outreach attempt.    Plan: RN CM will make outreach attempt to patient within 3-4 wks if no response from letter mailed to patient.  Enzo Montgomery, RN,BSN,CCM [place] Management Telephonic Care Management Coordinator Direct Phone: [phone] Toll Free: [phone] Fax: [phone]

## 2021-04-12 ENCOUNTER — Other Ambulatory Visit: Payer: Self-pay

## 2021-04-12 NOTE — Patient Outreach (Signed)
[facility]) Care Management  04/12/2021  ELIF YONTS [birth year] [id number]    Telephone Screen  Referral Date:03/20/2021 Referral Source:[employer] Tier 4 Report Referral Reason:"high risk patient" Insurance:[employer] Medicare   Multiple attempts to establish contact with patient without success. No response from letter mailed to patient. Case is being closed at this time.    Plan: RN CM will close referral at this time.    Enzo Montgomery, RN,BSN,CCM [place] Management Telephonic Care Management Coordinator Direct Phone: [phone] Toll Free: [phone] Fax: [phone]

## 2021-04-17 ENCOUNTER — Ambulatory Visit: Payer: Self-pay

## 2021-09-12 DIAGNOSIS — Z23 Encounter for immunization: Secondary | ICD-10-CM | POA: Diagnosis not present

## 2022-03-26 ENCOUNTER — Non-Acute Institutional Stay: Payer: 59 | Admitting: Primary Care

## 2022-03-26 DIAGNOSIS — K5900 Constipation, unspecified: Secondary | ICD-10-CM

## 2022-03-26 DIAGNOSIS — R531 Weakness: Secondary | ICD-10-CM

## 2022-03-26 DIAGNOSIS — Z515 Encounter for palliative care: Secondary | ICD-10-CM

## 2022-03-26 DIAGNOSIS — R627 Adult failure to thrive: Secondary | ICD-10-CM

## 2022-03-26 NOTE — Progress Notes (Signed)
 Therapist, nutritional Palliative Care Consult Note Telephone: [phone]  Fax: [phone]   Date of encounter: 03/26/22 4:30 pm PATIENT [name] [address] [place] [address]   [phone] (home)  DOB: 11-03-1929 MRN: [id number] PRIMARY CARE PROVIDER:    Mylinda Lenis, MD,  [address] [place] [address] [phone]  REFERRING PROVIDER:   Eilleen Query, MD [address] [place],  [place] [address] [phone]  RESPONSIBLE PARTY:    Contact Information     Name Relation Home Work Mobile   Thompson,[name] Daughter [phone]  [phone]   Freshwater,Catherine Daughter [phone]  [phone]   Leonce Rhoda Salter [phone]          I met face to face with patient and family in [facility]. Palliative Care was asked to follow this patient by consultation request of  Eilleen Query, MD to address advance care planning and complex medical decision making. This is the initial visit.                                     ASSESSMENT AND PLAN / RECOMMENDATIONS:   Advance Care Planning/Goals of Care: Goals include to maximize quality of life and symptom management. Patient/health care surrogate gave his/her permission to discuss.Our advance care planning conversation included a discussion about:    The value and importance of advance care planning  Experiences with loved ones who have been seriously ill or have died  Exploration of personal, cultural or spiritual beliefs that might influence medical decisions  Exploration of goals of care in the event of a sudden injury or illness  Identification of a healthcare agent - daughter Niels Review  of an  advance directive document .  CODE STATUS: DNR  I reviewed a MOST form today. The patient and family outlined their wishes for the following treatment decisions:  Cardiopulmonary Resuscitation: Do Not Attempt Resuscitation (DNR/No CPR)  Medical  Interventions: Comfort Measures: Keep clean, warm, and dry. Use medication by any route, positioning, wound care, and other measures to relieve pain and suffering. Use oxygen , suction and manual treatment of airway obstruction as needed for comfort. Do not transfer to the hospital unless comfort needs cannot be met in current location.  Antibiotics: Antibiotics if indicated  IV Fluids: IV fluids if indicated  Feeding Tube: No feeding tube     Symptom Management/Plan:  I met patient in her nursing home room. Two daughters were in attendance. They stated they visit daily from about noon till five. Another sister , janeane, is just getting out of a nursing home this evening but usually also visits. Their mother has been a resident here for a year and a half and that she is fairly comfortable. She does have frequent constipation and should be on a bowel program. I discussed my role as a palliative practitioner to support managing symptoms and help clarify goals of care. Apparently  she and a roommate do not get on very well and it may behoove them to have a room change.   Follow up Palliative Care Visit: Palliative care will continue to follow for complex medical decision making, advance care planning, and clarification of goals. Return 6 weeks or prn.  I spent 35 minutes providing this consultation. More than 50% of the time in this consultation was spent in counseling and care coordination. PPS: 30%  HOSPICE ELIGIBILITY/DIAGNOSIS: TBD  Chief Complaint: debility, immobility  HISTORY OF PRESENT ILLNESS:  Tracy Mcmahon is a 88 y.o. year old female  with debility, immobility, cognitive impairment . Patient seen today to review palliative care needs to include medical decision making and advance care planning as appropriate.   History obtained from review of EMR, discussion with primary team, and interview with family, facility staff/caregiver and/or Ms. Munroe.  I reviewed available labs, medications,  imaging, studies and related documents from the EMR.  Records reviewed and summarized above.   ROS   General: NAD ENMT: denies dysphagia Cardiovascular: denies chest pain, denies DOE Pulmonary: denies cough, denies increased SOB Abdomen: endorses good appetite, endorses constipation, endorses incontinence of bowel GU: denies dysuria, endorses incontinence of urine MSK:  endorses  increased weakness,  no falls reported Skin: denies rashes or wounds Neurological: denies pain, denies insomnia Psych: Endorses positive mood Heme/lymph/immuno: denies bruises, abnormal bleeding  Physical Exam: Current and past weights: 146 lbs Constitutional: NAD General: frail appearing,  EYES: anicteric sclera, lids intact, no discharge  ENMT: intact hearing, oral mucous membranes moist, dentition intact CV: S1S2, RRR, no LE edema Pulmonary: LCTA, no increased work of breathing, no cough,  Abdomen: intake 76-100%, normo-active BS + 4 quadrants, soft and distended,  no ascites GU: deferred MSK: + sarcopenia, moves all extremities,  non ambulatory Skin: warm and dry, no rashes or wounds on visible skin Neuro:  + generalized weakness, +cognitive impairment Psych: non-anxious affect, A and O x 1 Hem/lymph/immuno: no widespread bruising CURRENT PROBLEM LIST:  Patient Active Problem List   Diagnosis Date Noted   Chronic pancreatitis (HCC)    Acute kidney injury superimposed on CKD (HCC)    Nausea and vomiting    Abdominal pain    Constipation    Sacral decubitus ulcer, stage II (HCC)    FTT (failure to thrive) in adult 08/21/2020   E. coli UTI 10/20/2018   Pressure injury of skin 08/15/2018   Weakness generalized 06/26/2018   Hypoglycemia 04/30/2018   Lower urinary tract infectious disease 03/15/2018   Acute encephalopathy 12/19/2016   Generalized weakness 12/19/2016   Acute on chronic respiratory failure with hypoxia and hypercapnia (HCC) 12/16/2016   COPD exacerbation (HCC) 12/16/2016    Community acquired pneumonia 12/16/2016   Lactic acidosis 12/16/2016   Sepsis (HCC) 12/16/2016   PE (pulmonary embolism) 08/11/2016   COPD (chronic obstructive pulmonary disease) (HCC) 03/29/2015   Hypertension 03/29/2015   Diabetes (HCC) 03/29/2015   Hyperlipidemia 03/29/2015   Depression 03/29/2015   Restless leg syndrome 03/29/2015   DVT (deep venous thrombosis) (HCC) 03/29/2015   CKD (chronic kidney disease), stage III (HCC) 03/29/2015   PAST MEDICAL HISTORY:  Active Ambulatory Problems    Diagnosis Date Noted   COPD (chronic obstructive pulmonary disease) (HCC) 03/29/2015   Hypertension 03/29/2015   Diabetes (HCC) 03/29/2015   Hyperlipidemia 03/29/2015   Depression 03/29/2015   Restless leg syndrome 03/29/2015   DVT (deep venous thrombosis) (HCC) 03/29/2015   CKD (chronic kidney disease), stage III (HCC) 03/29/2015   PE (pulmonary embolism) 08/11/2016   Acute on chronic respiratory failure with hypoxia and hypercapnia (HCC) 12/16/2016   COPD exacerbation (HCC) 12/16/2016   Community acquired pneumonia 12/16/2016   Lactic acidosis 12/16/2016   Sepsis (HCC) 12/16/2016   Acute encephalopathy 12/19/2016   Generalized weakness 12/19/2016   Lower urinary tract infectious disease 03/15/2018   Hypoglycemia 04/30/2018   Weakness generalized 06/26/2018   Pressure injury of skin 08/15/2018   E. coli UTI 10/20/2018  FTT (failure to thrive) in adult 08/21/2020   Sacral decubitus ulcer, stage II (HCC)    Nausea and vomiting    Abdominal pain    Constipation    Chronic pancreatitis (HCC)    Acute kidney injury superimposed on CKD (HCC)    Resolved Ambulatory Problems    Diagnosis Date Noted   No Resolved Ambulatory Problems   Past Medical History:  Diagnosis Date   Cancer (HCC)    Cataract    Chronic kidney disease (CKD), stage III (moderate) (HCC)    Chronic urticaria    Diabetes mellitus without complication (HCC)    Hyperlipemia    Osteoarthritis    Osteopenia     Polymyalgia rheumatica (HCC)    Polyneuropathy    Renal cancer (HCC)    Renal insufficiency 08/10/2016   Vitamin D  deficiency    Vulvar intraepithelial neoplasia with lichen sclerosus    SOCIAL HX:  Social History   Tobacco Use   Smoking status: Never   Smokeless tobacco: Never  Substance Use Topics   Alcohol  use: No    Alcohol /week: 0.0 standard drinks   FAMILY HX:  Family History  Problem Relation Age of Onset   Hypertension Other    Diabetes Other    Heart disease Mother    Lung cancer Father    Colon cancer Sister    Colon cancer Brother    Cancer Sister    Breast cancer Neg Hx       ALLERGIES:  Allergies  Allergen Reactions   Fosamax  [Alendronate  Sodium] Other (See Comments)    Reaction:  Made pt very sick   Wellbutrin [Bupropion] Other (See Comments)    Reaction:  Unknown     PERTINENT MEDICATIONS:  Outpatient Encounter Medications as of 03/26/2022  Medication Sig   acetaminophen  (TYLENOL ) 325 MG tablet Take 650 mg by mouth every 4 (four) hours as needed.   atenolol  (TENORMIN ) 25 MG tablet Take 25 mg by mouth 2 (two) times daily.   carbamide peroxide (DEBROX) 6.5 % OTIC solution Place 5 drops into both ears daily.   ELIQUIS  2.5 MG TABS tablet Take 2.5 mg by mouth 2 (two) times daily.   gabapentin  (NEURONTIN ) 100 MG capsule Take 1 capsule (100 mg total) by mouth 2 (two) times daily. (Patient not taking: Reported on 01/04/2021)   lipase/protease/amylase (CREON ) 36000 UNITS CPEP capsule Take 2 capsules (72,000 Units total) by mouth 3 (three) times daily before meals.   midodrine  (PROAMATINE ) 10 MG tablet Take 1 tablet (10 mg total) by mouth 3 (three) times daily with meals. (Patient not taking: Reported on 01/04/2021)   mirtazapine  (REMERON ) 7.5 MG tablet Take 7.5 mg by mouth at bedtime.   nystatin (NYSTATIN) powder Apply 1 application topically 2 (two) times daily.   ondansetron  (ZOFRAN ) 4 MG tablet Take 1 tablet (4 mg total) by mouth every 6 (six) hours as needed  for nausea.   pravastatin  (PRAVACHOL ) 20 MG tablet Take 20 mg by mouth every evening.    senna-docusate (SENNA-PLUS) 8.6-50 MG tablet Take 1 tablet by mouth 2 (two) times daily.   traZODone  (DESYREL ) 50 MG tablet Take 1 tablet (50 mg total) by mouth at bedtime.   Vitamin D , Ergocalciferol , (DRISDOL ) 50000 UNITS CAPS capsule Take 50,000 Units by mouth every Sunday.    No facility-administered encounter medications on file as of 03/26/2022.   Thank you for the opportunity to participate in the care of Ms. Wonda.  The palliative care team will continue to follow. Please  call our office at [phone] if we can be of additional assistance.   Lamarr Welby Sharps, NP , DNP, AGPCNP-BC  COVID-19 PATIENT SCREENING TOOL Asked and negative response unless otherwise noted:  Have you had symptoms of covid, tested positive or been in contact with someone with symptoms/positive test in the past 5-10 days?

## 2022-04-08 ENCOUNTER — Telehealth: Payer: Self-pay

## 2022-04-08 NOTE — Telephone Encounter (Signed)
336 pm.  Message received from daughter Deirdre Pippins.  Return call made.  [facility] needed clarification on Palliative Care roll as they have not see patient's normal provider in awhile.  New medications and blood sugar checks are being done and family would like an update on patient and orders.  I have re-directed her to the charge nurse or SW at facility to voice concerns and obtain an update. ? ?Ralene Bathe, NP updated on phone call.  ?

## 2022-05-28 ENCOUNTER — Non-Acute Institutional Stay: Payer: Medicare Other | Admitting: Primary Care

## 2022-05-28 DIAGNOSIS — R627 Adult failure to thrive: Secondary | ICD-10-CM

## 2022-05-28 DIAGNOSIS — Z515 Encounter for palliative care: Secondary | ICD-10-CM

## 2022-05-28 DIAGNOSIS — R531 Weakness: Secondary | ICD-10-CM

## 2022-05-28 NOTE — Progress Notes (Signed)
[facility] Consult Note Telephone: [phone]  Fax: [phone]    Date of encounter: 05/28/22 1:23 PM PATIENT [name] [place] [address]   [phone] (home)  DOB: 1929/06/21 MRN: [id number] PRIMARY CARE PROVIDER:    Rica Koyanagi, MD [place] [address] [phone]   REFERRING PROVIDER:   Rica Koyanagi, [place] [address] [phone]  RESPONSIBLE PARTY:    Contact Information     Name Relation Home Work Mobile   Thompson,[name] Daughter [phone]  [phone]   Freshwater,Catherine Daughter [phone]  [phone]   Dossie Der [phone]          I met face to face with patient and family in  [facility]. Palliative Care was asked to follow this patient by consultation request of  Rica Koyanagi, MD  to address advance care planning and complex medical decision making. This is a follow up visit.                                   ASSESSMENT AND PLAN / RECOMMENDATIONS:   Advance Care Planning/Goals of Care: Goals include to maximize quality of life and symptom management. Patient/health care surrogate gave his/her permission to discuss.Our advance care planning conversation included a discussion about:     Exploration of personal, cultural or spiritual beliefs that might influence medical decisions  Identification of a healthcare agent - [name] Review  of an  advance directive document . CODE STATUS: DNR I reviewed  MOST form today. The patient and family outlined their wishes for the following treatment decisions:  Cardiopulmonary Resuscitation: Do Not Attempt Resuscitation (DNR/No CPR)  Medical Interventions: Comfort Measures: Keep clean, warm, and dry. Use medication by any route, positioning, wound care, and other measures to relieve pain and suffering. Use oxygen, suction and manual treatment of airway  obstruction as needed for comfort. Do not transfer to the hospital unless comfort needs cannot be met in current location.  Antibiotics: Antibiotics if indicated  IV Fluids: IV fluids if indicated  Feeding Tube: No feeding tube   Symptom Management/Plan:  I met with patient in her nursing home room for routine check. Her three daughters are in attendance.   Diabetes:She is finishing her lunch and states her appetite is good. However, her glucose is have been running close to 270 FBS  to 350 mg/ dl fairly consistently.Hemoglobin A1c  = 8% 3 months ago.I discuss with her family that she likely needs more insulin as her and take has increased. She's eating a lot per family report, and losing weight. She is also running consistently high glucose which would to my recommendation require some increase in her basal rate.   Nutrition: We discuss nutritious or heart healthy snacks. She likes fruit and cheese. See above, with increased bg level and weight loss.  Follow up Palliative Care Visit: Palliative care will continue to follow for complex medical decision making, advance care planning, and clarification of goals. Return 6 weeks or prn.  I spent 25 minutes providing this consultation. More than 50% of the time in this consultation was spent in counseling and care coordination.  PPS: 40%  HOSPICE ELIGIBILITY/DIAGNOSIS: TBD  Chief Complaint: debility, dementia, weight loss   HISTORY OF PRESENT ILLNESS:  Tracy Mcmahon is a 88 y.o. year old female  with DM, dementia,  immobility, weight loss, possible tooth abscess . Patient seen today to review palliative care needs to include medical decision making and advance care planning as appropriate.   History obtained from review of EMR, discussion with primary team, and interview with family, facility staff/caregiver and/or Tracy Mcmahon.  I reviewed available labs, medications, imaging, studies and related documents from the EMR.  Records reviewed and  summarized above.   ROS  General: NAD EYES: denies vision changes ENMT: denies dysphagia, endorses tooth ache Cardiovascular: denies chest pain, denies DOE Pulmonary: denies cough, denies increased SOB Abdomen: endorses good appetite, denies constipation, endorses incontinence of bowel GU: denies dysuria, endorses incontinence of urine MSK:  denies  increased weakness,  no falls reported Skin: denies rashes or wounds Neurological: denies pain, denies insomnia Psych: Endorses fatigue  Physical Exam: Current and past weights:6 lb weight loss, 5% in recent months Constitutional: NAD General: frail appearing, wnwd EYES: anicteric sclera, lids intact, no discharge  ENMT: intact hearing, oral mucous membranes moist, dentition missing CV: no LE edema Pulmonary: no increased work of breathing, no cough, room air Abdomen: intake 80%, no ascites MSK: + sarcopenia, moves all extremities,  non ambulatory Skin: warm and dry, no rashes or wounds on visible skin Neuro:  no  new generalized weakness,  advanced  cognitive impairment, slight anxious affect  Outpatient Encounter Medications as of 05/28/2022  Medication Sig   acetaminophen (TYLENOL) 325 MG tablet Take 650 mg by mouth every 6 (six) hours as needed.   Amino Acids-Protein Hydrolys (PRO-STAT SUGAR FREE PO) Take 30 mLs by mouth daily at 12 noon.   aspirin EC 81 MG tablet Take 81 mg by mouth daily. Swallow whole.   atenolol (TENORMIN) 25 MG tablet Take 12.5 mg by mouth 2 (two) times daily.   cetirizine (ZYRTEC) 10 MG tablet Take 10 mg by mouth daily.   Cholecalciferol 50 MCG (2000 UT) TABS Take by mouth.   Emollient (CETAPHIL) cream Apply 1 Application topically 2 (two) times daily.   Fenofibrate 40 MG TABS Take 1 tablet by mouth daily.   Insulin Glargine Solostar (LANTUS) 100 UNIT/ML Solostar Pen Inject 3 Units into the skin at bedtime.   insulin lispro (HUMALOG) 100 UNIT/ML KwikPen Inject 3 Units into the skin 3 (three) times daily as  needed. insulin lispro insulin pen; 100 unit/mL; amt: Per Sliding Scale; If Blood Sugar is less than 70, call NP/PA. If Blood Sugar is 0 to 249, give 0 Units. If Blood Sugar is 250 to 299, give 3 Units. If Blood Sugar is 300 to 349, give 5 Units. If Blood Sugar is 350 to 399, give 7 Units. If Blood Sugar is 400 to 449, give 9 Units. If Blood Sugar is greater than 450, call MD. subcutaneous Special Instructions: Before lunch and dinner Twice A Day 07:30, 16:30   lipase/protease/amylase (CREON) 36000 UNITS CPEP capsule Take 2 capsules (72,000 Units total) by mouth 3 (three) times daily before meals.   loperamide (IMODIUM A-D) 2 MG tablet Take 2 mg by mouth 4 (four) times daily as needed for diarrhea or loose stools.   Multiple Vitamins-Minerals (MULTIVITAMIN WITH MINERALS) tablet Take 1 tablet by mouth daily.   pravastatin (PRAVACHOL) 20 MG tablet Take 10 mg by mouth every evening.   senna-docusate (SENNA-PLUS) 8.6-50 MG tablet Take 1 tablet by mouth 2 (two) times daily.   sertraline (ZOLOFT) 25 MG tablet Take 25 mg by mouth daily.   [DISCONTINUED] Amino Acids-Protein Hydrolys (PRO-STAT 101 PO) Take by mouth.   [DISCONTINUED]  carbamide peroxide (DEBROX) 6.5 % OTIC solution Place 5 drops into both ears daily.   [DISCONTINUED] ELIQUIS 2.5 MG TABS tablet Take 2.5 mg by mouth 2 (two) times daily.   [DISCONTINUED] gabapentin (NEURONTIN) 100 MG capsule Take 1 capsule (100 mg total) by mouth 2 (two) times daily. (Patient not taking: Reported on 01/04/2021)   [DISCONTINUED] midodrine (PROAMATINE) 10 MG tablet Take 1 tablet (10 mg total) by mouth 3 (three) times daily with meals. (Patient not taking: Reported on 01/04/2021)   [DISCONTINUED] mirtazapine (REMERON) 7.5 MG tablet Take 7.5 mg by mouth at bedtime.   [DISCONTINUED] nystatin (NYSTATIN) powder Apply 1 application topically 2 (two) times daily.   [DISCONTINUED] ondansetron (ZOFRAN) 4 MG tablet Take 1 tablet (4 mg total) by mouth every 6 (six) hours as needed  for nausea.   [DISCONTINUED] traZODone (DESYREL) 50 MG tablet Take 1 tablet (50 mg total) by mouth at bedtime.   [DISCONTINUED] Vitamin D, Ergocalciferol, (DRISDOL) 50000 UNITS CAPS capsule Take 50,000 Units by mouth every Sunday.    No facility-administered encounter medications on file as of 05/28/2022.    Thank you for the opportunity to participate in the care of Tracy Mcmahon.  The palliative care team will continue to follow. Please call our office at [phone] if we can be of additional assistance.   Jason Coop, NP DNP, AGPCNP-BC  COVID-19 PATIENT SCREENING TOOL Asked and negative response unless otherwise noted:   Have you had symptoms of covid, tested positive or been in contact with someone with symptoms/positive test in the past 5-10 days?

## 2022-08-19 ENCOUNTER — Non-Acute Institutional Stay: Payer: Medicare Other | Admitting: Primary Care

## 2022-08-19 DIAGNOSIS — R531 Weakness: Secondary | ICD-10-CM

## 2022-08-19 DIAGNOSIS — K5900 Constipation, unspecified: Secondary | ICD-10-CM

## 2022-08-19 DIAGNOSIS — Z515 Encounter for palliative care: Secondary | ICD-10-CM

## 2022-08-19 NOTE — Progress Notes (Signed)
[facility] Consult Note Telephone: [phone]  Fax: [phone]    Date of encounter: 08/19/22 5:44 PM PATIENT [name] Tracy Mcmahon [place] [address]   [phone] (home)  DOB: 10/23/1929 MRN: [id number] PRIMARY CARE PROVIDER:    Alvester Morin, MD [phone] [place]. Tracy Mcma[place] [address] [phone]   REFERRING PROVIDER:   Alvester Morin, MD [place]. Tracy Mcma[place] [address] [phone]  RESPONSIBLE PARTY:    Contact Information     Name Relation Home Work Mobile   Tracy Mcmahon,Tracy Mcmahon Daughter [phone]  [phone]   Tracy Mcmahon,Tracy Mcmahon Daughter [phone]  [phone]   Tracy Mcmahon [phone]          I met face to face with patient and family in [place]. Palliative Care was asked to follow this patient by consultation request of  Tracy Koyanagi, MD to address advance care planning and complex medical decision making. This is a follow up visit.                                   ASSESSMENT AND PLAN / RECOMMENDATIONS:   Advance Care Planning/Goals of Care: Goals include to maximize quality of life and symptom management. Patient/health care surrogate gave his/her permission to discuss.Our advance care planning conversation included a discussion about:     Exploration of personal, cultural or spiritual beliefs that might influence medical decisions   Identification of a healthcare agent - pOA is daughter, but she is in nursing home. Call next daughter for decisions   CODE STATUS: DNR  Symptom Management/Plan:  She has moved to [facility] and is getting settled in . She likes the good care. There are a lot of activities for her to attend, for instance a magic show last week and bible study today. Staff brings snacks, water. They are pleased. She does not have any concerns today to discuss. Reviewed goals of care, comfort oriented  .   Follow up Palliative Care Visit: Palliative care will continue to follow for complex medical decision making, advance care planning, and clarification of goals. Return 6-8 weeks or prn.  I spent 15 minutes providing this consultation. More than 50% of the time in this consultation was spent in counseling and care coordination.  PPS: 40%  HOSPICE ELIGIBILITY/DIAGNOSIS: TBD  Chief Complaint: debility, memory loss  HISTORY OF PRESENT ILLNESS:  Tracy Mcmahon is a 88 y.o. year old female  with dementia, immobility,  . Patient seen today to review palliative care needs to include medical decision making and advance care planning as appropriate.   History obtained from review of EMR, discussion with primary team, and interview with family, facility staff/caregiver and/or Tracy Mcmahon.  I reviewed available labs, medications, imaging, studies and related documents from the EMR.  Records reviewed and summarized above.   ROS   General: NAD EYES: denies vision changes ENMT: denies dysphagia Pulmonary: denies cough, denies increased SOB Abdomen: endorses good appetite, denies constipation, endorses incontinence of bowel GU: denies dysuria, endorses incontinence of urine MSK:  denies  increased weakness,  no falls reported Skin: denies rashes or wounds Neurological: denies pain, denies insomnia Psych: Endorses positive mood  Physical Exam: Current and past weights: 139 lbs, stable Constitutional: NAD General: frail appearing, wnwd EYES: anicteric sclera, lids intact, no discharge  ENMT: intact hearing, oral mucous membranes moist, dentition intact CV: no LE edema Pulmonary: no  increased work of breathing, no cough, room air Abdomen: intake 70%, , no ascites MSK: + sarcopenia, moves all extremities, non  ambulatory Skin: warm and dry, no rashes or wounds on visible skin Neuro:  + generalized weakness,  + cognitive impairment, non-anxious affect   Thank you for the opportunity to  participate in the care of Tracy Mcmahon.  The palliative care team will continue to follow. Please call our office at [phone] if we can be of additional assistance.   Jason Coop, NP DNP, AGPCNP-BC  COVID-19 PATIENT SCREENING TOOL Asked and negative response unless otherwise noted:   Have you had symptoms of covid, tested positive or been in contact with someone with symptoms/positive test in the past 5-10 days?

## 2022-10-30 ENCOUNTER — Non-Acute Institutional Stay: Payer: Medicare Other | Admitting: Nurse Practitioner

## 2022-10-30 DIAGNOSIS — F0280 Dementia in other diseases classified elsewhere without behavioral disturbance: Secondary | ICD-10-CM

## 2022-10-30 DIAGNOSIS — Z515 Encounter for palliative care: Secondary | ICD-10-CM

## 2022-10-30 DIAGNOSIS — R5381 Other malaise: Secondary | ICD-10-CM

## 2022-10-31 ENCOUNTER — Encounter: Payer: Self-pay | Admitting: Nurse Practitioner

## 2022-10-31 NOTE — Progress Notes (Signed)
[facility] Pall[facility] Consult Note Telephone: [phone]  Fax: [phone]    Date of encounter: 10/31/22 10:30 AM PATIENT [name] [place] [address]   [phone] (home)  DOB: [birth year] MRN: [id number] PRIMARY CARE PROVIDER:   [facility] Alvester Morin, MD,  [address] [place] [address] [phone]  RESPONSIBLE PARTY:    Contact Information     Name Relation Home Work Mobile   Tracy Mcmahon,Tracy Mcmahon Daughter [phone]  [phone]   Tracy Mcmahon,Tracy Mcmahon Daughter [phone]  [phone]   Tracy Mcmahon [phone]        I met face to face with patient in facility. Palliative Care was asked to follow this patient by consultation request of  Slade-Hartman, [facility] to address advance care planning and complex medical decision making. This is a follow up visit.                                  ASSESSMENT AND PLAN / RECOMMENDATIONS:  Symptom Management/Plan: Symptom Management/Plan: 1. Advance Care Planning;  DNR 2. Goals of Care: Goals include to maximize quality of life and symptom management. Our advance care planning conversation included a discussion about:    The value and importance of advance care planning  Exploration of personal, cultural or spiritual beliefs that might influence medical decisions  Exploration of goals of care in the event of a sudden injury or illness  Identification and preparation of a healthcare agent  Review and updating or creation of an advance directive document. 3. Palliative care encounter; Palliative care encounter; Palliative medicine team will continue to support patient, patient's family, and medical team. Visit consisted of counseling and education dealing with the complex and emotionally intense issues of symptom management and palliative care in the setting of serious and potentially life-threatening illness  4.  Debility secondary to dementia, continue supportive care, encourage mobiity, oob, fall precautions. Continue discussions of chronic progression of dementia, expectations. Address symptoms and progression as disease progresses. Discussed with daughters. Support provided.  Follow up Palliative Care Visit: Palliative care will continue to follow for complex medical decision making, advance care planning, and clarification of goals. Return 4 to 8 weeks or prn.  I spent 46 minutes providing this consultation started at 1:50 pm. More than 50% of the time in this consultation was spent in counseling and care coordination. PPS: 40% Chief Complaint: Follow up palliative consult for complex medical decision making, address goals, manage ongoing symptoms  HISTORY OF PRESENT ILLNESS:  KIYANI JERNIGAN is a 88 y.o. year old female  with multiple medical problems including dementia, HTN, COPD, h/o DVT/PE, chronic respiratory failure with hypoxia/hypercapnia, chronic pancreatitis, DM, h/o uti's, CKD, RLS, HLD, adult FTT, h/o constipation, depression. Ms. Racey resides at [facility]. Ms. Bessler requires assistance for transfers, mobility, w/c dependent. Ms. Urda does require assistance for adl's, bathing, dressing. Ms. Shehan does feed herself after tray setup. Ms. Aguirre appetite is fair depending on what is being served. Staff endorses no recent falls, wounds, hospitalizations, infections per staff. At present Ms. Kaus is sitting in w/c in her room, with daughters. We talked about purpose of pc visit. We talked about ros, functional and cognitive abilities. Talked about appetite, weights, nutrition. Discussed chronic disease, progression. We talked about what brings Ms. Brazill joy, quality of life. We talked about medical goals, poc, medications reviewed. Will continue to  follow/monitor, currently Ms Phillips is stable. Updated staff, no new changes today recommended.   History obtained from review of EMR, discussion with  primary team, and interview with family, facility staff/caregiver and/or [name].  I reviewed available labs, medications, imaging, studies and related documents from the EMR.  Records reviewed and summarized above.   ROS 10 point system reviewed all negative except HPI  Physical Exam: Constitutional: NAD General: frail appearing, elderly female EYES: lids intact ENMT: oral mucous membranes moist CV: S1S2, RRR Pulmonary: LCTA Abdomen: normo-active BS + 4 quadrants, soft and non tender MSK: w/c dependent Skin: warm and dry Neuro:  + generalized weakness,  + cognitive impairment Psych: non-anxious affect, A and Oriented to self, place Thank you for the opportunity to participate in the care of [name]. Please call our office at [phone] if we can be of additional assistance.   Latrell Potempa Ihor Gully, NP

## 2022-12-19 ENCOUNTER — Non-Acute Institutional Stay: Payer: Medicare Other | Admitting: Nurse Practitioner

## 2022-12-19 ENCOUNTER — Encounter: Payer: Self-pay | Admitting: Nurse Practitioner

## 2022-12-19 DIAGNOSIS — R5381 Other malaise: Secondary | ICD-10-CM

## 2022-12-19 DIAGNOSIS — F0280 Dementia in other diseases classified elsewhere without behavioral disturbance: Secondary | ICD-10-CM

## 2022-12-19 DIAGNOSIS — Z515 Encounter for palliative care: Secondary | ICD-10-CM

## 2022-12-19 NOTE — Progress Notes (Addendum)
[facility] Pall[facility] Consult Note Telephone: [phone]  Fax: [phone]    Date of encounter: 12/19/22 2:18 PM PATIENT [name] [place] [address]   [phone] (home)  DOB: [birth year] MRN: [id number] PRIMARY CARE PROVIDER:    Alvester Morin, MD,  [facility]  RESPONSIBLE PARTY:    Contact Information     Name Relation Home Work Mobile   Thompson,[name] Daughter [phone]  [phone]   Freshwater,Catherine Daughter [phone]  [phone]   Dossie Der [phone]       I met face to face with patient in facility. Palliative Care was asked to follow this patient by consultation request of  Slade-Hartman, Loretha Stapler [facility] to address advance care planning and complex medical decision making. This is a follow up visit.                                  ASSESSMENT AND PLAN / RECOMMENDATIONS:  Symptom Management/Plan: Symptom Management/Plan: 1. Advance Care Planning;  DNR 2. Goals of Care: Goals include to maximize quality of life and symptom management. Our advance care planning conversation included a discussion about:    The value and importance of advance care planning  Exploration of personal, cultural or spiritual beliefs that might influence medical decisions  Exploration of goals of care in the event of a sudden injury or illness  Identification and preparation of a healthcare agent  Review and updating or creation of an advance directive document. 3. Palliative care encounter; Palliative care encounter; Palliative medicine team will continue to support patient, patient's family, and medical team. Visit consisted of counseling and education dealing with the complex and emotionally intense issues of symptom management and palliative care in the setting of serious and potentially life-threatening illness   4. Debility secondary to dementia, continue supportive care,  encourage mobiity, oob, fall precautions. Continue discussions of chronic progression of dementia, expectations. Address symptoms and progression as disease progresses. Discussed with daughters. Support provided.  Follow up Palliative Care Visit: Palliative care will continue to follow for complex medical decision making, advance care planning, and clarification of goals. Return 4 to 8 weeks or prn.   I spent 47 minutes providing this consultation started at 1:50 pm. More than 50% of the time in this consultation was spent in counseling and care coordination. PPS: 40% Chief Complaint: Follow up palliative consult for complex medical decision making, address goals, manage ongoing symptoms   HISTORY OF PRESENT ILLNESS:  Tracy Mcmahon is a 89 y.o. year old female  with multiple medical problems including dementia, HTN, COPD, h/o DVT/PE, chronic respiratory failure with hypoxia/hypercapnia, chronic pancreatitis, DM, h/o uti's, CKD, RLS, HLD, adult FTT, h/o constipation, depression. [name] resides at [facility]. [name] requires assistance for transfers, mobility, w/c dependent. [name] does require assistance for adl's, bathing, dressing. [name] does feed herself after tray setup. [name] appetite is fair depending on what is being served. Staff endorses no recent falls, wounds, hospitalizations, infections per staff. At present [name] is sitting in the w/c, in her room with her 2 daughters with her. [name] is smiling, interactive, engaging. [name] denies any pain. [name], daughters and I talked about ros, functional abilities, appetite. We talked about daily routine. We talked about quality of life, what brings joy. Medical goals, ros, poc reviewed. Supportive visit. [name] was  cooperative with assessment. No new changes to poc. Updated staff.   We talked about medical goals, poc, medications reviewed. Will continue to follow/monitor, currently [name] is stable. Updated staff, no new  changes today recommended.    History obtained from review of EMR, discussion with primary team, and interview with family, facility staff/caregiver and/or Ms. Tracy Mcmahon.  I reviewed available labs, medications, imaging, studies and related documents from the EMR.  Records reviewed and summarized above.  Physical Exam: Constitutional: NAD General: frail appearing, elderly female EYES: lids intact ENMT: oral mucous membranes moist CV: S1S2, RRR Pulmonary: LCTA Abdomen: normo-active BS + 4 quadrants, soft and non tender MSK: w/c dependent Skin: warm and dry Neuro:  + generalized weakness,  + cognitive impairment Psych: non-anxious affect, A and Oriented to self, place Thank you for the opportunity to participate in the care of Ms. Tracy Mcmahon. Please call our office at [phone] if we can be of additional assistance.   Rylynne Schicker Ihor Gully, NP

## 2023-02-25 ENCOUNTER — Encounter: Payer: Self-pay | Admitting: Nurse Practitioner

## 2023-02-25 ENCOUNTER — Non-Acute Institutional Stay: Payer: Medicare Other | Admitting: Nurse Practitioner

## 2023-02-25 DIAGNOSIS — Z515 Encounter for palliative care: Secondary | ICD-10-CM

## 2023-02-25 DIAGNOSIS — F0280 Dementia in other diseases classified elsewhere without behavioral disturbance: Secondary | ICD-10-CM

## 2023-02-25 NOTE — Progress Notes (Signed)
[facility] Palliative Care Consult Note Telephone: [phone]  Fax: [phone]    Date of encounter: 02/25/23 3:27 PM PATIENT [name] [id number]   [phone] (home)  DOB: 03/05/1929 MRN: [id number] PRIMARY CARE PROVIDER:    Alvester Morin, MD,  [facility]  RESPONSIBLE PARTY:    Contact Information     Name Relation Home Work Mobile   Thompson,[name] Daughter [phone]  [phone]   Freshwater,Catherine Daughter [phone]  [phone]   Dossie Der [phone]          I met face to face with patient in facility. Palliative Care was asked to follow this patient by consultation request of  [facility] to address advance care planning and complex medical decision making. This is a follow up visit.                                  ASSESSMENT AND PLAN / RECOMMENDATIONS:  Symptom Management/Plan: Symptom Management/Plan: 1. Advance Care Planning;  DNR 2. Palliative care encounter; Palliative care encounter; Palliative medicine team will continue to support patient, patient's family, and medical team. Visit consisted of counseling and education dealing with the complex and emotionally intense issues of symptom management and palliative care in the setting of serious and potentially life-threatening illness   3. Dementia,  progressive/ continue supportive care, encourage mobiity, oob, fall precautions. Continue discussions of chronic progression of dementia, expectations. Address symptoms and progression as disease progresses. Discussed with daughters. Support provided.  Follow up Palliative Care Visit: Palliative care will continue to follow for complex medical decision making, advance care planning, and clarification of goals. Return 4 to 8 weeks or prn.   I spent 45 minutes providing this consultation. More than 50% of the time in this consultation was  spent in counseling and care coordination. PPS: 40% Chief Complaint: Follow up palliative consult for complex medical decision making, address goals, manage ongoing symptoms   HISTORY OF PRESENT ILLNESS:  Tracy Mcmahon is a 89 y.o. year old female  with multiple medical problems including dementia, HTN, COPD, h/o DVT/PE, chronic respiratory failure with hypoxia/hypercapnia, chronic pancreatitis, DM, h/o uti's, CKD, RLS, HLD, adult FTT, h/o constipation, depression. Tracy Mcmahon resides at [facility]. Tracy Mcmahon requires assistance for transfers, mobility, w/c dependent. Tracy Mcmahon does require assistance for adl's, bathing, dressing. Tracy Mcmahon does feed herself after tray setup. Tracy Mcmahon appetite is fair depending on what is being served. At present Tracy Mcmahon is sitting in the w/c, in the dining room with her 2 daughters with her. Tracy Drone NP Erling Cruz Student and I visited Tracy Mcmahon. Purpose of today PC f/u visit further discussion monitor trends of appetite, weights, monitor for functional, cognitive decline with chronic disease progression, assess any active symptoms, supportive role. Tracy Mcmahon is smiling, interactive, engaging. Tracy Mcmahon denies any pain. We talked about ros, recent cold symptoms she is experiencing onset the last few days. We talked about appetite as she is feeding herself lunch. No s/s aspiration. Functional/cognitive reviewed. No recent falls, wounds, hospitalizations. Currently Tracy Mcmahon appears stable. Supportive visit. Tracy Mcmahon was cooperative with assessment. No new changes to poc. Updated staff.  Medical goals, poc, medications reviewed. Will continue to follow/monitor, currently Tracy Mcmahon is stable. Updated staff, no new changes today recommended.    History obtained from review of EMR, discussion with primary  team, and interview with family, facility staff/caregiver and/or Tracy Mcmahon.  I reviewed available labs, medications, imaging, studies and related documents from the EMR.  Records  reviewed and summarized above.  Physical Exam: Constitutional: NAD General: frail appearing, elderly female, pleasant ENMT: oral mucous membranes moist CV: S1S2, RRR Pulmonary: LCTA Skin: warm and dry Neuro:  + cognitive impairment Psych: non-anxious affect, A and Oriented to self, place Thank you for the opportunity to participate in the care of Tracy Mcmahon. Please call our office at [phone] if we can be of additional assistance.   Naomy Esham Ihor Gully, NP

## 2023-04-24 ENCOUNTER — Non-Acute Institutional Stay: Payer: Medicare Other | Admitting: Nurse Practitioner

## 2023-04-24 ENCOUNTER — Encounter: Payer: Self-pay | Admitting: Nurse Practitioner

## 2023-04-24 DIAGNOSIS — F0280 Dementia in other diseases classified elsewhere without behavioral disturbance: Secondary | ICD-10-CM

## 2023-04-24 DIAGNOSIS — R5381 Other malaise: Secondary | ICD-10-CM

## 2023-04-24 DIAGNOSIS — Z515 Encounter for palliative care: Secondary | ICD-10-CM

## 2023-04-24 NOTE — Progress Notes (Signed)
[facility]rap[facility], [facility] Palliative Care Consult Note Telephone: [phone]  Fax: [phone]    Date of encounter: 04/24/23 3:04 PM PATIENT [name] [address] [place] [address]   [phone] (home)  DOB: 09-29-1929 MRN: [id number] PRIMARY CARE PROVIDER:    Keane Police, MD,  [facility]  RESPONSIBLE PARTY:    Contact Information     Name Relation Home Work Mobile   Thompson,[name] Daughter [phone]  [phone]   Freshwater,Catherine Daughter [phone]  [phone]   Harlon Ditty [phone]           I met face to face with patient in facility. Palliative Care was asked to follow this patient by consultation request of  [facility] to address advance care planning and complex medical decision making. This is a follow up visit.                                  ASSESSMENT AND PLAN / RECOMMENDATIONS:  Symptom Management/Plan: Symptom Management/Plan: 1. Advance Care Planning;  DNR 2. Palliative care encounter; Palliative care encounter; Palliative medicine team will continue to support patient, patient's family, and medical team. Visit consisted of counseling and education dealing with the complex and emotionally intense issues of symptom management and palliative care in the setting of serious and potentially life-threatening illness   3. Dementia/debility,  progressive/ continue supportive care, encourage mobiity, oob, fall precautions. Continue discussions of chronic progression of dementia, expectations. Address symptoms and progression as disease progresses. Discussed with daughters. Support provided.  04/24/2023 weight 131.6 lbs Follow up Palliative Care Visit: Palliative care will continue to follow for complex medical decision making, advance care planning, and clarification of goals. Return 2 to 8 weeks or prn.   I spent 47 minutes providing this consultation. More than 50%  of the time in this consultation was spent in counseling and care coordination. PPS: 40% Chief Complaint: Follow up palliative consult for complex medical decision making, address goals, manage ongoing symptoms   HISTORY OF PRESENT ILLNESS:  Tracy Mcmahon is a 89 y.o. year old female  with multiple medical problems including dementia, HTN, COPD, h/o DVT/PE, chronic respiratory failure with hypoxia/hypercapnia, chronic pancreatitis, DM, h/o uti's, CKD, RLS, HLD, adult FTT, h/o constipation, depression. Tracy Mcmahon resides at [facility]. Tracy Mcmahon requires assistance for transfers, mobility, w/c dependent. Tracy Mcmahon does require assistance for adl's, bathing, dressing. Tracy Mcmahon does feed herself after tray setup. Tracy Mcmahon appetite is fair depending on what is being served. At present Tracy Mcmahon is sitting in the w/c, with her 2 daughter in her room. We talked about purpose of pc visit, talked about ros, appetite which is good, talked about diet, snacks and concerns about Tracy Mcmahon eating oatmeal cookies being a DM. We talked about bringing concerns to facility, notified Manager Cala Bradford RN of concerns. We talked about daily routine, bowel regimen. We talked about daily routine, quality of life. Currently Tracy Mcmahon appears stable. Supportive visit. Tracy Mcmahon was cooperative with assessment. No new changes to poc. Updated staff.  Medical goals, poc, medications reviewed. Will continue to follow/monitor, currently Tracy Mcmahon is stable. Updated staff, no new changes today recommended.    History obtained from review of EMR, discussion with primary team, and interview with family, facility staff/caregiver and/or Tracy Mcmahon.  I reviewed available labs, medications, imaging, studies and related documents from the EMR.  Records reviewed and summarized above.  Physical Exam: General: frail appearing, elderly female, pleasant ENMT: oral mucous membranes moist CV: S1S2, RRR Pulmonary: LCTA Neuro:  + cognitive  impairment Psych: non-anxious affect, A and Oriented to self, place Thank you for the opportunity to participate in the care of Tracy Mcmahon. Please call our office at [phone] if we can be of additional assistance.   Randeep Biondolillo [place], NP
